# Patient Record
Sex: Female | Born: 1940 | Race: White | Hispanic: No | Marital: Married | Smoking: Former smoker
Health system: Southern US, Community
[De-identification: ages and names within clinical notes are randomized; demographics above are authoritative.]

## PROBLEM LIST (undated history)

## (undated) DIAGNOSIS — M353 Polymyalgia rheumatica: Secondary | ICD-10-CM

## (undated) DIAGNOSIS — D759 Disease of blood and blood-forming organs, unspecified: Secondary | ICD-10-CM

## (undated) DIAGNOSIS — R55 Syncope and collapse: Secondary | ICD-10-CM

## (undated) DIAGNOSIS — R5383 Other fatigue: Secondary | ICD-10-CM

## (undated) DIAGNOSIS — Z9289 Personal history of other medical treatment: Secondary | ICD-10-CM

## (undated) DIAGNOSIS — C801 Malignant (primary) neoplasm, unspecified: Secondary | ICD-10-CM

## (undated) DIAGNOSIS — K589 Irritable bowel syndrome without diarrhea: Secondary | ICD-10-CM

## (undated) DIAGNOSIS — I10 Essential (primary) hypertension: Secondary | ICD-10-CM

## (undated) DIAGNOSIS — J45909 Unspecified asthma, uncomplicated: Secondary | ICD-10-CM

## (undated) DIAGNOSIS — M109 Gout, unspecified: Secondary | ICD-10-CM

## (undated) DIAGNOSIS — F329 Major depressive disorder, single episode, unspecified: Secondary | ICD-10-CM

## (undated) DIAGNOSIS — N289 Disorder of kidney and ureter, unspecified: Secondary | ICD-10-CM

## (undated) DIAGNOSIS — M810 Age-related osteoporosis without current pathological fracture: Secondary | ICD-10-CM

## (undated) DIAGNOSIS — I2510 Atherosclerotic heart disease of native coronary artery without angina pectoris: Secondary | ICD-10-CM

## (undated) DIAGNOSIS — Z8719 Personal history of other diseases of the digestive system: Secondary | ICD-10-CM

## (undated) DIAGNOSIS — M1990 Unspecified osteoarthritis, unspecified site: Secondary | ICD-10-CM

## (undated) DIAGNOSIS — M48061 Spinal stenosis, lumbar region without neurogenic claudication: Secondary | ICD-10-CM

## (undated) DIAGNOSIS — F419 Anxiety disorder, unspecified: Secondary | ICD-10-CM

## (undated) DIAGNOSIS — J449 Chronic obstructive pulmonary disease, unspecified: Secondary | ICD-10-CM

## (undated) DIAGNOSIS — F32A Depression, unspecified: Secondary | ICD-10-CM

## (undated) DIAGNOSIS — D72810 Lymphocytopenia: Secondary | ICD-10-CM

## (undated) DIAGNOSIS — N183 Chronic kidney disease, stage 3 unspecified: Secondary | ICD-10-CM

## (undated) DIAGNOSIS — E7800 Pure hypercholesterolemia, unspecified: Secondary | ICD-10-CM

## (undated) DIAGNOSIS — K5792 Diverticulitis of intestine, part unspecified, without perforation or abscess without bleeding: Secondary | ICD-10-CM

## (undated) DIAGNOSIS — D509 Iron deficiency anemia, unspecified: Secondary | ICD-10-CM

## (undated) DIAGNOSIS — D72819 Decreased white blood cell count, unspecified: Secondary | ICD-10-CM

## (undated) DIAGNOSIS — N390 Urinary tract infection, site not specified: Secondary | ICD-10-CM

## (undated) DIAGNOSIS — K219 Gastro-esophageal reflux disease without esophagitis: Secondary | ICD-10-CM

## (undated) HISTORY — DX: Depression, unspecified: F32.A

## (undated) HISTORY — DX: Unspecified osteoarthritis, unspecified site: M19.90

## (undated) HISTORY — DX: Age-related osteoporosis without current pathological fracture: M81.0

## (undated) HISTORY — DX: Irritable bowel syndrome, unspecified: K58.9

## (undated) HISTORY — DX: Decreased white blood cell count, unspecified: D72.819

## (undated) HISTORY — DX: Diverticulitis of intestine, part unspecified, without perforation or abscess without bleeding: K57.92

## (undated) HISTORY — PX: TUBAL LIGATION: SHX77

## (undated) HISTORY — PX: HERNIA REPAIR: SHX51

## (undated) HISTORY — DX: Anxiety disorder, unspecified: F41.9

## (undated) HISTORY — PX: OTHER SURGICAL HISTORY: SHX169

## (undated) HISTORY — PX: NISSEN FUNDOPLICATION: SHX2091

## (undated) HISTORY — DX: Gout, unspecified: M10.9

## (undated) HISTORY — DX: Polymyalgia rheumatica: M35.3

## (undated) HISTORY — DX: Chronic kidney disease, stage 3 unspecified: N18.30

## (undated) HISTORY — DX: Other disorders of bilirubin metabolism: E80.6

## (undated) HISTORY — PX: COLONOSCOPY, ESOPHAGOGASTRODUODENOSCOPY (EGD) AND ESOPHAGEAL DILATION: SHX5781

## (undated) HISTORY — DX: Chronic kidney disease, stage 3 (moderate): N18.3

## (undated) HISTORY — DX: Major depressive disorder, single episode, unspecified: F32.9

## (undated) HISTORY — PX: EYE SURGERY: SHX253

## (undated) HISTORY — DX: Hypercalcemia: E83.52

## (undated) HISTORY — DX: Lymphocytopenia: D72.810

## (undated) HISTORY — PX: VAGINAL HYSTERECTOMY: SUR661

## (undated) HISTORY — DX: Chronic obstructive pulmonary disease, unspecified: J44.9

## (undated) HISTORY — DX: Other fatigue: R53.83

## (undated) HISTORY — DX: Unspecified asthma, uncomplicated: J45.909

## (undated) HISTORY — DX: Spinal stenosis, lumbar region without neurogenic claudication: M48.061

## (undated) HISTORY — DX: Essential (primary) hypertension: I10

---

## 1978-02-18 HISTORY — PX: APPENDECTOMY: SHX54

## 1993-02-18 HISTORY — PX: BREAST EXCISIONAL BIOPSY: SUR124

## 1998-06-01 ENCOUNTER — Other Ambulatory Visit: Admission: RE | Admit: 1998-06-01 | Discharge: 1998-06-01 | Payer: Self-pay | Admitting: *Deleted

## 1999-06-29 ENCOUNTER — Encounter: Payer: Self-pay | Admitting: Family Medicine

## 1999-06-29 ENCOUNTER — Encounter: Admission: RE | Admit: 1999-06-29 | Discharge: 1999-06-29 | Payer: Self-pay | Admitting: *Deleted

## 1999-08-14 ENCOUNTER — Encounter: Admission: RE | Admit: 1999-08-14 | Discharge: 1999-08-14 | Payer: Self-pay | Admitting: Family Medicine

## 1999-08-14 ENCOUNTER — Encounter: Payer: Self-pay | Admitting: Family Medicine

## 2000-08-29 ENCOUNTER — Encounter: Payer: Self-pay | Admitting: Family Medicine

## 2000-08-29 ENCOUNTER — Encounter: Admission: RE | Admit: 2000-08-29 | Discharge: 2000-08-29 | Payer: Self-pay | Admitting: Family Medicine

## 2000-09-09 ENCOUNTER — Ambulatory Visit (HOSPITAL_COMMUNITY): Admission: RE | Admit: 2000-09-09 | Discharge: 2000-09-09 | Payer: Self-pay | Admitting: *Deleted

## 2001-09-09 ENCOUNTER — Encounter: Admission: RE | Admit: 2001-09-09 | Discharge: 2001-09-09 | Payer: Self-pay | Admitting: Family Medicine

## 2001-09-09 ENCOUNTER — Encounter: Payer: Self-pay | Admitting: Family Medicine

## 2002-06-14 ENCOUNTER — Encounter: Payer: Self-pay | Admitting: Family Medicine

## 2002-06-14 ENCOUNTER — Encounter: Admission: RE | Admit: 2002-06-14 | Discharge: 2002-06-14 | Payer: Self-pay | Admitting: Family Medicine

## 2002-07-15 ENCOUNTER — Encounter: Payer: Self-pay | Admitting: Family Medicine

## 2002-07-15 ENCOUNTER — Encounter: Admission: RE | Admit: 2002-07-15 | Discharge: 2002-07-15 | Payer: Self-pay | Admitting: Family Medicine

## 2002-08-26 ENCOUNTER — Encounter: Admission: RE | Admit: 2002-08-26 | Discharge: 2002-08-26 | Payer: Self-pay | Admitting: Family Medicine

## 2002-08-26 ENCOUNTER — Encounter: Payer: Self-pay | Admitting: Family Medicine

## 2002-10-14 ENCOUNTER — Encounter: Admission: RE | Admit: 2002-10-14 | Discharge: 2002-10-14 | Payer: Self-pay | Admitting: Family Medicine

## 2002-10-14 ENCOUNTER — Encounter: Payer: Self-pay | Admitting: Family Medicine

## 2002-10-27 ENCOUNTER — Encounter: Admission: RE | Admit: 2002-10-27 | Discharge: 2002-12-15 | Payer: Self-pay | Admitting: Orthopedic Surgery

## 2003-07-01 ENCOUNTER — Encounter: Admission: RE | Admit: 2003-07-01 | Discharge: 2003-07-01 | Payer: Self-pay | Admitting: Family Medicine

## 2003-12-19 ENCOUNTER — Encounter: Admission: RE | Admit: 2003-12-19 | Discharge: 2003-12-19 | Payer: Self-pay | Admitting: Family Medicine

## 2005-02-04 ENCOUNTER — Encounter: Admission: RE | Admit: 2005-02-04 | Discharge: 2005-02-04 | Payer: Self-pay | Admitting: Family Medicine

## 2006-06-04 ENCOUNTER — Encounter: Admission: RE | Admit: 2006-06-04 | Discharge: 2006-06-04 | Payer: Self-pay | Admitting: Family Medicine

## 2006-06-18 ENCOUNTER — Ambulatory Visit (HOSPITAL_COMMUNITY): Admission: RE | Admit: 2006-06-18 | Discharge: 2006-06-18 | Payer: Self-pay | Admitting: *Deleted

## 2007-04-08 ENCOUNTER — Encounter: Admission: RE | Admit: 2007-04-08 | Discharge: 2007-04-08 | Payer: Self-pay | Admitting: Rheumatology

## 2007-07-07 ENCOUNTER — Encounter: Admission: RE | Admit: 2007-07-07 | Discharge: 2007-07-07 | Payer: Self-pay | Admitting: Family Medicine

## 2007-11-12 ENCOUNTER — Encounter: Admission: RE | Admit: 2007-11-12 | Discharge: 2007-11-12 | Payer: Self-pay | Admitting: Family Medicine

## 2008-04-04 ENCOUNTER — Encounter: Admission: RE | Admit: 2008-04-04 | Discharge: 2008-04-04 | Payer: Self-pay | Admitting: Family Medicine

## 2008-04-04 ENCOUNTER — Inpatient Hospital Stay (HOSPITAL_COMMUNITY): Admission: EM | Admit: 2008-04-04 | Discharge: 2008-04-08 | Payer: Self-pay | Admitting: Emergency Medicine

## 2008-07-28 ENCOUNTER — Encounter: Admission: RE | Admit: 2008-07-28 | Discharge: 2008-07-28 | Payer: Self-pay | Admitting: Family Medicine

## 2009-08-01 ENCOUNTER — Encounter: Admission: RE | Admit: 2009-08-01 | Discharge: 2009-08-01 | Payer: Self-pay | Admitting: Family Medicine

## 2009-11-28 ENCOUNTER — Encounter: Admission: RE | Admit: 2009-11-28 | Discharge: 2009-11-28 | Payer: Self-pay | Admitting: Family Medicine

## 2010-03-11 ENCOUNTER — Encounter: Payer: Self-pay | Admitting: Family Medicine

## 2010-06-05 LAB — COMPREHENSIVE METABOLIC PANEL
AST: 23 U/L (ref 0–37)
Albumin: 3.4 g/dL — ABNORMAL LOW (ref 3.5–5.2)
BUN: 24 mg/dL — ABNORMAL HIGH (ref 6–23)
Calcium: 8.9 mg/dL (ref 8.4–10.5)
Creatinine, Ser: 1.07 mg/dL (ref 0.4–1.2)
GFR calc Af Amer: >60 mL/min (ref >60)
GFR calc non Af Amer: 51 mL/min — ABNORMAL LOW (ref >60)
Total Bilirubin: 0.6 mg/dL (ref 0.3–1.2)

## 2010-06-05 LAB — DIFFERENTIAL
Basophils Absolute: 0 10*3/uL (ref 0.0–0.1)
Basophils Relative: 0 % (ref 0–1)
Lymphocytes Relative: 13 % (ref 12–46)
Neutro Abs: 7.6 10*3/uL (ref 1.7–7.7)
Neutrophils Relative %: 82 % — ABNORMAL HIGH (ref 43–77)

## 2010-06-05 LAB — BASIC METABOLIC PANEL
BUN: 25 mg/dL — ABNORMAL HIGH (ref 6–23)
BUN: 22 mg/dL (ref 6–23)
CO2: 25 mEq/L (ref 19–32)
Calcium: 9.6 mg/dL (ref 8.4–10.5)
Chloride: 100 mEq/L (ref 96–112)
Creatinine, Ser: 0.97 mg/dL (ref 0.4–1.2)
Creatinine, Ser: 0.94 mg/dL (ref 0.4–1.2)
GFR calc non Af Amer: 57 mL/min — ABNORMAL LOW (ref >60)
Glucose, Bld: 114 mg/dL — ABNORMAL HIGH (ref 70–99)
Sodium: 136 mEq/L (ref 135–145)

## 2010-06-05 LAB — CBC
HCT: 31.6 % — ABNORMAL LOW (ref 36.0–46.0)
Hemoglobin: 12.9 g/dL (ref 12.0–15.0)
MCHC: 35 g/dL (ref 30.0–36.0)
MCHC: 35.4 g/dL (ref 30.0–36.0)
MCV: 96.9 fL (ref 78.0–100.0)
Platelets: 265 10*3/uL (ref 150–400)
Platelets: 219 10*3/uL (ref 150–400)
RDW: 13.6 % (ref 11.5–15.5)

## 2010-06-05 LAB — POCT CARDIAC MARKERS
CKMB, poc: <1 ng/mL — ABNORMAL LOW (ref 1.0–8.0)
Myoglobin, poc: 87.8 ng/mL (ref 12–200)
Troponin i, poc: <0.05 ng/mL (ref 0.00–0.09)

## 2010-06-05 LAB — APTT: aPTT: 31 seconds (ref 24–37)

## 2010-06-05 LAB — LIPID PANEL
LDL Cholesterol: 123 mg/dL — ABNORMAL HIGH (ref 0–99)
Triglycerides: 133 mg/dL (ref <150)
VLDL: 27 mg/dL (ref 0–40)

## 2010-06-05 LAB — HEMOGLOBIN A1C: Mean Plasma Glucose: 105 mg/dL

## 2010-06-05 LAB — BRAIN NATRIURETIC PEPTIDE: Pro B Natriuretic peptide (BNP): <30 pg/mL (ref 0.0–100.0)

## 2010-06-05 LAB — PROTIME-INR
INR: 1.1 (ref 0.00–1.49)
Prothrombin Time: 14.2 seconds (ref 11.6–15.2)

## 2010-06-13 ENCOUNTER — Other Ambulatory Visit: Payer: Self-pay | Admitting: Family Medicine

## 2010-06-13 DIAGNOSIS — M79604 Pain in right leg: Secondary | ICD-10-CM

## 2010-06-20 ENCOUNTER — Ambulatory Visit
Admission: RE | Admit: 2010-06-20 | Discharge: 2010-06-20 | Disposition: A | Discharge status: Home / Self Care | Payer: Medicare Other | Source: Ambulatory Visit | Attending: Family Medicine | Admitting: Family Medicine

## 2010-06-20 DIAGNOSIS — M79604 Pain in right leg: Secondary | ICD-10-CM

## 2010-06-29 ENCOUNTER — Other Ambulatory Visit: Payer: Self-pay | Admitting: Orthopedic Surgery

## 2010-06-29 DIAGNOSIS — M48061 Spinal stenosis, lumbar region without neurogenic claudication: Secondary | ICD-10-CM

## 2010-07-03 NOTE — Discharge Summary (Signed)
NAMEAMAL, RENBARGER             ACCOUNT NO.:  [id number]   MEDICAL RECORD NO.:  [id number]          PATIENT TYPE:  INP   LOCATION:  6524                         FACILITY:  [facility]   PHYSICIAN:  Hollice Espy, M.D.DATE OF BIRTH:  1940-07-25   DATE OF ADMISSION:  04/04/2008  DATE OF DISCHARGE:  04/08/2008                               DISCHARGE SUMMARY   PCP:  Dr. Aundra Dubin. Revankar, MD   DISCHARGE DIAGNOSES:  1. Chronic obstructive pulmonary disease exacerbation.  2. Hypoxia, now resolved.  3. Hypertension.  4. Depression.   DISCHARGE MEDICATIONS:  New medicines for this patient:  1. Prednisone 50 mg p.o. xday 1, 40 mg p.o. xday 2, and decrease by 10      mg until finished after five days.  2. Avelox 400 mg x5 days.   The patient will also continue her previous medications, and these are  as follows:  1. Advair 1 puff b.i.d.  2. Aspirin 81 mg p.o. daily.  3. Citalopram 20 p.o. daily.  4. Lisinopril/hydrochlorothiazide 10/12.5 daily.  5. Nabumetone 150 two times a day.  6. Prilosec 20 mg daily.   DISCHARGE DIET:  Low sodium.   DISPOSITION:  Improved.   ACTIVITIES:  Slow to increase.  The patient will be going home with home  health, PT/OT, and RN.   HOSPITAL COURSE:  The patient is a 70 year old white female with past  medical history of COPD and hypertension who presented with worsening  shortness of breath and cough.  Chest x-ray showed no evidence of any  infiltrate, but the patient was markedly hypoxic.  She was started on IV  antibiotics, nebulizer treatments, oxygen, and steroids.  Over the next  several days, she had dramatic improvement, and her steroids was able to  be changed over to p.o.  Her antibiotics were able to be changed over to  p.o., and by April 08, 2008, she was noted to be sating 95% on room  air.  She was still feeling very weak, and the plan will be to set up  with home health, PT/OT as well as an Char[employer] fundraiser for safety evaluation.   In regards  to her hypertension, these medications were continued and she  remained stable.  She will follow up with her PCP, Dr. Tomie China in the  next week.      Hollice Espy, [name]  Electronically Signed     SKK/MEDQ  D:  04/08/2008  T:  04/09/2008  Job:  [id number]   cc:   Aundra Dubin. Revankar, [name]

## 2010-07-03 NOTE — H&P (Signed)
Nicole Meyer, Nicole Meyer             ACCOUNT NO.:  [id number]   MEDICAL RECORD NO.:  [id number]          PATIENT TYPE:  INP   LOCATION:  1843                         FACILITY:  [facility]   PHYSICIAN:  Michiel Cowboy, MDDATE OF BIRTH:  1940/08/23   DATE OF ADMISSION:  04/04/2008  DATE OF DISCHARGE:                              HISTORY & PHYSICAL   PRIMARY CARE PHYSICIAN:  Bryan Lemma. Ehinger, [name]   CHIEF COMPLAINT:  Shortness of  breath and wheezing.  The patient is a  70 year old female with known history of COPD and hypertension who was  at her baseline up until about a week ago when she started developing  progressive wheezing and coughing, dry cough, no fevers, no chills, but  she was getting more and more significantly short of breath.  She went  to see Dr. Manus Gunning at least twice.  He started her on prednisone taper  and wrote for some antibiotics which she never filled, but she continues  to do poorly which brought her in to the emergency department where she  received nebulizer treatment with some improvement but not significant  enough to make her feel much better.  At this point [facility] hospitalists  was called for admission.   PAST MEDICAL HISTORY:  1. Significant for COPD.  2. Hypertension.  3. Mild depression.   SOCIAL HISTORY:  The patient used to smoke but quit about five years  ago.  Does not drink alcohol, does not abuse drugs.  Lives at home with  her husband who currently stopped smoking himself.   FAMILY HISTORY:  Significant for coronary artery disease and the recent  death of her father at age of 25 from MI.   ALLERGIES:  PENICILLIN.   MEDICATIONS:  1. Advair HFA 115/21 b.i.d.  2. Aspirin 81 mg daily.  3. Citalopram 20 mg daily.  4. Lisinopril/hydrochlorothiazide 10/12.5 mg p.o. daily.  5. Nabumetone 150 mg b.i.d.  6. Prilosec 20 daily.  7. The patient was recently started on prednisone taper 2 mg daily.   PHYSICAL EXAMINATION:  VITAL SIGNS:   Temperature 97, blood pressure  198/104, now down to 143/73, heart rate 84, respirations 20, saturating  96% on 2 L.  GENERAL:  The patient appears to be in no acute distress currently,  breathing and speaking in full sentences.  HEENT:  Head nontraumatic.  Slightly dry mucous membranes.  LUNGS:  Significant for expiratory coarse wheezes.  No crackles noted.  HEART:  Somewhat rapid currently.  The patient just finished a nebulizer  treatment.  No murmurs could be appreciated.  ABDOMEN:  Obese but nontender, nondistended.  EXTREMITIES:  Lower extremities without clubbing, cyanosis, or edema.  NEUROLOGIC:  Neurologically intact.   LABORATORY DATA:  White blood cell count 9.3, hemoglobin 12.9.  Sodium  136, potassium 4.5, creatinine 0.97.  Cardiac markers negative per I-  Stat.   Chest x-ray showed hiatal hernia but no cardiac pulmonary disease.  EKG  showed normal sinus rhythm with slightly flat T-waves in V2, V4, and V5.   ASSESSMENT/PLAN:  This is a 70 year old female with history of COPD,  apparently COPD  exacerbation.  Will continue with Solu-Medrol until  wheezing improves.  Put her on Avelox during duration of her symptoms.  Will make sure she is on Xopenex since Albuterol makes her tachycardic,  Xopenex p.r.n., and Atrovent.  Continue Advair at home dose.  1. History of hypertension.  The patient appears to be slightly on the      dry side.  Hold her hydrochlorothiazide.  Will continue lisinopril.      Give hydralazine p.r.n. severe blood pressure.  2. Depression.  Continue citalopram.  3. Shortness of breath.  Likely chronic obstructive pulmonary disease      related but to be complete we will obtain D-dimer.  4. Prophylaxis.  Protonix plus Lovenox.      Michiel Cowboy, MD  Electronically Signed     AVD/MEDQ  D:  04/04/2008  T:  04/04/2008  Job:  [id number]   cc:   Bryan Lemma. Manus Gunning, [name]

## 2010-07-04 ENCOUNTER — Ambulatory Visit
Admission: RE | Admit: 2010-07-04 | Discharge: 2010-07-04 | Disposition: A | Discharge status: Home / Self Care | Payer: Medicare Other | Source: Ambulatory Visit | Attending: Orthopedic Surgery | Admitting: Orthopedic Surgery

## 2010-07-04 DIAGNOSIS — M48061 Spinal stenosis, lumbar region without neurogenic claudication: Secondary | ICD-10-CM

## 2010-07-06 NOTE — Cardiovascular Report (Signed)
[place]  Patient:    Nicole Meyer, Nicole Meyer                      MRN: [id number] Proc. Date: 09/09/00 Attending:  Myriam Jacobson A. Fraser Din, [name] CC:         Dyanne Carrel, [name], [facility]   Cardiac Catheterization   REFERRING PHYSICIAN:  Dyanne Carrel, [name]  INDICATIONS FOR PROCEDURE:  Chest pain with an abnormal Cardiolite.  DESCRIPTION OF PROCEDURE:  After obtaining written informed consent, the patient was brought to the cardiac catheterization lab in the postabsorptive state.  Preoperative sedation was achieved using Valium p.o.[name]  The right groin was prepped and draped in a usual sterile fashion.  Local anesthesia was achieved using 1% Xylocaine.  A 6 French hemostasis sheath was placed into the right femoral artery using a modified Seldinger technique.  Selective coronary angiography was performed using a JL4, JR4 Judkins catheter.  Nonionic contrast was used.  All catheter exchange were made over a guide wire. following the procedure there was no identifiable coronary artery disease. The patient was transferred to the holding area.  Hemostasis was achieved using digital pressure.  FINDINGS:  Aortic pressure is 148/80, LV pressure is 147/19.  There was no gradient noted on pullback.  Single plane ventriculogram revealed normal wall motion with an ejection fraction of 60%.  There was no mitral regurgitation noted.  CORONARY ANGIOGRAPHY:  There was no significant calcification.  The left main coronary artery bifurcation into the left anterior descending and circumflex vessel.  There was no significant disease in the left main coronary artery.  Left anterior descending:  The left anterior descending gave rise to a moderate diagonal #1 and ended as an apical recurrent branch.  There was no significant disease in the left anterior descending or its branches.  Circumflex vessel:  The circumflex vessel gave rise to a small  OM-1, large bifurcating OM-2 and a OM-3.  There was no significant disease in the circumflex or its branches.  Right coronary artery:  The right coronary artery was a large dominant artery that gave rise to two small RV marginals, PA and PL branch.  IMPRESSION:  Normal coronary angiography.  Normal single plane ventriculogram. False-positive stress test.  Other etiologies will be considered for her chest pain. DD:  09/09/00 TD:  09/09/00 Job: [id number]

## 2010-07-25 ENCOUNTER — Other Ambulatory Visit: Payer: Self-pay | Admitting: Family Medicine

## 2010-07-25 DIAGNOSIS — Z1231 Encounter for screening mammogram for malignant neoplasm of breast: Secondary | ICD-10-CM

## 2010-08-08 ENCOUNTER — Ambulatory Visit
Admission: RE | Admit: 2010-08-08 | Discharge: 2010-08-08 | Disposition: A | Discharge status: Home / Self Care | Payer: Medicare Other | Source: Ambulatory Visit | Attending: Family Medicine | Admitting: Family Medicine

## 2010-08-08 DIAGNOSIS — Z1231 Encounter for screening mammogram for malignant neoplasm of breast: Secondary | ICD-10-CM

## 2011-07-16 ENCOUNTER — Other Ambulatory Visit: Payer: Self-pay | Admitting: Family Medicine

## 2011-07-16 DIAGNOSIS — Z1231 Encounter for screening mammogram for malignant neoplasm of breast: Secondary | ICD-10-CM

## 2011-08-09 ENCOUNTER — Ambulatory Visit
Admission: RE | Admit: 2011-08-09 | Discharge: 2011-08-09 | Disposition: A | Discharge status: Home / Self Care | Payer: Medicare Other | Source: Ambulatory Visit | Attending: Family Medicine | Admitting: Family Medicine

## 2011-08-09 DIAGNOSIS — Z1231 Encounter for screening mammogram for malignant neoplasm of breast: Secondary | ICD-10-CM

## 2011-08-12 ENCOUNTER — Telehealth: Payer: Self-pay | Admitting: Oncology

## 2011-08-12 NOTE — Telephone Encounter (Signed)
called pts home scheduled appt for 07/08. faxed over aq letter to Dr. Manus Gunning with appt d/t

## 2011-08-13 ENCOUNTER — Telehealth: Payer: Self-pay | Admitting: Oncology

## 2011-08-13 NOTE — Telephone Encounter (Signed)
Referred by Dr. Blair Heys Dx- Anemia/Leukocytosis

## 2011-08-23 ENCOUNTER — Encounter: Payer: Self-pay | Admitting: Oncology

## 2011-08-23 DIAGNOSIS — D72810 Lymphocytopenia: Secondary | ICD-10-CM

## 2011-08-23 DIAGNOSIS — J45909 Unspecified asthma, uncomplicated: Secondary | ICD-10-CM | POA: Insufficient documentation

## 2011-08-23 DIAGNOSIS — R5382 Chronic fatigue, unspecified: Secondary | ICD-10-CM | POA: Insufficient documentation

## 2011-08-23 DIAGNOSIS — M1990 Unspecified osteoarthritis, unspecified site: Secondary | ICD-10-CM | POA: Insufficient documentation

## 2011-08-23 DIAGNOSIS — D589 Hereditary hemolytic anemia, unspecified: Secondary | ICD-10-CM | POA: Insufficient documentation

## 2011-08-23 DIAGNOSIS — K589 Irritable bowel syndrome without diarrhea: Secondary | ICD-10-CM | POA: Insufficient documentation

## 2011-08-23 DIAGNOSIS — D72819 Decreased white blood cell count, unspecified: Secondary | ICD-10-CM | POA: Insufficient documentation

## 2011-08-23 DIAGNOSIS — F419 Anxiety disorder, unspecified: Secondary | ICD-10-CM | POA: Insufficient documentation

## 2011-08-23 DIAGNOSIS — I10 Essential (primary) hypertension: Secondary | ICD-10-CM | POA: Insufficient documentation

## 2011-08-23 HISTORY — DX: Lymphocytopenia: D72.810

## 2011-08-26 ENCOUNTER — Other Ambulatory Visit (HOSPITAL_BASED_OUTPATIENT_CLINIC_OR_DEPARTMENT_OTHER): Payer: Medicare Other | Admitting: Lab

## 2011-08-26 ENCOUNTER — Ambulatory Visit: Payer: Medicare Other

## 2011-08-26 ENCOUNTER — Ambulatory Visit (HOSPITAL_BASED_OUTPATIENT_CLINIC_OR_DEPARTMENT_OTHER): Payer: Medicare Other | Admitting: Oncology

## 2011-08-26 ENCOUNTER — Encounter: Payer: Self-pay | Admitting: Oncology

## 2011-08-26 VITALS — BP 155/66 | HR 79 | Temp 97.8°F | Ht 63.0 in | Wt 197.6 lb

## 2011-08-26 DIAGNOSIS — R5383 Other fatigue: Secondary | ICD-10-CM

## 2011-08-26 DIAGNOSIS — I10 Essential (primary) hypertension: Secondary | ICD-10-CM

## 2011-08-26 DIAGNOSIS — D72810 Lymphocytopenia: Secondary | ICD-10-CM

## 2011-08-26 DIAGNOSIS — F419 Anxiety disorder, unspecified: Secondary | ICD-10-CM

## 2011-08-26 DIAGNOSIS — J45909 Unspecified asthma, uncomplicated: Secondary | ICD-10-CM

## 2011-08-26 DIAGNOSIS — D649 Anemia, unspecified: Secondary | ICD-10-CM

## 2011-08-26 DIAGNOSIS — D72819 Decreased white blood cell count, unspecified: Secondary | ICD-10-CM

## 2011-08-26 DIAGNOSIS — M1990 Unspecified osteoarthritis, unspecified site: Secondary | ICD-10-CM

## 2011-08-26 DIAGNOSIS — K589 Irritable bowel syndrome without diarrhea: Secondary | ICD-10-CM

## 2011-08-26 LAB — CBC & DIFF AND RETIC
BASO%: 0.5 % (ref 0.0–2.0)
HCT: 27.1 % — ABNORMAL LOW (ref 34.8–46.6)
LYMPH%: 17.8 % (ref 14.0–49.7)
MCH: 33.1 pg (ref 25.1–34.0)
MCHC: 32.8 g/dL (ref 31.5–36.0)
MCV: 100.7 fL (ref 79.5–101.0)
MONO%: 7.7 % (ref 0.0–14.0)
NEUT%: 68.2 % (ref 38.4–76.8)
Platelets: 209 10*3/uL (ref 145–400)
RBC: 2.69 10*6/uL — ABNORMAL LOW (ref 3.70–5.45)
Retic %: 6.98 % — ABNORMAL HIGH (ref 0.70–2.10)

## 2011-08-26 LAB — MORPHOLOGY: PLT EST: ADEQUATE

## 2011-08-26 NOTE — Patient Instructions (Signed)
1.  Issues:  Anemia and leukopenia (low red blood cell and white blood cell). 2.  Potential causes:  Iron deficiency, Vit B12 deficiency, hemolysis (breakage of red blood cells).   Cannot rule out an early bone marrow process. 3.  Work up:  Pending iron, Vit B12 levels and others.  If blood tests do not give an obvious explanation for your anemia, we may need to perform bone marrow biopsy.

## 2011-08-26 NOTE — Progress Notes (Signed)
[facility]  [phone] [phone]     INITIAL HEMATOLOGY CONSULTATION    Referral MD:  Dr. Blair Heys, [name]   Reason for Referral:  Normocytic anemia.     HPI:  [name] is a 89 year woman with history of HTN, IBS, COPD from past smoking.  She has been noted to have worsening anemia over the past 3 years.  Her oldest CBC available on EPIC dated back to 04/04/2008 which showed Hgb of 12.9.  On 07/15/2011, her Hgb ws 11.4.  On 08/08/2011, her Hgb was 8.8.  Her last colonoscopy with [facility] was within 3 years ago and was grossly negative per report. I am awaiting the formal report. She also had recent stool Guaiac cards that were negative.  Given the persistent normocytic anemia, she was kindly referred to the cancer Center for evaluation.  Nicole Meyer presented to clinic today with her husband.  She reports mild fatigue that is been going on for many years which has not exacerbated lately. She also has dyspnea exertion source of breath, COPD. This symptoms improve with inhalers or NEB treatment at home.  She denied all visible source of bleeding. She has chronic venous stasis ulcer in her bilateral ankles.  She has chronic bone pain in the low back which has been ongoing for many years which has been attributed to osteoarthritis her rheumatologist.  Patient denies fever, anorexia, weight loss, headache, visual changes, confusion, drenching night sweats, palpable lymph node swelling, mucositis, odynophagia, dysphagia, nausea vomiting, jaundice, chest pain, palpitation, productive cough, gum bleeding, epistaxis, hematemesis, hemoptysis, abdominal pain, abdominal swelling, early satiety, melena, hematochezia, hematuria, skin rash, spontaneous bleeding, joint swelling, joint pain, heat or cold intolerance, bowel bladder incontinence, back pain, focal motor weakness, paresthesia, depression, suicidal or homocidal ideation, feeling  hopelessness.  Past Medical History  Diagnosis Date  . Anemia   . Leukocytopenia   . HTN (hypertension)   . Asthma   . IBS (irritable bowel syndrome)   . Arthritis   . Anxiety   . Depression   . Chronic kidney disease (CKD), stage III (moderate)   . Hypercalcemia   . Fatigue   . Lymphopenia 08/23/2011  . COPD (chronic obstructive pulmonary disease)   :    Past Surgical History  Procedure Date  . Abdominal hysterectomy   :   CURRENT MEDS: Current Outpatient Prescriptions  Medication Sig Dispense Refill  . albuterol (PROVENTIL) (2.5 MG/3ML) 0.083% nebulizer solution Take 2.5 mg by nebulization every 6 (six) hours as needed.      [name] aspirin 81 MG tablet Take 81 mg by mouth daily.      . budesonide (PULMICORT) 0.5 MG/2ML nebulizer solution Take 0.5 mg by nebulization 2 (two) times daily.      . Calcium Carb-Cholecalciferol (CALCIUM + D3) 600-200 MG-UNIT TABS Take 1 tablet by mouth daily.      . citalopram (CELEXA) 20 MG tablet Take 20 mg by mouth daily.      . diclofenac (VOLTAREN) 75 MG EC tablet Take 75 mg by mouth 2 (two) times daily.      [name] lisinopril-hydrochlorothiazide (PRINZIDE,ZESTORETIC) 20-12.5 MG per tablet Take 1 tablet by mouth daily.      . Multiple Vitamin (MULTIVITAMIN) tablet Take 1 tablet by mouth daily.      [name] omeprazole (PRILOSEC) 20 MG capsule Take 20 mg by mouth daily.          Allergies  Allergen Reactions  .  Brovana (Arformoterol) Other (See Comments)  . Penicillins Hives  . Paxil (Paroxetine Hcl) Rash  :  Family History  Problem Relation Age of Onset  . Diabetes Father   . Heart disease Father   . Cancer Maternal Aunt     Breast cancer  :  History   Social History  . Marital Status: Married    Spouse Name: N/A    Number of Children: 2  . Years of Education: N/A   Occupational History  .      retired Armed forces training and education officer   Social History Main Topics  . Smoking status: Former Smoker -- 1.0 packs/day for 40 years    Quit date:  02/19/2000  . Smokeless tobacco: Not on file  . Alcohol Use: No  . Drug Use: No  . Sexually Active:    Other Topics Concern  . Not on file   Social History Narrative  . No narrative on file  :  REVIEW OF SYSTEM:  The rest of the 14-point review of sytem was negative.   Exam: ECOG 1-2  General:  well-nourished in no acute distress.  Eyes:  no scleral icterus.  ENT:  There were no oropharyngeal lesions.  Neck was without thyromegaly.  Lymphatics:  Negative cervical, supraclavicular or axillary adenopathy.  Respiratory: lungs were clear bilaterally without wheezing or crackles.  Cardiovascular:  Regular rate and rhythm, S1/S2, without murmur, rub or gallop.  There was no pedal edema.  GI:  abdomen was soft, flat, nontender, nondistended, without organomegaly. She deferred rectal exam due to recent stool cards with her PCP which were all negative. Muscoloskeletal:  no spinal tenderness of palpation of vertebral spine.  Skin exam was without echymosis, petichae.  Neuro exam was nonfocal.  Patient was able to get on and off exam table without assistance.  Gait was normal.  Patient was alerted and oriented.  Attention was good.   Language was appropriate.  Mood was normal without depression.  Speech was not pressured.  Thought content was not tangential.    LABS:  Lab Results  Component Value Date   WBC 3.7* 08/26/2011   HGB 8.9* 08/26/2011   HCT 27.1* 08/26/2011   PLT 209 08/26/2011   GLUCOSE 151* 04/07/2008   CHOL  Value: 188        ATP III CLASSIFICATION:  <200     mg/dL   Desirable  161-096  mg/dL   Borderline High  >=045    mg/dL   High        05/27/8117   TRIG 133 04/05/2008   HDL 38* 04/05/2008   LDLCALC  Value: 123        Total Cholesterol/HDL:CHD Risk Coronary Heart Disease Risk Table                     Men   Women  1/2 Average Risk   3.4   3.3  Average Risk       5.0   4.4  2 X Average Risk   9.6   7.1  3 X Average Risk  23.4   11.0        Use the calculated Patient Ratio above and the CHD  Risk Table to determine the patient's CHD Risk.        ATP III CLASSIFICATION (LDL):  <100     mg/dL   Optimal  147-829  mg/dL   Near or Above  Optimal  130-159  mg/dL   Borderline  161-096  mg/dL   High  >045     mg/dL   Very High* 05/27/8117   ALT 23 04/05/2008   AST 23 04/05/2008   NA 134* 04/07/2008   K 4.5 04/07/2008   CL 100 04/07/2008   CREATININE 0.94 04/07/2008   BUN 22 04/07/2008   CO2 25 04/07/2008   INR 1.1 04/05/2008   HGBA1C  Value: 5.3 (NOTE)   The ADA recommends the following therapeutic goal for glycemic   control related to Hgb A1C measurement:   Goal of Therapy:   < 7.0% Hgb A1C   Reference: American Diabetes Association: Clinical Practice   Recommendations 2008, Diabetes Care,  2008, 31:(Suppl 1). 04/05/2008    Blood smear review:   I personally reviewed the patient's peripheral blood smear today.  There was anisocytosis.  There was no peripheral blast.  There was no schistocytosis, spherocytosis, target cell, rouleaux formation, tear drop cell.  There was no nucleated RBC but slight increase in hyperchromasia.  There was no giant platelets or platelet clumps.      ASSESSMENT AND PLAN:   1.  COPD:  From past smoking, none now.  Her COPD is well controlled on inhalers per PCP.   2.  Depression/axiety:  Well controlled on Celexa per PCP.   3.  Hypertension:  Slightly elevated BP today; but it was the first time she presented at the [facility].  She is on Lisinopril/HCTZ per PCP.   4.  Leukopenia/lymphopenia:  Very slight without neutropenia and recurrent infection.  I sent for HIV today.  Other work up as with anemia.  Please see next item.  5.  Normocytic anemia with reportedly negative GI work up. - Differential:  Vit B12 in combination with iron deficiency.  Even though her retic was slightly elevated, she did not have jaundice.  I sent for LDH, hemolysis work up.  Most likely she does not have pure red cell aplasia with appropriately elevated  reticulocytosis.  I cannot rule out myeloma and primary bone marrow process such as MDS.  - Work up:  I am ruling out reversible causes of anemia with iron, VitB12, hemolysis work up, myeloma.  If these are all negative, I strongly recommended bone marrow biopsy given leukopenia and anemia to rule out MDS.   Nicole Meyer and her husband expressed informed understanding and wished to proceed as recommended.     Thank you for this referral.   The length of time of the face-to-face encounter was 45 minutes. More than 50% of time was spent counseling and coordination of care.

## 2011-08-28 LAB — BASIC METABOLIC PANEL
CO2: 24 mEq/L (ref 19–32)
Calcium: 9 mg/dL (ref 8.4–10.5)
Creatinine, Ser: 0.98 mg/dL (ref 0.50–1.10)
Sodium: 137 mEq/L (ref 135–145)

## 2011-08-28 LAB — PROTEIN ELECTROPHORESIS, SERUM
Albumin ELP: 54.4 % — ABNORMAL LOW (ref 55.8–66.1)
Alpha-1-Globulin: 4.8 % (ref 2.9–4.9)
Beta 2: 5.2 % (ref 3.2–6.5)
Total Protein, Serum Electrophoresis: 6.8 g/dL (ref 6.0–8.3)

## 2011-08-28 LAB — KAPPA/LAMBDA LIGHT CHAINS
Kappa free light chain: 3.23 mg/dL — ABNORMAL HIGH (ref 0.33–1.94)
Kappa:Lambda Ratio: 1.08 (ref 0.26–1.65)
Lambda Free Lght Chn: 2.98 mg/dL — ABNORMAL HIGH (ref 0.57–2.63)

## 2011-08-28 LAB — IRON AND TIBC: Iron: 100 ug/dL (ref 42–145)

## 2011-08-28 LAB — LACTATE DEHYDROGENASE: LDH: 180 U/L (ref 94–250)

## 2011-08-28 LAB — FERRITIN: Ferritin: 84 ng/mL (ref 10–291)

## 2011-08-29 ENCOUNTER — Telehealth: Payer: Self-pay | Admitting: Oncology

## 2011-08-29 ENCOUNTER — Telehealth: Payer: Self-pay | Admitting: *Deleted

## 2011-08-29 ENCOUNTER — Other Ambulatory Visit: Payer: Self-pay | Admitting: Oncology

## 2011-08-29 ENCOUNTER — Encounter: Payer: Self-pay | Admitting: Oncology

## 2011-08-29 DIAGNOSIS — D72819 Decreased white blood cell count, unspecified: Secondary | ICD-10-CM

## 2011-08-29 DIAGNOSIS — D649 Anemia, unspecified: Secondary | ICD-10-CM

## 2011-08-29 NOTE — Telephone Encounter (Signed)
Pt called states returning call about scheduling a bone marrow biopsy.  Instructed pt to call Radiology dept tomorrow regarding setting that appt was ordered to be done in IR and call Dr. Lodema Pilot nurse back if any problems. She verbalized understanding.

## 2011-08-29 NOTE — Telephone Encounter (Signed)
I called and discussed with patient result of her anemia work up.  So far, none of the blood test showed an obvious explanation for her anemia and mild leukopenia.  I strongly recommended diagnostic bone marrow biopsy to rule out MDS and other bone marrow failure-related pathology.  Nicole Meyer expressed informed understanding and preferred to have this done by IR.  I made the referral to IR today for within 2 weeks.

## 2011-08-29 NOTE — Progress Notes (Signed)
Patient and husband came by today to pick up EPP Application patient will either mail back in or bring back by.

## 2011-08-30 ENCOUNTER — Other Ambulatory Visit: Payer: Self-pay | Admitting: Radiology

## 2011-08-30 ENCOUNTER — Telehealth: Payer: Self-pay | Admitting: Oncology

## 2011-08-30 NOTE — Telephone Encounter (Signed)
S/w tina and she stated that the pt is aware of the bone marrow appt

## 2011-09-02 ENCOUNTER — Encounter (HOSPITAL_COMMUNITY): Payer: Self-pay | Admitting: Pharmacy Technician

## 2011-09-03 ENCOUNTER — Encounter (HOSPITAL_COMMUNITY): Payer: Self-pay

## 2011-09-03 ENCOUNTER — Ambulatory Visit (HOSPITAL_COMMUNITY)
Admission: RE | Admit: 2011-09-03 | Discharge: 2011-09-03 | Disposition: A | Discharge status: Home / Self Care | Payer: Medicare Other | Source: Ambulatory Visit | Attending: Oncology | Admitting: Oncology

## 2011-09-03 DIAGNOSIS — D72819 Decreased white blood cell count, unspecified: Secondary | ICD-10-CM | POA: Insufficient documentation

## 2011-09-03 DIAGNOSIS — D649 Anemia, unspecified: Secondary | ICD-10-CM | POA: Insufficient documentation

## 2011-09-03 LAB — APTT: aPTT: 41 seconds — ABNORMAL HIGH (ref 24–37)

## 2011-09-03 LAB — CBC
MCHC: 34 g/dL (ref 30.0–36.0)
Platelets: 249 10*3/uL (ref 150–400)
RDW: 14.2 % (ref 11.5–15.5)
WBC: 4.2 10*3/uL (ref 4.0–10.5)

## 2011-09-03 LAB — PROTIME-INR: INR: 1.01 (ref 0.00–1.49)

## 2011-09-03 MED ORDER — FENTANYL CITRATE 0.05 MG/ML IJ SOLN
INTRAMUSCULAR | Status: AC
  Filled 2011-09-03: qty 6

## 2011-09-03 MED ORDER — HYDROCODONE-ACETAMINOPHEN 5-325 MG PO TABS
ORAL_TABLET | ORAL | Status: AC
  Administered 2011-09-03: 12:00:00
  Filled 2011-09-03: qty 1

## 2011-09-03 MED ORDER — MIDAZOLAM HCL 5 MG/5ML IJ SOLN
INTRAMUSCULAR | Status: AC | PRN
  Administered 2011-09-03: 2 mg via INTRAVENOUS

## 2011-09-03 MED ORDER — FENTANYL CITRATE 0.05 MG/ML IJ SOLN
INTRAMUSCULAR | Status: AC | PRN
  Administered 2011-09-03 (×2): 50 ug via INTRAVENOUS

## 2011-09-03 MED ORDER — HYDROCODONE-ACETAMINOPHEN 5-325 MG PO TABS
1.0000 | ORAL_TABLET | ORAL | Status: DC | PRN
  Filled 2011-09-03: qty 2

## 2011-09-03 MED ORDER — MIDAZOLAM HCL 2 MG/2ML IJ SOLN
INTRAMUSCULAR | Status: AC
  Filled 2011-09-03: qty 2

## 2011-09-03 MED ORDER — MIDAZOLAM HCL 2 MG/2ML IJ SOLN
INTRAMUSCULAR | Status: AC
  Filled 2011-09-03: qty 4

## 2011-09-03 MED ORDER — SODIUM CHLORIDE 0.9 % IV SOLN: Freq: Once | INTRAVENOUS | Status: DC

## 2011-09-03 NOTE — H&P (Signed)
Nicole Meyer is an 71 y.o. female.   Chief Complaint: worsening anemia x 2- 3 yrs Scheduled now for Bone Marrow Biopsy HPI: Leukocytopenia; HTN; COPD  Past Medical History  Diagnosis Date  . Anemia   . Leukocytopenia   . HTN (hypertension)   . Asthma   . IBS (irritable bowel syndrome)   . Arthritis   . Anxiety   . Depression   . Chronic kidney disease (CKD), stage III (moderate)   . Hypercalcemia   . Fatigue   . Lymphopenia 08/23/2011  . COPD (chronic obstructive pulmonary disease)     Past Surgical History  Procedure Date  . Abdominal hysterectomy     Family History  Problem Relation Age of Onset  . Diabetes Father   . Heart disease Father   . Cancer Maternal Aunt     Breast cancer   Social History:  reports that she quit smoking about 11 years ago. She does not have any smokeless tobacco history on file. She reports that she does not drink alcohol or use illicit drugs.  Allergies:  Allergies  Allergen Reactions  . Brovana (Arformoterol) Other (See Comments)  . Penicillins Hives  . Paxil (Paroxetine Hcl) Rash     (Not in a hospital admission)  Results for orders placed during the hospital encounter of 09/03/11 (from the past 48 hour(s))  APTT     Status: Abnormal   Collection Time   09/03/11  7:29 AM      Component Value Range Comment   aPTT 41 (*) 24 - 37 seconds   CBC     Status: Abnormal   Collection Time   09/03/11  7:29 AM      Component Value Range Comment   WBC 4.2  4.0 - 10.5 K/uL    RBC 2.91 (*) 3.87 - 5.11 MIL/uL    Hemoglobin 10.0 (*) 12.0 - 15.0 g/dL    HCT 13.0 (*) 86.5 - 46.0 %    MCV 101.0 (*) 78.0 - 100.0 fL    MCH 34.4 (*) 26.0 - 34.0 pg    MCHC 34.0  30.0 - 36.0 g/dL    RDW 78.4  69.6 - 29.5 %    Platelets 249  150 - 400 K/uL   PROTIME-INR     Status: Normal   Collection Time   09/03/11  7:29 AM      Component Value Range Comment   Prothrombin Time 13.5  11.6 - 15.2 seconds    INR 1.01  0.00 - 1.49    No results  found.  Review of Systems  Constitutional: Negative for fever.  Respiratory: Negative for shortness of breath.   Gastrointestinal: Negative for nausea and vomiting.  Neurological: Negative for headaches.  Endo/Heme/Allergies: Bruises/bleeds easily.    Blood pressure 150/72, pulse 76, temperature 97.8 F (36.6 C), temperature source Oral, resp. rate 19, height 5' 2.5" (1.588 m), weight 197 lb (89.359 kg), SpO2 97.00%. Physical Exam  Constitutional: She is oriented to person, place, and time. She appears well-developed and well-nourished.  Cardiovascular: Normal rate, regular rhythm and normal heart sounds.   No murmur heard. Respiratory: Effort normal and breath sounds normal. She has no wheezes.  GI: Soft. Bowel sounds are normal. There is no tenderness.  Musculoskeletal: Normal range of motion.  Neurological: She is alert and oriented to person, place, and time.  Skin: Skin is warm and dry.  Psychiatric: She has a normal mood and affect. Her behavior is normal. Judgment and thought content  normal.     Assessment/Plan Worsening anemia x 2-3 yrs Scheduled now for BM Bx Pt and family aware of procedure benefits and risks and agreeable to proceed. Consent signed.  Jru Pense A 09/03/2011, 8:37 AM

## 2011-09-03 NOTE — Progress Notes (Signed)
Charted pac access on this pt in error  Have erased it to the best of my ability  Pt did not have pac

## 2011-09-03 NOTE — Procedures (Signed)
Interventional Radiology Procedure Note  Procedure: CT guided bone marrow biopsy, right iliac Complications: No immediate Recommendations: Supine bed rest x 2 hrs, then progress to full ambulation.  Keep bandage on 48 hrs.  Signed,  Sterling Big, MD Vascular & Interventional Radiologist [facility] Radiology

## 2011-09-04 ENCOUNTER — Encounter: Payer: Self-pay | Admitting: Oncology

## 2011-09-04 NOTE — Progress Notes (Signed)
Received copy of Bone Marrow report; forwarded to Dr. Ha. 

## 2011-09-04 NOTE — Progress Notes (Signed)
Patient denied EPP financial assistance patient income for 2  [phone]

## 2011-09-23 ENCOUNTER — Telehealth: Payer: Self-pay | Admitting: *Deleted

## 2011-09-23 NOTE — Telephone Encounter (Signed)
Pt left VM states calling for her test results.  Says she was instructed to call by Dr. Gaylyn Rong if she hasn't heard anything by now.

## 2011-09-26 ENCOUNTER — Telehealth: Payer: Self-pay | Admitting: *Deleted

## 2011-09-26 NOTE — Telephone Encounter (Signed)
Pt left VM requesting Results of tests.

## 2011-11-05 ENCOUNTER — Other Ambulatory Visit: Payer: Medicare Other | Admitting: Lab

## 2011-11-07 ENCOUNTER — Other Ambulatory Visit (HOSPITAL_BASED_OUTPATIENT_CLINIC_OR_DEPARTMENT_OTHER): Payer: Medicare Other | Admitting: Lab

## 2011-11-07 ENCOUNTER — Telehealth: Payer: Self-pay | Admitting: Oncology

## 2011-11-07 ENCOUNTER — Ambulatory Visit (HOSPITAL_BASED_OUTPATIENT_CLINIC_OR_DEPARTMENT_OTHER): Payer: Medicare Other | Admitting: Oncology

## 2011-11-07 VITALS — BP 147/79 | HR 87 | Temp 97.1°F | Resp 20 | Ht 62.5 in | Wt 195.3 lb

## 2011-11-07 DIAGNOSIS — D649 Anemia, unspecified: Secondary | ICD-10-CM

## 2011-11-07 DIAGNOSIS — D472 Monoclonal gammopathy: Secondary | ICD-10-CM

## 2011-11-07 DIAGNOSIS — D72819 Decreased white blood cell count, unspecified: Secondary | ICD-10-CM

## 2011-11-07 DIAGNOSIS — J449 Chronic obstructive pulmonary disease, unspecified: Secondary | ICD-10-CM

## 2011-11-07 DIAGNOSIS — F419 Anxiety disorder, unspecified: Secondary | ICD-10-CM

## 2011-11-07 DIAGNOSIS — D72810 Lymphocytopenia: Secondary | ICD-10-CM

## 2011-11-07 DIAGNOSIS — R5383 Other fatigue: Secondary | ICD-10-CM

## 2011-11-07 DIAGNOSIS — I10 Essential (primary) hypertension: Secondary | ICD-10-CM

## 2011-11-07 DIAGNOSIS — J45909 Unspecified asthma, uncomplicated: Secondary | ICD-10-CM

## 2011-11-07 DIAGNOSIS — M1990 Unspecified osteoarthritis, unspecified site: Secondary | ICD-10-CM

## 2011-11-07 DIAGNOSIS — K589 Irritable bowel syndrome without diarrhea: Secondary | ICD-10-CM

## 2011-11-07 LAB — CBC WITH DIFFERENTIAL/PLATELET
BASO%: 0.6 % (ref 0.0–2.0)
EOS%: 4.5 % (ref 0.0–7.0)
LYMPH%: 17 % (ref 14.0–49.7)
MCH: 36.2 pg — ABNORMAL HIGH (ref 25.1–34.0)
MCHC: 35.3 g/dL (ref 31.5–36.0)
MONO#: 0.4 10*3/uL (ref 0.1–0.9)
Platelets: 217 10*3/uL (ref 145–400)
RBC: 3 10*6/uL — ABNORMAL LOW (ref 3.70–5.45)
WBC: 4.6 10*3/uL (ref 3.9–10.3)
lymph#: 0.8 10*3/uL — ABNORMAL LOW (ref 0.9–3.3)

## 2011-11-07 LAB — CHCC SMEAR

## 2011-11-07 NOTE — Telephone Encounter (Signed)
gv pt appt schedule for November 2013 and January - March - May 2014.

## 2011-11-07 NOTE — Progress Notes (Signed)
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    CURRENT THERAPY:  Oral iron.   INTERVAL HISTORY: Nicole Meyer 71 y.o. female returns for regular follow up with her husband.  She still reports fatigue. This is been going for many years.  It has not been getting worse lately. 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  She denies anorexia, weight loss, visible source of bleeding. She is been taking oral iron without much problem such as dyspepsia, abdominal pain, constipation, nausea, vomiting.   Patient denies fever, headache, visual changes, confusion, drenching night sweats, palpable lymph node swelling, mucositis, odynophagia, dysphagia, nausea vomiting, jaundice, chest pain, palpitation, shortness of breath, dyspnea on exertion, productive cough, gum bleeding, epistaxis, hematemesis, hemoptysis, abdominal pain, abdominal swelling, early satiety, melena, hematochezia, hematuria, skin rash, spontaneous bleeding, joint swelling, joint pain, heat or cold intolerance, bowel bladder incontinence, back pain, focal motor weakness, paresthesia, depression, suicidal or homicidal ideation, feeling hopelessness.   Past Medical History  Diagnosis Date  . Anemia   . Leukocytopenia   . HTN (hypertension)   . Asthma   . IBS (irritable bowel syndrome)   . Arthritis   . Anxiety   . Depression   . Chronic kidney disease (CKD), stage III (moderate)   . Hypercalcemia   . Fatigue   . Lymphopenia 08/23/2011  . COPD (chronic obstructive pulmonary disease)     Past Surgical History  Procedure Date  . Abdominal hysterectomy     Current Outpatient Prescriptions  Medication Sig Dispense Refill  . albuterol (PROVENTIL) (2.5 MG/3ML) 0.083% nebulizer solution Take 2.5 mg  by nebulization every 6 (six) hours as needed. Wheezing and shortness of breath      . aspirin 81 MG tablet Take 81 mg by mouth daily.      . Aspirin-Acetaminophen-Caffeine (EXCEDRIN PO) Take 2 capsules by mouth daily as needed.      . Calcium Carb-Cholecalciferol (CALCIUM + D3) 600-200 MG-UNIT TABS Take 1 tablet by mouth daily.      . citalopram (CELEXA) 20 MG tablet Take 20 mg by mouth daily.      [name] lisinopril-hydrochlorothiazide (PRINZIDE,ZESTORETIC) 20-12.5 MG per tablet Take 1 tablet by mouth daily with breakfast.       . Multiple Vitamin (MULTIVITAMIN) tablet Take 1 tablet by mouth daily.      [name] omeprazole (PRILOSEC) 20 MG capsule Take 20 mg by mouth daily.        ALLERGIES:  is allergic to brovana; penicillins; and paxil.  REVIEW OF SYSTEMS:  The rest of the 14-point review of system was negative.   Filed Vitals:   11/07/11 1019  BP: 147/79  Pulse: 87  Temp: 97.1 F (36.2 C)  Resp: 20   Wt Readings from Last 3 Encounters:  11/07/11 195 lb 4.8 oz (88.587 kg)  09/03/11 197 lb (89.359 kg)  08/26/11 197 lb 9.6 oz (89.631 kg)   ECOG Performance status: 1  PHYSICAL EXAMINATION:   General:  Obese woman, in no acute distress.  Eyes:  no scleral icterus.  ENT:  There were no oropharyngeal lesions.  Neck was without thyromegaly.  Lymphatics:  Negative cervical, supraclavicular or axillary adenopathy.  Respiratory: lungs were clear bilaterally without wheezing or crackles.  Cardiovascular:  Regular rate and rhythm, S1/S2, without murmur,  rub or gallop.  There was no pedal edema.  GI:  abdomen was soft, flat, nontender, nondistended, without organomegaly.  Muscoloskeletal:  no spinal tenderness of palpation of vertebral spine.  Skin exam was without echymosis, petichae.  Neuro exam was nonfocal.  Patient was able to get on and off exam table without assistance.  Gait was normal.  Patient was alerted and oriented.  Attention was good.   Language was appropriate.  Mood was normal without  depression.  Speech was not pressured.  Thought content was not tangential.      LABORATORY/RADIOLOGY DATA:  Lab Results  Component Value Date   WBC 4.6 11/07/2011   HGB 10.9* 11/07/2011   HCT 30.8* 11/07/2011   PLT 217 11/07/2011   GLUCOSE 99 08/26/2011   CHOL  Value: 188        ATP III CLASSIFICATION:  <200     mg/dL   Desirable  161-096  mg/dL   Borderline High  >=045    mg/dL   High        05/27/8117   TRIG 133 04/05/2008   HDL 38* 04/05/2008   LDLCALC  Value: 123        Total Cholesterol/HDL:CHD Risk Coronary Heart Disease Risk Table                     Men   Women  1/2 Average Risk   3.4   3.3  Average Risk       5.0   4.4  2 X Average Risk   9.6   7.1  3 X Average Risk  23.4   11.0        Use the calculated Patient Ratio above and the CHD Risk Table to determine the patient's CHD Risk.        ATP III CLASSIFICATION (LDL):  <100     mg/dL   Optimal  147-829  mg/dL   Near or Above                    Optimal  130-159  mg/dL   Borderline  562-130  mg/dL   High  >865     mg/dL   Very High* 7/84/6962   ALKPHOS 79 04/05/2008   ALT 23 04/05/2008   AST 23 04/05/2008   NA 137 08/26/2011   K 4.4 08/26/2011   CL 107 08/26/2011   CREATININE 0.98 08/26/2011   BUN 16 08/26/2011   CO2 24 08/26/2011   INR 1.01 09/03/2011   HGBA1C  Value: 5.3 (NOTE)   The ADA recommends the following therapeutic goal for glycemic   control related to Hgb A1C measurement:   Goal of Therapy:   < 7.0% Hgb A1C   Reference: American Diabetes Association: Clinical Practice   Recommendations 2008, Diabetes Care,  2008, 31:(Suppl 1). 04/05/2008    ASSESSMENT AND PLAN:    1. COPD: From past smoking, none now. Her COPD is well controlled on inhalers per PCP.  2. Depression/axiety: Well controlled on Celexa per PCP.  3. Hypertension:  She is on Lisinopril/HCTZ per PCP.  4. Leukopenia/lymphopenia: Very slight without neutropenia and recurrent infection. HIV testing was negative in 08/2011.  Her WBC has improved today.  5.   Anemia:  Unclear  reason.  Extensive work up was negative including bone marrow biopsy.  Hgb today has improved to 10.9 without intervention.  Diagnosis of exclusion is anemia of dyserythropoiesis ("weakened bone marrow from aging process" as opposed to cancer).  Even though her ferritin was not low and there was no sign of iron deficiency anemia on bone marrow biopsy, her Hgb improved on oral iron. I advised her to continue take oral iron as tolerated this time. - Recommendation:  Observation.  Follow up with blood test at the [facility] every 2 months.  We may consider transfusion when Hgb <7.  We may consider Aranesp injection when patient requires frequent blood transfusion.   Return visit in about 8 months.     The length of time of the face-to-face encounter was 15 minutes. More than 50% of time was spent counseling and coordination of care.

## 2011-11-07 NOTE — Patient Instructions (Addendum)
1.  Anemia:  Unclear reason.  Extensive work up was negative including bone marrow biopsy.  Hgb today has improved to 10.9 without intervention.  Diagnosis of exclusion is anemia of dyserythropoiesis ("weakened bone marrow from aging process" as opposed to cancer) 2.  Recommendation:  Observation.  Follow up with blood test at the [facility] every 2 months.  We may consider transfusion when Hgb <7.  We may consider Aranesp injection when patient requires frequent blood transfusion.   Return visit in about 8 months.

## 2011-12-24 ENCOUNTER — Encounter (HOSPITAL_COMMUNITY): Payer: Self-pay | Admitting: *Deleted

## 2011-12-24 ENCOUNTER — Emergency Department (HOSPITAL_COMMUNITY): Payer: Medicare Other

## 2011-12-24 ENCOUNTER — Inpatient Hospital Stay (HOSPITAL_COMMUNITY)
Admission: EM | Admit: 2011-12-24 | Discharge: 2011-12-27 | DRG: 391 | Disposition: A | Discharge status: Home / Self Care | Payer: Medicare Other | Attending: Internal Medicine | Admitting: Internal Medicine

## 2011-12-24 DIAGNOSIS — R5383 Other fatigue: Secondary | ICD-10-CM

## 2011-12-24 DIAGNOSIS — R079 Chest pain, unspecified: Secondary | ICD-10-CM | POA: Diagnosis present

## 2011-12-24 DIAGNOSIS — R1013 Epigastric pain: Secondary | ICD-10-CM

## 2011-12-24 DIAGNOSIS — I10 Essential (primary) hypertension: Secondary | ICD-10-CM | POA: Diagnosis present

## 2011-12-24 DIAGNOSIS — J449 Chronic obstructive pulmonary disease, unspecified: Secondary | ICD-10-CM

## 2011-12-24 DIAGNOSIS — M1990 Unspecified osteoarthritis, unspecified site: Secondary | ICD-10-CM

## 2011-12-24 DIAGNOSIS — K589 Irritable bowel syndrome without diarrhea: Secondary | ICD-10-CM | POA: Diagnosis present

## 2011-12-24 DIAGNOSIS — J45909 Unspecified asthma, uncomplicated: Secondary | ICD-10-CM

## 2011-12-24 DIAGNOSIS — J4489 Other specified chronic obstructive pulmonary disease: Secondary | ICD-10-CM | POA: Diagnosis present

## 2011-12-24 DIAGNOSIS — K449 Diaphragmatic hernia without obstruction or gangrene: Secondary | ICD-10-CM

## 2011-12-24 DIAGNOSIS — D72819 Decreased white blood cell count, unspecified: Secondary | ICD-10-CM

## 2011-12-24 DIAGNOSIS — F419 Anxiety disorder, unspecified: Secondary | ICD-10-CM

## 2011-12-24 DIAGNOSIS — I129 Hypertensive chronic kidney disease with stage 1 through stage 4 chronic kidney disease, or unspecified chronic kidney disease: Secondary | ICD-10-CM | POA: Diagnosis present

## 2011-12-24 DIAGNOSIS — D649 Anemia, unspecified: Secondary | ICD-10-CM

## 2011-12-24 DIAGNOSIS — N390 Urinary tract infection, site not specified: Secondary | ICD-10-CM | POA: Diagnosis present

## 2011-12-24 DIAGNOSIS — Z79899 Other long term (current) drug therapy: Secondary | ICD-10-CM

## 2011-12-24 DIAGNOSIS — R55 Syncope and collapse: Secondary | ICD-10-CM

## 2011-12-24 DIAGNOSIS — Z7982 Long term (current) use of aspirin: Secondary | ICD-10-CM

## 2011-12-24 DIAGNOSIS — K562 Volvulus: Secondary | ICD-10-CM | POA: Diagnosis present

## 2011-12-24 DIAGNOSIS — R109 Unspecified abdominal pain: Secondary | ICD-10-CM

## 2011-12-24 DIAGNOSIS — D589 Hereditary hemolytic anemia, unspecified: Secondary | ICD-10-CM | POA: Diagnosis present

## 2011-12-24 DIAGNOSIS — A498 Other bacterial infections of unspecified site: Secondary | ICD-10-CM | POA: Diagnosis present

## 2011-12-24 DIAGNOSIS — D72810 Lymphocytopenia: Secondary | ICD-10-CM

## 2011-12-24 DIAGNOSIS — N183 Chronic kidney disease, stage 3 unspecified: Secondary | ICD-10-CM | POA: Diagnosis present

## 2011-12-24 DIAGNOSIS — K440 Diaphragmatic hernia with obstruction, without gangrene: Secondary | ICD-10-CM | POA: Diagnosis present

## 2011-12-24 DIAGNOSIS — Z87891 Personal history of nicotine dependence: Secondary | ICD-10-CM

## 2011-12-24 HISTORY — DX: Urinary tract infection, site not specified: N39.0

## 2011-12-24 HISTORY — DX: Disorder of kidney and ureter, unspecified: N28.9

## 2011-12-24 HISTORY — DX: Syncope and collapse: R55

## 2011-12-24 HISTORY — DX: Iron deficiency anemia, unspecified: D50.9

## 2011-12-24 HISTORY — DX: Gastro-esophageal reflux disease without esophagitis: K21.9

## 2011-12-24 HISTORY — DX: Pure hypercholesterolemia, unspecified: E78.00

## 2011-12-24 HISTORY — DX: Personal history of other diseases of the digestive system: Z87.19

## 2011-12-24 LAB — URINALYSIS, ROUTINE W REFLEX MICROSCOPIC
Bilirubin Urine: NEGATIVE
Glucose, UA: NEGATIVE mg/dL
Hgb urine dipstick: NEGATIVE
Ketones, ur: NEGATIVE mg/dL
Nitrite: POSITIVE — AB
Protein, ur: NEGATIVE mg/dL
Specific Gravity, Urine: 1.018 (ref 1.005–1.030)
Urobilinogen, UA: 1 mg/dL (ref 0.0–1.0)
pH: 5 (ref 5.0–8.0)

## 2011-12-24 LAB — CBC WITH DIFFERENTIAL/PLATELET
Basophils Absolute: 0 K/uL (ref 0.0–0.1)
Basophils Relative: 0 % (ref 0–1)
Eosinophils Absolute: 0.1 K/uL (ref 0.0–0.7)
Eosinophils Relative: 2 % (ref 0–5)
HCT: 29 % — ABNORMAL LOW (ref 36.0–46.0)
Hemoglobin: 9.8 g/dL — ABNORMAL LOW (ref 12.0–15.0)
Lymphocytes Relative: 9 % — ABNORMAL LOW (ref 12–46)
Lymphs Abs: 0.6 K/uL — ABNORMAL LOW (ref 0.7–4.0)
MCH: 33.7 pg (ref 26.0–34.0)
MCHC: 33.8 g/dL (ref 30.0–36.0)
MCV: 99.7 fL (ref 78.0–100.0)
Monocytes Absolute: 0.4 K/uL (ref 0.1–1.0)
Monocytes Relative: 6 % (ref 3–12)
Neutro Abs: 5.5 K/uL (ref 1.7–7.7)
Neutrophils Relative %: 83 % — ABNORMAL HIGH (ref 43–77)
Platelets: 223 K/uL (ref 150–400)
RBC: 2.91 MIL/uL — ABNORMAL LOW (ref 3.87–5.11)
RDW: 13 % (ref 11.5–15.5)
WBC: 6.6 K/uL (ref 4.0–10.5)

## 2011-12-24 LAB — URINE MICROSCOPIC-ADD ON

## 2011-12-24 LAB — LACTIC ACID, PLASMA: Lactic Acid, Venous: 0.8 mmol/L (ref 0.5–2.2)

## 2011-12-24 LAB — POCT I-STAT TROPONIN I: Troponin i, poc: 0.01 ng/mL (ref 0.00–0.08)

## 2011-12-24 LAB — BASIC METABOLIC PANEL
Chloride: 104 mEq/L (ref 96–112)
GFR calc Af Amer: 56 mL/min — ABNORMAL LOW (ref >90)
GFR calc non Af Amer: 48 mL/min — ABNORMAL LOW (ref >90)
Potassium: 4.5 mEq/L (ref 3.5–5.1)
Sodium: 136 mEq/L (ref 135–145)

## 2011-12-24 LAB — HEPATIC FUNCTION PANEL
Bilirubin, Direct: 0.2 mg/dL (ref 0.0–0.3)
Indirect Bilirubin: 0.9 mg/dL (ref 0.3–0.9)
Total Protein: 7.6 g/dL (ref 6.0–8.3)

## 2011-12-24 LAB — LIPASE, BLOOD: Lipase: 64 U/L — ABNORMAL HIGH (ref 11–59)

## 2011-12-24 MED ORDER — LISINOPRIL-HYDROCHLOROTHIAZIDE 20-12.5 MG PO TABS: 1.0000 | ORAL_TABLET | Freq: Every day | ORAL | Status: DC

## 2011-12-24 MED ORDER — PANTOPRAZOLE SODIUM 40 MG IV SOLR: 40.0000 mg | INTRAVENOUS | Status: DC

## 2011-12-24 MED ORDER — ACETAMINOPHEN 325 MG PO TABS: 650.0000 mg | ORAL_TABLET | Freq: Four times a day (QID) | ORAL | Status: DC | PRN

## 2011-12-24 MED ORDER — IOHEXOL 300 MG/ML  SOLN
80.0000 mL | Freq: Once | INTRAMUSCULAR | Status: AC | PRN
  Administered 2011-12-24: 80 mL via INTRAVENOUS

## 2011-12-24 MED ORDER — ONDANSETRON HCL 4 MG/2ML IJ SOLN
4.0000 mg | Freq: Four times a day (QID) | INTRAMUSCULAR | Status: DC | PRN
  Administered 2011-12-26: 4 mg via INTRAVENOUS
  Filled 2011-12-24: qty 2

## 2011-12-24 MED ORDER — MORPHINE SULFATE 2 MG/ML IJ SOLN: 1.0000 mg | INTRAMUSCULAR | Status: DC | PRN

## 2011-12-24 MED ORDER — ACETAMINOPHEN 650 MG RE SUPP: 650.0000 mg | Freq: Four times a day (QID) | RECTAL | Status: DC | PRN

## 2011-12-24 MED ORDER — SENNOSIDES-DOCUSATE SODIUM 8.6-50 MG PO TABS
1.0000 | ORAL_TABLET | Freq: Every evening | ORAL | Status: DC | PRN
  Filled 2011-12-24: qty 1

## 2011-12-24 MED ORDER — HYDROCODONE-ACETAMINOPHEN 5-325 MG PO TABS: 1.0000 | ORAL_TABLET | ORAL | Status: DC | PRN

## 2011-12-24 MED ORDER — SODIUM CHLORIDE 0.9 % IV BOLUS (SEPSIS)
500.0000 mL | Freq: Once | INTRAVENOUS | Status: AC
  Administered 2011-12-24: 500 mL via INTRAVENOUS

## 2011-12-24 MED ORDER — SODIUM CHLORIDE 0.9 % IV SOLN
INTRAVENOUS | Status: DC
  Administered 2011-12-25 – 2011-12-27 (×2): via INTRAVENOUS

## 2011-12-24 MED ORDER — ONDANSETRON HCL 4 MG PO TABS
4.0000 mg | ORAL_TABLET | Freq: Four times a day (QID) | ORAL | Status: DC | PRN
  Filled 2011-12-24: qty 0.5

## 2011-12-24 MED ORDER — ENOXAPARIN SODIUM 40 MG/0.4ML ~~LOC~~ SOLN
40.0000 mg | SUBCUTANEOUS | Status: DC
  Administered 2011-12-25: 40 mg via SUBCUTANEOUS
  Filled 2011-12-24: qty 0.4

## 2011-12-24 MED ORDER — ALUM & MAG HYDROXIDE-SIMETH 200-200-20 MG/5ML PO SUSP: 30.0000 mL | Freq: Four times a day (QID) | ORAL | Status: DC | PRN

## 2011-12-24 MED ORDER — IOHEXOL 300 MG/ML  SOLN: 20.0000 mL | INTRAMUSCULAR | Status: AC

## 2011-12-24 MED ORDER — SODIUM CHLORIDE 0.9 % IJ SOLN
3.0000 mL | Freq: Two times a day (BID) | INTRAMUSCULAR | Status: DC
  Administered 2011-12-25 (×2): 3 mL via INTRAVENOUS

## 2011-12-24 NOTE — ED Notes (Signed)
Pt reports she was in car when she suddenly had severe mid-epigastric pain along with nausea then passed out for a few mins, husband was in car with pt. Pt denies abd pain now. Pt's husband reports pt never became sweaty or lightheaded.

## 2011-12-24 NOTE — ED Notes (Signed)
Patient denies pain and is resting comfortably.  

## 2011-12-24 NOTE — H&P (Signed)
PCP:   Thora Lance, MD   Chief Complaint:  Loss of consciousness   HPI: This is a 71 year old female who was a passenger in the car when she developed an epigastric pain. The pain was sudden onset, ranked 9/10 and sharp. She loss consciousness for approximate to 3-4 minutes. When she awoke, she had soreness in her stomach and some nausea. Her husband took her to the closest urgent care clinic, she was sent to the ER. The patient denies any chest pain, diaphoresis,  she was not post ictal, she no palpitations, no headache, no localized weakness, no seizure activity, no slurred speech. The patient does have a history of irritable syndrome and has chronic  intermittent abdominal discomfort. This however was worse than usual. Her last EGD was greater than 5 years ago. She does have heartburn for which she takes Prilosec, she takes a baby aspirin daily. She doesn't eat spicy foods or take any other NSAIDs. The patient has a recent diagnosis of anemia that is being worked up as an outpatient. She had a bone marrow biopsy but no recent colonoscopy or EGD.in the ER the patient was guaiac negative. History provided by the patient and her husband is at the bedside   Review of Systems:  The patient denies anorexia, fever, weight loss,, vision loss, decreased hearing, hoarseness, chest pain, dyspnea on exertion, peripheral edema, balance deficits, hemoptysis, melena, hematochezia, severe indigestion/heartburn, hematuria, incontinence, genital sores, muscle weakness, suspicious skin lesions, transient blindness, difficulty walking, depression, unusual weight change, abnormal bleeding, enlarged lymph nodes, angioedema, and breast masses.  Past Medical History: Past Medical History  Diagnosis Date  . Anemia   . Leukocytopenia   . HTN (hypertension)   . Asthma   . IBS (irritable bowel syndrome)   . Arthritis   . Anxiety   . Depression   . Chronic kidney disease (CKD), stage III (moderate)   .  Hypercalcemia   . Fatigue   . Lymphopenia 08/23/2011  . COPD (chronic obstructive pulmonary disease)   . Renal insufficiency    Past Surgical History  Procedure Date  . Abdominal hysterectomy     Medications: Prior to Admission medications   Medication Sig Start Date End Date Taking? Authorizing Provider  albuterol (PROVENTIL) (2.5 MG/3ML) 0.083% nebulizer solution Take 2.5 mg by nebulization every 6 (six) hours as needed. Wheezing and shortness of breath   Yes Historical Provider, MD  aspirin 81 MG tablet Take 81 mg by mouth daily.   Yes Historical Provider, MD  Calcium Carb-Cholecalciferol (CALCIUM + D3) 600-200 MG-UNIT TABS Take 1 tablet by mouth daily.   Yes Historical Provider, MD  lisinopril-hydrochlorothiazide (PRINZIDE,ZESTORETIC) 20-12.5 MG per tablet Take 1 tablet by mouth daily with breakfast.    Yes Historical Provider, MD  Multiple Vitamin (MULTIVITAMIN) tablet Take 1 tablet by mouth daily.   Yes Historical Provider, MD  omeprazole (PRILOSEC) 20 MG capsule Take 20 mg by mouth daily.   Yes Historical Provider, MD    Allergies:   Allergies  Allergen Reactions  . Brovana (Arformoterol) Other (See Comments)    "makes me nervous"  . Penicillins Hives  . Paxil (Paroxetine Hcl) Rash    Social History:  reports that she quit smoking about 11 years ago. She does not have any smokeless tobacco history on file. She reports that she does not drink alcohol or use illicit drugs.  Family History: Family History  Problem Relation Age of Onset  . Diabetes Father   . Heart disease Father   .  Cancer Maternal Aunt     Breast cancer    Physical Exam: Filed Vitals:   12/24/11 2015 12/24/11 2045 12/24/11 2115 12/24/11 2145  BP: 123/63 115/80 127/52 106/61  Pulse: 70 75 66 76  Temp:      TempSrc:      Resp: 21 22 19 19   SpO2: 98% 99% 100% 99%    General:  Alert and oriented times three, well developed and nourished, no acute distress Eyes: PERRLA, pink conjunctiva, no  scleral icterus ENT: Moist oral mucosa, neck supple, no thyromegaly Lungs: clear to ascultation, no wheeze, no crackles, no use of accessory muscles Cardiovascular: regular rate and rhythm, no regurgitation, no gallops, no murmurs. No carotid bruits, no JVD Abdomen: soft, positive BS,  mild nonspecific tenderness to palpation epigastric in location , non-distended, no organomegaly, not an acute abdomen GU: not examined Neuro: CN II - XII grossly intact, sensation intact Musculoskeletal: strength 5/5 all extremities, no clubbing, cyanosis or edema Skin: no rash, no subcutaneous crepitation, no decubitus Psych: appropriate patient   Labs on Admission:   Nicole Meyer 12/24/11 1548  NA 136  K 4.5  CL 104  CO2 21  GLUCOSE 99  BUN 26*  CREATININE 1.11*  CALCIUM 10.1  MG --  PHOS --    Basename 12/24/11 1743  AST 23  ALT 24  ALKPHOS 64  BILITOT 1.1  PROT 7.6  ALBUMIN 4.0    Basename 12/24/11 1743  LIPASE 64*  AMYLASE --    Basename 12/24/11 1548  WBC 6.6  NEUTROABS 5.5  HGB 9.8*  HCT 29.0*  MCV 99.7  PLT 223   No results found for this basename: CKTOTAL:3,CKMB:3,CKMBINDEX:3,TROPONINI:3 in the last 72 hours No components found with this basename: POCBNP:3 No results found for this basename: DDIMER:2 in the last 72 hours No results found for this basename: HGBA1C:2 in the last 72 hours No results found for this basename: CHOL:2,HDL:2,LDLCALC:2,TRIG:2,CHOLHDL:2,LDLDIRECT:2 in the last 72 hours No results found for this basename: TSH,T4TOTAL,FREET3,T3FREE,THYROIDAB in the last 72 hours No results found for this basename: VITAMINB12:2,FOLATE:2,FERRITIN:2,TIBC:2,IRON:2,RETICCTPCT:2 in the last 72 hours  Micro Results: Results for Nicole Meyer, Nicole Meyer (MRN [id number]) as of 12/24/2011 22:00  Ref. Range 12/24/2011 17:38  Color, Urine Latest Range: YELLOW  YELLOW  APPearance Latest Range: CLEAR  CLOUDY (A)  Specific Gravity, Urine Latest Range: 1.005-1.030  1.018  pH Latest  Range: 5.0-8.0  5.0  Glucose Latest Range: NEGATIVE mg/dL NEGATIVE  Bilirubin Urine Latest Range: NEGATIVE  NEGATIVE  Ketones, ur Latest Range: NEGATIVE mg/dL NEGATIVE  Protein Latest Range: NEGATIVE mg/dL NEGATIVE  Urobilinogen, UA Latest Range: 0.0-1.0 mg/dL 1.0  Nitrite Latest Range: NEGATIVE  POSITIVE (A)  Leukocytes, UA Latest Range: NEGATIVE  MODERATE (A)  Hgb urine dipstick Latest Range: NEGATIVE  NEGATIVE  Urine-Other No range found FEW YEAST  WBC, UA Latest Range: <3 WBC/hpf 11-20  RBC / HPF Latest Range: <3 RBC/hpf 0-2  Squamous Epithelial / LPF Latest Range: RARE  MANY (A)  Bacteria, UA Latest Range: RARE  MANY (A)     Radiological Exams on Admission: Dg Chest 2 View  12/24/2011  *RADIOLOGY REPORT*  Clinical Data: Loss of consciousness  CHEST - 2 VIEW  Comparison: 04/04/2008  Findings:  Unchanged cardiac silhouette and mediastinal contours.  Unchanged retrocardiac air and fluid-filled opacity compatible with a hiatal hernia.  There is mild diffuse thickening of the pulmonary interstitium.  No focal airspace opacities.  No definite pleural effusion or pneumothorax.  Unchanged bones.  IMPRESSION:  1.  No acute cardiopulmonary disease.  2.  Hiatal hernia.   Original Report Authenticated By: Tacey Ruiz, MD    Ct Abdomen Pelvis W Contrast  12/24/2011  *RADIOLOGY REPORT*  Clinical Data: Severe mid epigastric abdominal pain and nausea, after which the patient became syncopal.  CT ABDOMEN AND PELVIS WITH CONTRAST  Technique:  Multidetector CT imaging of the abdomen and pelvis was performed following the standard protocol during bolus administration of intravenous contrast.  Contrast: 80mL OMNIPAQUE IOHEXOL 300 MG/ML. Oral contrast was also administered.  Comparison: None.  Findings: Moderate sized hiatal hernia, with approximately one third of the stomach present within the chest.  Marked thickening of the wall of the visualized distal esophagus, though the wall thickening is asymmetric  and increased along the right lateral wall.  Stomach otherwise normal in appearance.  Mild wall thickening involving the terminal ileum, with fat in the wall. Remaining small bowel normal in appearance.  Scattered diverticula involving the sigmoid colon without evidence of acute diverticulitis; colon otherwise normal in appearance.  Lipoma involving the ileocecal valve.  Appendix not identified, but no pericecal inflammation. No ascites.  Normal appearing liver, spleen, pancreas, adrenal glands, and kidneys.  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  No significant lymphadenopathy.  Gallbladder unremarkable by CT.  No biliary ductal dilation.  Uterus surgically absent.  No adnexal masses or free pelvic fluid. Very small diverticulum arising from the left lateral wall of the bladder at its base.  Bone window images demonstrate degenerative changes involving the lower lumbar spine.  Visualized lung bases clear.  IMPRESSION:  1.  Moderate sized hiatal hernia.  Asymmetric wall thickening involving the visualized distal esophagus; while this may be due to esophagitis and chronic GE reflux disease, the asymmetry places neoplasm in the differential diagnosis.  Upper endoscopy is suggested in further evaluation. 2.  No acute abnormalities involving the abdomen or pelvis. 3.  Wall thickening involving the terminal ileum, with fat in the wall suggesting chronic inflammation.  No evidence of acute inflammatory changes. 4.  Very small diverticulum arising from the left lateral wall of the urinary bladder at its base. 5.  Scattered sigmoid colon diverticula without evidence of acute diverticulitis.   Original Report Authenticated By: Hulan Saas, [name]     Assessment/Plan Present on Admission:  . Syncope and collapse Abdominal pain  . IBS (irritable bowel syndrome)  patient will be brought in for 23 hour observation on telemetry Patient may benefit from an EGD, given her current symptoms along with  anemia and the findings on the CT scan. Defer to a.m. team for her GI consult. PPIs and pain meds as ordered as needed  . Anemia . HTN (hypertension) . Chronic kidney disease (CKD), stage III (moderate)  stable resume home medications  Full code DVT prophylaxis   Ramelo Oetken 12/24/2011, 10:00 PM

## 2011-12-24 NOTE — ED Provider Notes (Signed)
Patient suffered syncopal event this afternoon. Complaint of epigastric pain immediately prior syncopal event. Presently asymptomatic on exam no distress alert nontoxic abdomen soft mild tenderness at epigastrium  Doug Sou, MD 12/24/11 Rickey Primus

## 2011-12-24 NOTE — ED Notes (Signed)
Pt finished drinking contrast, CT notified 

## 2011-12-24 NOTE — ED Notes (Signed)
Pt was sitting in car, got mid abdominal pain with nausea and then passed out.  Pt was out for a few minutes.  No diabetes.  No chest pain.  Pt has sob r/t copd.  No pain now

## 2011-12-24 NOTE — ED Provider Notes (Signed)
History     CSN: [id number]  Arrival date & time 12/24/11  1529   First MD Initiated Contact with Patient 12/24/11 1652      Chief Complaint  Patient presents with  . Loss of Consciousness    (Consider location/radiation/quality/duration/timing/severity/associated sxs/prior treatment) HPI Syncopal event while riding in car with husband. He reports she was out for about 4 minutes. Prior to episode pt had severe abdominal pain. Quick return to baseline after event. Now with no abdominal pain.  Pain described as severe in intensity. The location of the patient's problem is periumbilical.  Onset was today with resolved course since that time.   Modifying factors:  none.  Associated symptoms: nausea but no vomiting.   Past Medical History  Diagnosis Date  . Anemia   . Leukocytopenia   . HTN (hypertension)   . Asthma   . IBS (irritable bowel syndrome)   . Arthritis   . Anxiety   . Depression   . Chronic kidney disease (CKD), stage III (moderate)   . Hypercalcemia   . Fatigue   . Lymphopenia 08/23/2011  . COPD (chronic obstructive pulmonary disease)     Past Surgical History  Procedure Date  . Abdominal hysterectomy     Family History  Problem Relation Age of Onset  . Diabetes Father   . Heart disease Father   . Cancer Maternal Aunt     Breast cancer    History  Substance Use Topics  . Smoking status: Former Smoker -- 1.0 packs/day for 40 years    Quit date: 02/19/2000  . Smokeless tobacco: Not on file  . Alcohol Use: No    OB History    Grav Para Term Preterm Abortions TAB SAB Ect Mult Living                  Review of Systems Negative for respiratory distress, cough. Negative for vomiting, diarrhea.  All other systems reviewed and negative unless noted in HPI.    Allergies  Brovana; Penicillins; and Paxil  Home Medications   Current Outpatient Rx  Name  Route  Sig  Dispense  Refill  . ALBUTEROL SULFATE (2.5 MG/3ML) 0.083% IN NEBU   Nebulization  Take 2.5 mg by nebulization every 6 (six) hours as needed. Wheezing and shortness of breath         . ASPIRIN 81 MG PO TABS   Oral   Take 81 mg by mouth daily.         [name] EXCEDRIN PO   Oral   Take 2 capsules by mouth daily as needed.         [name] CALCIUM + D3 600-200 MG-UNIT PO TABS   Oral   Take 1 tablet by mouth daily.         [name] CITALOPRAM HYDROBROMIDE 20 MG PO TABS   Oral   Take 20 mg by mouth daily.         [name] LISINOPRIL-HYDROCHLOROTHIAZIDE 20-12.5 MG PO TABS   Oral   Take 1 tablet by mouth daily with breakfast.          . ONE-DAILY MULTI VITAMINS PO TABS   Oral   Take 1 tablet by mouth daily.         [name] OMEPRAZOLE 20 MG PO CPDR   Oral   Take 20 mg by mouth daily.           BP 139/65  Pulse 94  Temp 98.1 F (36.7 C)  Resp 20  SpO2 98%  Physical Exam Nursing note and vitals reviewed.  Constitutional: Pt is alert and appears stated age. Oropharynx: Airway open without erythema or exudate. Respiratory: No respiratory distress. Equal breathing bilaterally. CV: Extremities warm and well perfused. Neuro: No motor nor sensory deficit. Head: Normocephalic and atraumatic. Eyes: No conjunctivitis, no scleral icterus. Neck: Supple, no mass. Chest: Non-tender. Abdomen: Soft, mild periumbilical tenderness.  MSK: Extremities are atraumatic without deformity. Skin: No rash, no wounds. GU exam: Stool heme neg.  ED Course  Procedures (including critical care time)  Labs Reviewed  CBC WITH DIFFERENTIAL - Abnormal; Notable for the following:    RBC 2.91 (*)     Hemoglobin 9.8 (*)     HCT 29.0 (*)     Neutrophils Relative 83 (*)     Lymphocytes Relative 9 (*)     Lymphs Abs 0.6 (*)     All other components within normal limits  BASIC METABOLIC PANEL - Abnormal; Notable for the following:    BUN 26 (*)     Creatinine, Ser 1.11 (*)     GFR calc non Af Amer 48 (*)     GFR calc Af Amer 56 (*)     All other components within normal limits  URINALYSIS,  ROUTINE W REFLEX MICROSCOPIC - Abnormal; Notable for the following:    APPearance CLOUDY (*)     Nitrite POSITIVE (*)     Leukocytes, UA MODERATE (*)     All other components within normal limits  LIPASE, BLOOD - Abnormal; Notable for the following:    Lipase 64 (*)     All other components within normal limits  URINE MICROSCOPIC-ADD ON - Abnormal; Notable for the following:    Squamous Epithelial / LPF MANY (*)     Bacteria, UA MANY (*)     All other components within normal limits  HEPATIC FUNCTION PANEL  LACTIC ACID, PLASMA  POCT I-STAT TROPONIN I  URINE CULTURE   Dg Chest 2 View  12/24/2011  *RADIOLOGY REPORT*  Clinical Data: Loss of consciousness  CHEST - 2 VIEW  Comparison: 04/04/2008  Findings:  Unchanged cardiac silhouette and mediastinal contours.  Unchanged retrocardiac air and fluid-filled opacity compatible with a hiatal hernia.  There is mild diffuse thickening of the pulmonary interstitium.  No focal airspace opacities.  No definite pleural effusion or pneumothorax.  Unchanged bones.  IMPRESSION:  1.  No acute cardiopulmonary disease.  2.  Hiatal hernia.   Original Report Authenticated By: Tacey Ruiz, MD      1. Syncope   2. Abdominal pain       MDM  71 y.o. female here after syncopal episode. Pt had abdominal pain just previous to event.  Pertinent past problems include anemia, COPD. Currently asymptomatic. Bedside US with no evidence for AAA. Plan for diagnostics including CT abd/pelvis. If unremarkable will recommend admit for syncope.   Data reviewed: EKG ordered and interpreted by me: NSR, no axis deviation, no QRS widening, nonspecific ST-T wave changes and no prior EKG available for comparison.  Lab tests ordered and reviewed by me: CBC unremarkable. BMP with slight increase in BUN/Cr. Troponin low. Lactic acid low. Lipase elevated. UA pending.  I independently viewed the following imaging studies and reviewed radiology's interpretation as summarized: CXR with  no acute disease. Known hiatal hernia present.  Course of care: Pt moved to CDU after report given. Pt and husband updated.   Medical Decision Making discussed with ED attending Doug Sou, MD  Charm Barges, MD 12/24/11 1907

## 2011-12-25 DIAGNOSIS — J449 Chronic obstructive pulmonary disease, unspecified: Secondary | ICD-10-CM

## 2011-12-25 DIAGNOSIS — R079 Chest pain, unspecified: Secondary | ICD-10-CM | POA: Diagnosis present

## 2011-12-25 DIAGNOSIS — R55 Syncope and collapse: Secondary | ICD-10-CM

## 2011-12-25 DIAGNOSIS — F411 Generalized anxiety disorder: Secondary | ICD-10-CM

## 2011-12-25 DIAGNOSIS — R52 Pain, unspecified: Secondary | ICD-10-CM

## 2011-12-25 DIAGNOSIS — K589 Irritable bowel syndrome without diarrhea: Secondary | ICD-10-CM

## 2011-12-25 DIAGNOSIS — R1013 Epigastric pain: Secondary | ICD-10-CM

## 2011-12-25 DIAGNOSIS — D649 Anemia, unspecified: Secondary | ICD-10-CM

## 2011-12-25 LAB — URINALYSIS, ROUTINE W REFLEX MICROSCOPIC
Bilirubin Urine: NEGATIVE
Hgb urine dipstick: NEGATIVE
Nitrite: POSITIVE — AB
Specific Gravity, Urine: 1.014 (ref 1.005–1.030)
Urobilinogen, UA: 0.2 mg/dL (ref 0.0–1.0)
pH: 5.5 (ref 5.0–8.0)

## 2011-12-25 LAB — IRON AND TIBC
Iron: 121 ug/dL (ref 42–135)
Saturation Ratios: 47 % (ref 20–55)
UIBC: 139 ug/dL (ref 125–400)

## 2011-12-25 LAB — FERRITIN: Ferritin: 461 ng/mL — ABNORMAL HIGH (ref 10–291)

## 2011-12-25 LAB — CBC
HCT: 24.3 % — ABNORMAL LOW (ref 36.0–46.0)
MCV: 100 fL (ref 78.0–100.0)
RBC: 2.43 MIL/uL — ABNORMAL LOW (ref 3.87–5.11)
WBC: 4.2 10*3/uL (ref 4.0–10.5)

## 2011-12-25 LAB — RETICULOCYTES: Retic Count, Absolute: 98.4 10*3/uL (ref 19.0–186.0)

## 2011-12-25 LAB — URINE MICROSCOPIC-ADD ON

## 2011-12-25 LAB — BASIC METABOLIC PANEL
CO2: 23 mEq/L (ref 19–32)
Chloride: 105 mEq/L (ref 96–112)
GFR calc Af Amer: 59 mL/min — ABNORMAL LOW (ref >90)
Potassium: 4.8 mEq/L (ref 3.5–5.1)
Sodium: 135 mEq/L (ref 135–145)

## 2011-12-25 MED ORDER — LISINOPRIL 20 MG PO TABS
20.0000 mg | ORAL_TABLET | Freq: Every day | ORAL | Status: DC
  Administered 2011-12-25 – 2011-12-26 (×2): 20 mg via ORAL
  Filled 2011-12-25 (×5): qty 1

## 2011-12-25 MED ORDER — HYDROCHLOROTHIAZIDE 12.5 MG PO CAPS
12.5000 mg | ORAL_CAPSULE | Freq: Every day | ORAL | Status: DC
  Administered 2011-12-25 – 2011-12-26 (×2): 12.5 mg via ORAL
  Filled 2011-12-25 (×5): qty 1

## 2011-12-25 MED ORDER — CIPROFLOXACIN IN D5W 400 MG/200ML IV SOLN
400.0000 mg | Freq: Two times a day (BID) | INTRAVENOUS | Status: DC
  Administered 2011-12-25 – 2011-12-26 (×3): 400 mg via INTRAVENOUS
  Filled 2011-12-25 (×7): qty 200

## 2011-12-25 MED ORDER — PANTOPRAZOLE SODIUM 40 MG IV SOLR
40.0000 mg | INTRAVENOUS | Status: DC
  Administered 2011-12-25 – 2011-12-26 (×2): 40 mg via INTRAVENOUS
  Filled 2011-12-25 (×3): qty 40

## 2011-12-25 NOTE — Consult Note (Signed)
Referring Provider: No ref. provider found Primary Care Physician:  Thora Lance, MD Primary Gastroenterologist:  Corinda Gubler  Reason for Consultation:  Epigastric pain and CT scan abnormalities  HPI: Nicole Meyer is a 71 y.o. female who was a passenger in the car when she developed severe epigastric pain. The pain was sudden onset, ranked 9/10 and sharp. She loss consciousness for approximate to 3-4 minutes. When she awoke, she had soreness in her stomach and some nausea. Her husband took her to the closest urgent care clinic, she was sent to the ER. The patient denies any chest pain, diaphoresis, she was not post ictal, she had no palpitations, no headache, no localized weakness, no seizure activity, no slurred speech. The patient does admit to intermittent epigastric pain and nausea for the past 3 months or so, but his is the worse that it has been.  Her last EGD was probably close to ten years ago she says; reports that it was performed by [facility].  Also says that she had a colonoscopy by our practice about 4 years ago, but I would need to look into older systems for those results.   She does have heartburn for which she takes Prilosec, she takes a baby aspirin daily. She doesn't eat spicy foods or take any other NSAIDs. The patient has a recent diagnosis of anemia that is being worked up as an outpatient. She had a bone marrow biopsy but once again no recent GI evaluation.  In the ER the patient was guaiac negative.  Hgb was 9.8 grams, now down to 8.3 grams today.  Otherwise no lab abnormalities.  CT scan showed irregular thickening of the distal esophagus with differential of esophagitis vs neoplasm, etc.  Today she reports that her abdomen is just "sore" all over the upper abdomen.  Denies bowel issues including dark or bloody stools at home.  Says that her appetite is ok but cannot eat as much as she used to.  No significant weight loss.  Past Medical History  Diagnosis Date  . Anemia     . Leukocytopenia   . HTN (hypertension)   . Asthma   . IBS (irritable bowel syndrome)   . Arthritis   . Anxiety   . Depression   . Chronic kidney disease (CKD), stage III (moderate)   . Hypercalcemia   . Lymphopenia 08/23/2011  . COPD (chronic obstructive pulmonary disease)   . Renal insufficiency     Past Surgical History  Procedure Date  . Abdominal hysterectomy     Prior to Admission medications   Medication Sig Start Date End Date Taking? Authorizing Provider  albuterol (PROVENTIL) (2.5 MG/3ML) 0.083% nebulizer solution Take 2.5 mg by nebulization every 6 (six) hours as needed. Wheezing and shortness of breath   Yes Historical Provider, MD  aspirin 81 MG tablet Take 81 mg by mouth daily.   Yes Historical Provider, MD  Calcium Carb-Cholecalciferol (CALCIUM + D3) 600-200 MG-UNIT TABS Take 1 tablet by mouth daily.   Yes Historical Provider, MD  lisinopril-hydrochlorothiazide (PRINZIDE,ZESTORETIC) 20-12.5 MG per tablet Take 1 tablet by mouth daily with breakfast.    Yes Historical Provider, MD  Multiple Vitamin (MULTIVITAMIN) tablet Take 1 tablet by mouth daily.   Yes Historical Provider, MD  omeprazole (PRILOSEC) 20 MG capsule Take 20 mg by mouth daily.   Yes Historical Provider, MD    Current Facility-Administered Medications  Medication Dose Route Frequency Provider Last Rate Last Dose  . 0.9 %  sodium chloride infusion  Intravenous Continuous Debby Crosley, MD 75 mL/hr at 12/25/11 0000    . acetaminophen (TYLENOL) tablet 650 mg  650 mg Oral Q6H PRN Debby Crosley, MD       Or  . acetaminophen (TYLENOL) suppository 650 mg  650 mg Rectal Q6H PRN Debby Crosley, MD      . alum & mag hydroxide-simeth (MAALOX/MYLANTA) 200-200-20 MG/5ML suspension 30 mL  30 mL Oral Q6H PRN Debby Crosley, MD      . ciprofloxacin (CIPRO) IVPB 400 mg  400 mg Intravenous Q12H Ripudeep K Rai, MD      . enoxaparin (LOVENOX) injection 40 mg  40 mg Subcutaneous Q24H Debby Crosley, MD      . lisinopril  (PRINIVIL,ZESTRIL) tablet 20 mg  20 mg Oral QAC breakfast Debby Crosley, MD   20 mg at 12/25/11 0738   And  . hydrochlorothiazide (MICROZIDE) capsule 12.5 mg  12.5 mg Oral QAC breakfast Debby Crosley, MD   12.5 mg at 12/25/11 0738  . HYDROcodone-acetaminophen (NORCO/VICODIN) 5-325 MG per tablet 1-2 tablet  1-2 tablet Oral Q4H PRN Debby Crosley, MD      . [EXPIRED] iohexol (OMNIPAQUE) 300 MG/ML solution 20 mL  20 mL Oral Q1 Hr x 2 Sam Jacubowitz, MD      . [COMPLETED] iohexol (OMNIPAQUE) 300 MG/ML solution 80 mL  80 mL Intravenous Once PRN Doug Sou, MD   80 mL at 12/24/11 1939  . morphine 2 MG/ML injection 1 mg  1 mg Intravenous Q4H PRN Debby Crosley, MD      . ondansetron (ZOFRAN) tablet 4 mg  4 mg Oral Q6H PRN Debby Crosley, MD       Or  . ondansetron (ZOFRAN) injection 4 mg  4 mg Intravenous Q6H PRN Debby Crosley, MD      . pantoprazole (PROTONIX) injection 40 mg  40 mg Intravenous Q24H Debby Crosley, MD      . senna-docusate (Senokot-S) tablet 1 tablet  1 tablet Oral QHS PRN Debby Crosley, MD      . [COMPLETED] sodium chloride 0.9 % bolus 500 mL  500 mL Intravenous Once Charm Barges, MD   500 mL at 12/24/11 1753  . sodium chloride 0.9 % injection 3 mL  3 mL Intravenous Q12H Debby Crosley, MD   3 mL at 12/25/11 0000  . [DISCONTINUED] lisinopril-hydrochlorothiazide (PRINZIDE,ZESTORETIC) 20-12.5 MG per tablet 1 tablet  1 tablet Oral Q breakfast Debby Crosley, MD      . [DISCONTINUED] pantoprazole (PROTONIX) injection 40 mg  40 mg Intravenous Q24H Debby Crosley, MD        Allergies as of 12/24/2011 - Review Complete 12/24/2011  Allergen Reaction Noted  . Brovana (arformoterol) Other (See Comments) 08/26/2011  . Penicillins Hives 08/26/2011  . Paxil (paroxetine hcl) Rash 08/26/2011    Family History  Problem Relation Age of Onset  . Diabetes Father   . Heart disease Father   . Cancer Maternal Aunt     Breast cancer    History   Social History  . Marital Status: Married     Spouse Name: N/A    Number of Children: 2  . Years of Education: N/A   Occupational History  .      retired Armed forces training and education officer   Social History Main Topics  . Smoking status: Former Smoker -- 1.0 packs/day for 40 years    Quit date: 02/19/2000  . Smokeless tobacco: Not on file  . Alcohol Use: No  . Drug Use: No  . Sexually Active:  Not Currently    Birth Control/ Protection: Post-menopausal   Other Topics Concern  . Not on file   Social History Narrative  . No narrative on file    Review of Systems: Ten point ROS is O/W negative except as mentioned in HPI.  Physical Exam: Vital signs in last 24 hours: Temp:  [97.8 F (36.6 C)-98.4 F (36.9 C)] 98 F (36.7 C) (11/06 0500) Pulse Rate:  [66-94] 74  (11/06 0500) Resp:  [14-22] 18  (11/06 0500) BP: (102-139)/(52-84) 102/64 mmHg (11/06 0738) SpO2:  [96 %-100 %] 98 % (11/06 0500) Weight:  [192 lb (87.091 kg)] 192 lb (87.091 kg) (11/05 2358) Last BM Date: 12/24/11 General:   Alert, Well-developed, well-nourished, pleasant and cooperative in NAD Head:  Normocephalic and atraumatic. Eyes:  Sclera clear, no icterus.  Conjunctiva pink. Ears:  Normal auditory acuity. Mouth:  No deformity or lesions.   Lungs:  Clear throughout to auscultation.  No wheezes, crackles, or rhonchi.  Heart:  Regular rate and rhythm; no murmurs, clicks, rubs,  or gallops. Abdomen:  Soft, non-distendecd, BS active, epigastric and LUQ TTP without R/R/G. Rectal:  Deferred.  Msk:  Symmetrical without gross deformities. Pulses:  Normal pulses noted. Extremities:  Without clubbing or edema. Neurologic:  Alert and  oriented x4;  grossly normal neurologically. Skin:  Intact without significant lesions or rashes. Psych:  Alert and cooperative. Normal mood and affect.  Lab Results:  [facility] 12/25/11 0605 12/24/11 1548  WBC 4.2 6.6  HGB 8.3* 9.8*  HCT 24.3* 29.0*  PLT 175 223   BMET  Basename 12/25/11 0605 12/24/11 1548  NA 135 136  K 4.8 4.5   CL 105 104  CO2 23 21  GLUCOSE 107* 99  BUN 21 26*  CREATININE 1.07 1.11*  CALCIUM 9.3 10.1   LFT  Basename 12/24/11 1743  PROT 7.6  ALBUMIN 4.0  AST 23  ALT 24  ALKPHOS 64  BILITOT 1.1  BILIDIR 0.2  IBILI 0.9   Studies/Results: Dg Chest 2 View  12/24/2011  *RADIOLOGY REPORT*  Clinical Data: Loss of consciousness  CHEST - 2 VIEW  Comparison: 04/04/2008  Findings:  Unchanged cardiac silhouette and mediastinal contours.  Unchanged retrocardiac air and fluid-filled opacity compatible with a hiatal hernia.  There is mild diffuse thickening of the pulmonary interstitium.  No focal airspace opacities.  No definite pleural effusion or pneumothorax.  Unchanged bones.  IMPRESSION:  1.  No acute cardiopulmonary disease.  2.  Hiatal hernia.   Original Report Authenticated By: Tacey Ruiz, MD    Ct Abdomen Pelvis W Contrast  12/24/2011  *RADIOLOGY REPORT*  Clinical Data: Severe mid epigastric abdominal pain and nausea, after which the patient became syncopal.  CT ABDOMEN AND PELVIS WITH CONTRAST  Technique:  Multidetector CT imaging of the abdomen and pelvis was performed following the standard protocol during bolus administration of intravenous contrast.  Contrast: 80mL OMNIPAQUE IOHEXOL 300 MG/ML. Oral contrast was also administered.  Comparison: None.  Findings: Moderate sized hiatal hernia, with approximately one third of the stomach present within the chest.  Marked thickening of the wall of the visualized distal esophagus, though the wall thickening is asymmetric and increased along the right lateral wall.  Stomach otherwise normal in appearance.  Mild wall thickening involving the terminal ileum, with fat in the wall. Remaining small bowel normal in appearance.  Scattered diverticula involving the sigmoid colon without evidence of acute diverticulitis; colon otherwise normal in appearance.  Lipoma involving the ileocecal valve.  Appendix not identified, but no pericecal inflammation. No  ascites.  Normal appearing liver, spleen, pancreas, adrenal glands, and kidneys.  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  No significant lymphadenopathy.  Gallbladder unremarkable by CT.  No biliary ductal dilation.  Uterus surgically absent.  No adnexal masses or free pelvic fluid. Very small diverticulum arising from the left lateral wall of the bladder at its base.  Bone window images demonstrate degenerative changes involving the lower lumbar spine.  Visualized lung bases clear.  IMPRESSION:  1.  Moderate sized hiatal hernia.  Asymmetric wall thickening involving the visualized distal esophagus; while this may be due to esophagitis and chronic GE reflux disease, the asymmetry places neoplasm in the differential diagnosis.  Upper endoscopy is suggested in further evaluation. 2.  No acute abnormalities involving the abdomen or pelvis. 3.  Wall thickening involving the terminal ileum, with fat in the wall suggesting chronic inflammation.  No evidence of acute inflammatory changes. 4.  Very small diverticulum arising from the left lateral wall of the urinary bladder at its base. 5.  Scattered sigmoid colon diverticula without evidence of acute diverticulitis.   Original Report Authenticated By: Hulan Saas, [name]     IMPRESSION:  -Epigastric pain and nausea x 3 months, but worsened acutely  -CT scan abnormality with thickened distal esophagus. -Moderate size hiatal hernia. -Syncope:  Related to pain.  PLAN: -Will plan for EGD 11/7. -Continue daily IV pantoprazole for now. -Can have diet as tolerated for now. -Anti-emetics and pain control.  Edwardine Deschepper D.  12/25/2011, 11:46 AM  Pager number [phone]

## 2011-12-25 NOTE — Consult Note (Signed)
Chart was reviewed and patient was examined. X-rays were reviewed.    Symptoms and xray findings of a large hiatal hernia raises the question of temporary incarceration of the stomach, as can be seen with a gastric volvulus or paraesophageal hiatal hernia.  Neoplasm is less likely in the absence of chronic dysphagia with a thickened distal esophagus.  Agree with EGD  Barbette Hair. Arlyce Dice, [name], [facility] [phone]

## 2011-12-25 NOTE — Progress Notes (Signed)
Patient ID: Nicole Meyer  female  MRN:[id number]    DOB: 07/04/1940    DOA: 12/24/2011  PCP: EHINGER,ROBERT R, MD  Assessment/Plan:  Syncope with epigastric pain - 2-D echo ordered today - Given CT scan findings with a hiatal hernia and possibly esophagitis, GI consult obtained, plan for EGD 11/7 - Continue PPI, n.p.o. After MN  Active Problems:  Anemia: Continue PPI, EGD tomorrow   HTN (hypertension): Stable, continue antihypertensives    IBS (irritable bowel syndrome)   Chronic kidney disease (CKD), stage III (moderate): Monitor creatinine function   COPD (chronic obstructive pulmonary disease): stable  DVT Prophylaxis: SCD's  Code Status:  Disposition: pending EGD    Subjective: Patient seen earlier this AM, no complaints     Objective: Weight change:  No intake or output data in the 24 hours ending 12/25/11 1631 Blood pressure 125/69, pulse 70, temperature 98.5 F (36.9 C), temperature source Oral, resp. rate 17, height 5' 3" (1.6 m), weight 87.091 kg (192 lb), SpO2 96.00%.  Physical Exam: General: Alert and awake, oriented x3, not in any acute distress. HEENT: anicteric sclera, pupils reactive to light and accommodation, EOMI CVS: S1-S2 clear, no murmur rubs or gallops Chest: clear to auscultation bilaterally, no wheezing, rales or rhonchi Abdomen: soft nontender, nondistended, normal bowel sounds, no organomegaly Extremities: no cyanosis, clubbing or edema noted bilaterally Neuro: Cranial nerves II-XII intact, no focal neurological deficits  Lab Results: Basic Metabolic Panel:  Lab 12/25/11 0605 12/24/11 1548  NA 135 136  K 4.8 4.5  CL 105 104  CO2 23 21  GLUCOSE 107* 99  BUN 21 26*  CREATININE 1.07 1.11*  CALCIUM 9.3 10.1  MG -- --  PHOS -- --   Liver Function Tests:  Lab 12/24/11 1743  AST 23  ALT 24  ALKPHOS 64  BILITOT 1.1  PROT 7.6  ALBUMIN 4.0    Lab 12/24/11 1743  LIPASE 64*  AMYLASE --   No results found for this basename:  AMMONIA:2 in the last 168 hours CBC:  Lab 12/25/11 0605 12/24/11 1548  WBC 4.2 6.6  NEUTROABS -- 5.5  HGB 8.3* 9.8*  HCT 24.3* 29.0*  MCV 100.0 99.7  PLT 175 223   Cardiac Enzymes: No results found for this basename: CKTOTAL:3,CKMB:3,CKMBINDEX:3,TROPONINI:3 in the last 168 hours BNP: No components found with this basename: POCBNP:2 CBG: No results found for this basename: GLUCAP:5 in the last 168 hours   Micro Results: No results found for this or any previous visit (from the past 240 hour(s)).  Studies/Results: Dg Chest 2 View  12/24/2011  *RADIOLOGY REPORT*  Clinical Data: Loss of consciousness  CHEST - 2 VIEW  Comparison: 04/04/2008  Findings:  Unchanged cardiac silhouette and mediastinal contours.  Unchanged retrocardiac air and fluid-filled opacity compatible with a hiatal hernia.  There is mild diffuse thickening of the pulmonary interstitium.  No focal airspace opacities.  No definite pleural effusion or pneumothorax.  Unchanged bones.  IMPRESSION:  1.  No acute cardiopulmonary disease.  2.  Hiatal hernia.   Original Report Authenticated By: John Watts V, MD    Ct Abdomen Pelvis W Contrast  12/24/2011  *RADIOLOGY REPORT*  Clinical Data: Severe mid epigastric abdominal pain and nausea, after which the patient became syncopal.  CT ABDOMEN AND PELVIS WITH CONTRAST  Technique:  Multidetector CT imaging of the abdomen and pelvis was performed following the standard protocol during bolus administration of intravenous contrast.  Contrast: 80mL OMNIPAQUE IOHEXOL 300 MG/ML. Oral contrast was also administered.    Comparison: None.  Findings: Moderate sized hiatal hernia, with approximately one third of the stomach present within the chest.  Marked thickening of the wall of the visualized distal esophagus, though the wall thickening is asymmetric and increased along the right lateral wall.  Stomach otherwise normal in appearance.  Mild wall thickening involving the terminal ileum, with fat in  the wall. Remaining small bowel normal in appearance.  Scattered diverticula involving the sigmoid colon without evidence of acute diverticulitis; colon otherwise normal in appearance.  Lipoma involving the ileocecal valve.  Appendix not identified, but no pericecal inflammation. No ascites.  Normal appearing liver, spleen, pancreas, adrenal glands, and kidneys.  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  No significant lymphadenopathy.  Gallbladder unremarkable by CT.  No biliary ductal dilation.  Uterus surgically absent.  No adnexal masses or free pelvic fluid. Very small diverticulum arising from the left lateral wall of the bladder at its base.  Bone window images demonstrate degenerative changes involving the lower lumbar spine.  Visualized lung bases clear.  IMPRESSION:  1.  Moderate sized hiatal hernia.  Asymmetric wall thickening involving the visualized distal esophagus; while this may be due to esophagitis and chronic GE reflux disease, the asymmetry places neoplasm in the differential diagnosis.  Upper endoscopy is suggested in further evaluation. 2.  No acute abnormalities involving the abdomen or pelvis. 3.  Wall thickening involving the terminal ileum, with fat in the wall suggesting chronic inflammation.  No evidence of acute inflammatory changes. 4.  Very small diverticulum arising from the left lateral wall of the urinary bladder at its base. 5.  Scattered sigmoid colon diverticula without evidence of acute diverticulitis.   Original Report Authenticated By: Thomas Lawrence, [name]     Medications: Scheduled Meds:   . ciprofloxacin  400 mg Intravenous Q12H  . lisinopril  20 mg Oral QAC breakfast   And  . hydrochlorothiazide  12.5 mg Oral QAC breakfast  . [EXPIRED] iohexol  20 mL Oral Q1 Hr x 2  . pantoprazole (PROTONIX) IV  40 mg Intravenous Q24H  . [COMPLETED] sodium chloride  500 mL Intravenous Once  . sodium chloride  3 mL Intravenous Q12H  . [DISCONTINUED]  enoxaparin (LOVENOX) injection  40 mg Subcutaneous Q24H  . [DISCONTINUED] lisinopril-hydrochlorothiazide  1 tablet Oral Q breakfast  . [DISCONTINUED] pantoprazole (PROTONIX) IV  40 mg Intravenous Q24H      LOS: 1 day   Elmira Olkowski [name] [facility] 12/25/2011, 4:31 PM Pager: [phone]  If 7PM-7AM, please contact night-coverage [email or web address] Password [email or web address]  

## 2011-12-25 NOTE — ED Provider Notes (Signed)
I have personally seen and examined the patient.  I have discussed the plan of care with the resident.  I have reviewed the documentation on PMH/FH/Soc. History.  I have reviewed the documentation of the resident and agree.  Doug Sou, MD 12/25/11 0040

## 2011-12-26 ENCOUNTER — Encounter (HOSPITAL_COMMUNITY): Payer: Self-pay

## 2011-12-26 ENCOUNTER — Encounter (HOSPITAL_COMMUNITY)
Admission: EM | Disposition: A | Discharge status: Home / Self Care | Payer: Self-pay | Source: Home / Self Care | Attending: Internal Medicine

## 2011-12-26 DIAGNOSIS — K440 Diaphragmatic hernia with obstruction, without gangrene: Secondary | ICD-10-CM | POA: Diagnosis present

## 2011-12-26 DIAGNOSIS — N390 Urinary tract infection, site not specified: Secondary | ICD-10-CM

## 2011-12-26 HISTORY — PX: ESOPHAGOGASTRODUODENOSCOPY: SHX5428

## 2011-12-26 HISTORY — DX: Urinary tract infection, site not specified: N39.0

## 2011-12-26 LAB — URINE CULTURE: Colony Count: >=100000

## 2011-12-26 SURGERY — EGD (ESOPHAGOGASTRODUODENOSCOPY)
Anesthesia: Moderate Sedation

## 2011-12-26 MED ORDER — FENTANYL CITRATE 0.05 MG/ML IJ SOLN
INTRAMUSCULAR | Status: DC | PRN
  Administered 2011-12-26 (×2): 25 ug via INTRAVENOUS

## 2011-12-26 MED ORDER — GLYCOPYRROLATE 0.2 MG/ML IJ SOLN
INTRAMUSCULAR | Status: AC
  Filled 2011-12-26: qty 1

## 2011-12-26 MED ORDER — SODIUM CHLORIDE 0.9 % IV SOLN
INTRAVENOUS | Status: DC
  Administered 2011-12-26: 04:00:00 via INTRAVENOUS

## 2011-12-26 MED ORDER — MIDAZOLAM HCL 5 MG/5ML IJ SOLN
INTRAMUSCULAR | Status: DC | PRN
  Administered 2011-12-26 (×2): 2 mg via INTRAVENOUS

## 2011-12-26 MED ORDER — FENTANYL CITRATE 0.05 MG/ML IJ SOLN
INTRAMUSCULAR | Status: AC
  Filled 2011-12-26: qty 2

## 2011-12-26 MED ORDER — MIDAZOLAM HCL 5 MG/ML IJ SOLN
INTRAMUSCULAR | Status: AC
  Filled 2011-12-26: qty 2

## 2011-12-26 MED ORDER — GLYCOPYRROLATE 0.2 MG/ML IJ SOLN
INTRAMUSCULAR | Status: DC | PRN
  Administered 2011-12-26: 0.2 mg via INTRAVENOUS

## 2011-12-26 MED ORDER — BUTAMBEN-TETRACAINE-BENZOCAINE 2-2-14 % EX AERO
INHALATION_SPRAY | CUTANEOUS | Status: DC | PRN
  Administered 2011-12-26: 2 via TOPICAL

## 2011-12-26 NOTE — Progress Notes (Signed)
Patient ID: Nicole Meyer  female  ZOX:[id number]    DOB: 1940/03/16    DOA: 12/24/2011  PCP: Thora Lance, MD  Assessment/Plan:  Syncope with epigastric pain - 2-D echo ordered, still pending  - Given CT scan findings with a hiatal hernia and possibly esophagitis, EGD 11/7  Hiatal hernia:  Possible volvulus -EGD done acutely angled distal esophagus and a large (7 cm) highly hernia - UGI series oredred   Anemia: Continue PPI   HTN (hypertension): Stable, continue antihypertensives    IBS (irritable bowel syndrome)   Chronic kidney disease (CKD), stage III (moderate): Monitor creatinine function   COPD (chronic obstructive pulmonary disease): stable  UTI: Urine culture positive for Ecoli - Cont cipro, day 2, follow sensitivities  DVT Prophylaxis: SCD's  Code Status:  Disposition: pending UGIS and ECHO    Subjective: Patient seen earlier this AM, no complaints, awaiting EGD     Objective: Weight change:   Intake/Output Summary (Last 24 hours) at 12/26/11 1328 Last data filed at 12/25/11 1900  Gross per 24 hour  Intake    900 ml  Output      0 ml  Net    900 ml   Blood pressure 119/57, pulse 72, temperature 97.8 F (36.6 C), temperature source Oral, resp. rate 24, height 5\' 3"  (1.6 m), weight 87.091 kg (192 lb), SpO2 97.00%.  Physical Exam: General: Alert and awake, oriented x3, NAD. HEENT: anicteric sclera, pupils reactive to light and accommodation, EOMI CVS: S1-S2 clear, no murmur rubs or gallops Chest: CTAB, no wheezing, rales or rhonchi Abdomen: soft nontender, nondistended, normal bowel sounds, no organomegaly Extremities: no cyanosis, clubbing or edema noted bilaterally   Lab Results: Basic Metabolic Panel:  Lab 12/25/11 8119 12/24/11 1548  NA 135 136  K 4.8 4.5  CL 105 104  CO2 23 21  GLUCOSE 107* 99  BUN 21 26*  CREATININE 1.07 1.11*  CALCIUM 9.3 10.1  MG -- --  PHOS -- --   Liver Function Tests:  Lab 12/24/11 1743  AST 23  ALT  24  ALKPHOS 64  BILITOT 1.1  PROT 7.6  ALBUMIN 4.0    Lab 12/24/11 1743  LIPASE 64*  AMYLASE --   No results found for this basename: AMMONIA:2 in the last 168 hours CBC:  Lab 12/25/11 0605 12/24/11 1548  WBC 4.2 6.6  NEUTROABS -- 5.5  HGB 8.3* 9.8*  HCT 24.3* 29.0*  MCV 100.0 99.7  PLT 175 223   Cardiac Enzymes: No results found for this basename: CKTOTAL:3,CKMB:3,CKMBINDEX:3,TROPONINI:3 in the last 168 hours BNP: No components found with this basename: POCBNP:2 CBG: No results found for this basename: GLUCAP:5 in the last 168 hours   Micro Results: Recent Results (from the past 240 hour(s))  URINE CULTURE     Status: Normal (Preliminary result)   Collection Time   12/24/11  5:38 PM      Component Value Range Status Comment   Specimen Description URINE, CLEAN CATCH   Final    Special Requests NONE   Final    Culture  Setup Time 12/24/2011 20:21   Final    Colony Count >=100,000 COLONIES/ML   Final    Culture ESCHERICHIA COLI   Final    Report Status PENDING   Incomplete   URINE CULTURE     Status: Normal (Preliminary result)   Collection Time   12/25/11 11:51 AM      Component Value Range Status Comment   Specimen Description URINE, RANDOM  Final    Special Requests NONE   Final    Culture  Setup Time 12/25/2011 12:42   Final    Colony Count >=100,000 COLONIES/ML   Final    Culture ESCHERICHIA COLI   Final    Report Status PENDING   Incomplete     Studies/Results: Dg Chest 2 View  12/24/2011  *RADIOLOGY REPORT*  Clinical Data: Loss of consciousness  CHEST - 2 VIEW  Comparison: 04/04/2008  Findings:  Unchanged cardiac silhouette and mediastinal contours.  Unchanged retrocardiac air and fluid-filled opacity compatible with a hiatal hernia.  There is mild diffuse thickening of the pulmonary interstitium.  No focal airspace opacities.  No definite pleural effusion or pneumothorax.  Unchanged bones.  IMPRESSION:  1.  No acute cardiopulmonary disease.  2.  Hiatal  hernia.   Original Report Authenticated By: Tacey Ruiz, MD    Ct Abdomen Pelvis W Contrast  12/24/2011  *RADIOLOGY REPORT*  Clinical Data: Severe mid epigastric abdominal pain and nausea, after which the patient became syncopal.  CT ABDOMEN AND PELVIS WITH CONTRAST  Technique:  Multidetector CT imaging of the abdomen and pelvis was performed following the standard protocol during bolus administration of intravenous contrast.  Contrast: 80mL OMNIPAQUE IOHEXOL 300 MG/ML. Oral contrast was also administered.  Comparison: None.  Findings: Moderate sized hiatal hernia, with approximately one third of the stomach present within the chest.  Marked thickening of the wall of the visualized distal esophagus, though the wall thickening is asymmetric and increased along the right lateral wall.  Stomach otherwise normal in appearance.  Mild wall thickening involving the terminal ileum, with fat in the wall. Remaining small bowel normal in appearance.  Scattered diverticula involving the sigmoid colon without evidence of acute diverticulitis; colon otherwise normal in appearance.  Lipoma involving the ileocecal valve.  Appendix not identified, but no pericecal inflammation. No ascites.  Normal appearing liver, spleen, pancreas, adrenal glands, and kidneys.  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  No significant lymphadenopathy.  Gallbladder unremarkable by CT.  No biliary ductal dilation.  Uterus surgically absent.  No adnexal masses or free pelvic fluid. Very small diverticulum arising from the left lateral wall of the bladder at its base.  Bone window images demonstrate degenerative changes involving the lower lumbar spine.  Visualized lung bases clear.  IMPRESSION:  1.  Moderate sized hiatal hernia.  Asymmetric wall thickening involving the visualized distal esophagus; while this may be due to esophagitis and chronic GE reflux disease, the asymmetry places neoplasm in the differential diagnosis.  Upper  endoscopy is suggested in further evaluation. 2.  No acute abnormalities involving the abdomen or pelvis. 3.  Wall thickening involving the terminal ileum, with fat in the wall suggesting chronic inflammation.  No evidence of acute inflammatory changes. 4.  Very small diverticulum arising from the left lateral wall of the urinary bladder at its base. 5.  Scattered sigmoid colon diverticula without evidence of acute diverticulitis.   Original Report Authenticated By: Hulan Saas, [name]     Medications: Scheduled Meds:    . ciprofloxacin  400 mg Intravenous Q12H  . lisinopril  20 mg Oral QAC breakfast   And  . hydrochlorothiazide  12.5 mg Oral QAC breakfast  . pantoprazole (PROTONIX) IV  40 mg Intravenous Q24H  . sodium chloride  3 mL Intravenous Q12H  . [DISCONTINUED] enoxaparin (LOVENOX) injection  40 mg Subcutaneous Q24H      LOS: 2 days   RAI,RIPUDEEP [name] [facility] 12/26/2011, 1:28 PM  Pager: [phone]  If 7PM-7AM, please contact night-coverage [email or web address] Password [email or web address]

## 2011-12-26 NOTE — Op Note (Signed)
[name] [address] [place], [address]   ENDOSCOPY PROCEDURE REPORT  PATIENT: Nicole Meyer, Nicole Meyer  MR#: [id number] BIRTHDATE: December 10, 1940 , 71  yrs. old GENDER: Female ENDOSCOPIST: Louis Meckel, MD REFERRED BY:  Blair Heys, [name] PROCEDURE DATE:  12/26/2011 PROCEDURE:  EGD, diagnostic ASA CLASS:     Class II INDICATIONS:  chest pain. MEDICATIONS: These medications were titrated to patient response per physician's verbal order, Versed 4 mg IV, Fentanyl 50 mcg IV, and Robinul 0.2 mg IV TOPICAL ANESTHETIC: Cetacaine Spray  DESCRIPTION OF PROCEDURE: After the risks benefits and alternatives of the procedure were thoroughly explained, informed consent was obtained.  The Pentax Gastroscope [id number] endoscope was introduced through the mouth and advanced to the second portion of the duodenum. Without limitations.  The instrument was slowly withdrawn as the mucosa was fully examined.      There was a large hiatal hernia measuring approximately 7 cm. The distal esophagus was acutely angulated where it entered the stomach.   There was a large hiatal hernia measuring approximately 7 cm. The distal esophagus was acutely angulated where it entered the stomach.   The remainder of the upper endoscopy exam was otherwise normal.  Retroflexed views revealed no abnormalities.     The scope was then withdrawn from the patient and the procedure completed.  COMPLICATIONS: There were no complications. ENDOSCOPIC IMPRESSION: Large sliding hiatal hernia  RECOMMENDATIONS: #1 upper GI series REPEAT EXAM:  eSigned:  Louis Meckel, MD 12/26/2011 11:16 AM   CC:

## 2011-12-26 NOTE — H&P (View-Only) (Signed)
Patient ID: Nicole Meyer  female  ZOX:[id number]    DOB: 03/06/1940    DOA: 12/24/2011  PCP: Thora Lance, MD  Assessment/Plan:  Syncope with epigastric pain - 2-D echo ordered today - Given CT scan findings with a hiatal hernia and possibly esophagitis, GI consult obtained, plan for EGD 11/7 - Continue PPI, n.p.o. After MN  Active Problems:  Anemia: Continue PPI, EGD tomorrow   HTN (hypertension): Stable, continue antihypertensives    IBS (irritable bowel syndrome)   Chronic kidney disease (CKD), stage III (moderate): Monitor creatinine function   COPD (chronic obstructive pulmonary disease): stable  DVT Prophylaxis: SCD's  Code Status:  Disposition: pending EGD    Subjective: Patient seen earlier this AM, no complaints     Objective: Weight change:  No intake or output data in the 24 hours ending 12/25/11 1631 Blood pressure 125/69, pulse 70, temperature 98.5 F (36.9 C), temperature source Oral, resp. rate 17, height 5\' 3"  (1.6 m), weight 87.091 kg (192 lb), SpO2 96.00%.  Physical Exam: General: Alert and awake, oriented x3, not in any acute distress. HEENT: anicteric sclera, pupils reactive to light and accommodation, EOMI CVS: S1-S2 clear, no murmur rubs or gallops Chest: clear to auscultation bilaterally, no wheezing, rales or rhonchi Abdomen: soft nontender, nondistended, normal bowel sounds, no organomegaly Extremities: no cyanosis, clubbing or edema noted bilaterally Neuro: Cranial nerves II-XII intact, no focal neurological deficits  Lab Results: Basic Metabolic Panel:  Lab 12/25/11 8119 12/24/11 1548  NA 135 136  K 4.8 4.5  CL 105 104  CO2 23 21  GLUCOSE 107* 99  BUN 21 26*  CREATININE 1.07 1.11*  CALCIUM 9.3 10.1  MG -- --  PHOS -- --   Liver Function Tests:  Lab 12/24/11 1743  AST 23  ALT 24  ALKPHOS 64  BILITOT 1.1  PROT 7.6  ALBUMIN 4.0    Lab 12/24/11 1743  LIPASE 64*  AMYLASE --   No results found for this basename:  AMMONIA:2 in the last 168 hours CBC:  Lab 12/25/11 0605 12/24/11 1548  WBC 4.2 6.6  NEUTROABS -- 5.5  HGB 8.3* 9.8*  HCT 24.3* 29.0*  MCV 100.0 99.7  PLT 175 223   Cardiac Enzymes: No results found for this basename: CKTOTAL:3,CKMB:3,CKMBINDEX:3,TROPONINI:3 in the last 168 hours BNP: No components found with this basename: POCBNP:2 CBG: No results found for this basename: GLUCAP:5 in the last 168 hours   Micro Results: No results found for this or any previous visit (from the past 240 hour(s)).  Studies/Results: Dg Chest 2 View  12/24/2011  *RADIOLOGY REPORT*  Clinical Data: Loss of consciousness  CHEST - 2 VIEW  Comparison: 04/04/2008  Findings:  Unchanged cardiac silhouette and mediastinal contours.  Unchanged retrocardiac air and fluid-filled opacity compatible with a hiatal hernia.  There is mild diffuse thickening of the pulmonary interstitium.  No focal airspace opacities.  No definite pleural effusion or pneumothorax.  Unchanged bones.  IMPRESSION:  1.  No acute cardiopulmonary disease.  2.  Hiatal hernia.   Original Report Authenticated By: Tacey Ruiz, MD    Ct Abdomen Pelvis W Contrast  12/24/2011  *RADIOLOGY REPORT*  Clinical Data: Severe mid epigastric abdominal pain and nausea, after which the patient became syncopal.  CT ABDOMEN AND PELVIS WITH CONTRAST  Technique:  Multidetector CT imaging of the abdomen and pelvis was performed following the standard protocol during bolus administration of intravenous contrast.  Contrast: 80mL OMNIPAQUE IOHEXOL 300 MG/ML. Oral contrast was also administered.  Comparison: None.  Findings: Moderate sized hiatal hernia, with approximately one third of the stomach present within the chest.  Marked thickening of the wall of the visualized distal esophagus, though the wall thickening is asymmetric and increased along the right lateral wall.  Stomach otherwise normal in appearance.  Mild wall thickening involving the terminal ileum, with fat in  the wall. Remaining small bowel normal in appearance.  Scattered diverticula involving the sigmoid colon without evidence of acute diverticulitis; colon otherwise normal in appearance.  Lipoma involving the ileocecal valve.  Appendix not identified, but no pericecal inflammation. No ascites.  Normal appearing liver, spleen, pancreas, adrenal glands, and kidneys.  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  No significant lymphadenopathy.  Gallbladder unremarkable by CT.  No biliary ductal dilation.  Uterus surgically absent.  No adnexal masses or free pelvic fluid. Very small diverticulum arising from the left lateral wall of the bladder at its base.  Bone window images demonstrate degenerative changes involving the lower lumbar spine.  Visualized lung bases clear.  IMPRESSION:  1.  Moderate sized hiatal hernia.  Asymmetric wall thickening involving the visualized distal esophagus; while this may be due to esophagitis and chronic GE reflux disease, the asymmetry places neoplasm in the differential diagnosis.  Upper endoscopy is suggested in further evaluation. 2.  No acute abnormalities involving the abdomen or pelvis. 3.  Wall thickening involving the terminal ileum, with fat in the wall suggesting chronic inflammation.  No evidence of acute inflammatory changes. 4.  Very small diverticulum arising from the left lateral wall of the urinary bladder at its base. 5.  Scattered sigmoid colon diverticula without evidence of acute diverticulitis.   Original Report Authenticated By: Hulan Saas, [name]     Medications: Scheduled Meds:   . ciprofloxacin  400 mg Intravenous Q12H  . lisinopril  20 mg Oral QAC breakfast   And  . hydrochlorothiazide  12.5 mg Oral QAC breakfast  . [EXPIRED] iohexol  20 mL Oral Q1 Hr x 2  . pantoprazole (PROTONIX) IV  40 mg Intravenous Q24H  . [COMPLETED] sodium chloride  500 mL Intravenous Once  . sodium chloride  3 mL Intravenous Q12H  . [DISCONTINUED]  enoxaparin (LOVENOX) injection  40 mg Subcutaneous Q24H  . [DISCONTINUED] lisinopril-hydrochlorothiazide  1 tablet Oral Q breakfast  . [DISCONTINUED] pantoprazole (PROTONIX) IV  40 mg Intravenous Q24H      LOS: 1 day   RAI,RIPUDEEP [name] [facility] 12/25/2011, 4:31 PM Pager: [phone]  If 7PM-7AM, please contact night-coverage [email or web address] Password [email or web address]

## 2011-12-26 NOTE — Progress Notes (Signed)
  Echocardiogram 2D Echocardiogram has been performed.  Nicole Meyer 12/26/2011, 3:19 PM

## 2011-12-26 NOTE — Interval H&P Note (Signed)
History and Physical Interval Note:  12/26/2011 10:58 AM  Nicole Meyer  has presented today for surgery, with the diagnosis of Epigastric pain and abnormal CT scan  The various methods of treatment have been discussed with the patient and family. After consideration of risks, benefits and other options for treatment, the patient has consented to  Procedure(s) (LRB) with comments: ESOPHAGOGASTRODUODENOSCOPY (EGD) (N/A) as a surgical intervention .  The patient's history has been reviewed, patient examined, no change in status, stable for surgery.  I have reviewed the patient's chart and labs.  Questions were answered to the patient's satisfaction.     The recent H&P (dated *12/26/11**) was reviewed, the patient was examined and there is no change in the patients condition since that H&P was completed.   Melvia Heaps  12/26/2011, 10:59 AM   Melvia Heaps

## 2011-12-26 NOTE — Progress Notes (Signed)
Upper endoscopy demonstrates an acutely angled distal esophagus and a large (7 cm) highly hernia. I remain suspicious for a gastric volvulus.  Recommendations #1 upper GI series

## 2011-12-27 ENCOUNTER — Encounter (HOSPITAL_COMMUNITY): Payer: Self-pay | Admitting: Gastroenterology

## 2011-12-27 ENCOUNTER — Encounter (HOSPITAL_COMMUNITY): Payer: Self-pay

## 2011-12-27 ENCOUNTER — Inpatient Hospital Stay (HOSPITAL_COMMUNITY): Payer: Medicare Other

## 2011-12-27 ENCOUNTER — Telehealth (INDEPENDENT_AMBULATORY_CARE_PROVIDER_SITE_OTHER): Payer: Self-pay | Admitting: General Surgery

## 2011-12-27 DIAGNOSIS — K449 Diaphragmatic hernia without obstruction or gangrene: Secondary | ICD-10-CM

## 2011-12-27 DIAGNOSIS — R109 Unspecified abdominal pain: Secondary | ICD-10-CM

## 2011-12-27 DIAGNOSIS — I10 Essential (primary) hypertension: Secondary | ICD-10-CM

## 2011-12-27 LAB — FOLATE: Folate: >24 ng/mL (ref >5.4)

## 2011-12-27 LAB — URINE CULTURE

## 2011-12-27 MED ORDER — PANTOPRAZOLE SODIUM 40 MG PO TBEC
40.0000 mg | DELAYED_RELEASE_TABLET | Freq: Every day | ORAL | Status: DC
  Administered 2011-12-27: 40 mg via ORAL
  Filled 2011-12-27: qty 1

## 2011-12-27 MED ORDER — CIPROFLOXACIN HCL 500 MG PO TABS: 500.0000 mg | ORAL_TABLET | Freq: Two times a day (BID) | ORAL | Status: DC

## 2011-12-27 MED ORDER — PANTOPRAZOLE SODIUM 40 MG PO TBEC: 40.0000 mg | DELAYED_RELEASE_TABLET | Freq: Every day | ORAL | Status: DC

## 2011-12-27 MED ORDER — CIPROFLOXACIN HCL 500 MG PO TABS
500.0000 mg | ORAL_TABLET | Freq: Two times a day (BID) | ORAL | Status: DC
  Administered 2011-12-27: 500 mg via ORAL
  Filled 2011-12-27 (×3): qty 1

## 2011-12-27 NOTE — Consult Note (Signed)
Patient will require manometry.  The patient will require medical clearance.  We will schedule to see her in the office later this month.

## 2011-12-27 NOTE — Telephone Encounter (Signed)
Message copied by Liliana Cline on Fri Dec 27, 2011  2:52 PM ------      Message from: Senaida Ores      Created: Fri Dec 27, 2011  2:43 PM      Regarding: New Patient       Nicole Meyer, please schedule [name] for pre-operative esophageal manometry and a consult appt with Dr. Michaell Cowing per his request.  She has a large paraesophageal hernia.  Please schedule in the next few weeks.  Call to arrange manometry and consult appt (OP).            Please let me and Dr. Michaell Cowing know when appts are made.            Thanks!      Aundra Millet

## 2011-12-27 NOTE — Consult Note (Signed)
  We were asked to see the patient for a large hiatal hernia.  I have spoken with patient about this.  She will need to come back and see Dr. Michaell Cowing in the office for an elective repair.  She will need manometry prior to surgery.  Our office will call her with all of this information and appointment times.  Charl Wellen E 12/27/2011

## 2011-12-27 NOTE — Progress Notes (Signed)
[facility] Progress Note  Subjective:  Feels better.  Tolerated regular diet last night.  Objective:  Vital signs in last 24 hours: Temp:  [97.8 F (36.6 C)-98.8 F (37.1 C)] 98.4 F (36.9 C) (11/08 0500) Pulse Rate:  [74-88] 75  (11/08 1052) Resp:  [18-24] 18  (11/08 0500) BP: (70-145)/(38-80) 112/72 mmHg (11/08 1052) SpO2:  [92 %-99 %] 97 % (11/08 0500) Weight:  [192 lb 14.4 oz (87.499 kg)] 192 lb 14.4 oz (87.499 kg) (11/08 0001) Last BM Date: 12/24/11 General:   Alert, Well-developed, in NAD Heart:  Regular rate and rhythm; no murmurs Pulm:  CTAB.  No W/R/R. Abdomen:  Soft, nontender and nondistended. Normal bowel sounds, without guarding, and without rebound.   Extremities:  Without edema. Neurologic:  Alert and  oriented x4;  grossly normal neurologically. Psych:  Alert and cooperative. Normal mood and affect.  Intake/Output from previous day: 11/07 0701 - 11/08 0700 In: 120 [P.O.:120] Out: -   Lab Results:  [facility] 12/25/11 0605 12/24/11 1548  WBC 4.2 6.6  HGB 8.3* 9.8*  HCT 24.3* 29.0*  PLT 175 223   BMET  Basename 12/25/11 0605 12/24/11 1548  NA 135 136  K 4.8 4.5  CL 105 104  CO2 23 21  GLUCOSE 107* 99  BUN 21 26*  CREATININE 1.07 1.11*  CALCIUM 9.3 10.1   LFT  Basename 12/24/11 1743  PROT 7.6  ALBUMIN 4.0  AST 23  ALT 24  ALKPHOS 64  BILITOT 1.1  BILIDIR 0.2  IBILI 0.9   Assessment / Plan: -Large, 7 cm, hiatal hernia. -Epigastric pain.  Now resolved, but ? Secondary to hernia and possible volvulus.  *Follow-up UGI results.  May need surgical consult.   LOS: 3 days   ZEHR, JESSICA D.  12/27/2011, 11:04 AM  Pager number [phone]    UGI demonstrates a large sliding hiatal hernia.  Although there is no obvious volvulus, acute angulation seen at EGD makes me suspicious that she had temporary inceration of her stomach causing her pain.  Would have surgery evaluate for consideration of hiatal hernia repair.

## 2011-12-27 NOTE — Progress Notes (Signed)
DC instructions given to pt at this time RE:  F/u appts, restrictions, diet, home meds, new meds, hiatal hernia, s/s of problems to report to physician, and community resources.  Pt verbalized understanding of all instructions.  No s/s of any acute distress at this time.

## 2011-12-27 NOTE — Discharge Summary (Signed)
Physician Discharge Summary  Patient ID: Nicole Meyer MRN: [id number] DOB/AGE: [birth year] 71 y.o.  Admit date: 12/24/2011 Discharge date: 12/27/2011  Primary Care Physician:  Thora Lance, MD  Discharge Diagnoses:   . Syncope and collapse likely sec to acute pain  . Anemia . HTN (hypertension) . Chronic kidney disease (CKD), stage III (moderate) . IBS (irritable bowel syndrome) . Chest pain, unspecified . large Hiatal hernia with possible intermittent gastric volvulus . Ecoli UTI (lower urinary tract infection)  Consults:  Gastroenterology: Dr Arlyce Dice                    General surgery  Discharge Medications:   Medication List     As of 12/27/2011  5:03 PM    STOP taking these medications         lisinopril-hydrochlorothiazide 20-12.5 MG per tablet   Commonly known as: PRINZIDE,ZESTORETIC      TAKE these medications         albuterol (2.5 MG/3ML) 0.083% nebulizer solution   Commonly known as: PROVENTIL   Take 2.5 mg by nebulization every 6 (six) hours as needed. Wheezing and shortness of breath      aspirin 81 MG tablet   Take 81 mg by mouth daily.      Calcium + D3 600-200 MG-UNIT Tabs   Take 1 tablet by mouth daily.      ciprofloxacin 500 MG tablet   Commonly known as: CIPRO   Take 1 tablet (500 mg total) by mouth 2 (two) times daily. X 3 days      multivitamin tablet   Take 1 tablet by mouth daily.      omeprazole 20 MG capsule   Commonly known as: PRILOSEC   Take 20 mg by mouth daily.          Brief H and P: For complete details please refer to admission H and P, but in brief This is a 71 year old female who was a passenger in the car when she developed an epigastric pain. The pain was sudden onset, ranked 9/10 and sharp. She loss consciousness for approximate to 3-4 minutes. When she awoke, she had soreness in her stomach and some nausea. Her husband took her to the closest urgent care clinic, she was sent to the ER. The patient denied any chest  pain, diaphoresis, she was not post ictal,  no palpitations, no headache, no localized weakness, no seizure activity, no slurred speech. The patient does have a history of irritable syndrome and has chronic intermittent abdominal discomfort. This however was worse than usual. Her last EGD was greater than 5 years ago. She does have heartburn for which she takes Prilosec, she takes a baby aspirin daily. She doesn't eat spicy foods or take any other NSAIDs. The patient has a recent diagnosis of anemia that is being worked up as an outpatient. She had a bone marrow biopsy but no recent colonoscopy or EGD.  In the ER the patient was guaiac negative. Patient was admitted for further workup.    Hospital Course:   Syncope with epigastric pain: Possibly a vasovagal effect with acute abdominal pain. Patient had no syncopal episodes in the hospitalization. Patient was ruled out for ACS with negative troponins. 2-D echo was done which showed EF of 55-60%, normal wall motion, no regional wall motion abnormalities. Moderate atherosclerosis of the aortic root but no aortic dissection seen on the echocardiogram. CT of the abdomen and pelvis was done due to complaint of acute abdominal  pain and showed large hiatal hernia with possible esophagitis.  Acute abdominal pain with Large Hiatal hernia: GI was consulted and patient underwent EGD which showed acutely angled distal esophagus and a large (7 cm) highly hernia. There was no stricture or any malignancy. Upper GI series was done which showed moderate to large sliding-type hiatal hernia but no evidence of gastric volvulus. Per GI, acute angulation seen and EGD shows possibly she had temporary inceration of her stomach causing the pain. General surgery was consulted and recommended outpatient followup with Dr. gross for an elective repair. Patient was set up for surgery followup with Dr Michaell Cowing on 01/08/12 and manometry to the surgery. Patient is currently tolerating regular  diet without any difficulty. She was given instruction to sit upright during eating and continue PPI.  Anemia: Continue PPI   HTN (hypertension): BP remains stable however for somewhat low hence lisinopril and hydrochlorothiazide were temporally placed on hold until she follows up with her PCP.  Chronic kidney disease (CKD), stage III (moderate): Creatinine function remained stable    COPD (chronic obstructive pulmonary disease): Remained stable stable   UTI: Urine culture was positive for Ecoli, patient was DC'd on ciprofloxacin per sensitivities.  Day of Discharge BP 112/72  Pulse 75  Temp 98.4 F (36.9 C) (Oral)  Resp 18  Ht 5\' 3"  (1.6 m)  Wt 87.499 kg (192 lb 14.4 oz)  BMI 34.17 kg/m2  SpO2 97%  Physical Exam: General: Alert and awake oriented x3 not in any acute distress. HEENT: anicteric sclera, pupils reactive to light and accommodation CVS: S1-S2 clear no murmur rubs or gallops Chest: clear to auscultation bilaterally, no wheezing rales or rhonchi Abdomen: soft nontender, nondistended, normal bowel sounds, no organomegaly Extremities: no cyanosis, clubbing or edema noted bilaterally Neuro: Cranial nerves II-XII intact, no focal neurological deficits   The results of significant diagnostics from this hospitalization (including imaging, microbiology, ancillary and laboratory) are listed below for reference.    LAB RESULTS: Basic Metabolic Panel:  Lab 12/25/11 1610 12/24/11 1548  NA 135 136  K 4.8 4.5  CL 105 104  CO2 23 21  GLUCOSE 107* 99  BUN 21 26*  CREATININE 1.07 1.11*  CALCIUM 9.3 10.1  MG -- --  PHOS -- --   Liver Function Tests:  Lab 12/24/11 1743  AST 23  ALT 24  ALKPHOS 64  BILITOT 1.1  PROT 7.6  ALBUMIN 4.0    Lab 12/24/11 1743  LIPASE 64*  AMYLASE --  CBC:  Lab 12/25/11 0605 12/24/11 1548  WBC 4.2 6.6  NEUTROABS -- 5.5  HGB 8.3* 9.8*  HCT 24.3* 29.0*  MCV 100.0 --  PLT 175 223   Cardiac Enzymes: No results found for this  basename: CKTOTAL:2,CKMB:2,CKMBINDEX:2,TROPONINI:2 in the last 168 hours BNP: No components found with this basename: POCBNP:2 CBG: No results found for this basename: GLUCAP:2 in the last 168 hours  Significant Diagnostic Studies:  Ct Abdomen Pelvis W Contrast  12/24/2011  *RADIOLOGY REPORT*  Clinical Data: Severe mid epigastric abdominal pain and nausea, after which the patient became syncopal.  CT ABDOMEN AND PELVIS WITH CONTRAST  Technique:  Multidetector CT imaging of the abdomen and pelvis was performed following the standard protocol during bolus administration of intravenous contrast.  Contrast: 80mL OMNIPAQUE IOHEXOL 300 MG/ML. Oral contrast was also administered.  Comparison: None.  Findings: Moderate sized hiatal hernia, with approximately one third of the stomach present within the chest.  Marked thickening of the wall of the visualized distal  esophagus, though the wall thickening is asymmetric and increased along the right lateral wall.  Stomach otherwise normal in appearance.  Mild wall thickening involving the terminal ileum, with fat in the wall. Remaining small bowel normal in appearance.  Scattered diverticula involving the sigmoid colon without evidence of acute diverticulitis; colon otherwise normal in appearance.  Lipoma involving the ileocecal valve.  Appendix not identified, but no pericecal inflammation. No ascites.  Normal appearing liver, spleen, pancreas, adrenal glands, and kidneys.  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  No significant lymphadenopathy.  Gallbladder unremarkable by CT.  No biliary ductal dilation.  Uterus surgically absent.  No adnexal masses or free pelvic fluid. Very small diverticulum arising from the left lateral wall of the bladder at its base.  Bone window images demonstrate degenerative changes involving the lower lumbar spine.  Visualized lung bases clear.  IMPRESSION:  1.  Moderate sized hiatal hernia.  Asymmetric wall thickening  involving the visualized distal esophagus; while this may be due to esophagitis and chronic GE reflux disease, the asymmetry places neoplasm in the differential diagnosis.  Upper endoscopy is suggested in further evaluation. 2.  No acute abnormalities involving the abdomen or pelvis. 3.  Wall thickening involving the terminal ileum, with fat in the wall suggesting chronic inflammation.  No evidence of acute inflammatory changes. 4.  Very small diverticulum arising from the left lateral wall of the urinary bladder at its base. 5.  Scattered sigmoid colon diverticula without evidence of acute diverticulitis.   Original Report Authenticated By: Hulan Saas, [name]      Disposition and Follow-up: Discharge Orders    Future Appointments: Provider: Department: Dept Phone: Center:   01/02/2012 1:45 PM Radene Gunning [facility] MEDICAL ONCOLOGY [phone] None   01/08/2012 11:00 AM Ardeth Sportsman, MD [facility], [place] [phone] None   02/27/2012 1:45 PM Radene Gunning [facility] MEDICAL ONCOLOGY [phone] None   04/23/2012 1:45 PM Radene Gunning [facility] MEDICAL ONCOLOGY [phone] None   07/06/2012 10:15 AM Windell Hummingbird [facility] MEDICAL ONCOLOGY [phone] None   07/06/2012 10:45 AM Myrtis Ser, NP [facility] MEDICAL ONCOLOGY [phone] None     Future Orders Please Complete By Expires   Diet - low sodium heart healthy      Increase activity slowly      Discharge instructions      Comments:   1) Please sit upright when eating.  2) Hold your BP meds until you follow-up with your primary doctor       DISPOSITION: Home  DIET: heart healthy  ACTIVITY: as tolerated   DISCHARGE FOLLOW-UP Follow-up Information    Follow up with Ardeth Sportsman., MD. Schedule an appointment as soon as possible for a visit on 01/08/2012. (at 11:00 AM to discuss probable repair of your large hiatal hernia)      Contact information:   [address] [place] [address] [phone]       Follow up with Thora Lance, MD. Schedule an appointment as soon as possible for a visit in 7 days. (for hospital follow-up)    Contact information:   [address] [place] [address] [phone]          Time spent on Discharge: 40 minutes  Signed:   RAI,RIPUDEEP [name] [facility] 12/27/2011, 5:03 PM Pager: [phone]

## 2011-12-27 NOTE — Telephone Encounter (Signed)
LMOM with Dr Marzetta Board nurse, Bonita Quin, to make her aware I need to get patient scheduled for a manometry. Awaiting call back.

## 2011-12-30 ENCOUNTER — Other Ambulatory Visit: Payer: Self-pay | Admitting: Gastroenterology

## 2011-12-30 ENCOUNTER — Telehealth: Payer: Self-pay

## 2011-12-30 ENCOUNTER — Telehealth (INDEPENDENT_AMBULATORY_CARE_PROVIDER_SITE_OTHER): Payer: Self-pay

## 2011-12-30 DIAGNOSIS — K449 Diaphragmatic hernia without obstruction or gangrene: Secondary | ICD-10-CM

## 2011-12-30 NOTE — Telephone Encounter (Signed)
Dr Michaell Cowing- [place].

## 2011-12-30 NOTE — Telephone Encounter (Signed)
Pt is scheduled for manometry on 01/06/12 at 11:30am at [facility].  Linda at Dr. Marzetta Board office will notify the patient.

## 2011-12-30 NOTE — Telephone Encounter (Signed)
Pt scheduled for esophageal manometry at [facility] for 01/06/12 at 11:30am, pt to be there at 10:30am. Pt to be NPO after midnight. Left message for pt to call me back for appt date and time. Called [facility] and left message for [facility] regarding appt date and time.

## 2011-12-31 NOTE — Telephone Encounter (Signed)
Pt aware of appt date and time

## 2012-01-02 ENCOUNTER — Other Ambulatory Visit: Payer: BC Managed Care – PPO | Admitting: Lab

## 2012-01-06 ENCOUNTER — Ambulatory Visit (HOSPITAL_COMMUNITY)
Admission: RE | Admit: 2012-01-06 | Discharge: 2012-01-06 | Disposition: A | Discharge status: Home / Self Care | Payer: Medicare Other | Source: Ambulatory Visit | Attending: Gastroenterology | Admitting: Gastroenterology

## 2012-01-06 ENCOUNTER — Telehealth: Payer: Self-pay

## 2012-01-06 ENCOUNTER — Encounter (HOSPITAL_COMMUNITY): Payer: Self-pay | Admitting: *Deleted

## 2012-01-06 ENCOUNTER — Encounter (HOSPITAL_COMMUNITY)
Admission: RE | Disposition: A | Discharge status: Home / Self Care | Payer: Self-pay | Source: Ambulatory Visit | Attending: Gastroenterology

## 2012-01-06 DIAGNOSIS — K449 Diaphragmatic hernia without obstruction or gangrene: Secondary | ICD-10-CM | POA: Insufficient documentation

## 2012-01-06 DIAGNOSIS — Z538 Procedure and treatment not carried out for other reasons: Secondary | ICD-10-CM | POA: Insufficient documentation

## 2012-01-06 SURGERY — CANCELLED PROCEDURE

## 2012-01-06 MED ORDER — LIDOCAINE VISCOUS 2 % MT SOLN
OROMUCOSAL | Status: AC
  Filled 2012-01-06: qty 15

## 2012-01-06 MED ORDER — SODIUM CHLORIDE 0.9 % IV SOLN: INTRAVENOUS | Status: DC

## 2012-01-06 NOTE — OR Nursing (Signed)
Esophageal Manometry intubation attempted.  Met resistance in esophagus.  Pt c/o pain and unable to tolerate attempts at intubation.  Probe withdrawn and procedure cancelled.  Will notify Dr. Arlyce Dice.  Angelique Blonder, RN

## 2012-01-06 NOTE — Telephone Encounter (Signed)
Pt scheduled for esophageal manometry. [facility] called and states that they were unable to complete the procedure, pt was crying and they met a lot of resistance. State they could not get past the hernia. Dr. Michaell Cowing' office notified with [facility]. Spoke with [facility].

## 2012-01-08 ENCOUNTER — Ambulatory Visit (INDEPENDENT_AMBULATORY_CARE_PROVIDER_SITE_OTHER): Payer: Medicare Other | Admitting: Surgery

## 2012-01-08 ENCOUNTER — Encounter (INDEPENDENT_AMBULATORY_CARE_PROVIDER_SITE_OTHER): Payer: Self-pay

## 2012-01-08 ENCOUNTER — Encounter (INDEPENDENT_AMBULATORY_CARE_PROVIDER_SITE_OTHER): Payer: Self-pay | Admitting: Surgery

## 2012-01-08 VITALS — BP 158/70 | HR 94 | Temp 97.5°F | Ht 63.0 in | Wt 193.8 lb

## 2012-01-08 DIAGNOSIS — E669 Obesity, unspecified: Secondary | ICD-10-CM | POA: Insufficient documentation

## 2012-01-08 DIAGNOSIS — K440 Diaphragmatic hernia with obstruction, without gangrene: Secondary | ICD-10-CM

## 2012-01-08 DIAGNOSIS — R1314 Dysphagia, pharyngoesophageal phase: Secondary | ICD-10-CM

## 2012-01-08 DIAGNOSIS — R079 Chest pain, unspecified: Secondary | ICD-10-CM

## 2012-01-08 DIAGNOSIS — D649 Anemia, unspecified: Secondary | ICD-10-CM

## 2012-01-08 DIAGNOSIS — R1310 Dysphagia, unspecified: Secondary | ICD-10-CM | POA: Insufficient documentation

## 2012-01-08 NOTE — Progress Notes (Addendum)
Subjective:     Patient ID: Nicole Meyer, female   DOB: [birth year], 71 y.o.   MRN: [id number]  HPI  Nicole Meyer  [birth year] [id number]  Patient Care Team: Thora Lance, MD as PCP - General (Family Medicine) Barrie Folk, MD (Gastroenterology) Exie Parody, MD (Hematology and Oncology) Louis Meckel, MD as Consulting Physician (Gastroenterology)  This patient is a 71 y.o.female who presents today for surgical evaluation at the request of Dr. Arlyce Dice.   Reason for evaluation: Large paraesophageal hiatal hernia.  Worsening symptoms.  Consideration of surgical repair.  Pleasant obese female with chronic reflux.  Has been on proton pump inhibitors for several years.  Get significant breakthrough reflux and has to use TUMS "all the time".  Has had worsening dysphagia where food will stick.  Choking episodes.  A few times a week she will spit out.  A couple episodes of sore severe chest pain as well.  She had one severe episode that became so bad she passed out.  She was admitted.  Cardiac etiology ruled out.  Pulmonary as well.  Was found to have a large paraesophageal hiatal hernia.  Endoscopy and upper GI confirmed this.  Stabilized.  Given no other etiology, concern was that the paraesophageal hiatal hernia was source of her symptoms and problems.  Therefore we were consulted.  On the day of consult, she had markedly improved and was going home.  Was set up for outpatient followup.  Manometry was set up, however, patient had discomfort and could not tolerate the procedure well.  She claims it was able to be partially done.  Official report saying it could not be started.  Patient has a history of anemia.  Appears to be iron deficient.  Followed by Dr. Gaylyn Rong with Hematology.  There is a report of a colonoscopy by [facility] gastroenterology, Dr. Dorena Cookey.  Mentioned as negative, but I cannot find the report  Patient can only walk about 10 minutes before she has to stop secondary to shortness of  breath and discomfort.  Normally has a bowel movement every day.  Had a hysterectomy when she was 71 years old.  40-pack-year history of smoking with COPD. - quit 10 years ago.   Not oxygen dependent.  Had EKG and echocardiogram in the hospital did not show any major abnormalities.  Patient walks 5-10 minutes before DOE.  No exertional chest/neck/shoulder/arm pain. No personal nor family history of GI/colon cancer, inflammatory bowel disease, irritable bowel syndrome, allergy such as Celiac Sprue, dietary/dairy problems, colitis, ulcers nor gastritis.  No recent sick contacts/gastroenteritis.  No travel outside the country.  No changes in diet.    Patient Active Problem List  Diagnosis  . Anemia  . Leukocytopenia  . HTN (hypertension)  . Asthma  . IBS (irritable bowel syndrome)  . Arthritis  . Anxiety  . Chronic kidney disease (CKD), stage III (moderate)  . Hypercalcemia  . Fatigue  . Lymphopenia  . Syncope and collapse  . Acute epigastric pain  . COPD (chronic obstructive pulmonary disease)  . Chest pain, unspecified  . Incarcerated paraesophageal hernia  . UTI (lower urinary tract infection)  . Esophageal dysphagia from hiatal hernia with occasional vomiting    Past Medical History  Diagnosis Date  . Leukocytopenia   . HTN (hypertension)   . Asthma   . IBS (irritable bowel syndrome)   . Anxiety   . Depression   . Chronic kidney disease (CKD), stage III (moderate)   .  Hypercalcemia   . Fatigue   . Lymphopenia 08/23/2011  . COPD (chronic obstructive pulmonary disease)   . Renal insufficiency   . UTI (urinary tract infection) 12/26/2011  . Syncope and collapse 12/24/2011    "loss of consciousness for 3-4 min" (12/26/2011)  . Hypercholesteremia     "above borderline; I don't take RX for it" (12/26/2011)  . Iron deficiency anemia   . H/O hiatal hernia   . GERD (gastroesophageal reflux disease)   . Arthritis     "knees and back" (12/26/2011)  . Osteoporosis     Past  Surgical History  Procedure Date  . Hernia repair 1970's    "stomach" (12/26/2011)  . Abdominal hysterectomy ~ 1980  . Esophagogastroduodenoscopy 12/26/2011    Procedure: ESOPHAGOGASTRODUODENOSCOPY (EGD);  Surgeon: Louis Meckel, MD;  Location: [facility] ENDOSCOPY;  Service: Endoscopy;  Laterality: N/A;    History   Social History  . Marital Status: Married    Spouse Name: N/A    Number of Children: 2  . Years of Education: N/A   Occupational History  .      retired Armed forces training and education officer   Social History Main Topics  . Smoking status: Former Smoker -- 1.0 packs/day for 40 years    Types: Cigarettes    Quit date: 02/19/1999  . Smokeless tobacco: Never Used  . Alcohol Use: No  . Drug Use: No  . Sexually Active: No   Other Topics Concern  . Not on file   Social History Narrative  . No narrative on file    Family History  Problem Relation Age of Onset  . Diabetes Father   . Heart disease Father   . Cancer Maternal Aunt     Breast cancer    Current Outpatient Prescriptions  Medication Sig Dispense Refill  . albuterol (PROVENTIL) (2.5 MG/3ML) 0.083% nebulizer solution Take 2.5 mg by nebulization every 6 (six) hours as needed. Wheezing and shortness of breath      . aspirin 81 MG tablet Take 81 mg by mouth daily.      . Calcium Carb-Cholecalciferol (CALCIUM + D3) 600-200 MG-UNIT TABS Take 1 tablet by mouth daily.      [name] lisinopril-hydrochlorothiazide (PRINZIDE,ZESTORETIC) 20-12.5 MG per tablet       . Multiple Vitamin (MULTIVITAMIN) tablet Take 1 tablet by mouth daily.      [name] omeprazole (PRILOSEC) 20 MG capsule Take 20 mg by mouth daily.      [name] oxyCODONE-acetaminophen (PERCOCET/ROXICET) 5-325 MG per tablet        No current facility-administered medications for this visit.   Facility-Administered Medications Ordered in Other Visits  Medication Dose Route Frequency Provider Last Rate Last Dose  . [DISCONTINUED] 0.9 %  sodium chloride infusion   Intravenous Continuous  Louis Meckel, MD         Allergies  Allergen Reactions  . Penicillins Hives  . Brovana (Arformoterol) Other (See Comments)    "makes me nervous"  . Paxil (Paroxetine Hcl) Rash    BP 158/70  Pulse 94  Temp 97.5 F (36.4 C) (Temporal)  Ht 5\' 3"  (1.6 m)  Wt 193 lb 12.8 oz (87.907 kg)  BMI 34.33 kg/m2  SpO2 96%  Dg Chest 2 View  12/24/2011  *RADIOLOGY REPORT*  Clinical Data: Loss of consciousness  CHEST - 2 VIEW  Comparison: 04/04/2008  Findings:  Unchanged cardiac silhouette and mediastinal contours.  Unchanged retrocardiac air and fluid-filled opacity compatible with a hiatal hernia.  There is mild diffuse  thickening of the pulmonary interstitium.  No focal airspace opacities.  No definite pleural effusion or pneumothorax.  Unchanged bones.  IMPRESSION:  1.  No acute cardiopulmonary disease.  2.  Hiatal hernia.   Original Report Authenticated By: Tacey Ruiz, MD    Ct Abdomen Pelvis W Contrast  12/24/2011  *RADIOLOGY REPORT*  Clinical Data: Severe mid epigastric abdominal pain and nausea, after which the patient became syncopal.  CT ABDOMEN AND PELVIS WITH CONTRAST  Technique:  Multidetector CT imaging of the abdomen and pelvis was performed following the standard protocol during bolus administration of intravenous contrast.  Contrast: 80mL OMNIPAQUE IOHEXOL 300 MG/ML. Oral contrast was also administered.  Comparison: None.  Findings: Moderate sized hiatal hernia, with approximately one third of the stomach present within the chest.  Marked thickening of the wall of the visualized distal esophagus, though the wall thickening is asymmetric and increased along the right lateral wall.  Stomach otherwise normal in appearance.  Mild wall thickening involving the terminal ileum, with fat in the wall. Remaining small bowel normal in appearance.  Scattered diverticula involving the sigmoid colon without evidence of acute diverticulitis; colon otherwise normal in appearance.  Lipoma involving the  ileocecal valve.  Appendix not identified, but no pericecal inflammation. No ascites.  Normal appearing liver, spleen, pancreas, adrenal glands, and kidneys.  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  No significant lymphadenopathy.  Gallbladder unremarkable by CT.  No biliary ductal dilation.  Uterus surgically absent.  No adnexal masses or free pelvic fluid. Very small diverticulum arising from the left lateral wall of the bladder at its base.  Bone window images demonstrate degenerative changes involving the lower lumbar spine.  Visualized lung bases clear.  IMPRESSION:  1.  Moderate sized hiatal hernia.  Asymmetric wall thickening involving the visualized distal esophagus; while this may be due to esophagitis and chronic GE reflux disease, the asymmetry places neoplasm in the differential diagnosis.  Upper endoscopy is suggested in further evaluation. 2.  No acute abnormalities involving the abdomen or pelvis. 3.  Wall thickening involving the terminal ileum, with fat in the wall suggesting chronic inflammation.  No evidence of acute inflammatory changes. 4.  Very small diverticulum arising from the left lateral wall of the urinary bladder at its base. 5.  Scattered sigmoid colon diverticula without evidence of acute diverticulitis.   Original Report Authenticated By: Hulan Saas, [name]    Dg Kayleen Memos W/high Density W/kub  12/27/2011  *RADIOLOGY REPORT*  Clinical Data:  Abdominal pain and vomiting.  Evaluate for possible gastric volvulus.  UPPER GI SERIES WITH KUB  Technique:  Routine upper GI series was performed with high density barium  Fluoroscopy Time: 4.12 minutes  Comparison:  CT of the abdomen and pelvis 12/24/2011.  Findings: Initial KUB demonstrated gas, stool and residual oral contrast material scattered throughout the colon.  No pathologic distension of small bowel was noted.  Esophagram demonstrated a nonspecific esophageal motility disorder with strong tertiary contractions.   Moderate - large hiatal hernia (sliding-type).  No evidence of gastric volvulus. No evidence of obstructing esophageal mass, stricture or significant esophageal ring was noted.  Images of the stomach demonstrated no gastric mass or definite ulcer.  The duodenal bulb was normal in appearance.  IMPRESSION: 1.  Moderate - large sliding type hiatal hernia. 2.  No evidence of gastric volvulus. 3.  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   Original Report Authenticated By: Trudie Reed, [name]      Review of Systems  Constitutional: Negative for fever, chills, diaphoresis, appetite change and fatigue.  HENT: Negative for ear pain, sore throat, trouble swallowing, neck pain and ear discharge.   Eyes: Negative for photophobia, discharge and visual disturbance.  Respiratory: Negative for cough, choking, chest tightness and shortness of breath.   Cardiovascular: Negative for chest pain and palpitations.  Gastrointestinal: Negative for nausea, vomiting, abdominal pain, diarrhea, constipation, anal bleeding and rectal pain.  Genitourinary: Negative for dysuria, frequency and difficulty urinating.  Musculoskeletal: Negative for myalgias and gait problem.  Skin: Negative for color change, pallor and rash.  Neurological: Negative for dizziness, speech difficulty, weakness and numbness.  Hematological: Negative for adenopathy.  Psychiatric/Behavioral: Negative for confusion and agitation. The patient is not nervous/anxious.        Objective:   Physical Exam  Constitutional: She is oriented to person, place, and time. She appears well-developed and well-nourished. No distress.  HENT:  Head: Normocephalic.  Mouth/Throat: Oropharynx is clear and moist. No oropharyngeal exudate.  Eyes: Conjunctivae normal and EOM are normal. Pupils are equal, round, and reactive to light. No scleral icterus.  Neck: Normal range of motion. Neck supple. No tracheal deviation present.    Cardiovascular: Normal rate, regular rhythm and intact distal pulses.   Pulmonary/Chest: Effort normal and breath sounds normal. No respiratory distress. She exhibits no tenderness.  Abdominal: Soft. She exhibits no distension and no mass. There is no tenderness. Hernia confirmed negative in the right inguinal area and confirmed negative in the left inguinal area.  Genitourinary: No vaginal discharge found.  Musculoskeletal: Normal range of motion. She exhibits no tenderness.  Lymphadenopathy:    She has no cervical adenopathy.       Right: No inguinal adenopathy present.       Left: No inguinal adenopathy present.  Neurological: She is alert and oriented to person, place, and time. No cranial nerve deficit. She exhibits normal muscle tone. Coordination normal.  Skin: Skin is warm and dry. No rash noted. She is not diaphoretic. No erythema.  Psychiatric: She has a normal mood and affect. Her behavior is normal. Judgment and thought content normal.       Assessment:     Large paraesophageal hiatal hernia.  Recurrent episodes of dysphasia and reflux.  Episodes of severe chest pain as well.  Anemia as well.      Plan:     She has numerous indications for surgical repair.  I suspect her iron deficiency anemia is related to [facility] ulcerations related to her paraesophageal hiatal hernia in the absence of any other source.  Would like to get colonoscopy report from [facility] in order to be thorough.  She is starting on heartburn/reflux control with medicine and having dwindling tolerance.  I think she would benefit from surgery With a hiatal hernia repair.  Probably try and do a short fundoplication as well given the fact that I could not get accurate preoperative manometry.   I discussed this in detail with she and her husband:  The anatomy & physiology of the foregut and anti-reflux mechanism was discussed.  The pathophysiology of hiatal herniation and GERD was discussed.  Natural history risks  without surgery was discussed.   The patient's symptoms are not adequately controlled by medicines and other non-operative treatments.  I feel the risks of no intervention will lead to serious problems that outweigh the operative risks; therefore, I recommended surgery to reduce the hiatal hernia out of the chest and fundoplication to rebuild the anti-reflux valve and control reflux  better.  Need for a thorough workup to rule out the differential diagnosis and plan treatment was explained.  I explained laparoscopic techniques with possible need for an open approach.  Risks such as bleeding, infection, abscess, leak, need for further treatment, heart attack, death, and other risks were discussed.   I noted a good likelihood this will help address the problem.  Goals of post-operative recovery were discussed as well.  Possibility that this will not correct all symptoms was explained.  Post-operative dysphagia, need for short-term liquid & pureed diet, inability to vomit, possibility of reherniation, possible need for medicines to help control symptoms in addition to surgery were discussed.  We will work to minimize complications.   Educational handouts further explaining the pathology, treatment options, and dysphagia diet was given as well.  Questions were answered.  The patient expresses understanding & wishes to proceed with surgery.  Given her obesity, she is at high risk for recurrence.  The trend is to go away from mesh reinforcement, but we will see.  Hopefully we will not need to do a Collis gastroplasty esophageal lengthening procedure.  But we will see...  I am concerned about the health of the patient and the ability to tolerate the operation.  Fair exercise tolerance Therefore, we will request clearance by cardiology to better assess operative risk & see if a reevaluation, further workup, adjustment to medications, etc is needed.[name]  Heart he had a decent cardiac workup as an inpatient with a normal  echo and EKG.  Hopefully will be able to tolerate surgery safely.

## 2012-01-08 NOTE — Patient Instructions (Signed)
See the Handout(s) we gave you.  Consider surgery.  Please call our office at [phone] if you wish to schedule surgery or if you have further questions / concerns.   We will need you to have clearance from cardiology first.  Also would like input from your primary care physician.  Hiatal Hernia A hiatal hernia occurs when a part of the stomach slides above the diaphragm. The diaphragm is the thin muscle separating the belly (abdomen) from the chest. A hiatal hernia can be something you are born with or develop over time. Hiatal hernias may allow stomach acid to flow back into your esophagus, the tube which carries food from your mouth to your stomach. If this acid causes problems it is called GERD (gastro-esophageal reflux disease).  SYMPTOMS  Common symptoms of GERD are heartburn (burning in your chest). This is worse when lying down or bending over. It may also cause belching and indigestion. Some of the things which make GERD worse are:  Increased weight pushes on stomach making acid rise more easily.  Smoking markedly increases acid production.  Alcohol decreases lower esophageal sphincter pressure (valve between stomach and esophagus), allowing acid from stomach into esophagus.  Late evening meals and going to bed with a full stomach increases pressure.  Anything that causes an increase in acid production.  Lower esophageal sphincter incompetence. DIAGNOSIS  Hiatal hernia is often diagnosed with x-rays of your stomach and small bowel. This is called an UGI (upper gastrointestinal x-ray). Sometimes a gastroscopic procedure is done. This is a procedure where your caregiver uses a flexible instrument to look into the stomach and small bowel. HOME CARE INSTRUCTIONS   Try to achieve and maintain an ideal body weight.  Avoid drinking alcoholic beverages.  Stop smoking.  Put the head of your bed on 4 to 6 inch blocks. This will keep your head and esophagus higher than your  stomach. If you cannot use blocks, sleep with several pillows under your head and shoulders.  Over-the-counter medications will decrease acid production. Your caregiver can also prescribe medications for this. Take as directed.  1/2 to 1 teaspoon of an antacid taken every hour while awake, with meals and at bedtime, will neutralize acid.  Do not take aspirin, ibuprofen (Advil or Motrin), or other nonsteroidal anti-inflammatory drugs.  Do not wear tight clothing around your chest or stomach.  Eat smaller meals and eat more frequently. This keeps your stomach from getting too full. Eat slowly.  Do not lie down for 2 or 3 hours after eating. Do not eat or drink anything 1 to 2 hours before going to bed.  Avoid caffeine beverages (colas, coffee, cocoa, tea), fatty foods, citrus fruits and all other foods and drinks that contain acid and that seem to increase the problems.  Avoid bending over, especially after eating. Also avoid straining during bowel movements or when urinating or lifting things. Anything that increases the pressure in your belly increases the amount of acid that may be pushed up into your esophagus. SEEK IMMEDIATE MEDICAL CARE IF:  There is change in location (pain in arms, neck, jaw, teeth or back) of your pain, or the pain is getting worse.  You also experience nausea, vomiting, sweating (diaphoresis), or shortness of breath.  You develop continual vomiting, vomit blood or coffee ground material, have bright red blood in your stools, or have black tarry stools. Some of these symptoms could signal other problems such as heart disease. MAKE SURE YOU:   Understand these  instructions.  Monitor your condition.  Contact your caregiver if you are not doing well or are getting worse. Document Released: 04/27/2003 Document Revised: 04/29/2011 Document Reviewed: 02/04/2005 [facility] Patient Information 2013 [place], [place].

## 2012-01-09 ENCOUNTER — Encounter (INDEPENDENT_AMBULATORY_CARE_PROVIDER_SITE_OTHER): Payer: Self-pay

## 2012-01-09 ENCOUNTER — Ambulatory Visit (INDEPENDENT_AMBULATORY_CARE_PROVIDER_SITE_OTHER): Payer: Medicare Other | Admitting: Cardiovascular Disease

## 2012-01-09 VITALS — BP 166/91 | HR 98 | Ht 63.0 in | Wt 191.0 lb

## 2012-01-09 DIAGNOSIS — I10 Essential (primary) hypertension: Secondary | ICD-10-CM

## 2012-01-09 DIAGNOSIS — R55 Syncope and collapse: Secondary | ICD-10-CM

## 2012-01-09 DIAGNOSIS — R1013 Epigastric pain: Secondary | ICD-10-CM

## 2012-01-09 DIAGNOSIS — R52 Pain, unspecified: Secondary | ICD-10-CM

## 2012-01-09 NOTE — Assessment & Plan Note (Signed)
Well controlled.  Continue current medications and low sodium Dash type diet.    

## 2012-01-09 NOTE — Progress Notes (Signed)
Patient ID: Nicole Meyer, female   DOB: [birth year], 71 y.o.   MRN: [id number] 71 yo with no cardiac history.  Recent admission for abdominal pain.  No cardiac issues Echo reviewed and normal. Long history of epigastric pain, nausea and vohmiting.  Hiatal hernia getting bigger and needs surgery.  Has not had surgery in last decade.  No previous anesthetic problems or bleeding diathesis This last hospitalization had vagal episode with "syncope" during abdominal pain.  No arrhythmia on telemetry.  CRF HTN on Rx.    ROS: Denies fever, malais, weight loss, blurry vision, decreased visual acuity, cough, sputum, SOB, hemoptysis, pleuritic pain, palpitaitons, heartburn, abdominal pain, melena, lower extremity edema, claudication, or rash.  All other systems reviewed and negative   General: Affect appropriate Healthy:  appears stated age HEENT: normal Neck supple with no adenopathy JVP normal no bruits no thyromegaly Lungs clear with no wheezing and good diaphragmatic motion Heart:  S1/S2 no murmur,rub, gallop or click PMI normal Abdomen: benighn, BS positve, no tenderness, no AAA no bruit.  No HSM or HJR Distal pulses intact with no bruits No edema Neuro non-focal Skin warm and dry No muscular weakness  Medications Current Outpatient Prescriptions  Medication Sig Dispense Refill  . albuterol (PROVENTIL) (2.5 MG/3ML) 0.083% nebulizer solution Take 2.5 mg by nebulization every 6 (six) hours as needed. Wheezing and shortness of breath      . aspirin 81 MG tablet Take 81 mg by mouth daily.      . Calcium Carb-Cholecalciferol (CALCIUM + D3) 600-200 MG-UNIT TABS Take 1 tablet by mouth daily.      [name] lisinopril-hydrochlorothiazide (PRINZIDE,ZESTORETIC) 20-12.5 MG per tablet Take 1 tablet by mouth daily.       . Multiple Vitamin (MULTIVITAMIN) tablet Take 1 tablet by mouth daily.      [name] omeprazole (PRILOSEC) 20 MG capsule Take 20 mg by mouth daily.      [name] oxyCODONE-acetaminophen (PERCOCET/ROXICET)  5-325 MG per tablet         Allergies Penicillins; Brovana; and Paxil  Family History: Family History  Problem Relation Age of Onset  . Diabetes Father   . Heart disease Father   . Cancer Maternal Aunt     Breast cancer    Social History: History   Social History  . Marital Status: Married    Spouse Name: N/A    Number of Children: 2  . Years of Education: N/A   Occupational History  .      retired Armed forces training and education officer   Social History Main Topics  . Smoking status: Former Smoker -- 1.0 packs/day for 40 years    Types: Cigarettes    Quit date: 02/19/1999  . Smokeless tobacco: Never Used  . Alcohol Use: No  . Drug Use: No  . Sexually Active: No   Other Topics Concern  . Not on file   Social History Narrative  . No narrative on file    Electrocardiogram:  11/10 NSR rate 99 read as IMI but normal  Assessment and Plan

## 2012-01-09 NOTE — Assessment & Plan Note (Signed)
No cardiac etiology Related to epigastric pain Likely vagal.  Normal telemetry, ECG and echo

## 2012-01-09 NOTE — Assessment & Plan Note (Signed)
Resolved Clear to have hiatal hernia surgery with Dr Michaell Cowing

## 2012-01-14 ENCOUNTER — Other Ambulatory Visit (INDEPENDENT_AMBULATORY_CARE_PROVIDER_SITE_OTHER): Payer: Self-pay | Admitting: Surgery

## 2012-02-27 ENCOUNTER — Other Ambulatory Visit (HOSPITAL_BASED_OUTPATIENT_CLINIC_OR_DEPARTMENT_OTHER): Payer: Medicare PPO | Admitting: Lab

## 2012-02-27 DIAGNOSIS — D649 Anemia, unspecified: Secondary | ICD-10-CM

## 2012-02-27 LAB — CBC WITH DIFFERENTIAL/PLATELET
EOS%: 4.5 % (ref 0.0–7.0)
Eosinophils Absolute: 0.2 10*3/uL (ref 0.0–0.5)
LYMPH%: 19.4 % (ref 14.0–49.7)
MCH: 33.1 pg (ref 25.1–34.0)
MCV: 100 fL (ref 79.5–101.0)
MONO%: 6.2 % (ref 0.0–14.0)
NEUT#: 3.3 10*3/uL (ref 1.5–6.5)
Platelets: 218 10*3/uL (ref 145–400)
RBC: 2.99 10*6/uL — ABNORMAL LOW (ref 3.70–5.45)
RDW: 13.4 % (ref 11.2–14.5)

## 2012-02-29 ENCOUNTER — Other Ambulatory Visit: Payer: Self-pay | Admitting: Oncology

## 2012-03-02 ENCOUNTER — Telehealth: Payer: Self-pay | Admitting: *Deleted

## 2012-03-02 NOTE — Telephone Encounter (Signed)
lmoam to call me. 

## 2012-03-02 NOTE — Telephone Encounter (Signed)
Message copied by Reesa Chew on Mon Mar 02, 2012  4:05 PM ------      Message from: Su Hilt C      Created: Mon Mar 02, 2012  3:13 PM                   ----- Message -----         From: Exie Parody, MD         Sent: 02/29/2012  12:54 PM           To: Marlowe Aschoff, RN            Please call pt.  Her Hgb is stable.  Continue oral iron as tolerated.  Thanks.

## 2012-03-03 ENCOUNTER — Telehealth: Payer: Self-pay | Admitting: *Deleted

## 2012-03-03 NOTE — Telephone Encounter (Signed)
Message copied by Reesa Chew on Tue Mar 03, 2012  8:46 AM ------      Message from: Su Hilt C      Created: Mon Mar 02, 2012  3:13 PM                   ----- Message -----         From: Exie Parody, MD         Sent: 02/29/2012  12:54 PM           To: Marlowe Aschoff, RN            Please call pt.  Her Hgb is stable.  Continue oral iron as tolerated.  Thanks.

## 2012-03-03 NOTE — Telephone Encounter (Signed)
Informed patient that her hgb was stable and to continue po iron as tolerated.

## 2012-03-30 ENCOUNTER — Encounter (INDEPENDENT_AMBULATORY_CARE_PROVIDER_SITE_OTHER): Payer: Self-pay | Admitting: Surgery

## 2012-03-30 ENCOUNTER — Encounter (HOSPITAL_COMMUNITY): Payer: Self-pay | Admitting: Pharmacy Technician

## 2012-03-30 ENCOUNTER — Ambulatory Visit (INDEPENDENT_AMBULATORY_CARE_PROVIDER_SITE_OTHER): Payer: Medicare PPO | Admitting: Surgery

## 2012-03-30 VITALS — BP 162/76 | HR 84 | Temp 97.4°F | Resp 16 | Ht 63.0 in | Wt 185.2 lb

## 2012-03-30 DIAGNOSIS — K429 Umbilical hernia without obstruction or gangrene: Secondary | ICD-10-CM

## 2012-03-30 NOTE — Patient Instructions (Signed)
Managing Pain  Pain after surgery or related to activity is often due to strain/injury to muscle, tendon, nerves and/or incisions.  This pain is usually short-term and will improve in a few months.   Many people find it helpful to do the following things TOGETHER to help speed the process of healing and to get back to regular activity more quickly:  1. Avoid heavy physical activity a.  no lifting greater than 20 pounds b. Do not "push through" the pain.  Listen to your body and avoid positions and maneuvers than reproduce the pain c. Walking is okay as tolerated, but go slowly and stop when getting sore.  d. Remember: If it hurts to do it, then don't do it! 2. Take Anti-inflammatory medication  a. Take with food/snack around the clock for 1-2 weeks i. This helps the muscle and nerve tissues become less irritable and calm down faster b. Choose ONE of the following over-the-counter medications: i. Naproxen 220mg  tabs (ex. Aleve) 1-2 pills twice a day  ii. Ibuprofen 200mg  tabs (ex. Advil, Motrin) 3-4 pills with every meal and just before bedtime iii. Acetaminophen 500mg  tabs (Tylenol) 1-2 pills with every meal and just before bedtime 3. Use a Heating pad or Ice/Cold Pack a. 4-6 times a day b. May use warm bath/hottub  or showers 4. Try Gentle Massage and/or Stretching  a. at the area of pain many times a day b. stop if you feel pain - do not overdo it  Try these steps together to help you body heal faster and avoid making things get worse.  Doing just one of these things may not be enough.    If you are not getting better after two weeks or are noticing you are getting worse, contact our office for further advice; we may need to re-evaluate you & see what other things we can do to help.  EATING AFTER YOUR ESOPHAGEAL SURGERY (Stomach Fundoplication, Hiatal Hernia repair, Achalasia surgery, etc)  After your esophageal surgery, expect some sticking with swallowing over the next 1-2 months.     If food sticks when you eat, it is called "dysphagia".  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  It will gradually ease off over the next few months.  To help you through this temporary phase, we start you out on a pureed (blenderized) diet.  Your first meal in the hospital was thin liquids.  You should have been given a pureed diet by the time you left the hospital.  We ask patients to stay on a pureed diet for the first 2-3 weeks to avoid anything getting "stuck" near your recent surgery.  Don't be alarmed if your ability to swallow doesn't progress according to this plan.  Everyone is different and some diets can advance more or less quickly.     Some BASIC RULES to follow are:  Maintain an upright position whenever eating or drinking.  Take small bites - just a teaspoon size bite at a time.  Eat slowly.  It may also help to eat only one food at a time.  Consider nibbling through smaller, more frequent meals & avoid the urge to eat BIG meals  Do not push through feelings of fullness, nausea, or bloatedness  Do not mix solid foods and liquids in the same mouthful  Try not to "wash foods down" with large gulps of liquids. Avoid carbonated (bubbly/fizzy) drinks.  Understand that it will be hard to burp  and belch at first.  This gradually improves with time.  Expect to be more gassy/flatulent/bloated initially.  Walking will help you work through that.  Maalox/Gas-X can help as well.  Eat in a relaxed atmosphere & minimize distractions.  Avoid talking while eating.  Following each meal, sit in an upright position (90 degree angle) for 30 to 45 minutes.  Going for a short walk can help as well  If food does stick, don't panic.  Try to relax and let the food pass on its own.  Sipping WARM LIQUID such as strong hot black tea can also help slide it down.   Be gradual in changes & use common sense:  -If you easily tolerating a certain  "level" of foods, advance to the next level gradually -If you are having trouble swallowing a particular food, then avoid it.   -If food is sticking when you advance your diet, go back to thinner previous diet (the lower LEVEL) for 1-2 days.  LEVEL 1 = PUREED DIET  Do for the first 2 WEEKS AFTER SURGERY  -Foods in this group are pureed or blenderized to a smooth, mashed potato-like consistency.  -If necessary, the pureed foods can keep their shape with the addition of a thickening agent.   -Meat should be pureed to a smooth, pasty consistency.  Hot broth or gravy may be added to the pureed meat, approximately 1 oz. of liquid per 3 oz. serving of meat. -CAUTION:  If any foods do not puree into a smooth consistency, swallowing will be more difficult.  (For example, nuts or seeds sometimes do not blend well.)  Hot Foods Cold Foods  Pureed scrambled eggs and cheese Pureed cottage cheese  Baby cereals Thickened juices and nectars  Thinned cooked cereals (no lumps) Thickened milk or eggnog  Pureed [place] toast or pancakes Ensure  Mashed potatoes Ice cream  Pureed parsley, au gratin, scalloped potatoes, candied sweet potatoes Fruit or [place] ice, sherbet  Pureed buttered or alfredo noodles Plain yogurt  Pureed vegetables (no corn or peas) Instant breakfast  Pureed soups and creamed soups Smooth pudding, mousse, custard  Pureed scalloped apples Whipped gelatin  Gravies Sugar, syrup, honey, jelly  Sauces, cheese, tomato, barbecue, white, creamed Cream  Any baby food Creamer  Alcohol in moderation (not beer or champagne) Margarine  Coffee or tea Mayonnaise   Ketchup, mustard   Apple sauce   SAMPLE MENU:  PUREED DIET Breakfast Lunch Dinner   Orange juice, 1/2 cup  Cream of wheat, 1/2 cup  Pineapple juice, 1/2 cup  Pureed [place], barley soup, 3/4 cup  Pureed Hawaiian chicken, 3 oz   Scrambled eggs, mashed or blended with cheese, 1/2 cup  Tea or coffee, 1 cup   Whole milk, 1 cup    Non-dairy creamer, 2 Tbsp.  Mashed potatoes, 1/2 cup  Pureed cooled broccoli, 1/2 cup  Apple sauce, 1/2 cup  Coffee or tea  Mashed potatoes, 1/2 cup  Pureed spinach, 1/2 cup  Frozen yogurt, 1/2 cup  Tea or coffee      LEVEL 2 = SOFT DIET  After your first 2 weeks, you can advance to a soft diet.   Keep on this diet until everything goes down easily.  Hot Foods Cold Foods  White fish Cottage cheese  Stuffed fish Junior baby fruit  Baby food meals Semi thickened juices  Minced soft cooked, scrambled, poached eggs nectars  Souffle & omelets Ripe mashed bananas  Cooked cereals Canned fruit, pineapple sauce, milk  potatoes Milkshake  Buttered or Alfredo noodles Custard  Cooked cooled vegetable Puddings, including tapioca  [place] Yogurt  Vegetable soup or alphabet soup Fruit ice, [place] ice  Gravies Whipped gelatin  Sugar, syrup, honey, jelly Junior baby desserts  Sauces:  Cheese, creamed, barbecue, tomato, white Cream  Coffee or tea Margarine   SAMPLE MENU:  LEVEL 2 Breakfast Lunch Dinner   Orange juice, 1/2 cup  Oatmeal, 1/2 cup  Scrambled eggs with cheese, 1/2 cup  Decaffeinated tea, 1 cup  Whole milk, 1 cup  Non-dairy creamer, 2 Tbsp  Pineapple juice, 1/2 cup  Minced beef, 3 oz  Gravy, 2 Tbsp  Mashed potatoes, 1/2 cup  Minced fresh broccoli, 1/2 cup  Applesauce, 1/2 cup  Coffee, 1 cup  [place], barley soup, 3/4 cup  Minced Hawaiian chicken, 3 oz  Mashed potatoes, 1/2 cup  Cooked spinach, 1/2 cup  Frozen yogurt, 1/2 cup  Non-dairy creamer, 2 Tbsp      LEVEL 3 = CHOPPED DIET  -After all the foods in level 2 (soft diet) are passing through well you should advance up to more chopped foods.  -It is still important to cut these foods into small pieces and eat slowly.  Hot Foods Cold Foods  Poultry Cottage cheese  Chopped Swedish meatballs Yogurt  Meat salads (ground or flaked meat) Milk  Flaked fish (tuna) Milkshakes  Poached  or scrambled eggs Soft, cold, dry cereal  Souffles and omelets Fruit juices or nectars  Cooked cereals Chopped canned fruit  Chopped [place] toast or pancakes Canned fruit cocktail  Noodles or pasta (no rice) Pudding, mousse, custard  Cooked vegetables (no frozen peas, corn, or mixed vegetables) Green salad  Canned small sweet peas Ice cream  Creamed soup or vegetable soup Fruit ice, [place] ice  Pureed vegetable soup or alphabet soup Non-dairy creamer  Ground scalloped apples Margarine  Gravies Mayonnaise  Sauces:  Cheese, creamed, barbecue, tomato, white Ketchup  Coffee or tea Mustard   SAMPLE MENU:  LEVEL 3 Breakfast Lunch Dinner   Orange juice, 1/2 cup  Oatmeal, 1/2 cup  Scrambled eggs with cheese, 1/2 cup  Decaffeinated tea, 1 cup  Whole milk, 1 cup  Non-dairy creamer, 2 Tbsp  Ketchup, 1 Tbsp  Margarine, 1 tsp  Salt, 1/4 tsp  Sugar, 2 tsp  Pineapple juice, 1/2 cup  Ground beef, 3 oz  Gravy, 2 Tbsp  Mashed potatoes, 1/2 cup  Cooked spinach, 1/2 cup  Applesauce, 1/2 cup  Decaffeinated coffee  Whole milk  Non-dairy creamer, 2 Tbsp  Margarine, 1 tsp  Salt, 1/4 tsp  Pureed [place], barley soup, 3/4 cup  Barbecue chicken, 3 oz  Mashed potatoes, 1/2 cup  Ground fresh broccoli, 1/2 cup  Frozen yogurt, 1/2 cup  Decaffeinated tea, 1 cup  Non-dairy creamer, 2 Tbsp  Margarine, 1 tsp  Salt, 1/4 tsp  Sugar, 1 tsp    LEVEL 4:  REGULAR FOODS  -Foods in this group are soft, moist, regularly textured foods.   -This level includes meat and breads, which tend to be the hardest things to swallow.   -Eat very slowly, chew well and continue to avoid carbonated drinks. -most people are at this level in 4-6 weeks  Hot Foods Cold Foods  Baked fish or skinned Soft cheeses - cottage cheese  Souffles and omelets Cream cheese  Eggs Yogurt  Stuffed shells Milk  Spaghetti with meat sauce Milkshakes  Cooked cereal Cold dry cereals (no nuts, dried fruit,  coconut)  [place] toast or  pancakes Crackers  Buttered toast Fruit juices or nectars  Noodles or pasta (no rice) Canned fruit  Potatoes (all types) Ripe bananas  Soft, cooked vegetables (no corn, lima, or baked beans) Peeled, ripe, fresh fruit  Creamed soups or vegetable soup Cakes (no nuts, dried fruit, coconut)  Canned chicken noodle soup Plain doughnuts  Gravies Ice cream  Bacon dressing Pudding, mousse, custard  Sauces:  Cheese, creamed, barbecue, tomato, white Fruit ice, [place] ice, sherbet  Decaffeinated tea or coffee Whipped gelatin  Pork chops Regular gelatin   Canned fruited gelatin molds   Sugar, syrup, honey, jam, jelly   Cream   Non-dairy   Margarine   Oil   Mayonnaise   Ketchup   Mustard    If you have any questions please call our office at [facility]: [phone].

## 2012-03-30 NOTE — Progress Notes (Signed)
Subjective:     Patient ID: Nicole Meyer, female   DOB: [birth year], 72 y.o.   MRN: [id number]  HPI   DAIELLE Meyer  02-Dec-1940 [id number]  Patient Care Team: Thora Lance, MD as PCP - General (Family Medicine) Barrie Folk, MD (Gastroenterology) Exie Parody, MD (Hematology and Oncology) Louis Meckel, MD as Consulting Physician (Gastroenterology)  This patient is a 72 y.o.female who presents today for surgical evaluation at the request of Dr. Arlyce Dice.   Reason for evaluation: Large paraesophageal hiatal hernia.  Worsening symptoms.  Consideration of surgical repair.  Pleasant obese female with chronic reflux.  Has been on proton pump inhibitors for several years.  Get significant breakthrough reflux and has to use TUMS "all the time".  Has had worsening dysphagia where food will stick.  Choking episodes.  A few times a week she will spit out.  A couple episodes of sore severe chest pain as well.  She had one severe episode that became so bad she passed out.  She was admitted.  Cardiac etiology ruled out.  Pulmonary as well.  Was found to have a large paraesophageal hiatal hernia.  Endoscopy and upper GI confirmed this.  Stabilized.  Given no other etiology, concern was that the paraesophageal hiatal hernia was source of her symptoms and problems.  Therefore we were consulted.  On the day of consult, she had markedly improved and was going home.  Was set up for outpatient followup.  Manometry was set up, however, patient had discomfort and could not tolerate the procedure well.  She claims it was able to be partially done.  Official report saying it could not be started.  Patient has a history of anemia.  Appears to be iron deficient.  Followed by Dr. Gaylyn Rong with Hematology.  There is a report of a colonoscopy by [facility] gastroenterology, Dr. Dorena Cookey.  Mentioned as negative, but I cannot find the report  Patient can only walk about 10 minutes before she has to stop secondary to shortness of  breath and discomfort.  Normally has a bowel movement every day.  Had a hysterectomy when she was 72 years old.  40-pack-year history of smoking with COPD. - quit 10 years ago.   Not oxygen dependent.  Had EKG and echocardiogram in the hospital did not show any major abnormalities.  Patient walks 5-10 minutes before DOE.  No exertional chest/neck/shoulder/arm pain. No personal nor family history of GI/colon cancer, inflammatory bowel disease, irritable bowel syndrome, allergy such as Celiac Sprue, dietary/dairy problems, colitis, ulcers nor gastritis.  No recent sick contacts/gastroenteritis.  No travel outside the country.  No changes in diet.  Patient notes a paramedian periumbilical pain for the past month.  Worse with coughing and activity.  Tried a Lidoderm pain patch without much help.  Occasionally uses oxycodone.  Seems annoyed/frustrated by it.  Reflux symptoms have not changed much since I saw her in November.  Patient Active Problem List  Diagnosis  . Anemia  . Leukocytopenia  . HTN (hypertension)  . Asthma  . IBS (irritable bowel syndrome)  . Arthritis  . Anxiety  . Chronic kidney disease (CKD), stage III (moderate)  . Hypercalcemia  . Fatigue  . Lymphopenia  . Syncope and collapse  . Acute epigastric pain  . COPD (chronic obstructive pulmonary disease)  . Chest pain, noncardiac - probably due to giant hiatal hernia  . Incarcerated paraesophageal hernia  . UTI (lower urinary tract infection)  . Esophageal dysphagia from  hiatal hernia with occasional vomiting  . Obesity (BMI 30-39.9)  . Umbilical hernia    Past Medical History  Diagnosis Date  . Leukocytopenia   . HTN (hypertension)   . Asthma   . IBS (irritable bowel syndrome)   . Anxiety   . Depression   . Chronic kidney disease (CKD), stage III (moderate)   . Hypercalcemia   . Fatigue   . Lymphopenia 08/23/2011  . COPD (chronic obstructive pulmonary disease)   . Renal insufficiency   . UTI (urinary tract  infection) 12/26/2011  . Syncope and collapse 12/24/2011    "loss of consciousness for 3-4 min" (12/26/2011)  . Hypercholesteremia     "above borderline; I don't take RX for it" (12/26/2011)  . Iron deficiency anemia   . H/O hiatal hernia   . GERD (gastroesophageal reflux disease)   . Arthritis     "knees and back" (12/26/2011)  . Osteoporosis     Past Surgical History  Procedure Laterality Date  . Hernia repair  1970's    "stomach" (12/26/2011)  . Abdominal hysterectomy  ~ 1980  . Esophagogastroduodenoscopy  12/26/2011    Procedure: ESOPHAGOGASTRODUODENOSCOPY (EGD);  Surgeon: Louis Meckel, MD;  Location: [facility] ENDOSCOPY;  Service: Endoscopy;  Laterality: N/A;    History   Social History  . Marital Status: Married    Spouse Name: N/A    Number of Children: 2  . Years of Education: N/A   Occupational History  .      retired Armed forces training and education officer   Social History Main Topics  . Smoking status: Former Smoker -- 1.00 packs/day for 40 years    Types: Cigarettes    Quit date: 02/19/1999  . Smokeless tobacco: Never Used  . Alcohol Use: No  . Drug Use: No  . Sexually Active: No   Other Topics Concern  . Not on file   Social History Narrative  . No narrative on file    Family History  Problem Relation Age of Onset  . Diabetes Father   . Heart disease Father   . Cancer Maternal Aunt     Breast cancer    Current Outpatient Prescriptions  Medication Sig Dispense Refill  . albuterol (PROVENTIL) (2.5 MG/3ML) 0.083% nebulizer solution Take 2.5 mg by nebulization daily. Wheezing and shortness of breath      . aspirin 81 MG tablet Take 81 mg by mouth daily.      . beta carotene w/minerals (OCUVITE) tablet Take 1 tablet by mouth daily.      . Calcium Carb-Cholecalciferol (CALCIUM + D3) 600-200 MG-UNIT TABS Take 1 tablet by mouth daily.      [name] lisinopril-hydrochlorothiazide (PRINZIDE,ZESTORETIC) 20-12.5 MG per tablet Take 1 tablet by mouth every morning.       . Multiple  Vitamin (MULTIVITAMIN) tablet Take 1 tablet by mouth daily.      [name] omeprazole (PRILOSEC) 20 MG capsule Take 20 mg by mouth daily.      [name] oxyCODONE-acetaminophen (PERCOCET/ROXICET) 5-325 MG per tablet Take 1 tablet by mouth every 6 (six) hours as needed for pain.        No current facility-administered medications for this visit.     Allergies  Allergen Reactions  . Penicillins Hives  . Brovana (Arformoterol) Other (See Comments)    "makes me nervous"  . Paxil (Paroxetine Hcl) Rash    BP 162/76  Pulse 84  Temp(Src) 97.4 F (36.3 C) (Temporal)  Resp 16  Ht 5\' 3"  (1.6 m)  Wt  185 lb 3.2 oz (84.006 kg)  BMI 32.81 kg/m2  Dg Chest 2 View  12/24/2011  *RADIOLOGY REPORT*  Clinical Data: Loss of consciousness  CHEST - 2 VIEW  Comparison: 04/04/2008  Findings:  Unchanged cardiac silhouette and mediastinal contours.  Unchanged retrocardiac air and fluid-filled opacity compatible with a hiatal hernia.  There is mild diffuse thickening of the pulmonary interstitium.  No focal airspace opacities.  No definite pleural effusion or pneumothorax.  Unchanged bones.  IMPRESSION:  1.  No acute cardiopulmonary disease.  2.  Hiatal hernia.   Original Report Authenticated By: Tacey Ruiz, MD    Ct Abdomen Pelvis W Contrast  12/24/2011  *RADIOLOGY REPORT*  Clinical Data: Severe mid epigastric abdominal pain and nausea, after which the patient became syncopal.  CT ABDOMEN AND PELVIS WITH CONTRAST  Technique:  Multidetector CT imaging of the abdomen and pelvis was performed following the standard protocol during bolus administration of intravenous contrast.  Contrast: 80mL OMNIPAQUE IOHEXOL 300 MG/ML. Oral contrast was also administered.  Comparison: None.  Findings: Moderate sized hiatal hernia, with approximately one third of the stomach present within the chest.  Marked thickening of the wall of the visualized distal esophagus, though the wall thickening is asymmetric and increased along the right lateral wall.   Stomach otherwise normal in appearance.  Mild wall thickening involving the terminal ileum, with fat in the wall. Remaining small bowel normal in appearance.  Scattered diverticula involving the sigmoid colon without evidence of acute diverticulitis; colon otherwise normal in appearance.  Lipoma involving the ileocecal valve.  Appendix not identified, but no pericecal inflammation. No ascites.  Normal appearing liver, spleen, pancreas, adrenal glands, and kidneys.  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  No significant lymphadenopathy.  Gallbladder unremarkable by CT.  No biliary ductal dilation.  Uterus surgically absent.  No adnexal masses or free pelvic fluid. Very small diverticulum arising from the left lateral wall of the bladder at its base.  Bone window images demonstrate degenerative changes involving the lower lumbar spine.  Visualized lung bases clear.  IMPRESSION:  1.  Moderate sized hiatal hernia.  Asymmetric wall thickening involving the visualized distal esophagus; while this may be due to esophagitis and chronic GE reflux disease, the asymmetry places neoplasm in the differential diagnosis.  Upper endoscopy is suggested in further evaluation. 2.  No acute abnormalities involving the abdomen or pelvis. 3.  Wall thickening involving the terminal ileum, with fat in the wall suggesting chronic inflammation.  No evidence of acute inflammatory changes. 4.  Very small diverticulum arising from the left lateral wall of the urinary bladder at its base. 5.  Scattered sigmoid colon diverticula without evidence of acute diverticulitis.   Original Report Authenticated By: Hulan Saas, [name]    Dg Kayleen Memos W/high Density W/kub  12/27/2011  *RADIOLOGY REPORT*  Clinical Data:  Abdominal pain and vomiting.  Evaluate for possible gastric volvulus.  UPPER GI SERIES WITH KUB  Technique:  Routine upper GI series was performed with high density barium  Fluoroscopy Time: 4.12 minutes  Comparison:  CT  of the abdomen and pelvis 12/24/2011.  Findings: Initial KUB demonstrated gas, stool and residual oral contrast material scattered throughout the colon.  No pathologic distension of small bowel was noted.  Esophagram demonstrated a nonspecific esophageal motility disorder with strong tertiary contractions.  Moderate - large hiatal hernia (sliding-type).  No evidence of gastric volvulus. No evidence of obstructing esophageal mass, stricture or significant esophageal ring was noted.  Images of the stomach demonstrated  no gastric mass or definite ulcer.  The duodenal bulb was normal in appearance.  IMPRESSION: 1.  Moderate - large sliding type hiatal hernia. 2.  No evidence of gastric volvulus. 3.  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   Original Report Authenticated By: Trudie Reed, [name]      Review of Systems  Constitutional: Negative for fever, chills, diaphoresis, appetite change and fatigue.  HENT: Negative for ear pain, sore throat, trouble swallowing, neck pain and ear discharge.   Eyes: Negative for photophobia, discharge and visual disturbance.  Respiratory: Negative for cough, choking, chest tightness and shortness of breath.   Cardiovascular: Negative for chest pain and palpitations.  Gastrointestinal: Negative for nausea, vomiting, abdominal pain, diarrhea, constipation, anal bleeding and rectal pain.  Genitourinary: Negative for dysuria, frequency and difficulty urinating.  Musculoskeletal: Negative for myalgias and gait problem.  Skin: Negative for color change, pallor and rash.  Neurological: Negative for dizziness, speech difficulty, weakness and numbness.  Hematological: Negative for adenopathy.  Psychiatric/Behavioral: Negative for confusion and agitation. The patient is not nervous/anxious.        Objective:   Physical Exam  Constitutional: She is oriented to person, place, and time. She appears well-developed and well-nourished. No  distress.  HENT:  Head: Normocephalic.  Mouth/Throat: Oropharynx is clear and moist. No oropharyngeal exudate.  Eyes: Conjunctivae and EOM are normal. Pupils are equal, round, and reactive to light. No scleral icterus.  Neck: Normal range of motion. Neck supple. No tracheal deviation present.  Cardiovascular: Normal rate, regular rhythm and intact distal pulses.   Pulmonary/Chest: Effort normal and breath sounds normal. No respiratory distress. She exhibits no tenderness.  Abdominal: Soft. She exhibits no distension and no mass. There is tenderness in the periumbilical area. There is no rigidity and no guarding. A hernia is present. Hernia confirmed positive in the ventral area. Hernia confirmed negative in the right inguinal area and confirmed negative in the left inguinal area.    Genitourinary: No vaginal discharge found.  Musculoskeletal: Normal range of motion. She exhibits no tenderness.  Lymphadenopathy:    She has no cervical adenopathy.       Right: No inguinal adenopathy present.       Left: No inguinal adenopathy present.  Neurological: She is alert and oriented to person, place, and time. No cranial nerve deficit. She exhibits normal muscle tone. Coordination normal.  Skin: Skin is warm and dry. No rash noted. She is not diaphoretic. No erythema.  Psychiatric: She has a normal mood and affect. Her behavior is normal. Judgment and thought content normal.       Assessment:     Large paraesophageal hiatal hernia.  Recurrent episodes of dysphasia and reflux.  Episodes of severe chest pain as well.  Anemia as well.  Painful umbilical hernia.  Small reducible.    Plan:     She has numerous indications for surgical repair.  I suspect her iron deficiency anemia is related to [facility] ulcerations related to her paraesophageal hiatal hernia in the absence of any other source.  Would like to get colonoscopy report from [facility] in order to be thorough.  She is starting on  heartburn/reflux control with medicine and having dwindling tolerance.  I think she would benefit from surgery With a hiatal hernia repair.  Probably try and do a short fundoplication as well given the fact that I could not get accurate preoperative manometry.   I discussed this in detail with she and her husband:  The anatomy & physiology of the foregut and anti-reflux mechanism was discussed.  The pathophysiology of hiatal herniation and GERD was discussed.  Natural history risks without surgery was discussed.   The patient's symptoms are not adequately controlled by medicines and other non-operative treatments.  I feel the risks of no intervention will lead to serious problems that outweigh the operative risks; therefore, I recommended surgery to reduce the hiatal hernia out of the chest and fundoplication to rebuild the anti-reflux valve and control reflux better.  Need for a thorough workup to rule out the differential diagnosis and plan treatment was explained.  I explained laparoscopic techniques with possible need for an open approach.  Risks such as bleeding, infection, abscess, leak, need for further treatment, heart attack, death, and other risks were discussed.   I noted a good likelihood this will help address the problem.  Goals of post-operative recovery were discussed as well.  Possibility that this will not correct all symptoms was explained.  Post-operative dysphagia, need for short-term liquid & pureed diet, inability to vomit, possibility of reherniation, possible need for medicines to help control symptoms in addition to surgery were discussed.  We will work to minimize complications.   Educational handouts further explaining the pathology, treatment options, and dysphagia diet was given as well.  Questions were answered.  The patient expresses understanding & wishes to proceed with surgery.  Given her obesity, she is at high risk for recurrence.  The trend is to go away from mesh  reinforcement, but we will see.  Hopefully we will not need to do a Collis gastroplasty esophageal lengthening procedure.  But we will see...  Cardiac clearance done.  I recommend she get the umbilical hernia repaired at the same time.  This allows me to do diagnostic laparoscopy to make sure there are no other surprises for her periumbilical abdominal pain.  She her husband agree.  Hopefully the small left edge of suture repair as needed, but her obesity makes him wonder if she will need a mesh patch repair as well.  We will see.  The anatomy & physiology of the abdominal wall and pelvic floor was discussed.  The pathophysiology of hernias in the inguinal and pelvic region was discussed.  Natural history risks such as progressive enlargement, pain, incarceration & strangulation was discussed.   Contributors to complications such as smoking, obesity, diabetes, prior surgery, etc were discussed.    I feel the risks of no intervention will lead to serious problems that outweigh the operative risks; therefore, I recommended surgery to reduce and repair the hernia.  I explained laparoscopic techniques with possible need for an open approach.  I noted usual use of mesh to patch and/or buttress hernia repair  Risks such as bleeding, infection, abscess, need for further treatment, heart attack, death, and other risks were discussed.  I noted a good likelihood this will help address the problem.   Goals of post-operative recovery were discussed as well.  Possibility that this will not correct all symptoms was explained.  I stressed the importance of low-impact activity, aggressive pain control, avoiding constipation, & not pushing through pain to minimize risk of post-operative chronic pain or injury. Possibility of reherniation was discussed.  We will work to minimize complications.     An educational handout further explaining the pathology & treatment options was given as well.  Questions were answered.  The  patient expresses understanding & wishes to proceed with surgery.

## 2012-04-02 ENCOUNTER — Inpatient Hospital Stay (HOSPITAL_COMMUNITY): Admission: RE | Admit: 2012-04-02 | Payer: Medicare PPO | Source: Ambulatory Visit

## 2012-04-06 ENCOUNTER — Ambulatory Visit: Payer: Medicare PPO | Admitting: Gastroenterology

## 2012-04-06 ENCOUNTER — Encounter (HOSPITAL_COMMUNITY)
Admission: RE | Admit: 2012-04-06 | Discharge: 2012-04-06 | Disposition: A | Discharge status: Home / Self Care | Payer: Medicare PPO | Source: Ambulatory Visit | Attending: Surgery | Admitting: Surgery

## 2012-04-06 ENCOUNTER — Telehealth: Payer: Self-pay | Admitting: Gastroenterology

## 2012-04-06 ENCOUNTER — Encounter (HOSPITAL_COMMUNITY): Payer: Self-pay

## 2012-04-06 DIAGNOSIS — C801 Malignant (primary) neoplasm, unspecified: Secondary | ICD-10-CM

## 2012-04-06 HISTORY — DX: Malignant (primary) neoplasm, unspecified: C80.1

## 2012-04-06 HISTORY — PX: BREAST SURGERY: SHX581

## 2012-04-06 LAB — BASIC METABOLIC PANEL
Calcium: 9.7 mg/dL (ref 8.4–10.5)
Chloride: 103 mEq/L (ref 96–112)
Creatinine, Ser: 0.97 mg/dL (ref 0.50–1.10)
GFR calc Af Amer: 66 mL/min — ABNORMAL LOW (ref >90)
Sodium: 137 mEq/L (ref 135–145)

## 2012-04-06 LAB — CBC
MCH: 33.5 pg (ref 26.0–34.0)
MCV: 98.5 fL (ref 78.0–100.0)
Platelets: 262 10*3/uL (ref 150–400)
RBC: 3.25 MIL/uL — ABNORMAL LOW (ref 3.87–5.11)
RDW: 13.5 % (ref 11.5–15.5)
WBC: 4.4 10*3/uL (ref 4.0–10.5)

## 2012-04-06 LAB — SURGICAL PCR SCREEN
MRSA, PCR: NEGATIVE
Staphylococcus aureus: NEGATIVE

## 2012-04-06 NOTE — Patient Instructions (Addendum)
19 Nicole Meyer  04/06/2012   Your procedure is scheduled on: 2-19  -2014  Report to [facility] at   0615     AM .  Call this number if you have problems the morning of surgery: [phone]  Or Presurgical Testing [phone](Keren Alverio)      Do not eat food:After Midnight.    Take these medicines the morning of surgery with A SIP OF WATER: Omeprazole. Percocet. Use Albuterol nebulizer as usual.   Do not wear jewelry, make-up or nail polish.  Do not wear lotions, powders, or perfumes. You may wear deodorant.  Do not shave 12 hours prior to first CHG shower(legs and under arms).(face and neck okay.)  Do not bring valuables to the hospital.  Contacts, dentures or bridgework,body piercing,  may not be worn into surgery.  Leave suitcase in the car. After surgery it may be brought to your room.  For patients admitted to the hospital, checkout time is 11:00 AM the day of discharge.   Patients discharged the day of surgery will not be allowed to drive home. Must have responsible person with you x 24 hours once discharged.  Name and phone number of your driver: Arlinda Barcelona, spouse [phone] cell  Special Instructions: CHG Shower Use Special Wash: see special instructions.(avoid face and genitals)   Please read over the following fact sheets that you were given: MRSA Information.    Failure to follow these instructions may result in Cancellation of your surgery.   Patient signature_______________________________________________________

## 2012-04-06 NOTE — Telephone Encounter (Signed)
Left message for pt to call back  °

## 2012-04-06 NOTE — Pre-Procedure Instructions (Signed)
04-06-12 EKG/CXR 11'13-Epic

## 2012-04-07 MED ORDER — GENTAMICIN SULFATE 40 MG/ML IJ SOLN
320.0000 mg | INTRAVENOUS | Status: AC
  Administered 2012-04-08: 320 mg via INTRAVENOUS
  Filled 2012-04-07 (×2): qty 8

## 2012-04-07 NOTE — Anesthesia Preprocedure Evaluation (Addendum)
Anesthesia Evaluation  Patient identified by MRN, date of birth, ID band Patient awake    Reviewed: Allergy & Precautions, H&P , NPO status , Patient's Chart, lab work & pertinent test results  Airway Mallampati: II TM Distance: >3 FB Neck ROM: full    Dental  (+) Edentulous Upper, Edentulous Lower and Dental Advisory Given   Pulmonary asthma , COPD breath sounds clear to auscultation  Pulmonary exam normal       Cardiovascular Exercise Tolerance: Good hypertension, Pt. on medications Rhythm:regular Rate:Normal  Syncope 12/26/11.  Non cardiac CP due to hernia   Neuro/Psych negative neurological ROS  negative psych ROS   GI/Hepatic negative GI ROS, Neg liver ROS, hiatal hernia, GERD-  Medicated and Controlled,  Endo/Other  negative endocrine ROS  Renal/GU Renal diseasenegative Renal ROSStage 3 chronic kidney disease.  Labs look good  negative genitourinary   Musculoskeletal   Abdominal   Peds  Hematology  (+) Blood dyscrasia, anemia , Hgb 10.9   Anesthesia Other Findings   Reproductive/Obstetrics negative OB ROS                         Anesthesia Physical Anesthesia Plan  ASA: III  Anesthesia Plan: General   Post-op Pain Management:    Induction: Intravenous  Airway Management Planned: Oral ETT  Additional Equipment:   Intra-op Plan:   Post-operative Plan: Extubation in OR  Informed Consent: I have reviewed the patients History and Physical, chart, labs and discussed the procedure including the risks, benefits and alternatives for the proposed anesthesia with the patient or authorized representative who has indicated his/her understanding and acceptance.   Dental Advisory Given  Plan Discussed with: CRNA and Surgeon  Anesthesia Plan Comments:         Anesthesia Quick Evaluation

## 2012-04-07 NOTE — Telephone Encounter (Signed)
Left message for pt to call back.  Pt thought she had an appt with Dr. Arlyce Dice 04/08/12 and she wanted to cancel it if she did. Let pt know she did not have an appt that day and she is aware.

## 2012-04-08 ENCOUNTER — Encounter (HOSPITAL_COMMUNITY)
Admission: RE | Disposition: A | Discharge status: Home / Self Care | Payer: Self-pay | Source: Ambulatory Visit | Attending: Surgery

## 2012-04-08 ENCOUNTER — Inpatient Hospital Stay (HOSPITAL_COMMUNITY)
Admission: RE | Admit: 2012-04-08 | Discharge: 2012-04-11 | DRG: 328 | Disposition: A | Discharge status: Home / Self Care | Payer: Medicare PPO | Source: Ambulatory Visit | Attending: Surgery | Admitting: Surgery

## 2012-04-08 ENCOUNTER — Observation Stay (HOSPITAL_COMMUNITY): Payer: Medicare PPO

## 2012-04-08 ENCOUNTER — Ambulatory Visit (HOSPITAL_COMMUNITY): Payer: Medicare PPO | Admitting: Anesthesiology

## 2012-04-08 ENCOUNTER — Encounter (HOSPITAL_COMMUNITY): Payer: Self-pay | Admitting: Anesthesiology

## 2012-04-08 ENCOUNTER — Ambulatory Visit: Payer: Medicare PPO | Admitting: Gastroenterology

## 2012-04-08 ENCOUNTER — Encounter (HOSPITAL_COMMUNITY): Payer: Self-pay | Admitting: *Deleted

## 2012-04-08 DIAGNOSIS — J449 Chronic obstructive pulmonary disease, unspecified: Secondary | ICD-10-CM | POA: Diagnosis present

## 2012-04-08 DIAGNOSIS — N183 Chronic kidney disease, stage 3 unspecified: Secondary | ICD-10-CM | POA: Diagnosis present

## 2012-04-08 DIAGNOSIS — R079 Chest pain, unspecified: Secondary | ICD-10-CM | POA: Diagnosis present

## 2012-04-08 DIAGNOSIS — K449 Diaphragmatic hernia without obstruction or gangrene: Principal | ICD-10-CM | POA: Diagnosis present

## 2012-04-08 DIAGNOSIS — E440 Moderate protein-calorie malnutrition: Secondary | ICD-10-CM | POA: Diagnosis present

## 2012-04-08 DIAGNOSIS — E669 Obesity, unspecified: Secondary | ICD-10-CM | POA: Diagnosis present

## 2012-04-08 DIAGNOSIS — D649 Anemia, unspecified: Secondary | ICD-10-CM

## 2012-04-08 DIAGNOSIS — Z6832 Body mass index (BMI) 32.0-32.9, adult: Secondary | ICD-10-CM

## 2012-04-08 DIAGNOSIS — K589 Irritable bowel syndrome without diarrhea: Secondary | ICD-10-CM | POA: Diagnosis present

## 2012-04-08 DIAGNOSIS — K429 Umbilical hernia without obstruction or gangrene: Secondary | ICD-10-CM

## 2012-04-08 DIAGNOSIS — R1310 Dysphagia, unspecified: Secondary | ICD-10-CM | POA: Diagnosis present

## 2012-04-08 DIAGNOSIS — K440 Diaphragmatic hernia with obstruction, without gangrene: Secondary | ICD-10-CM | POA: Diagnosis present

## 2012-04-08 DIAGNOSIS — J4489 Other specified chronic obstructive pulmonary disease: Secondary | ICD-10-CM | POA: Diagnosis present

## 2012-04-08 HISTORY — PX: LAPAROSCOPIC NISSEN FUNDOPLICATION: SHX1932

## 2012-04-08 LAB — CREATININE, SERUM
Creatinine, Ser: 0.95 mg/dL (ref 0.50–1.10)
GFR calc non Af Amer: 58 mL/min — ABNORMAL LOW (ref >90)

## 2012-04-08 LAB — CBC
MCHC: 33.7 g/dL (ref 30.0–36.0)
Platelets: 182 10*3/uL (ref 150–400)
RDW: 13.5 % (ref 11.5–15.5)

## 2012-04-08 SURGERY — FUNDOPLICATION, NISSEN, LAPAROSCOPIC
Anesthesia: General | Site: Abdomen | Wound class: Clean

## 2012-04-08 MED ORDER — PANTOPRAZOLE SODIUM 40 MG PO TBEC
40.0000 mg | DELAYED_RELEASE_TABLET | Freq: Every day | ORAL | Status: DC
  Administered 2012-04-08 – 2012-04-11 (×4): 40 mg via ORAL
  Filled 2012-04-08 (×5): qty 1

## 2012-04-08 MED ORDER — NITROGLYCERIN 0.4 MG SL SUBL
SUBLINGUAL_TABLET | SUBLINGUAL | Status: AC
  Administered 2012-04-08: 16:00:00
  Filled 2012-04-08: qty 25

## 2012-04-08 MED ORDER — MIDAZOLAM HCL 5 MG/5ML IJ SOLN
INTRAMUSCULAR | Status: DC | PRN
  Administered 2012-04-08: 2 mg via INTRAVENOUS

## 2012-04-08 MED ORDER — SODIUM CHLORIDE 0.9 % IJ SOLN: 3.0000 mL | INTRAMUSCULAR | Status: DC | PRN

## 2012-04-08 MED ORDER — EPHEDRINE SULFATE 50 MG/ML IJ SOLN
INTRAMUSCULAR | Status: DC | PRN
  Administered 2012-04-08 (×2): 10 mg via INTRAVENOUS
  Administered 2012-04-08: 5 mg via INTRAVENOUS

## 2012-04-08 MED ORDER — ACETAMINOPHEN 10 MG/ML IV SOLN
INTRAVENOUS | Status: DC | PRN
  Administered 2012-04-08: 1000 mg via INTRAVENOUS

## 2012-04-08 MED ORDER — CLINDAMYCIN PHOSPHATE 900 MG/50ML IV SOLN
900.0000 mg | Freq: Four times a day (QID) | INTRAVENOUS | Status: AC
  Administered 2012-04-08: 900 mg via INTRAVENOUS
  Filled 2012-04-08 (×2): qty 50

## 2012-04-08 MED ORDER — LISINOPRIL 10 MG PO TABS
10.0000 mg | ORAL_TABLET | Freq: Every day | ORAL | Status: DC
  Administered 2012-04-09 – 2012-04-11 (×3): 10 mg via ORAL
  Filled 2012-04-08 (×3): qty 1

## 2012-04-08 MED ORDER — HYDROMORPHONE HCL PF 1 MG/ML IJ SOLN: 0.2500 mg | INTRAMUSCULAR | Status: DC | PRN

## 2012-04-08 MED ORDER — SACCHAROMYCES BOULARDII 250 MG PO CAPS
250.0000 mg | ORAL_CAPSULE | Freq: Two times a day (BID) | ORAL | Status: DC
  Administered 2012-04-08 – 2012-04-11 (×6): 250 mg via ORAL
  Filled 2012-04-08 (×7): qty 1

## 2012-04-08 MED ORDER — PROMETHAZINE HCL 25 MG/ML IJ SOLN: 12.5000 mg | Freq: Four times a day (QID) | INTRAMUSCULAR | Status: DC | PRN

## 2012-04-08 MED ORDER — PROMETHAZINE HCL 25 MG RE SUPP: 25.0000 mg | Freq: Four times a day (QID) | RECTAL | Status: DC | PRN

## 2012-04-08 MED ORDER — METOCLOPRAMIDE HCL 5 MG/ML IJ SOLN
INTRAMUSCULAR | Status: DC | PRN
  Administered 2012-04-08: 10 mg via INTRAVENOUS

## 2012-04-08 MED ORDER — DEXAMETHASONE SODIUM PHOSPHATE 10 MG/ML IJ SOLN: INTRAMUSCULAR | Status: DC | PRN

## 2012-04-08 MED ORDER — MENTHOL 3 MG MT LOZG: 1.0000 | LOZENGE | OROMUCOSAL | Status: DC | PRN

## 2012-04-08 MED ORDER — ACETAMINOPHEN 10 MG/ML IV SOLN
INTRAVENOUS | Status: AC
  Filled 2012-04-08: qty 100

## 2012-04-08 MED ORDER — ONDANSETRON HCL 4 MG/2ML IJ SOLN: 4.0000 mg | Freq: Four times a day (QID) | INTRAMUSCULAR | Status: DC | PRN

## 2012-04-08 MED ORDER — CLINDAMYCIN PHOSPHATE 900 MG/50ML IV SOLN
INTRAVENOUS | Status: AC
  Filled 2012-04-08: qty 50

## 2012-04-08 MED ORDER — ACETAMINOPHEN 500 MG PO TABS
1000.0000 mg | ORAL_TABLET | Freq: Three times a day (TID) | ORAL | Status: DC
  Administered 2012-04-08 – 2012-04-11 (×9): 1000 mg via ORAL
  Filled 2012-04-08 (×11): qty 2

## 2012-04-08 MED ORDER — NITROGLYCERIN 0.4 MG SL SUBL: 0.4000 mg | SUBLINGUAL_TABLET | SUBLINGUAL | Status: DC | PRN

## 2012-04-08 MED ORDER — SUCCINYLCHOLINE CHLORIDE 20 MG/ML IJ SOLN
INTRAMUSCULAR | Status: DC | PRN
  Administered 2012-04-08: 100 mg via INTRAVENOUS

## 2012-04-08 MED ORDER — ONDANSETRON HCL 4 MG/2ML IJ SOLN
INTRAMUSCULAR | Status: DC | PRN
  Administered 2012-04-08: 4 mg via INTRAVENOUS

## 2012-04-08 MED ORDER — PROPOFOL 10 MG/ML IV BOLUS
INTRAVENOUS | Status: DC | PRN
  Administered 2012-04-08: 160 mg via INTRAVENOUS

## 2012-04-08 MED ORDER — HYDROMORPHONE HCL PF 1 MG/ML IJ SOLN
INTRAMUSCULAR | Status: DC | PRN
  Administered 2012-04-08 (×4): 0.5 mg via INTRAVENOUS

## 2012-04-08 MED ORDER — CHLORHEXIDINE GLUCONATE 4 % EX LIQD
1.0000 "application " | Freq: Once | CUTANEOUS | Status: DC
  Filled 2012-04-08: qty 15

## 2012-04-08 MED ORDER — HYDROMORPHONE HCL PF 1 MG/ML IJ SOLN
0.5000 mg | INTRAMUSCULAR | Status: DC | PRN
  Administered 2012-04-08: 0.5 mg via INTRAVENOUS
  Administered 2012-04-08 – 2012-04-09 (×5): 1 mg via INTRAVENOUS
  Administered 2012-04-10: 2 mg via INTRAVENOUS
  Administered 2012-04-10 (×2): 1 mg via INTRAVENOUS
  Filled 2012-04-08 (×2): qty 1
  Filled 2012-04-08: qty 2
  Filled 2012-04-08 (×7): qty 1

## 2012-04-08 MED ORDER — CLINDAMYCIN PHOSPHATE 900 MG/50ML IV SOLN
900.0000 mg | INTRAVENOUS | Status: AC
  Administered 2012-04-08: 900 mg via INTRAVENOUS

## 2012-04-08 MED ORDER — LIP MEDEX EX OINT
1.0000 "application " | TOPICAL_OINTMENT | Freq: Two times a day (BID) | CUTANEOUS | Status: DC
  Administered 2012-04-08 – 2012-04-11 (×6): 1 via TOPICAL
  Filled 2012-04-08 (×2): qty 7

## 2012-04-08 MED ORDER — MAGNESIUM HYDROXIDE 400 MG/5ML PO SUSP: 30.0000 mL | Freq: Two times a day (BID) | ORAL | Status: DC | PRN

## 2012-04-08 MED ORDER — ONDANSETRON HCL 4 MG/2ML IJ SOLN
4.0000 mg | Freq: Four times a day (QID) | INTRAMUSCULAR | Status: DC | PRN
  Administered 2012-04-08: 4 mg via INTRAVENOUS
  Filled 2012-04-08: qty 2

## 2012-04-08 MED ORDER — SODIUM CHLORIDE 0.9 % IV SOLN: 250.0000 mL | INTRAVENOUS | Status: DC | PRN

## 2012-04-08 MED ORDER — SODIUM CHLORIDE 0.9 % IJ SOLN
3.0000 mL | Freq: Two times a day (BID) | INTRAMUSCULAR | Status: DC
  Administered 2012-04-09 – 2012-04-10 (×2): 3 mL via INTRAVENOUS

## 2012-04-08 MED ORDER — ALBUTEROL SULFATE (5 MG/ML) 0.5% IN NEBU: 2.5000 mg | INHALATION_SOLUTION | RESPIRATORY_TRACT | Status: DC | PRN

## 2012-04-08 MED ORDER — CISATRACURIUM BESYLATE (PF) 10 MG/5ML IV SOLN
INTRAVENOUS | Status: DC | PRN
  Administered 2012-04-08: 3 mg via INTRAVENOUS
  Administered 2012-04-08: 2 mg via INTRAVENOUS
  Administered 2012-04-08: 5 mg via INTRAVENOUS

## 2012-04-08 MED ORDER — DIPHENHYDRAMINE HCL 50 MG/ML IJ SOLN: 12.5000 mg | Freq: Four times a day (QID) | INTRAMUSCULAR | Status: DC | PRN

## 2012-04-08 MED ORDER — PHENYLEPHRINE HCL 10 MG/ML IJ SOLN
INTRAMUSCULAR | Status: DC | PRN
  Administered 2012-04-08 (×2): 40 ug via INTRAVENOUS
  Administered 2012-04-08 (×2): 80 ug via INTRAVENOUS
  Administered 2012-04-08: 40 ug via INTRAVENOUS

## 2012-04-08 MED ORDER — OXYCODONE HCL 5 MG PO TABS
5.0000 mg | ORAL_TABLET | ORAL | Status: DC | PRN
  Administered 2012-04-10 – 2012-04-11 (×3): 10 mg via ORAL
  Filled 2012-04-08 (×3): qty 2

## 2012-04-08 MED ORDER — LIDOCAINE HCL (CARDIAC) 20 MG/ML IV SOLN
INTRAVENOUS | Status: DC | PRN
  Administered 2012-04-08: 50 mg via INTRAVENOUS

## 2012-04-08 MED ORDER — METOCLOPRAMIDE HCL 5 MG/ML IJ SOLN: 5.0000 mg | Freq: Four times a day (QID) | INTRAMUSCULAR | Status: DC | PRN

## 2012-04-08 MED ORDER — GLYCOPYRROLATE 0.2 MG/ML IJ SOLN
INTRAMUSCULAR | Status: DC | PRN
  Administered 2012-04-08: .7 mg via INTRAVENOUS

## 2012-04-08 MED ORDER — BUPIVACAINE-EPINEPHRINE 0.25% -1:200000 IJ SOLN
INTRAMUSCULAR | Status: AC
  Filled 2012-04-08: qty 1

## 2012-04-08 MED ORDER — ALUM & MAG HYDROXIDE-SIMETH 200-200-20 MG/5ML PO SUSP: 30.0000 mL | Freq: Four times a day (QID) | ORAL | Status: DC | PRN

## 2012-04-08 MED ORDER — LACTATED RINGERS IV SOLN
INTRAVENOUS | Status: DC
  Administered 2012-04-08: 17:00:00 via INTRAVENOUS

## 2012-04-08 MED ORDER — SUFENTANIL CITRATE 50 MCG/ML IV SOLN
INTRAVENOUS | Status: DC | PRN
  Administered 2012-04-08: 10 ug via INTRAVENOUS
  Administered 2012-04-08 (×5): 5 ug via INTRAVENOUS

## 2012-04-08 MED ORDER — STERILE WATER FOR IRRIGATION IR SOLN
Status: DC | PRN
  Administered 2012-04-08: 1500 mL

## 2012-04-08 MED ORDER — HYDROCHLOROTHIAZIDE 12.5 MG PO CAPS
12.5000 mg | ORAL_CAPSULE | Freq: Every day | ORAL | Status: DC
  Administered 2012-04-09 – 2012-04-11 (×3): 12.5 mg via ORAL
  Filled 2012-04-08 (×3): qty 1

## 2012-04-08 MED ORDER — LACTATED RINGERS IV SOLN
INTRAVENOUS | Status: DC | PRN
  Administered 2012-04-08 (×3): via INTRAVENOUS

## 2012-04-08 MED ORDER — MAGIC MOUTHWASH
15.0000 mL | Freq: Four times a day (QID) | ORAL | Status: DC | PRN
  Filled 2012-04-08: qty 15

## 2012-04-08 MED ORDER — HEPARIN SODIUM (PORCINE) 5000 UNIT/ML IJ SOLN
5000.0000 [IU] | Freq: Three times a day (TID) | INTRAMUSCULAR | Status: DC
  Administered 2012-04-09 – 2012-04-10 (×6): 5000 [IU] via SUBCUTANEOUS
  Filled 2012-04-08 (×10): qty 1

## 2012-04-08 MED ORDER — BISACODYL 10 MG RE SUPP: 10.0000 mg | Freq: Two times a day (BID) | RECTAL | Status: DC | PRN

## 2012-04-08 MED ORDER — ONDANSETRON 4 MG PO TBDP
4.0000 mg | ORAL_TABLET | Freq: Four times a day (QID) | ORAL | Status: DC | PRN
  Filled 2012-04-08: qty 2

## 2012-04-08 MED ORDER — ASPIRIN 81 MG PO TABS: 81.0000 mg | ORAL_TABLET | Freq: Every day | ORAL | Status: DC

## 2012-04-08 MED ORDER — BUPIVACAINE-EPINEPHRINE PF 0.25-1:200000 % IJ SOLN
INTRAMUSCULAR | Status: DC | PRN
  Administered 2012-04-08: 50 mL

## 2012-04-08 MED ORDER — PSYLLIUM 95 % PO PACK
1.0000 | PACK | Freq: Two times a day (BID) | ORAL | Status: DC
  Administered 2012-04-08 – 2012-04-11 (×6): 1 via ORAL
  Filled 2012-04-08 (×7): qty 1

## 2012-04-08 MED ORDER — 0.9 % SODIUM CHLORIDE (POUR BTL) OPTIME
TOPICAL | Status: DC | PRN
  Administered 2012-04-08: 1000 mL

## 2012-04-08 MED ORDER — OXYCODONE HCL 5 MG PO TABS: 5.0000 mg | ORAL_TABLET | ORAL | Status: DC | PRN

## 2012-04-08 MED ORDER — LISINOPRIL-HYDROCHLOROTHIAZIDE 20-12.5 MG PO TABS: 1.0000 | ORAL_TABLET | Freq: Every morning | ORAL | Status: DC

## 2012-04-08 MED ORDER — NEOSTIGMINE METHYLSULFATE 1 MG/ML IJ SOLN
INTRAMUSCULAR | Status: DC | PRN
  Administered 2012-04-08: 4.5 mg via INTRAVENOUS

## 2012-04-08 MED ORDER — ROCURONIUM BROMIDE 100 MG/10ML IV SOLN
INTRAVENOUS | Status: DC | PRN
  Administered 2012-04-08: 5 mg via INTRAVENOUS

## 2012-04-08 MED ORDER — LACTATED RINGERS IR SOLN
Status: DC | PRN
  Administered 2012-04-08: 3000 mL

## 2012-04-08 MED ORDER — ONDANSETRON 8 MG/NS 50 ML IVPB
8.0000 mg | Freq: Four times a day (QID) | INTRAVENOUS | Status: DC | PRN
  Filled 2012-04-08: qty 8

## 2012-04-08 MED ORDER — LACTATED RINGERS IV SOLN: INTRAVENOUS | Status: DC

## 2012-04-08 MED ORDER — PHENOL 1.4 % MT LIQD
2.0000 | OROMUCOSAL | Status: DC | PRN
  Administered 2012-04-09 (×2): 2 via OROMUCOSAL
  Filled 2012-04-08: qty 177

## 2012-04-08 MED ORDER — ONE-DAILY MULTI VITAMINS PO TABS: 1.0000 | ORAL_TABLET | Freq: Every day | ORAL | Status: DC

## 2012-04-08 MED ORDER — ASPIRIN EC 81 MG PO TBEC
81.0000 mg | DELAYED_RELEASE_TABLET | Freq: Every day | ORAL | Status: DC
  Administered 2012-04-09 – 2012-04-11 (×3): 81 mg via ORAL
  Filled 2012-04-08 (×3): qty 1

## 2012-04-08 MED ORDER — ADULT MULTIVITAMIN W/MINERALS CH
1.0000 | ORAL_TABLET | Freq: Every day | ORAL | Status: DC
  Administered 2012-04-09 – 2012-04-11 (×3): 1 via ORAL
  Filled 2012-04-08 (×3): qty 1

## 2012-04-08 MED ORDER — DEXAMETHASONE SODIUM PHOSPHATE 10 MG/ML IJ SOLN
INTRAMUSCULAR | Status: DC | PRN
  Administered 2012-04-08: 10 mg via INTRAVENOUS

## 2012-04-08 MED ORDER — PROMETHAZINE HCL 12.5 MG PO TABS: 12.5000 mg | ORAL_TABLET | Freq: Four times a day (QID) | ORAL | Status: DC | PRN

## 2012-04-08 MED ORDER — LACTATED RINGERS IV BOLUS (SEPSIS): 1000.0000 mL | Freq: Three times a day (TID) | INTRAVENOUS | Status: AC | PRN

## 2012-04-08 SURGICAL SUPPLY — 62 items
APPLIER CLIP 5 13 M/L LIGAMAX5 (MISCELLANEOUS)
APPLIER CLIP ROT 10 11.4 M/L (STAPLE)
CABLE HIGH FREQUENCY MONO STRZ (ELECTRODE) ×2 IMPLANT
CANISTER SUCTION 2500CC (MISCELLANEOUS) IMPLANT
CHLORAPREP W/TINT 26ML (MISCELLANEOUS) ×2 IMPLANT
CLIP APPLIE 5 13 M/L LIGAMAX5 (MISCELLANEOUS) IMPLANT
CLIP APPLIE ROT 10 11.4 M/L (STAPLE) IMPLANT
CLOTH BEACON ORANGE TIMEOUT ST (SAFETY) ×2 IMPLANT
DECANTER SPIKE VIAL GLASS SM (MISCELLANEOUS) ×2 IMPLANT
DRAIN CHANNEL RND F F (WOUND CARE) ×2 IMPLANT
DRAIN PENROSE 18X1/2 LTX STRL (DRAIN) ×2 IMPLANT
DRAPE LAPAROSCOPIC ABDOMINAL (DRAPES) ×2 IMPLANT
DRAPE UTILITY 15X26 (DRAPE) ×2 IMPLANT
DRAPE WARM FLUID 44X44 (DRAPE) ×2 IMPLANT
DRSG TEGADERM 2-3/8X2-3/4 SM (GAUZE/BANDAGES/DRESSINGS) ×2 IMPLANT
DRSG TEGADERM 4X4.75 (GAUZE/BANDAGES/DRESSINGS) ×4 IMPLANT
ELECT REM PT RETURN 9FT ADLT (ELECTROSURGICAL) ×2
ELECTRODE REM PT RTRN 9FT ADLT (ELECTROSURGICAL) ×1 IMPLANT
EVACUATOR SILICONE 100CC (DRAIN) ×2 IMPLANT
FELT TEFLON 4 X1 (Mesh General) ×2 IMPLANT
FILTER SMOKE EVAC LAPAROSHD (FILTER) IMPLANT
GAUZE SPONGE 2X2 8PLY STRL LF (GAUZE/BANDAGES/DRESSINGS) ×3 IMPLANT
GLOVE BIOGEL PI IND STRL 7.0 (GLOVE) ×1 IMPLANT
GLOVE BIOGEL PI INDICATOR 7.0 (GLOVE) ×1
GLOVE ECLIPSE 8.0 STRL XLNG CF (GLOVE) ×2 IMPLANT
GLOVE INDICATOR 8.0 STRL GRN (GLOVE) ×2 IMPLANT
GOWN STRL NON-REIN LRG LVL3 (GOWN DISPOSABLE) IMPLANT
GOWN STRL REIN XL XLG (GOWN DISPOSABLE) ×8 IMPLANT
HAND ACTIVATED (MISCELLANEOUS) ×2 IMPLANT
KIT BASIN OR (CUSTOM PROCEDURE TRAY) ×2 IMPLANT
LEGGING LITHOTOMY PAIR STRL (DRAPES) ×2 IMPLANT
MARKER SKIN DUAL TIP RULER LAB (MISCELLANEOUS) ×2 IMPLANT
NS IRRIG 1000ML POUR BTL (IV SOLUTION) ×2 IMPLANT
PENCIL BUTTON HOLSTER BLD 10FT (ELECTRODE) ×2 IMPLANT
PUNCH BIOPSY 3 (MISCELLANEOUS) IMPLANT
SCISSORS LAP 5X35 DISP (ENDOMECHANICALS) ×2 IMPLANT
SEALANT SURGICAL APPL DUAL CAN (MISCELLANEOUS) IMPLANT
SET IRRIG TUBING LAPAROSCOPIC (IRRIGATION / IRRIGATOR) ×2 IMPLANT
SOLUTION ANTI FOG 6CC (MISCELLANEOUS) IMPLANT
SPONGE DRAIN TRACH 4X4 STRL 2S (GAUZE/BANDAGES/DRESSINGS) IMPLANT
SPONGE GAUZE 2X2 STER 10/PKG (GAUZE/BANDAGES/DRESSINGS) ×3
STAPLER VISISTAT 35W (STAPLE) ×2 IMPLANT
SUT 0 0 DBL MH NEEDLE ETHIBOND (SUTURE) IMPLANT
SUT ETHIBOND 0 (SUTURE) IMPLANT
SUT ETHIBOND 0 36 GRN (SUTURE) ×6 IMPLANT
SUT ETHIBOND CT1 BRD #0 30IN (SUTURE) IMPLANT
SUT ETHIBOND NAB CT1 #1 30IN (SUTURE) ×4 IMPLANT
SUT ETHILON 2 0 PS N (SUTURE) IMPLANT
SUT MNCRL AB 4-0 PS2 18 (SUTURE) ×2 IMPLANT
SUT PDS AB 3-0 SH 27 (SUTURE) IMPLANT
SUT PDS AB 4-0 SH 27 (SUTURE) IMPLANT
SUT VICRYL 0 TIES 12 18 (SUTURE) IMPLANT
TIP INNERVISION DETACH 40FR (MISCELLANEOUS) IMPLANT
TIP INNERVISION DETACH 50FR (MISCELLANEOUS) IMPLANT
TIP INNERVISION DETACH 56FR (MISCELLANEOUS) ×2 IMPLANT
TIPS INNERVISION DETACH 40FR (MISCELLANEOUS)
TOWEL OR 17X26 10 PK STRL BLUE (TOWEL DISPOSABLE) ×4 IMPLANT
TRAY FOLEY CATH 14FRSI W/METER (CATHETERS) ×2 IMPLANT
TRAY LAP CHOLE (CUSTOM PROCEDURE TRAY) ×2 IMPLANT
TROCAR Z-THREAD FIOS 5X100MM (TROCAR) ×2 IMPLANT
TUBING FILTER THERMOFLATOR (ELECTROSURGICAL) ×2 IMPLANT
WATER STERILE IRR 1500ML POUR (IV SOLUTION) ×2 IMPLANT

## 2012-04-08 NOTE — Significant Event (Signed)
Rapid Response Event Note  Overview: Pt post op day Lap Nissen, denies cardiac history.      Initial Focused Assessment: Chest discomfort   Interventions: Initiate standard chest pain protocol but discuss with pt possibilities of surgery related discomfort. Pt' s discomfoort already improved from 9/10 after dilaudid to 6/10. 12 lead EKG unimpressive. O2 increaesed to 4 LNC. SL ntg given. RN states CXR already obtained.   Event Summary: Dr Michaell Cowing notified , see orders.   at      at          [facility], Ferd Hibbs

## 2012-04-08 NOTE — Progress Notes (Signed)
Dr. Michaell Cowing notified regarding patient chest pain decreased to 5/10 in right chest after 1 nitro, 1mg  dilaudid.  Blood pressure 129/87 and heart rate 92.  Patient lying in bed with eyes closed.  Will give a second nitro per order.  Francene Finders, RN

## 2012-04-08 NOTE — Interval H&P Note (Signed)
History and Physical Interval Note:  04/08/2012 8:34 AM  Nicole Meyer  has presented today for surgery, with the diagnosis of paraesophageal hiatal hernia with chest pain and anemia  The various methods of treatment have been discussed with the patient and family. After consideration of risks, benefits and other options for treatment, the patient has consented to  Procedure(s) with comments: Laparoscopic Reduction and Repair of Paraesophagel Hiatal Hernia with Nissen (N/A) - Laparoscopic Reduction and Repair of Paraesophagel Hiatal Hernia with Nissen as a surgical intervention .  The patient's history has been reviewed, patient examined, no change in status, stable for surgery.  I have reviewed the patient's chart and labs.  Questions were answered to the patient's satisfaction.     Cortni Tays C.

## 2012-04-08 NOTE — Op Note (Signed)
04/08/2012  11:37 AM  PATIENT:  Nicole Meyer  72 y.o. 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 Laparoscopic reduction and primary repair of paraesophageal hiatal     Hernia over pledgets. 2. Nissen fundoplication 2cm over a 56-French bougie. 3. Anterior & posterior gastropexy 4. Type 2 mediastinal dissection.  SURGEON:  Surgeon(s): Ardeth Sportsman, MD Romie Levee, MD - Assisting  ANESTHESIA:   local and general  EBL:  Total I/O In: 2000 [I.V.:2000] Out: 290 [Urine:270; Blood:20]  Delay start of Pharmacological VTE agent (>24hrs) due to surgical blood loss or risk of bleeding:  no  ANESTHESIA: 1. General anesthesia. 2. Local anesthetic in a field block around all port sites.  SPECIMEN:  Mediastinal hernia sac (not sent).  DRAINS:  A 19-French Blake drain goes from the right upper quadrant along the lesser curvature of the stomach into the mediastinum.  COUNTS:  YES  PLAN OF CARE: Admit for overnight observation  PATIENT DISPOSITION:  PACU - hemodynamically stable.  INDICATION:   Patient with symptomatic paraesophageal hiatal hernia.  The patient has had extensive work-up & is felt to benefit from repair:  The anatomy & physiology of the foregut and anti-reflux mechanism was discussed.  The pathophysiology of hiatal herniation and GERD was discussed.  Natural history risks without surgery was discussed.   The patient's symptoms are not adequately controlled by medicines and other non-operative treatments.  I feel the risks of no intervention will lead to serious  problems that outweigh the operative risks; therefore, I recommended surgery to reduce the hiatal hernia out of the chest and fundoplication to rebuild the anti-reflux valve and control reflux better.  Need for a thorough workup to rule out the differential diagnosis and plan treatment was explained.  I explained laparoscopic techniques with possible need for an open approach.  Risks such as bleeding, infection, abscess, leak, need for further treatment, heart attack, death, and other risks were discussed.   I noted a good likelihood this will help address the problem.  Goals of post-operative recovery were discussed as well.  Possibility that this will not correct all symptoms was explained.  Post-operative dysphagia, need for short-term liquid & pureed diet, inability to vomit, possibility of reherniation, possible need for medicines to help control symptoms in addition to surgery were discussed.  We will work to minimize complications.   Educational handouts further explaining the pathology, treatment options, and dysphagia diet was given as well.  Questions were answered.  The patient expresses understanding & wishes to proceed with surgery.   OR FINDINGS:   Moderate-sized paraesophageal hiatal hernia with 50% of the stomach in the mediastinum.  There was a 8x10 hiatal defect.  It is a primary repair over pledgets.   The patient has a 2 cm Nissen fundoplication that was done over 56-French bougie.  The patient has had anterior and posterior gastropexy.  DESCRIPTION:   Informed consent was confirmed.  The patient received IV antibiotics prior to incision.  The underwent general anesthesia without difficulty.  A Tassin catheter sterilely placed.  The patient was positioned in split leg with arms tucked. The abdomen was prepped and draped  in the sterile fashion.  Surgical time-out confirmed our plan.  I placed a 5 mm port in the left upper quadrant paramedian region using optical entry  technique with the patient in steep reverse Trendelenburg and left side up.  Entry was clean.  We induced carbon dioxide insufflation.  Camera inspection revealed no injury.  Under Direct visualization, I placed 5 mm port in the right mid abdomen and a 10 mm port on the anterior axillary line on the left subcostal ridge.  I also placed a 5 mm port in the left subxiphoid region under direct visualization.  I removed that and placed an Omega-shaped rigid Nathanson liver retractor to lift the left lateral sector of the liver anteriorly to expose the esophageal hiatus.  This was secured to the bed using the iron man system.  I placed another 5mm port in the subxiphoid region.  We grasped the anterior mediastinal sac at the apex of the crus.  I scored through that and got into the anterior mediastinum.  I was able to free the mediastinal sac from its attachments to the pericardium and bilateral pleura using primarily focused gentle blunt dissection as well as focused ultrasonic Harmonic dissection.  I transected phrenoesophageal attachments to the inner right crus, preserving a 1-2 centimeter cuff of mediastinal sac until I found the base of the crura.  I then came around anteriorly on the left side and freed up the phrenoesophageal attachments of the mediastinal sac on the medial part of the left crus on the superior half.  I did careful mediastinal dissection to free the mediastinal sac.  With that, we could relieve the suction cup affect of the hernia sac and help reduce the stomach back down into the abdomen, flipped back approriately.   Interestingly, there is more of a posterior mediastinal hernia sac than anterior.  We ligated the short gastrics along the lesser curvature of the stomach about a third way down and then came up over the fundus.  We released the attachments of the stomach to the retroperitoneum until we were able to connect with the prior dissection on the left crus.  We  completed the release of  phrenoesophageal attachments to the medial part of the left crus down to its base.  With this, we had circumferential mobilization.    We placed the stomach and esophagus on axial tension.  I then did a type 2 mediastinal dissection where I freed the esophagus from its attachments to the aorta, pleura, and pericardium using primarily gentle blunt as well as focused ultrasonic dissection.  We saw the anterior vagus nervey intact anteriorly.  The posterior was intact as well.  We preserved it at all times.  I procedded to dissect about 20 cm up into the mediastinum.  With that I could straighten out the esophagus and get 3 cm of intra-abdominal length of the esophagus at a best estimation off tension, 8cm on tension.  I freed the anterior mediastinal sac.  We saw the anterior vagus nerve and freed the sac left of the vagus and skeletonized some left laterall to the vagus nerve as well.  I dissected out & removed the anterior epiphrenic pad.  Posterior epiphrenic fat was skeletonized and freed off as well - much larger. With that, I could better define the esophagogastric junction.  I confirmed and I did have 3 cm of intra-abdominal esophageal length off tension.  I brought the fundus of the stomach posterior to the esophagus over to the right side.  The wrap was mobile with the classic shoe shine maneuver.  Wrap became together gently.  We reflected the stomach left laterally and closed the esophageal hiatus using 0 Ethibond stitch using horizontal mattress stitches with pledgets on both sides.  I did that x3 stitches.  The crura had good substance >1cm thickness, and they came together well without any tension.  I reset up the and Nissen wrap and did a posterior gastropexy by taking couple of #1 Ethibond stitches to the posterior part of the right side of the wrap and thru the crural closure and tied that down for good posterior gastropexy up against the hiatal  closure.  I then did a left anterior gastropexy taking the apex of the left side of the wrap and tacked it to the left anterior crura just posterior to the phrenic vein. I tied that down.  This helped the wrap for completion.  Next, Anesthesia passed a 56-French bougie transorally.  It was a lighted bougie and we did do this under axial tension.  It passed down easily without resistance.  I did a classic 2 cm Nissen fundoplication on the true esophagus above the cardia using Ethibond stitch in the left side of the wrap, then anterior esophagus, and then right side of the wrap and tied that down. Did 3 stitches.  We removed the bougie.  It was intact.   I measured it and it was 2 cm fundoplication in length.   The wrap was soft and floppy.  I placed a drain as noted above.  I did irrigation and ensured hemostasis.  I saw no evidence of any leak or perforation or other abnormality.  I removed the [facility] liver retractor under direct visualization.  I evacuated carbon dioxide and removed the ports.  The skin was closed with Monocryl and sterile dressings applied.  The patient is being extubated and brought back to the recovery room.  I discussed postop care in detail with the patient and family in in the office.  Discussed again with the patient in the holding area.  I am about to discuss it with her family as well.

## 2012-04-08 NOTE — H&P (View-Only) (Signed)
Subjective:     Patient ID: Nicole Meyer, female   DOB: 03/27/1940, 71 y.o.   MRN: [id number]  HPI   Nicole Meyer  04/06/1940 [id number]  Patient Care Team: Robert R Ehinger, MD as PCP - General (Family Medicine) John C Hayes, MD (Gastroenterology) Huan T Ha, MD (Hematology and Oncology) Robert D Kaplan, MD as Consulting Physician (Gastroenterology)  This patient is a 71 y.o.female who presents today for surgical evaluation at the request of Dr. Kaplan.   Reason for evaluation: Large paraesophageal hiatal hernia.  Worsening symptoms.  Consideration of surgical repair.  Pleasant obese female with chronic reflux.  Has been on proton pump inhibitors for several years.  Get significant breakthrough reflux and has to use TUMS "all the time".  Has had worsening dysphagia where food will stick.  Choking episodes.  A few times a week she will spit out.  A couple episodes of sore severe chest pain as well.  She had one severe episode that became so bad she passed out.  She was admitted.  Cardiac etiology ruled out.  Pulmonary as well.  Was found to have a large paraesophageal hiatal hernia.  Endoscopy and upper GI confirmed this.  Stabilized.  Given no other etiology, concern was that the paraesophageal hiatal hernia was source of her symptoms and problems.  Therefore we were consulted.  On the day of consult, she had markedly improved and was going home.  Was set up for outpatient followup.  Manometry was set up, however, patient had discomfort and could not tolerate the procedure well.  She claims it was able to be partially done.  Official report saying it could not be started.  Patient has a history of anemia.  Appears to be iron deficient.  Followed by Dr. Ha with Hematology.  There is a report of a colonoscopy by [facility], Dr. John Hayes.  Mentioned as negative, but I cannot find the report  Patient can only walk about 10 minutes before she has to stop secondary to shortness of  breath and discomfort.  Normally has a bowel movement every day.  Had a hysterectomy when she was 72 years old.  40-pack-year history of smoking with COPD. - quit 10 years ago.   Not oxygen dependent.  Had EKG and echocardiogram in the hospital did not show any major abnormalities.  Patient walks 5-10 minutes before DOE.  No exertional chest/neck/shoulder/arm pain. No personal nor family history of GI/colon cancer, inflammatory bowel disease, irritable bowel syndrome, allergy such as Celiac Sprue, dietary/dairy problems, colitis, ulcers nor gastritis.  No recent sick contacts/gastroenteritis.  No travel outside the country.  No changes in diet.  Patient notes a paramedian periumbilical pain for the past month.  Worse with coughing and activity.  Tried a Lidoderm pain patch without much help.  Occasionally uses oxycodone.  Seems annoyed/frustrated by it.  Reflux symptoms have not changed much since I saw her in November.  Patient Active Problem List  Diagnosis  . Anemia  . Leukocytopenia  . HTN (hypertension)  . Asthma  . IBS (irritable bowel syndrome)  . Arthritis  . Anxiety  . Chronic kidney disease (CKD), stage III (moderate)  . Hypercalcemia  . Fatigue  . Lymphopenia  . Syncope and collapse  . Acute epigastric pain  . COPD (chronic obstructive pulmonary disease)  . Chest pain, noncardiac - probably due to giant hiatal hernia  . Incarcerated paraesophageal hernia  . UTI (lower urinary tract infection)  . Esophageal dysphagia from   hiatal hernia with occasional vomiting  . Obesity (BMI 30-39.9)  . Umbilical hernia    Past Medical History  Diagnosis Date  . Leukocytopenia   . HTN (hypertension)   . Asthma   . IBS (irritable bowel syndrome)   . Anxiety   . Depression   . Chronic kidney disease (CKD), stage III (moderate)   . Hypercalcemia   . Fatigue   . Lymphopenia 08/23/2011  . COPD (chronic obstructive pulmonary disease)   . Renal insufficiency   . UTI (urinary tract  infection) 12/26/2011  . Syncope and collapse 12/24/2011    "loss of consciousness for 3-4 min" (12/26/2011)  . Hypercholesteremia     "above borderline; I don't take RX for it" (12/26/2011)  . Iron deficiency anemia   . H/O hiatal hernia   . GERD (gastroesophageal reflux disease)   . Arthritis     "knees and back" (12/26/2011)  . Osteoporosis     Past Surgical History  Procedure Laterality Date  . Hernia repair  1970's    "stomach" (12/26/2011)  . Abdominal hysterectomy  ~ 1980  . Esophagogastroduodenoscopy  12/26/2011    Procedure: ESOPHAGOGASTRODUODENOSCOPY (EGD);  Surgeon: Robert D Kaplan, MD;  Location: [facility];  Service: Endoscopy;  Laterality: N/A;    History   Social History  . Marital Status: Married    Spouse Name: N/A    Number of Children: 2  . Years of Education: N/A   Occupational History  .      retired school cafeteria worker   Social History Main Topics  . Smoking status: Former Smoker -- 1.00 packs/day for 40 years    Types: Cigarettes    Quit date: 02/19/1999  . Smokeless tobacco: Never Used  . Alcohol Use: No  . Drug Use: No  . Sexually Active: No   Other Topics Concern  . Not on file   Social History Narrative  . No narrative on file    Family History  Problem Relation Age of Onset  . Diabetes Father   . Heart disease Father   . Cancer Maternal Aunt     Breast cancer    Current Outpatient Prescriptions  Medication Sig Dispense Refill  . albuterol (PROVENTIL) (2.5 MG/3ML) 0.083% nebulizer solution Take 2.5 mg by nebulization daily. Wheezing and shortness of breath      . aspirin 81 MG tablet Take 81 mg by mouth daily.      . beta carotene w/minerals (OCUVITE) tablet Take 1 tablet by mouth daily.      . Calcium Carb-Cholecalciferol (CALCIUM + D3) 600-200 MG-UNIT TABS Take 1 tablet by mouth daily.      . lisinopril-hydrochlorothiazide (PRINZIDE,ZESTORETIC) 20-12.5 MG per tablet Take 1 tablet by mouth every morning.       . Multiple  Vitamin (MULTIVITAMIN) tablet Take 1 tablet by mouth daily.      . omeprazole (PRILOSEC) 20 MG capsule Take 20 mg by mouth daily.      . oxyCODONE-acetaminophen (PERCOCET/ROXICET) 5-325 MG per tablet Take 1 tablet by mouth every 6 (six) hours as needed for pain.        No current facility-administered medications for this visit.     Allergies  Allergen Reactions  . Penicillins Hives  . Brovana (Arformoterol) Other (See Comments)    "makes me nervous"  . Paxil (Paroxetine Hcl) Rash    BP 162/76  Pulse 84  Temp(Src) 97.4 F (36.3 C) (Temporal)  Resp 16  Ht 5' 3" (1.6 m)  Wt   185 lb 3.2 oz (84.006 kg)  BMI 32.81 kg/m2  Dg Chest 2 View  12/24/2011  *RADIOLOGY REPORT*  Clinical Data: Loss of consciousness  CHEST - 2 VIEW  Comparison: 04/04/2008  Findings:  Unchanged cardiac silhouette and mediastinal contours.  Unchanged retrocardiac air and fluid-filled opacity compatible with a hiatal hernia.  There is mild diffuse thickening of the pulmonary interstitium.  No focal airspace opacities.  No definite pleural effusion or pneumothorax.  Unchanged bones.  IMPRESSION:  1.  No acute cardiopulmonary disease.  2.  Hiatal hernia.   Original Report Authenticated By: John Watts V, MD    Ct Abdomen Pelvis W Contrast  12/24/2011  *RADIOLOGY REPORT*  Clinical Data: Severe mid epigastric abdominal pain and nausea, after which the patient became syncopal.  CT ABDOMEN AND PELVIS WITH CONTRAST  Technique:  Multidetector CT imaging of the abdomen and pelvis was performed following the standard protocol during bolus administration of intravenous contrast.  Contrast: 80mL OMNIPAQUE IOHEXOL 300 MG/ML. Oral contrast was also administered.  Comparison: None.  Findings: Moderate sized hiatal hernia, with approximately one third of the stomach present within the chest.  Marked thickening of the wall of the visualized distal esophagus, though the wall thickening is asymmetric and increased along the right lateral wall.   Stomach otherwise normal in appearance.  Mild wall thickening involving the terminal ileum, with fat in the wall. Remaining small bowel normal in appearance.  Scattered diverticula involving the sigmoid colon without evidence of acute diverticulitis; colon otherwise normal in appearance.  Lipoma involving the ileocecal valve.  Appendix not identified, but no pericecal inflammation. No ascites.  Normal appearing liver, spleen, pancreas, adrenal glands, and kidneys.  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  No significant lymphadenopathy.  Gallbladder unremarkable by CT.  No biliary ductal dilation.  Uterus surgically absent.  No adnexal masses or free pelvic fluid. Very small diverticulum arising from the left lateral wall of the bladder at its base.  Bone window images demonstrate degenerative changes involving the lower lumbar spine.  Visualized lung bases clear.  IMPRESSION:  1.  Moderate sized hiatal hernia.  Asymmetric wall thickening involving the visualized distal esophagus; while this may be due to esophagitis and chronic GE reflux disease, the asymmetry places neoplasm in the differential diagnosis.  Upper endoscopy is suggested in further evaluation. 2.  No acute abnormalities involving the abdomen or pelvis. 3.  Wall thickening involving the terminal ileum, with fat in the wall suggesting chronic inflammation.  No evidence of acute inflammatory changes. 4.  Very small diverticulum arising from the left lateral wall of the urinary bladder at its base. 5.  Scattered sigmoid colon diverticula without evidence of acute diverticulitis.   Original Report Authenticated By: Thomas Lawrence, [name]    Dg Ugi W/high Density W/kub  12/27/2011  *RADIOLOGY REPORT*  Clinical Data:  Abdominal pain and vomiting.  Evaluate for possible gastric volvulus.  UPPER GI SERIES WITH KUB  Technique:  Routine upper GI series was performed with high density barium  Fluoroscopy Time: 4.12 minutes  Comparison:  CT  of the abdomen and pelvis 12/24/2011.  Findings: Initial KUB demonstrated gas, stool and residual oral contrast material scattered throughout the colon.  No pathologic distension of small bowel was noted.  Esophagram demonstrated a nonspecific esophageal motility disorder with strong tertiary contractions.  Moderate - large hiatal hernia (sliding-type).  No evidence of gastric volvulus. No evidence of obstructing esophageal mass, stricture or significant esophageal ring was noted.  Images of the stomach demonstrated   no gastric mass or definite ulcer.  The duodenal bulb was normal in appearance.  IMPRESSION: 1.  Moderate - large sliding type hiatal hernia. 2.  No evidence of gastric volvulus. 3.  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   Original Report Authenticated By: Daniel Entrikin, [name]      Review of Systems  Constitutional: Negative for fever, chills, diaphoresis, appetite change and fatigue.  HENT: Negative for ear pain, sore throat, trouble swallowing, neck pain and ear discharge.   Eyes: Negative for photophobia, discharge and visual disturbance.  Respiratory: Negative for cough, choking, chest tightness and shortness of breath.   Cardiovascular: Negative for chest pain and palpitations.  Gastrointestinal: Negative for nausea, vomiting, abdominal pain, diarrhea, constipation, anal bleeding and rectal pain.  Genitourinary: Negative for dysuria, frequency and difficulty urinating.  Musculoskeletal: Negative for myalgias and gait problem.  Skin: Negative for color change, pallor and rash.  Neurological: Negative for dizziness, speech difficulty, weakness and numbness.  Hematological: Negative for adenopathy.  Psychiatric/Behavioral: Negative for confusion and agitation. The patient is not nervous/anxious.        Objective:   Physical Exam  Constitutional: She is oriented to person, place, and time. She appears well-developed and well-nourished. No  distress.  HENT:  Head: Normocephalic.  Mouth/Throat: Oropharynx is clear and moist. No oropharyngeal exudate.  Eyes: Conjunctivae and EOM are normal. Pupils are equal, round, and reactive to light. No scleral icterus.  Neck: Normal range of motion. Neck supple. No tracheal deviation present.  Cardiovascular: Normal rate, regular rhythm and intact distal pulses.   Pulmonary/Chest: Effort normal and breath sounds normal. No respiratory distress. She exhibits no tenderness.  Abdominal: Soft. She exhibits no distension and no mass. There is tenderness in the periumbilical area. There is no rigidity and no guarding. A hernia is present. Hernia confirmed positive in the ventral area. Hernia confirmed negative in the right inguinal area and confirmed negative in the left inguinal area.    Genitourinary: No vaginal discharge found.  Musculoskeletal: Normal range of motion. She exhibits no tenderness.  Lymphadenopathy:    She has no cervical adenopathy.       Right: No inguinal adenopathy present.       Left: No inguinal adenopathy present.  Neurological: She is alert and oriented to person, place, and time. No cranial nerve deficit. She exhibits normal muscle tone. Coordination normal.  Skin: Skin is warm and dry. No rash noted. She is not diaphoretic. No erythema.  Psychiatric: She has a normal mood and affect. Her behavior is normal. Judgment and thought content normal.       Assessment:     Large paraesophageal hiatal hernia.  Recurrent episodes of dysphasia and reflux.  Episodes of severe chest pain as well.  Anemia as well.  Painful umbilical hernia.  Small reducible.    Plan:     She has numerous indications for surgical repair.  I suspect her iron deficiency anemia is related to Cameron ulcerations related to her paraesophageal hiatal hernia in the absence of any other source.  Would like to get colonoscopy report from [facility] in order to be thorough.  She is starting on  heartburn/reflux control with medicine and having dwindling tolerance.  I think she would benefit from surgery With a hiatal hernia repair.  Probably try and do a short fundoplication as well given the fact that I could not get accurate preoperative manometry.   I discussed this in detail with she and her husband:    The anatomy & physiology of the foregut and anti-reflux mechanism was discussed.  The pathophysiology of hiatal herniation and GERD was discussed.  Natural history risks without surgery was discussed.   The patient's symptoms are not adequately controlled by medicines and other non-operative treatments.  I feel the risks of no intervention will lead to serious problems that outweigh the operative risks; therefore, I recommended surgery to reduce the hiatal hernia out of the chest and fundoplication to rebuild the anti-reflux valve and control reflux better.  Need for a thorough workup to rule out the differential diagnosis and plan treatment was explained.  I explained laparoscopic techniques with possible need for an open approach.  Risks such as bleeding, infection, abscess, leak, need for further treatment, heart attack, death, and other risks were discussed.   I noted a good likelihood this will help address the problem.  Goals of post-operative recovery were discussed as well.  Possibility that this will not correct all symptoms was explained.  Post-operative dysphagia, need for short-term liquid & pureed diet, inability to vomit, possibility of reherniation, possible need for medicines to help control symptoms in addition to surgery were discussed.  We will work to minimize complications.   Educational handouts further explaining the pathology, treatment options, and dysphagia diet was given as well.  Questions were answered.  The patient expresses understanding & wishes to proceed with surgery.  Given her obesity, she is at high risk for recurrence.  The trend is to go away from mesh  reinforcement, but we will see.  Hopefully we will not need to do a Collis gastroplasty esophageal lengthening procedure.  But we will see...  Cardiac clearance done.  I recommend she get the umbilical hernia repaired at the same time.  This allows me to do diagnostic laparoscopy to make sure there are no other surprises for her periumbilical abdominal pain.  She her husband agree.  Hopefully the small left edge of suture repair as needed, but her obesity makes him wonder if she will need a mesh patch repair as well.  We will see.  The anatomy & physiology of the abdominal wall and pelvic floor was discussed.  The pathophysiology of hernias in the inguinal and pelvic region was discussed.  Natural history risks such as progressive enlargement, pain, incarceration & strangulation was discussed.   Contributors to complications such as smoking, obesity, diabetes, prior surgery, etc were discussed.    I feel the risks of no intervention will lead to serious problems that outweigh the operative risks; therefore, I recommended surgery to reduce and repair the hernia.  I explained laparoscopic techniques with possible need for an open approach.  I noted usual use of mesh to patch and/or buttress hernia repair  Risks such as bleeding, infection, abscess, need for further treatment, heart attack, death, and other risks were discussed.  I noted a good likelihood this will help address the problem.   Goals of post-operative recovery were discussed as well.  Possibility that this will not correct all symptoms was explained.  I stressed the importance of low-impact activity, aggressive pain control, avoiding constipation, & not pushing through pain to minimize risk of post-operative chronic pain or injury. Possibility of reherniation was discussed.  We will work to minimize complications.     An educational handout further explaining the pathology & treatment options was given as well.  Questions were answered.  The  patient expresses understanding & wishes to proceed with surgery.        

## 2012-04-08 NOTE — Transfer of Care (Signed)
Immediate Anesthesia Transfer of Care Note  Patient: Nicole Meyer  Procedure(s) Performed: Procedure(s) with comments: Laparoscopic Reduction and Repair of Paraesophagel Hiatal Hernia with Nissen (N/A) - Laparoscopic Reduction and Repair of Paraesophagel Hiatal Hernia with Nissen  Patient Location: PACU  Anesthesia Type:General  Level of Consciousness: awake, oriented and patient cooperative  Airway & Oxygen Therapy: Patient Spontanous Breathing and Patient connected to face mask oxygen  Post-op Assessment: Report given to PACU RN, Post -op Vital signs reviewed and stable and Patient moving all extremities  Post vital signs: Reviewed and stable  Complications: No apparent anesthesia complications

## 2012-04-08 NOTE — Progress Notes (Signed)
Dr. Michaell Cowing notified regarding patient report of chest pain 9/10 in right chest feeling as though "something is sitting on it".  New orders received and initiated.  Francene Finders, RN

## 2012-04-08 NOTE — Anesthesia Postprocedure Evaluation (Signed)
  Anesthesia Post-op Note  Patient: Nicole Meyer  Procedure(s) Performed: Procedure(s) (LRB): Laparoscopic Reduction and Repair of Paraesophagel Hiatal Hernia with Nissen (N/A)  Patient Location: PACU  Anesthesia Type: General  Level of Consciousness: awake and alert   Airway and Oxygen Therapy: Patient Spontanous Breathing  Post-op Pain: mild  Post-op Assessment: Post-op Vital signs reviewed, Patient's Cardiovascular Status Stable, Respiratory Function Stable, Patent Airway and No signs of Nausea or vomiting  Last Vitals:  Filed Vitals:   04/08/12 1607  BP: 150/84  Pulse: 94  Temp:   Resp:     Post-op Vital Signs: stable   Complications: No apparent anesthesia complications

## 2012-04-08 NOTE — Discharge Summary (Signed)
Physician Discharge Summary  Patient ID: Nicole Meyer MRN: [id number] DOB/AGE: [birth year] 72 y.o.  Admit date: 04/08/2012 Discharge date: 04/10/2012  Admission Diagnoses: Principal Problem:   Incarcerated paraesophageal hernia Active Problems:   IBS (irritable bowel syndrome)   Chronic kidney disease (CKD), stage III (moderate)   COPD (chronic obstructive pulmonary disease)   Chest pain, noncardiac - probably due to giant hiatal hernia   Esophageal dysphagia from hiatal hernia with occasional vomiting   Obesity (BMI 30-39.9)  Discharge Diagnoses:  Principal Problem:   Incarcerated paraesophageal hernia Active Problems:   IBS (irritable bowel syndrome)   Chronic kidney disease (CKD), stage III (moderate)   COPD (chronic obstructive pulmonary disease)   Chest pain, noncardiac - probably due to giant hiatal hernia   Esophageal dysphagia from hiatal hernia with occasional vomiting   Obesity (BMI 30-39.9)   Discharged Condition: good  Hospital Course:   Pt underwent surgery.  Chest pain 1st night resolved - no cardiac etiology.  Postoperatively, the patient had UGI study showing no leak/obstruction at fundoplication.   The patient mobilized and advanced to a pureed diet gradually.  Pain was well-controlled and transitioned off IV medications.    By the time of discharge, the patient was walking well the hallways, eating food well, having flatus.  Pain was-controlled on an oral regimen.  Based on meeting DC criteria and recovering well, I felt it was safe for the patient to be discharged home with close followup.  Instructions were discussed in detail.  They are written as well.     Consults: None  Significant Diagnostic Studies:   Treatments: surgery: PROCEDURE PERFORMED:  1. Laparoscopic reduction and primary repair of paraesophageal hiatal  Hernia over pledgets.  2. Nissen fundoplication 2cm over a 56-French bougie.  3. Anterior & posterior gastropexy  4. Type 2  mediastinal dissection.      Discharge Exam: Blood pressure 154/67, pulse 95, temperature 98.3 F (36.8 C), resp. rate 20, SpO2 96.00%.  General: Pt awake/alert/oriented x4 in no major acute distress Eyes: PERRL, normal EOM. Sclera nonicteric Neuro: CN II-XII intact w/o focal sensory/motor deficits. Lymph: No head/neck/groin lymphadenopathy Psych:  No delerium/psychosis/paranoia.  Smiling, alert/energetic HENT: Normocephalic, Mucus membranes moist.  No thrush Neck: Supple, No tracheal deviation Chest: No pain.  Good respiratory excursion. CV:  Pulses intact.  Regular rhythm MS: Normal AROM mjr joints.  No obvious deformity Abdomen: Soft, Nondistended.  Min tender at dressings.  No incarcerated hernias.  Drain serosanguinous & minimal Ext:  SCDs BLE.  No significant edema.  No cyanosis Skin: No petechiae / purpurae   Disposition: 01-Home or Self Care   Future Appointments Provider Department Dept Phone   04/23/2012 1:45 PM Mauri [place] [facility] MEDICAL ONCOLOGY [phone]   07/06/2012 10:15 AM Wind[facility] MEDICAL ONCOLOGY [phone]   07/06/2012 10:45 AM Myrtis Ser, NP [facility] MEDICAL ONCOLOGY [phone]       Medication List    ASK your doctor about these medications       albuterol (2.5 MG/3ML) 0.083% nebulizer solution  Commonly known as:  PROVENTIL  Take 2.5 mg by nebulization daily. Wheezing and shortness of breath     aspirin 81 MG tablet  Take 81 mg by mouth daily.     beta carotene w/minerals tablet  Take 1 tablet by mouth daily.     Calcium + D3 600-200 MG-UNIT Tabs  Take 1 tablet by mouth daily.  lisinopril-hydrochlorothiazide 20-12.5 MG per tablet  Commonly known as:  PRINZIDE,ZESTORETIC  Take 1 tablet by mouth every morning.     multivitamin tablet  Take 1 tablet by mouth daily.     omeprazole 20 MG capsule  Commonly known as:  PRILOSEC  Take 20 mg by mouth daily.      oxyCODONE-acetaminophen 5-325 MG per tablet  Commonly known as:  PERCOCET/ROXICET  Take 1 tablet by mouth every 6 (six) hours as needed for pain.           Follow-up Information   Follow up with Eissa Buchberger C., MD. Schedule an appointment as soon as possible for a visit in 2 weeks.   Contact information:   [address] [place] [address] [phone]       Signed: Ardeth Sportsman. 04/08/2012, 8:37 AM

## 2012-04-09 ENCOUNTER — Observation Stay (HOSPITAL_COMMUNITY): Payer: Medicare PPO

## 2012-04-09 ENCOUNTER — Encounter (HOSPITAL_COMMUNITY): Payer: Self-pay | Admitting: Surgery

## 2012-04-09 LAB — BASIC METABOLIC PANEL
Calcium: 9 mg/dL (ref 8.4–10.5)
Chloride: 101 mEq/L (ref 96–112)
Creatinine, Ser: 0.94 mg/dL (ref 0.50–1.10)
GFR calc Af Amer: 69 mL/min — ABNORMAL LOW (ref >90)
Sodium: 134 mEq/L — ABNORMAL LOW (ref 135–145)

## 2012-04-09 LAB — CBC
MCV: 99.3 fL (ref 78.0–100.0)
Platelets: 199 10*3/uL (ref 150–400)
RDW: 13.7 % (ref 11.5–15.5)
WBC: 6.5 10*3/uL (ref 4.0–10.5)

## 2012-04-09 MED ORDER — FUROSEMIDE 10 MG/ML IJ SOLN
40.0000 mg | Freq: Once | INTRAMUSCULAR | Status: AC
  Administered 2012-04-09: 40 mg via INTRAVENOUS
  Filled 2012-04-09: qty 4

## 2012-04-09 MED ORDER — DEXAMETHASONE SODIUM PHOSPHATE 10 MG/ML IJ SOLN
10.0000 mg | Freq: Every day | INTRAMUSCULAR | Status: AC
  Administered 2012-04-09 – 2012-04-11 (×3): 10 mg via INTRAVENOUS
  Filled 2012-04-09 (×3): qty 1

## 2012-04-09 MED ORDER — SODIUM CHLORIDE 0.9 % IJ SOLN
3.0000 mL | INTRAMUSCULAR | Status: DC | PRN
  Administered 2012-04-10 (×2): 10 mL via INTRAVENOUS
  Administered 2012-04-11: 3 mL via INTRAVENOUS

## 2012-04-09 MED ORDER — LACTATED RINGERS IV BOLUS (SEPSIS): 1000.0000 mL | Freq: Three times a day (TID) | INTRAVENOUS | Status: AC | PRN

## 2012-04-09 MED ORDER — SODIUM CHLORIDE 0.9 % IJ SOLN
3.0000 mL | Freq: Two times a day (BID) | INTRAMUSCULAR | Status: DC
  Administered 2012-04-09 – 2012-04-11 (×5): 3 mL via INTRAVENOUS

## 2012-04-09 MED ORDER — FENTANYL CITRATE 0.05 MG/ML IJ SOLN: 25.0000 ug | INTRAMUSCULAR | Status: DC | PRN

## 2012-04-09 MED ORDER — IOHEXOL 300 MG/ML  SOLN
50.0000 mL | Freq: Once | INTRAMUSCULAR | Status: AC | PRN
  Administered 2012-04-09: 35 mL via ORAL

## 2012-04-09 NOTE — Progress Notes (Signed)
Nicole Meyer [id number] 26-Apr-1940   Subjective:  Sore Not walking much Tol liquids fine Chest pain yest PM - EKG/CXR neg.  Much better now -sleepy w IV dilaudid  Objective:  Vital signs:  Filed Vitals:   04/08/12 1648 04/08/12 1801 04/08/12 2201 04/09/12 0641  BP: 115/73 125/71 134/77 120/77  Pulse: 105 70 76 78  Temp:  97.1 F (36.2 C) 98.3 F (36.8 C) 98.3 F (36.8 C)  TempSrc:  Oral Oral Oral  Resp:  16 18 18   Height:      Weight:      SpO2: 95% 99% 100% 96%       Intake/Output   Yesterday:  02/19 0701 - 02/20 0700 In: 3435 [I.V.:3435] Out: 920 [Urine:820; Drains:80; Blood:20] This shift:  Total I/O In: 581.7 [I.V.:581.7] Out: 470 [Urine:400; Drains:70]  Bowel function:  Flatus: y  BM: n Drain - scant serosanguinous fluid  Physical Exam:  General: Pt awake/alert/oriented x4 in no acute distress Eyes: PERRL, normal EOM.  Sclera clear.  No icterus Neuro: CN II-XII intact w/o focal sensory/motor deficits. Lymph: No head/neck/groin lymphadenopathy Psych:  No delerium/psychosis/paranoia HENT: Normocephalic, Mucus membranes moist.  No thrush Neck: Supple, No tracheal deviation Chest: Dec BS bilat.  No chest wall pain w good excursion CV:  Pulses intact.  Regular rhythm MS: Normal AROM mjr joints.  No obvious deformity Abdomen: Soft.  Nondistended.  Mildly tender at incisions only.  No incarcerated hernias. Ext:  SCDs BLE.  No mjr edema.  No cyanosis Skin: No petechiae / purpurae  Problem List:  Principal Problem:   Incarcerated paraesophageal hernia Active Problems:   IBS (irritable bowel syndrome)   Chronic kidney disease (CKD), stage III (moderate)   COPD (chronic obstructive pulmonary disease)   Chest pain, noncardiac - probably due to giant hiatal hernia   Esophageal dysphagia from hiatal hernia with occasional vomiting   Obesity (BMI 30-39.9)  UGI no mjor leak/obstruction to my view.  Esophagus tortuous   Assessment  Nicole Meyer  72 y.o. female  1 Day Post-Op  Procedure(s): Laparoscopic Reduction and Repair of Paraesophagel Hiatal Hernia with Nissen  Stable  Plan:  -adv pureed diet -change to fentanyl PT/OT evals ?HH vs SNF? -wean oxygen -VTE prophylaxis- SCDs, etc -mobilize as tolerated to help recovery  Ardeth Sportsman, [name], F.A.C.S. Gastrointestinal and Minimally Invasive Surgery [facility] [address], [address] [place], [place] [address] [phone] Main / Paging [phone] Voice Mail   04/09/2012  CARE TEAM:  PCP: Thora Lance, MD  Outpatient Care Team: Patient Care Team: Thora Lance, MD as PCP - General (Family Medicine) Barrie Folk, MD (Gastroenterology) Exie Parody, MD (Hematology and Oncology) Louis Meckel, MD as Consulting Physician (Gastroenterology)  Inpatient Treatment Team: Treatment Team: Attending Provider: Ardeth Sportsman, MD; Registered Nurse: Romeo Rabon, RN   Results:   Labs: Results for orders placed during the hospital encounter of 04/08/12 (from the past 48 hour(s))  CBC     Status: Abnormal   Collection Time    04/08/12  3:35 PM      Result Value Range   WBC 5.4  4.0 - 10.5 K/uL   RBC 2.89 (*) 3.87 - 5.11 MIL/uL   Hemoglobin 9.7 (*) 12.0 - 15.0 g/dL   HCT 47.4 (*) 25.9 - 56.3 %   MCV 99.7  78.0 - 100.0 fL   MCH 33.6  26.0 - 34.0 pg   MCHC 33.7  30.0 - 36.0 g/dL   RDW 96.2  95.2 - 84.1 %   Platelets 182  150 - 400 K/uL  CREATININE, SERUM     Status: Abnormal   Collection Time    04/08/12  3:35 PM      Result Value Range   Creatinine, Ser 0.95  0.50 - 1.10 mg/dL   GFR calc non Af Amer 58 (*) >90 mL/min   GFR calc Af Amer 68 (*) >90 mL/min   Comment:            The eGFR has been calculated     using the CKD EPI equation.     This calculation has not been     validated in all clinical     situations.     eGFR's persistently     <90 mL/min signify     possible Chronic Kidney Disease.  CBC      Status: Abnormal   Collection Time    04/09/12  4:05 AM      Result Value Range   WBC 6.5  4.0 - 10.5 K/uL   RBC 2.77 (*) 3.87 - 5.11 MIL/uL   Hemoglobin 9.4 (*) 12.0 - 15.0 g/dL   HCT 32.4 (*) 40.1 - 02.7 %   MCV 99.3  78.0 - 100.0 fL   MCH 33.9  26.0 - 34.0 pg   MCHC 34.2  30.0 - 36.0 g/dL   RDW 25.3  66.4 - 40.3 %   Platelets 199  150 - 400 K/uL  BASIC METABOLIC PANEL     Status: Abnormal   Collection Time    04/09/12  4:05 AM      Result Value Range   Sodium 134 (*) 135 - 145 mEq/L   Potassium 5.1  3.5 - 5.1 mEq/L   Chloride 101  96 - 112 mEq/L   CO2 24  19 - 32 mEq/L   Glucose, Bld 109 (*) 70 - 99 mg/dL   BUN 17  6 - 23 mg/dL   Creatinine, Ser 4.74  0.50 - 1.10 mg/dL   Calcium 9.0  8.4 - 25.9 mg/dL   GFR calc non Af Amer 59 (*) >90 mL/min   GFR calc Af Amer 69 (*) >90 mL/min   Comment:            The eGFR has been calculated     using the CKD EPI equation.     This calculation has not been     validated in all clinical     situations.     eGFR's persistently     <90 mL/min signify     possible Chronic Kidney Disease.    Imaging / Studies: Dg Chest Port 1 View  04/08/2012  *RADIOLOGY REPORT*  Clinical Data: Upper right chest pain and shortness of breath. Para esophageal hernia repair earlier today.  PORTABLE CHEST - 1 VIEW  Comparison: 12/24/2011  Findings: At least one posterolateral left rib fracture, nonacute. A drain projects over the left-sided chest is likely within the mediastinum.  Normal heart size for level of inspiration.  Small left pleural effusion versus thickening, new. No pneumothorax.  Low lung volumes.  Patchy left base air space disease.  IMPRESSION: New pleural parenchymal opacity on the left, likely postoperative.  No pneumothorax or convincing evidence of pneumomediastinum.   Original Report Authenticated By: Jeronimo Greaves, [name]     Medications / Allergies: per chart  Antibiotics: Anti-infectives   Start     Dose/Rate Route Frequency Ordered  Stop   04/08/12 1830  clindamycin (CLEOCIN) IVPB 900 mg     900 mg 100 mL/hr over 30 Minutes Intravenous 4 times per day 04/08/12 1800 04/09/12 0559   04/08/12 0645  clindamycin (CLEOCIN) IVPB 900 mg     900 mg 100 mL/hr over 30 Minutes Intravenous On call to O.R. 04/08/12 1610 04/08/12 0810   04/08/12 0600  gentamicin (GARAMYCIN) 320 mg in dextrose 5 % 50 mL IVPB    Comments:  Pharmacy may adjust dosing strength, schedule, rate of infusion, etc as needed to optimize therapy   320 mg 116 mL/hr over 30 Minutes Intravenous On call to O.R. 04/07/12 1700 04/08/12 0810

## 2012-04-09 NOTE — Progress Notes (Signed)
INITIAL NUTRITION ASSESSMENT  Pt meets criteria for moderate MALNUTRITION in the context of chronic illness as evidenced by <75% estimated energy intake in the past month with 7.5% weight loss in the past 3 months per pt report.  DOCUMENTATION CODES Per approved criteria  -Non-severe (moderate) malnutrition in the context of chronic illness -Obesity Unspecified   INTERVENTION: - Provided education on post-op diet for nissen fundoplication. Teach back method used. Emphasized the importance of pureed foods for the next 1-2 weeks and discussed how to puree foods and sample meal plans. Handouts and RD contact information provided.  - Will continue to monitor   NUTRITION DIAGNOSIS: Increased nutrient needs related to recent GI surgery as evidenced by MD notes.   Goal: Pt to consume >90% of meals  Monitor:  Weights, labs, intake   Reason for Assessment: Consult  72 y.o. female  Admitting Dx: Incarcerated paraesophageal hernia  ASSESSMENT: Pleasant obese female with chronic reflux. Has been on proton pump inhibitors for several years. Gets significant breakthrough reflux and has to use TUMS "all the time". Has had worsening dysphagia where food will stick. Choking episodes. A few times a week she will spit out. A couple episodes of sore severe chest pain as well. She had one severe episode that became so bad she passed out.   Met with pt who reports 15 pound unintended weight loss in the past 3 months due to dysphagia (7.5% weight loss). POD# 1 laparoscopic reduction and repair of paraesophageal hiatal hernia with nissen fundoplication. Pt on clear liquid diet yesterday which she tolerated well without pain or nausea.    Height: Ht Readings from Last 1 Encounters:  04/08/12 5\' 3"  (1.6 m)    Weight: Wt Readings from Last 1 Encounters:  04/08/12 185 lb 8 oz (84.142 kg)    Ideal Body Weight: 115 lb  % Ideal Body Weight: 161  Wt Readings from Last 10 Encounters:  04/08/12 185 lb  8 oz (84.142 kg)  04/08/12 185 lb 8 oz (84.142 kg)  04/06/12 185 lb 8 oz (84.142 kg)  03/30/12 185 lb 3.2 oz (84.006 kg)  01/09/12 191 lb (86.637 kg)  01/08/12 193 lb 12.8 oz (87.907 kg)  01/06/12 192 lb (87.091 kg)  01/06/12 192 lb (87.091 kg)  12/27/11 192 lb 14.4 oz (87.499 kg)  12/27/11 192 lb 14.4 oz (87.499 kg)    Usual Body Weight: 200 lb per pt report  % Usual Body Weight: 93  BMI:  Body mass index is 32.87 kg/(m^2). Class I obesity  Estimated Nutritional Needs: Kcal: 1600-1700 Protein: 65-75g Fluid: 1.6-1.7L/day  Skin: Surgical abdominal incision   Diet Order: Dysphagia 1   EDUCATION NEEDS: -Education needs addressed - discussed post-op diet for nissen fundoplication    Intake/Output Summary (Last 24 hours) at 04/09/12 1313 Last data filed at 04/09/12 1220  Gross per 24 hour  Intake 1006.33 ml  Output    800 ml  Net 206.33 ml    Last BM: 2/20  Labs:   Recent Labs Lab 04/06/12 1200 04/08/12 1535 04/09/12 0405  NA 137  --  134*  K 4.3  --  5.1  CL 103  --  101  CO2 24  --  24  BUN 16  --  17  CREATININE 0.97 0.95 0.94  CALCIUM 9.7  --  9.0  GLUCOSE 102*  --  109*    CBG (last 3)  No results found for this basename: GLUCAP,  in the last 72 hours  Scheduled  Meds: . acetaminophen  1,000 mg Oral TID  . aspirin EC  81 mg Oral Daily  . chlorhexidine  1 application Topical Once  . chlorhexidine  1 application Topical Once  . dexamethasone  10 mg Intravenous Daily  . heparin  5,000 Units Subcutaneous Q8H  . lisinopril  10 mg Oral Daily   And  . hydrochlorothiazide  12.5 mg Oral Daily  . lip balm  1 application Topical BID  . multivitamin with minerals  1 tablet Oral Daily  . pantoprazole  40 mg Oral Daily  . psyllium  1 packet Oral BID  . saccharomyces boulardii  250 mg Oral BID  . sodium chloride  3 mL Intravenous Q12H  . sodium chloride  3 mL Intravenous Q12H    Continuous Infusions:   Past Medical History  Diagnosis Date  .  Leukocytopenia   . HTN (hypertension)   . Asthma   . IBS (irritable bowel syndrome)   . Anxiety   . Depression   . Chronic kidney disease (CKD), stage III (moderate)   . Hypercalcemia   . Fatigue   . Lymphopenia 08/23/2011  . COPD (chronic obstructive pulmonary disease)   . Renal insufficiency   . UTI (urinary tract infection) 12/26/2011  . Syncope and collapse 12/24/2011    "loss of consciousness for 3-4 min" (12/26/2011)  . Hypercholesteremia     "above borderline; I don't take RX for it" (12/26/2011)  . H/O hiatal hernia   . GERD (gastroesophageal reflux disease)   . Arthritis     "knees and back" (12/26/2011)  . Osteoporosis   . 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 Iron deficiency anemia     [facility]    Past Surgical History  Procedure Laterality Date  . Hernia repair  1970's    "stomach" (12/26/2011)  . Abdominal hysterectomy  ~ 1980  . Esophagogastroduodenoscopy  12/26/2011    Procedure: ESOPHAGOGASTRODUODENOSCOPY (EGD);  Surgeon: Louis Meckel, MD;  Location: [facility] ENDOSCOPY;  Service: Endoscopy;  Laterality: N/A;  . Breast surgery  04-06-12    rt. lumpectomy_benign  . Tubal ligation       Levon Hedger MS, RD, LDN [phone] Pager [phone] After Hours Pager

## 2012-04-10 DIAGNOSIS — E440 Moderate protein-calorie malnutrition: Secondary | ICD-10-CM | POA: Diagnosis present

## 2012-04-10 LAB — CBC
HCT: 28.5 % — ABNORMAL LOW (ref 36.0–46.0)
Platelets: 206 10*3/uL (ref 150–400)
RDW: 13.5 % (ref 11.5–15.5)
WBC: 6.3 10*3/uL (ref 4.0–10.5)

## 2012-04-10 LAB — BASIC METABOLIC PANEL
Chloride: 96 mEq/L (ref 96–112)
GFR calc Af Amer: 75 mL/min — ABNORMAL LOW (ref >90)
Potassium: 4.5 mEq/L (ref 3.5–5.1)

## 2012-04-10 MED ORDER — FUROSEMIDE 10 MG/ML IJ SOLN
40.0000 mg | Freq: Once | INTRAMUSCULAR | Status: AC
  Administered 2012-04-10: 40 mg via INTRAVENOUS
  Filled 2012-04-10 (×2): qty 4

## 2012-04-10 NOTE — Progress Notes (Signed)
Patient pleasantly refused rolling walker.

## 2012-04-10 NOTE — Progress Notes (Addendum)
Dr. Abbey Chatters notified pt continuing to c/o significant pain w/ activity, tearful when walking in hall and pt is concerned about being discharged today; discharge held until tomorrow.  RN continuing to monitor.

## 2012-04-11 NOTE — Progress Notes (Signed)
Pt for d/c home today. IV d/c'd. Dressing to abdomen CDI-changed this am. Ambulates on hallway with husband & took med  For pain afterwards.  D/C instructions & RX given with verbalized understanding. Husband at bedsisde to assist with d/c.

## 2012-04-11 NOTE — Progress Notes (Signed)
General Surgery Note  LOS: 3 days  POD -   3 Days Post-Op  Assessment/Plan: 1.   Laparoscopic Reduction and Repair of Paraesophagel Hiatal Hernia with Nissen - S. Gross - 04/08/2012   Incarcerated paraesophageal hernia   Doing well on pureed diet.  Husband in room.  Ready to go home.  Discharge instructions reviewed.  2.  IBS (irritable bowel syndrome)  3.  Chronic kidney disease (CKD), stage III (moderate)  4.  COPD (chronic obstructive pulmonary disease)   5.  Esophageal dysphagia from hiatal hernia with occasional vomiting  6.  Obesity (BMI 30-39.9)  7.  DVT prophylaxis - SQ Heparin  Subjective:  Doing better today.  Ready to go home. Objective:   Filed Vitals:   04/11/12 0625  BP: 156/82  Pulse: 72  Temp: 97.8 F (36.6 C)  Resp: 18     Intake/Output from previous day:  02/21 0701 - 02/22 0700 In: 385 [P.O.:360; IV Piggyback:25] Out: 20 [Drains:20]  Intake/Output this shift:  Total I/O In: 120 [P.O.:120] Out: -    Physical Exam:   General: WN WF Obese who is alert and oriented.    HEENT: Normal. Pupils equal. .   Lungs: Clear.   Abdomen: Soft   Wound: Incisions okay.   Lab Results:    Recent Labs  04/09/12 0405 04/10/12 0400  WBC 6.5 6.3  HGB 9.4* 9.8*  HCT 27.5* 28.5*  PLT 199 206    BMET   Recent Labs  04/09/12 0405 04/10/12 0400  NA 134* 133*  K 5.1 4.5  CL 101 96  CO2 24 28  GLUCOSE 109* 108*  BUN 17 15  CREATININE 0.94 0.87  CALCIUM 9.0 9.8    PT/INR  No results found for this basename: LABPROT, INR,  in the last 72 hours  ABG  No results found for this basename: PHART, PCO2, PO2, HCO3,  in the last 72 hours   Studies/Results:  No results found.   Anti-infectives:   Anti-infectives   Start     Dose/Rate Route Frequency Ordered Stop   04/08/12 1830  clindamycin (CLEOCIN) IVPB 900 mg     900 mg 100 mL/hr over 30 Minutes Intravenous 4 times per day 04/08/12 1800 04/09/12 0559   04/08/12 0645  clindamycin (CLEOCIN) IVPB 900  mg     900 mg 100 mL/hr over 30 Minutes Intravenous On call to O.R. 04/08/12 3220 04/08/12 0810   04/08/12 0600  gentamicin (GARAMYCIN) 320 mg in dextrose 5 % 50 mL IVPB    Comments:  Pharmacy may adjust dosing strength, schedule, rate of infusion, etc as needed to optimize therapy   320 mg 116 mL/hr over 30 Minutes Intravenous On call to O.R. 04/07/12 1700 04/08/12 0810      Ovidio Kin, MD, FACS Pager: [phone],   [facility] Office: [phone] 04/11/2012

## 2012-04-20 ENCOUNTER — Telehealth: Payer: Self-pay | Admitting: Oncology

## 2012-04-20 NOTE — Telephone Encounter (Signed)
pt called pt just got out of the hospital for hernia surgery and could not make todays appt and did not want to r/s

## 2012-04-23 ENCOUNTER — Other Ambulatory Visit: Payer: BC Managed Care – PPO | Admitting: Lab

## 2012-04-29 ENCOUNTER — Ambulatory Visit (INDEPENDENT_AMBULATORY_CARE_PROVIDER_SITE_OTHER): Payer: Medicare PPO | Admitting: Surgery

## 2012-04-29 ENCOUNTER — Encounter (INDEPENDENT_AMBULATORY_CARE_PROVIDER_SITE_OTHER): Payer: Self-pay | Admitting: Surgery

## 2012-04-29 VITALS — BP 118/64 | HR 80 | Resp 18 | Ht 63.0 in | Wt 176.0 lb

## 2012-04-29 DIAGNOSIS — R1314 Dysphagia, pharyngoesophageal phase: Secondary | ICD-10-CM

## 2012-04-29 DIAGNOSIS — K440 Diaphragmatic hernia with obstruction, without gangrene: Secondary | ICD-10-CM

## 2012-04-29 DIAGNOSIS — R1310 Dysphagia, unspecified: Secondary | ICD-10-CM

## 2012-04-29 DIAGNOSIS — R079 Chest pain, unspecified: Secondary | ICD-10-CM

## 2012-04-29 MED ORDER — ONDANSETRON HCL 4 MG PO TABS: 4.0000 mg | ORAL_TABLET | Freq: Three times a day (TID) | ORAL | Status: DC | PRN

## 2012-04-29 MED ORDER — OXYCODONE HCL 5 MG PO TABS: 5.0000 mg | ORAL_TABLET | Freq: Four times a day (QID) | ORAL | Status: DC | PRN

## 2012-04-29 NOTE — Patient Instructions (Addendum)
Go ahead and stop here and acid medicines (omeprazole)  Go back to full exercise.  Okay to bathe.  EATING AFTER YOUR ESOPHAGEAL SURGERY (Stomach Fundoplication, Hiatal Hernia repair, Achalasia surgery, etc)  After your esophageal surgery, expect some sticking with swallowing over the next 1-2 months.    If food sticks when you eat, it is called "dysphagia".  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  It will gradually ease off over the next few months.  To help you through this temporary phase, we start you out on a pureed (blenderized) diet.  Your first meal in the hospital was thin liquids.  You should have been given a pureed diet by the time you left the hospital.  We ask patients to stay on a pureed diet for the first 2-3 weeks to avoid anything getting "stuck" near your recent surgery.  Don't be alarmed if your ability to swallow doesn't progress according to this plan.  Everyone is different and some diets can advance more or less quickly.     Some BASIC RULES to follow are:  Maintain an upright position whenever eating or drinking.  Take small bites - just a teaspoon size bite at a time.  Eat slowly.  It may also help to eat only one food at a time.  Consider nibbling through smaller, more frequent meals & avoid the urge to eat BIG meals  Do not push through feelings of fullness, nausea, or bloatedness  Do not mix solid foods and liquids in the same mouthful  Try not to "wash foods down" with large gulps of liquids. Avoid carbonated (bubbly/fizzy) drinks.  Understand that it will be hard to burp and belch at first.  This gradually improves with time.  Expect to be more gassy/flatulent/bloated initially.  Walking will help you work through that.  Maalox/Gas-X can help as well.  Eat in a relaxed atmosphere & minimize distractions.  Avoid talking while eating.  Following each meal, sit in an upright position (90 degree angle) for 30  to 45 minutes.  Going for a short walk can help as well  If food does stick, don't panic.  Try to relax and let the food pass on its own.  Sipping WARM LIQUID such as strong hot black tea can also help slide it down.   Be gradual in changes & use common sense:  -If you easily tolerating a certain "level" of foods, advance to the next level gradually -If you are having trouble swallowing a particular food, then avoid it.   -If food is sticking when you advance your diet, go back to thinner previous diet (the lower LEVEL) for 1-2 days.  LEVEL 1 = PUREED DIET  Do for the first 2 WEEKS AFTER SURGERY  -Foods in this group are pureed or blenderized to a smooth, mashed potato-like consistency.  -If necessary, the pureed foods can keep their shape with the addition of a thickening agent.   -Meat should be pureed to a smooth, pasty consistency.  Hot broth or gravy may be added to the pureed meat, approximately 1 oz. of liquid per 3 oz. serving of meat. -CAUTION:  If any foods do not puree into a smooth consistency, swallowing will be more difficult.  (For example, nuts or seeds sometimes do not blend well.)  Hot Foods Cold Foods  Pureed scrambled eggs and cheese Pureed cottage cheese  Baby cereals Thickened juices and nectars  Thinned cooked cereals (no lumps)  Thickened milk or eggnog  Pureed [place] toast or pancakes Ensure  Mashed potatoes Ice cream  Pureed parsley, au gratin, scalloped potatoes, candied sweet potatoes Fruit or [place] ice, sherbet  Pureed buttered or alfredo noodles Plain yogurt  Pureed vegetables (no corn or peas) Instant breakfast  Pureed soups and creamed soups Smooth pudding, mousse, custard  Pureed scalloped apples Whipped gelatin  Gravies Sugar, syrup, honey, jelly  Sauces, cheese, tomato, barbecue, white, creamed Cream  Any baby food Creamer  Alcohol in moderation (not beer or champagne) Margarine  Coffee or tea Mayonnaise   Ketchup, mustard   Apple sauce    SAMPLE MENU:  PUREED DIET Breakfast Lunch Dinner   Orange juice, 1/2 cup  Cream of wheat, 1/2 cup  Pineapple juice, 1/2 cup  Pureed [place], barley soup, 3/4 cup  Pureed Hawaiian chicken, 3 oz   Scrambled eggs, mashed or blended with cheese, 1/2 cup  Tea or coffee, 1 cup   Whole milk, 1 cup   Non-dairy creamer, 2 Tbsp.  Mashed potatoes, 1/2 cup  Pureed cooled broccoli, 1/2 cup  Apple sauce, 1/2 cup  Coffee or tea  Mashed potatoes, 1/2 cup  Pureed spinach, 1/2 cup  Frozen yogurt, 1/2 cup  Tea or coffee      LEVEL 2 = SOFT DIET  After your first 2 weeks, you can advance to a soft diet.   Keep on this diet until everything goes down easily.  Hot Foods Cold Foods  White fish Cottage cheese  Stuffed fish Junior baby fruit  Baby food meals Semi thickened juices  Minced soft cooked, scrambled, poached eggs nectars  Souffle & omelets Ripe mashed bananas  Cooked cereals Canned fruit, pineapple sauce, milk  potatoes Milkshake  Buttered or Alfredo noodles Custard  Cooked cooled vegetable Puddings, including tapioca  Sherbet Yogurt  Vegetable soup or alphabet soup Fruit ice, [place] ice  Gravies Whipped gelatin  Sugar, syrup, honey, jelly Junior baby desserts  Sauces:  Cheese, creamed, barbecue, tomato, white Cream  Coffee or tea Margarine   SAMPLE MENU:  LEVEL 2 Breakfast Lunch Dinner   Orange juice, 1/2 cup  Oatmeal, 1/2 cup  Scrambled eggs with cheese, 1/2 cup  Decaffeinated tea, 1 cup  Whole milk, 1 cup  Non-dairy creamer, 2 Tbsp  Pineapple juice, 1/2 cup  Minced beef, 3 oz  Gravy, 2 Tbsp  Mashed potatoes, 1/2 cup  Minced fresh broccoli, 1/2 cup  Applesauce, 1/2 cup  Coffee, 1 cup  [place], barley soup, 3/4 cup  Minced Hawaiian chicken, 3 oz  Mashed potatoes, 1/2 cup  Cooked spinach, 1/2 cup  Frozen yogurt, 1/2 cup  Non-dairy creamer, 2 Tbsp      LEVEL 3 = CHOPPED DIET  -After all the foods in level 2 (soft diet) are  passing through well you should advance up to more chopped foods.  -It is still important to cut these foods into small pieces and eat slowly.  Hot Foods Cold Foods  Poultry Cottage cheese  Chopped Swedish meatballs Yogurt  Meat salads (ground or flaked meat) Milk  Flaked fish (tuna) Milkshakes  Poached or scrambled eggs Soft, cold, dry cereal  Souffles and omelets Fruit juices or nectars  Cooked cereals Chopped canned fruit  Chopped [place] toast or pancakes Canned fruit cocktail  Noodles or pasta (no rice) Pudding, mousse, custard  Cooked vegetables (no frozen peas, corn, or mixed vegetables) Green salad  Canned small sweet peas Ice cream  Creamed soup or vegetable soup Fruit ice, [place]  ice  Pureed vegetable soup or alphabet soup Non-dairy creamer  Ground scalloped apples Margarine  Gravies Mayonnaise  Sauces:  Cheese, creamed, barbecue, tomato, white Ketchup  Coffee or tea Mustard   SAMPLE MENU:  LEVEL 3 Breakfast Lunch Dinner   Orange juice, 1/2 cup  Oatmeal, 1/2 cup  Scrambled eggs with cheese, 1/2 cup  Decaffeinated tea, 1 cup  Whole milk, 1 cup  Non-dairy creamer, 2 Tbsp  Ketchup, 1 Tbsp  Margarine, 1 tsp  Salt, 1/4 tsp  Sugar, 2 tsp  Pineapple juice, 1/2 cup  Ground beef, 3 oz  Gravy, 2 Tbsp  Mashed potatoes, 1/2 cup  Cooked spinach, 1/2 cup  Applesauce, 1/2 cup  Decaffeinated coffee  Whole milk  Non-dairy creamer, 2 Tbsp  Margarine, 1 tsp  Salt, 1/4 tsp  Pureed [place], barley soup, 3/4 cup  Barbecue chicken, 3 oz  Mashed potatoes, 1/2 cup  Ground fresh broccoli, 1/2 cup  Frozen yogurt, 1/2 cup  Decaffeinated tea, 1 cup  Non-dairy creamer, 2 Tbsp  Margarine, 1 tsp  Salt, 1/4 tsp  Sugar, 1 tsp    LEVEL 4:  REGULAR FOODS  -Foods in this group are soft, moist, regularly textured foods.   -This level includes meat and breads, which tend to be the hardest things to swallow.   -Eat very slowly, chew well and continue to  avoid carbonated drinks. -most people are at this level in 4-6 weeks  Hot Foods Cold Foods  Baked fish or skinned Soft cheeses - cottage cheese  Souffles and omelets Cream cheese  Eggs Yogurt  Stuffed shells Milk  Spaghetti with meat sauce Milkshakes  Cooked cereal Cold dry cereals (no nuts, dried fruit, coconut)  [place] toast or pancakes Crackers  Buttered toast Fruit juices or nectars  Noodles or pasta (no rice) Canned fruit  Potatoes (all types) Ripe bananas  Soft, cooked vegetables (no corn, lima, or baked beans) Peeled, ripe, fresh fruit  Creamed soups or vegetable soup Cakes (no nuts, dried fruit, coconut)  Canned chicken noodle soup Plain doughnuts  Gravies Ice cream  Bacon dressing Pudding, mousse, custard  Sauces:  Cheese, creamed, barbecue, tomato, white Fruit ice, [place] ice, sherbet  Decaffeinated tea or coffee Whipped gelatin  Pork chops Regular gelatin   Canned fruited gelatin molds   Sugar, syrup, honey, jam, jelly   Cream   Non-dairy   Margarine   Oil   Mayonnaise   Ketchup   Mustard    If you have any questions please call our office at [facility]: [phone].  GETTING TO GOOD BOWEL HEALTH. Irregular bowel habits such as constipation and diarrhea can lead to many problems over time.  Having one soft bowel movement a day is the most important way to prevent further problems.  The anorectal canal is designed to handle stretching and feces to safely manage our ability to get rid of solid waste (feces, poop, stool) out of our body.  BUT, hard constipated stools can act like ripping concrete bricks and diarrhea can be a burning fire to this very sensitive area of our body, causing inflamed hemorrhoids, anal fissures, increasing risk is perirectal abscesses, abdominal pain/bloating, an making irritable bowel worse.     The goal: ONE SOFT BOWEL MOVEMENT A DAY!  To have soft, regular bowel movements:    Drink at least 8 tall glasses of water a day.      Take plenty of fiber.  Fiber is the undigested part of plant food that passes into  the colon, acting s "natures broom" to encourage bowel motility and movement.  Fiber can absorb and hold large amounts of water. This results in a larger, bulkier stool, which is soft and easier to pass. Work gradually over several weeks up to 6 servings a day of fiber (25g a day even more if needed) in the form of: o Vegetables -- Root (potatoes, carrots, turnips), leafy green (lettuce, salad greens, celery, spinach), or cooked high residue (cabbage, broccoli, etc) o Fruit -- Fresh (unpeeled skin & pulp), Dried (prunes, apricots, cherries, etc ),  or stewed ( applesauce)  o Whole grain breads, pasta, etc (whole wheat)  o Bran cereals    Bulking Agents -- This type of water-retaining fiber generally is easily obtained each day by one of the following:  o Psyllium bran -- The psyllium plant is remarkable because its ground seeds can retain so much water. This product is available as Metamucil, Konsyl, Effersyllium, Per Diem Fiber, or the less expensive generic preparation in drug and health food stores. Although labeled a laxative, it really is not a laxative.  o Methylcellulose -- This is another fiber derived from wood which also retains water. It is available as Citrucel. o Polyethylene Glycol - and "artificial" fiber commonly called Miralax or Glycolax.  It is helpful for people with gassy or bloated feelings with regular fiber o Flax Seed - a less gassy fiber than psyllium   No reading or other relaxing activity while on the toilet. If bowel movements take longer than 5 minutes, you are too constipated   AVOID CONSTIPATION.  High fiber and water intake usually takes care of this.  Sometimes a laxative is needed to stimulate more frequent bowel movements, but    Laxatives are not a good long-term solution as it can wear the colon out. o Osmotics (Milk of Magnesia, Fleets phosphosoda, Magnesium citrate, MiraLax,  GoLytely) are safer than  o Stimulants (Senokot, Castor Oil, Dulcolax, Ex Lax)    o Do not take laxatives for more than 7days in a row.    IF SEVERELY CONSTIPATED, try a Bowel Retraining Program: o Do not use laxatives.  o Eat a diet high in roughage, such as bran cereals and leafy vegetables.  o Drink six (6) ounces of prune or apricot juice each morning.  o Eat two (2) large servings of stewed fruit each day.  o Take one (1) heaping tablespoon of a psyllium-based bulking agent twice a day. Use sugar-free sweetener when possible to avoid excessive calories.  o Eat a normal breakfast.  o Set aside 15 minutes after breakfast to sit on the toilet, but do not strain to have a bowel movement.  o If you do not have a bowel movement by the third day, use an enema and repeat the above steps.    Controlling diarrhea o Switch to liquids and simpler foods for a few days to avoid stressing your intestines further. o Avoid dairy products (especially milk & ice cream) for a short time.  The intestines often can lose the ability to digest lactose when stressed. o Avoid foods that cause gassiness or bloating.  Typical foods include beans and other legumes, cabbage, broccoli, and dairy foods.  Every person has some sensitivity to other foods, so listen to our body and avoid those foods that trigger problems for you. o Adding fiber (Citrucel, Metamucil, psyllium, Miralax) gradually can help thicken stools by absorbing excess fluid and retrain the intestines to act more normally.  Slowly increase  the dose over a few weeks.  Too much fiber too soon can backfire and cause cramping & bloating. o Probiotics (such as active yogurt, Align, etc) may help repopulate the intestines and colon with normal bacteria and calm down a sensitive digestive tract.  Most studies show it to be of mild help, though, and such products can be costly. o Medicines:   Bismuth subsalicylate (ex. Kayopectate, Pepto Bismol) every 30 minutes for  up to 6 doses can help control diarrhea.  Avoid if pregnant.   Loperamide (Immodium) can slow down diarrhea.  Start with two tablets (4mg  total) first and then try one tablet every 6 hours.  Avoid if you are having fevers or severe pain.  If you are not better or start feeling worse, stop all medicines and call your doctor for advice o Call your doctor if you are getting worse or not better.  Sometimes further testing (cultures, endoscopy, X-ray studies, bloodwork, etc) may be needed to help diagnose and treat the cause of the diarrhea. o

## 2012-04-29 NOTE — Progress Notes (Signed)
Subjective:     Patient ID: Nicole Meyer, female   DOB: 08/29/1940, 72 y.o.   MRN: [id number]  HPI  Nicole Meyer  [birth year] [id number]  Patient Care Team: Thora Lance, MD as PCP - General (Family Medicine) Barrie Folk, MD (Gastroenterology) Exie Parody, MD (Hematology and Oncology) Louis Meckel, MD as Consulting Physician (Gastroenterology)  This patient is a 72 y.o.female who presents today for surgical evaluation.   POST-OPERATIVE DIAGNOSIS: Paraespohageal hiatal hernia with chest pain and anemia   PROCEDURE:  1. Laparoscopic reduction and primary repair of paraesophageal hiatal  Hernia over pledgets.  2. Nissen fundoplication 2cm over a 56-French bougie.  3. Anterior & posterior gastropexy  4. Type 2 mediastinal dissection.   Reason for visit: Followup   Patient comes in today with her husband.  He notes she is sleeping better breathing much better at night.  She does have some hyper flatulence.  No dysphasia.  She is afraid to go to solid foods.  Pretty much doing a full liquid diet.  Loose stools with that.  Not trying any fiber or other bowel regimen.  No fevers or chills.  A couple episodes of nausea.  Had a big problem with her insurance company allowing her to take Phenergan even though she has done in the past.  Ran out of narcotics and wondered if she could have a few more.  Wanting to get back to doing water aerobics.  Overall feels better than she did before surgery already.  In good spirits.  Has been very appreciative of her recovery so far.  Patient Active Problem List  Diagnosis  . Anemia  . Leukocytopenia  . HTN (hypertension)  . Asthma  . IBS (irritable bowel syndrome)  . Arthritis  . Anxiety  . Chronic kidney disease (CKD), stage III (moderate)  . Hypercalcemia  . Fatigue  . Lymphopenia  . Syncope and collapse  . Acute epigastric pain  . COPD (chronic obstructive pulmonary disease)  . Chest pain, noncardiac - probably due to giant  hiatal hernia  . 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 04/08/2012  . UTI (lower urinary tract infection)  . Esophageal dysphagia from hiatal hernia with occasional vomiting  . Obesity (BMI 30-39.9)  . Umbilical hernia  . Protein-calorie malnutrition, moderate    Past Medical History  Diagnosis Date  . Leukocytopenia   . HTN (hypertension)   . Asthma   . IBS (irritable bowel syndrome)   . Anxiety   . Depression   . Chronic kidney disease (CKD), stage III (moderate)   . Hypercalcemia   . Fatigue   . Lymphopenia 08/23/2011  . COPD (chronic obstructive pulmonary disease)   . Renal insufficiency   . UTI (urinary tract infection) 12/26/2011  . Syncope and collapse 12/24/2011    "loss of consciousness for 3-4 min" (12/26/2011)  . Hypercholesteremia     "above borderline; I don't take RX for it" (12/26/2011)  . H/O hiatal hernia   . GERD (gastroesophageal reflux disease)   . Arthritis     "knees and back" (12/26/2011)  . Osteoporosis   . 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 Iron deficiency anemia     [facility]    Past Surgical History  Procedure Laterality Date  . Hernia repair  1970's    "stomach" (12/26/2011)  . Abdominal hysterectomy  ~ 1980  . Esophagogastroduodenoscopy  12/26/2011    Procedure:  ESOPHAGOGASTRODUODENOSCOPY (EGD);  Surgeon: Louis Meckel, MD;  Location: [facility] ENDOSCOPY;  Service: Endoscopy;  Laterality: N/A;  . Breast surgery  04-06-12    rt. lumpectomy_benign  . Tubal ligation    . Laparoscopic nissen fundoplication N/A 04/08/2012    Procedure: Laparoscopic Reduction and Repair of Paraesophagel Hiatal Hernia with Nissen;  Surgeon: Ardeth Sportsman, MD;  Location: [facility] ORS;  Service: General;  Laterality: N/A;  Laparoscopic Reduction and Repair of Paraesophagel Hiatal Hernia with Nissen    History   Social History  . Marital Status: Married    Spouse Name: N/A    Number of Children: 2  .  Years of Education: N/A   Occupational History  .      retired Armed forces training and education officer   Social History Main Topics  . Smoking status: Former Smoker -- 1.00 packs/day for 40 years    Types: Cigarettes    Quit date: 02/19/1999  . Smokeless tobacco: Never Used  . Alcohol Use: No  . Drug Use: No  . Sexually Active: No   Other Topics Concern  . Not on file   Social History Narrative  . No narrative on file    Family History  Problem Relation Age of Onset  . Diabetes Father   . Heart disease Father   . Cancer Maternal Aunt     Breast cancer    Current Outpatient Prescriptions  Medication Sig Dispense Refill  . albuterol (PROVENTIL) (2.5 MG/3ML) 0.083% nebulizer solution Take 2.5 mg by nebulization daily. Wheezing and shortness of breath      . aspirin 81 MG tablet Take 81 mg by mouth daily.      . beta carotene w/minerals (OCUVITE) tablet Take 1 tablet by mouth daily.      . Calcium Carb-Cholecalciferol (CALCIUM + D3) 600-200 MG-UNIT TABS Take 1 tablet by mouth daily.      [name] lisinopril-hydrochlorothiazide (PRINZIDE,ZESTORETIC) 20-12.5 MG per tablet Take 1 tablet by mouth every morning.       . Multiple Vitamin (MULTIVITAMIN) tablet Take 1 tablet by mouth daily.      [name] omeprazole (PRILOSEC) 20 MG capsule Take 20 mg by mouth daily.      . promethazine (PHENERGAN) 12.5 MG tablet Take 1-2 tablets (12.5-25 mg total) by mouth every 6 (six) hours as needed for nausea.  20 tablet  3  . promethazine (PHENERGAN) 25 MG suppository Place 1 suppository (25 mg total) rectally every 6 (six) hours as needed for nausea.  5 suppository  3  . oxyCODONE (OXY IR/ROXICODONE) 5 MG immediate release tablet Take 1-2 tablets (5-10 mg total) by mouth every 4 (four) hours as needed for pain.  40 tablet  0   No current facility-administered medications for this visit.     Allergies  Allergen Reactions  . Penicillins Hives  . Brovana (Arformoterol) Other (See Comments)    "makes me nervous"  . Paxil  (Paroxetine Hcl) Rash    BP 118/64  Pulse 80  Resp 18  Ht 5\' 3"  (1.6 m)  Wt 176 lb (79.833 kg)  BMI 31.18 kg/m2  Dg Chest Port 1 View  04/08/2012  *RADIOLOGY REPORT*  Clinical Data: Upper right chest pain and shortness of breath. Para esophageal hernia repair earlier today.  PORTABLE CHEST - 1 VIEW  Comparison: 12/24/2011  Findings: At least one posterolateral left rib fracture, nonacute. A drain projects over the left-sided chest is likely within the mediastinum.  Normal heart size for level of inspiration.  Small left  pleural effusion versus thickening, new. No pneumothorax.  Low lung volumes.  Patchy left base air space disease.  IMPRESSION: New pleural parenchymal opacity on the left, likely postoperative.  No pneumothorax or convincing evidence of pneumomediastinum.   Original Report Authenticated By: Jeronimo Greaves, [name]    Dg Esophagus W/water Sol Cm  04/09/2012  *RADIOLOGY REPORT*  Clinical Data: Status post Nissen fundoplication.  Evaluate for possible leak or obstruction.  ESOPHOGRAM/BARIUM SWALLOW  Technique:  Single contrast examination was performed using 35 ml of Omnipaque-300.  Fluoroscopy time:  1.36 minutes.  Comparison:  No priors.  Findings:  Limited water-soluble esophagram demonstrated soft tissue prominence around the gastroesophageal junction compatible with a Nissen wrap.  Contrast readily passed through this region after a short delay into the proximal stomach.  No evidence of extravasation of contrast was noted.  The esophagus appeared mildly dilated, with irregular peristalsis, including extensive tertiary contractions.  The surgical drain was noted around the distal esophagus and the gastroesophageal junction.  IMPRESSION: 1.  Postoperative changes of Nissen fundoplication without evidence of obstruction or leak. 2.  Nonspecific esophageal motility disorder, including tertiary contractions.   Original Report Authenticated By: Trudie Reed, [name]      Review of Systems   Constitutional: Negative for fever, chills and diaphoresis.  HENT: Negative for ear pain, sore throat and trouble swallowing.   Eyes: Negative for photophobia and visual disturbance.  Respiratory: Negative for cough, choking, chest tightness, shortness of breath, wheezing and stridor.   Cardiovascular: Negative for chest pain and palpitations.  Gastrointestinal: Positive for nausea. Negative for vomiting, abdominal pain, diarrhea, constipation, anal bleeding and rectal pain.  Genitourinary: Negative for dysuria, frequency and difficulty urinating.  Musculoskeletal: Negative for myalgias and gait problem.  Skin: Negative for color change, pallor and rash.  Neurological: Negative for dizziness, speech difficulty, weakness and numbness.  Hematological: Negative for adenopathy.  Psychiatric/Behavioral: Negative for confusion and agitation. The patient is not nervous/anxious.        Objective:   Physical Exam  Constitutional: She is oriented to person, place, and time. She appears well-developed and well-nourished. No distress.  HENT:  Head: Normocephalic.  Mouth/Throat: Oropharynx is clear and moist. No oropharyngeal exudate.  Eyes: Conjunctivae and EOM are normal. Pupils are equal, round, and reactive to light. No scleral icterus.  Neck: Normal range of motion. No tracheal deviation present.  Cardiovascular: Normal rate and intact distal pulses.   Pulmonary/Chest: Effort normal. No respiratory distress. She exhibits no tenderness.  Abdominal: Soft. She exhibits no distension. There is no tenderness. Hernia confirmed negative in the right inguinal area and confirmed negative in the left inguinal area.  Incisions clean with normal healing ridges.  No hernias  Genitourinary: No vaginal discharge found.  Musculoskeletal: Normal range of motion. She exhibits no tenderness.  Lymphadenopathy:       Right: No inguinal adenopathy present.       Left: No inguinal adenopathy present.   Neurological: She is alert and oriented to person, place, and time. No cranial nerve deficit. She exhibits normal muscle tone. Coordination normal.  Skin: Skin is warm and dry. No rash noted. She is not diaphoretic.  Psychiatric: She has a normal mood and affect. Her behavior is normal.       Assessment:     Recovering relatively well three weeks out from laparoscopic paraesophageal hiatal hernia repair and fundoplication. Also repair of umbilical hernia.    Plan:     Increase activity as tolerated to regular activity.  Water  aerobics okay.  Renewed oxycodone #40.Do not push through pain.  Diet as tolerated.Get back to a solid diet.  Consider flaxseed and Pepto-Bismolto slow down and thicken up stools.   Bowel regimen to avoid problems.  Stop PPI.  No longer needed.  Switch to Zofran for nausea control.  See if insurance will pay for that.  She needs some type of antinausea medicines long term.  She is tolerated Phenergan fine in the past.  Hopefully insurance will not fight her on this anymore.  It is essential for good nausea control to prevent the hiatal hernia out from slipping and failing.  Return to clinic 1 month.   Instructions discussed.  Followup with primary care physician for other health issues as would normally be done.  Questions answered.  The patient expressed understanding and appreciation

## 2012-05-27 ENCOUNTER — Encounter (INDEPENDENT_AMBULATORY_CARE_PROVIDER_SITE_OTHER): Payer: Self-pay | Admitting: Surgery

## 2012-05-27 ENCOUNTER — Ambulatory Visit (INDEPENDENT_AMBULATORY_CARE_PROVIDER_SITE_OTHER): Payer: Medicare PPO | Admitting: Surgery

## 2012-05-27 VITALS — BP 162/76 | HR 76 | Temp 97.2°F | Resp 16 | Ht 63.0 in | Wt 174.0 lb

## 2012-05-27 DIAGNOSIS — K440 Diaphragmatic hernia with obstruction, without gangrene: Secondary | ICD-10-CM

## 2012-05-27 DIAGNOSIS — K429 Umbilical hernia without obstruction or gangrene: Secondary | ICD-10-CM

## 2012-05-27 NOTE — Addendum Note (Signed)
Addended by: Ardeth Sportsman on: 05/27/2012 10:32 AM   Modules accepted: Orders, Medications

## 2012-05-27 NOTE — Patient Instructions (Addendum)
EATING AFTER YOUR ESOPHAGEAL SURGERY (Stomach Fundoplication, Hiatal Hernia repair, Achalasia surgery, etc)  After your esophageal surgery, expect some sticking with swallowing over the next 1-2 months.    If food sticks when you eat, it is called "dysphagia".  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  It will gradually ease off over the next few months.  To help you through this temporary phase, we start you out on a pureed (blenderized) diet.  Your first meal in the hospital was thin liquids.  You should have been given a pureed diet by the time you left the hospital.  We ask patients to stay on a pureed diet for the first 2-3 weeks to avoid anything getting "stuck" near your recent surgery.  Don't be alarmed if your ability to swallow doesn't progress according to this plan.  Everyone is different and some diets can advance more or less quickly.     Some BASIC RULES to follow are:  Maintain an upright position whenever eating or drinking.  Take small bites - just a teaspoon size bite at a time.  Eat slowly.  It may also help to eat only one food at a time.  Consider nibbling through smaller, more frequent meals & avoid the urge to eat BIG meals  Do not push through feelings of fullness, nausea, or bloatedness  Do not mix solid foods and liquids in the same mouthful  Try not to "wash foods down" with large gulps of liquids. Avoid carbonated (bubbly/fizzy) drinks.  Understand that it will be hard to burp and belch at first.  This gradually improves with time.  Expect to be more gassy/flatulent/bloated initially.  Walking will help you work through that.  Maalox/Gas-X can help as well.  Eat in a relaxed atmosphere & minimize distractions.  Avoid talking while eating.    Do not use straws.  Following each meal, sit in an upright position (90 degree angle) for 60 to 90 minutes.  Going for a short walk can help as well  If food does stick,  don't panic.  Try to relax and let the food pass on its own.  Sipping WARM LIQUID such as strong hot black tea can also help slide it down.   Be gradual in changes & use common sense:  -If you easily tolerating a certain "level" of foods, advance to the next level gradually -If you are having trouble swallowing a particular food, then avoid it.   -If food is sticking when you advance your diet, go back to thinner previous diet (the lower LEVEL) for 1-2 days.  LEVEL 1 = PUREED DIET  Do for the first 2 WEEKS AFTER SURGERY  -Foods in this group are pureed or blenderized to a smooth, mashed potato-like consistency.  -If necessary, the pureed foods can keep their shape with the addition of a thickening agent.   -Meat should be pureed to a smooth, pasty consistency.  Hot broth or gravy may be added to the pureed meat, approximately 1 oz. of liquid per 3 oz. serving of meat. -CAUTION:  If any foods do not puree into a smooth consistency, swallowing will be more difficult.  (For example, nuts or seeds sometimes do not blend well.)  Hot Foods Cold Foods  Pureed scrambled eggs and cheese Pureed cottage cheese  Baby cereals Thickened juices and nectars  Thinned cooked cereals (no lumps) Thickened milk or eggnog  Pureed [place] toast or pancakes Ensure  Mashed  potatoes Ice cream  Pureed parsley, au gratin, scalloped potatoes, candied sweet potatoes Fruit or [place] ice, sherbet  Pureed buttered or alfredo noodles Plain yogurt  Pureed vegetables (no corn or peas) Instant breakfast  Pureed soups and creamed soups Smooth pudding, mousse, custard  Pureed scalloped apples Whipped gelatin  Gravies Sugar, syrup, honey, jelly  Sauces, cheese, tomato, barbecue, white, creamed Cream  Any baby food Creamer  Alcohol in moderation (not beer or champagne) Margarine  Coffee or tea Mayonnaise   Ketchup, mustard   Apple sauce   SAMPLE MENU:  PUREED DIET Breakfast Lunch Dinner   Orange juice, 1/2  cup  Cream of wheat, 1/2 cup  Pineapple juice, 1/2 cup  Pureed [place], barley soup, 3/4 cup  Pureed Hawaiian chicken, 3 oz   Scrambled eggs, mashed or blended with cheese, 1/2 cup  Tea or coffee, 1 cup   Whole milk, 1 cup   Non-dairy creamer, 2 Tbsp.  Mashed potatoes, 1/2 cup  Pureed cooled broccoli, 1/2 cup  Apple sauce, 1/2 cup  Coffee or tea  Mashed potatoes, 1/2 cup  Pureed spinach, 1/2 cup  Frozen yogurt, 1/2 cup  Tea or coffee      LEVEL 2 = SOFT DIET  After your first 2 weeks, you can advance to a soft diet.   Keep on this diet until everything goes down easily.  Hot Foods Cold Foods  White fish Cottage cheese  Stuffed fish Junior baby fruit  Baby food meals Semi thickened juices  Minced soft cooked, scrambled, poached eggs nectars  Souffle & omelets Ripe mashed bananas  Cooked cereals Canned fruit, pineapple sauce, milk  potatoes Milkshake  Buttered or Alfredo noodles Custard  Cooked cooled vegetable Puddings, including tapioca  Sherbet Yogurt  Vegetable soup or alphabet soup Fruit ice, [place] ice  Gravies Whipped gelatin  Sugar, syrup, honey, jelly Junior baby desserts  Sauces:  Cheese, creamed, barbecue, tomato, white Cream  Coffee or tea Margarine   SAMPLE MENU:  LEVEL 2 Breakfast Lunch Dinner   Orange juice, 1/2 cup  Oatmeal, 1/2 cup  Scrambled eggs with cheese, 1/2 cup  Decaffeinated tea, 1 cup  Whole milk, 1 cup  Non-dairy creamer, 2 Tbsp  Pineapple juice, 1/2 cup  Minced beef, 3 oz  Gravy, 2 Tbsp  Mashed potatoes, 1/2 cup  Minced fresh broccoli, 1/2 cup  Applesauce, 1/2 cup  Coffee, 1 cup  [place], barley soup, 3/4 cup  Minced Hawaiian chicken, 3 oz  Mashed potatoes, 1/2 cup  Cooked spinach, 1/2 cup  Frozen yogurt, 1/2 cup  Non-dairy creamer, 2 Tbsp      LEVEL 3 = CHOPPED DIET  -After all the foods in level 2 (soft diet) are passing through well you should advance up to more chopped foods.  -It is still  important to cut these foods into small pieces and eat slowly.  Hot Foods Cold Foods  Poultry Cottage cheese  Chopped Swedish meatballs Yogurt  Meat salads (ground or flaked meat) Milk  Flaked fish (tuna) Milkshakes  Poached or scrambled eggs Soft, cold, dry cereal  Souffles and omelets Fruit juices or nectars  Cooked cereals Chopped canned fruit  Chopped [place] toast or pancakes Canned fruit cocktail  Noodles or pasta (no rice) Pudding, mousse, custard  Cooked vegetables (no frozen peas, corn, or mixed vegetables) Green salad  Canned small sweet peas Ice cream  Creamed soup or vegetable soup Fruit ice, [place] ice  Pureed vegetable soup or alphabet soup Non-dairy creamer  Ground scalloped  apples Margarine  Gravies Mayonnaise  Sauces:  Cheese, creamed, barbecue, tomato, white Ketchup  Coffee or tea Mustard   SAMPLE MENU:  LEVEL 3 Breakfast Lunch Dinner   Orange juice, 1/2 cup  Oatmeal, 1/2 cup  Scrambled eggs with cheese, 1/2 cup  Decaffeinated tea, 1 cup  Whole milk, 1 cup  Non-dairy creamer, 2 Tbsp  Ketchup, 1 Tbsp  Margarine, 1 tsp  Salt, 1/4 tsp  Sugar, 2 tsp  Pineapple juice, 1/2 cup  Ground beef, 3 oz  Gravy, 2 Tbsp  Mashed potatoes, 1/2 cup  Cooked spinach, 1/2 cup  Applesauce, 1/2 cup  Decaffeinated coffee  Whole milk  Non-dairy creamer, 2 Tbsp  Margarine, 1 tsp  Salt, 1/4 tsp  Pureed [place], barley soup, 3/4 cup  Barbecue chicken, 3 oz  Mashed potatoes, 1/2 cup  Ground fresh broccoli, 1/2 cup  Frozen yogurt, 1/2 cup  Decaffeinated tea, 1 cup  Non-dairy creamer, 2 Tbsp  Margarine, 1 tsp  Salt, 1/4 tsp  Sugar, 1 tsp    LEVEL 4:  REGULAR FOODS  -Foods in this group are soft, moist, regularly textured foods.   -This level includes meat and breads, which tend to be the hardest things to swallow.   -Eat very slowly, chew well and continue to avoid carbonated drinks. -most people are at this level in 4-6 weeks  Hot Foods  Cold Foods  Baked fish or skinned Soft cheeses - cottage cheese  Souffles and omelets Cream cheese  Eggs Yogurt  Stuffed shells Milk  Spaghetti with meat sauce Milkshakes  Cooked cereal Cold dry cereals (no nuts, dried fruit, coconut)  [place] toast or pancakes Crackers  Buttered toast Fruit juices or nectars  Noodles or pasta (no rice) Canned fruit  Potatoes (all types) Ripe bananas  Soft, cooked vegetables (no corn, lima, or baked beans) Peeled, ripe, fresh fruit  Creamed soups or vegetable soup Cakes (no nuts, dried fruit, coconut)  Canned chicken noodle soup Plain doughnuts  Gravies Ice cream  Bacon dressing Pudding, mousse, custard  Sauces:  Cheese, creamed, barbecue, tomato, white Fruit ice, [place] ice, sherbet  Decaffeinated tea or coffee Whipped gelatin  Pork chops Regular gelatin   Canned fruited gelatin molds   Sugar, syrup, honey, jam, jelly   Cream   Non-dairy   Margarine   Oil   Mayonnaise   Ketchup   Mustard    If you have any questions please call our office at [facility]: [phone].  Dysphagia Swallowing problems (dysphagia) occur when solids and liquids seem to stick in your throat on the way down to your stomach, or the food takes longer to get to the stomach. Other symptoms (problems) include regurgitating (burping) up food, noises coming from the throat, chest discomfort with swallowing, and a feeling of fullness in the throat when swallowing. When blockage in the throat is complete it may be associated with drooling. CAUSES There are many causes of swallowing difficulties and the following is generalized information regarding a number of reasons for this problem. Problems with swallowing may occur because of problems with the muscles. The food cannot be propelled in the usual manner into the stomach. There may be ulcers, scar tissue, or inflammation (soreness) in the esophagus (the food tube from the mouth to the stomach) which blocks food from  passing normally into the stomach. Causes of inflammation include acid reflux from the stomach into the esophagus. Inflammation can also be caused by the herpes simplex virus, Candida (yeast), radiation (as with  treatment of cancer), or inflammation from medicines not taken with adequate fluids to wash them down into the stomach. There may be nerve problems so signals cannot be sent adequately telling the muscles of the esophagus to contract and move the food along. Achalasia is a rare disorder of the esophagus in which muscular contractions of the esophagus are uncoordinated. Globus hystericus is a relatively common problem in young females in which there is a sense of an obstruction or difficulty in swallowing, but in which no abnormalities can be found. This problem usually improves over time with reassurance and testing to rule out other causes. DIAGNOSIS A number of tests will help your caregiver know what is the cause of your swallowing problems. These tests may include a barium swallow in which X-rays are taken while you are drinking a liquid that outlines the lining of the esophagus on X-ray. If the stomach and small bowel are also studied in this manner it is called an upper gastrointestinal exam (UGI). Endoscopy may be done in which your caregiver examines your throat, esophagus, stomach, and small bowel with a small, flexible scope. Motility studies which measure the effectiveness and coordination of the muscular contractions of the esophagus may also be done. TREATMENT The treatment of swallowing problems are many, varying from medicines to surgical treatment. The treatment varies with the type of problem found. Your caregiver will discuss your results and treatment with you. If swallowing problems are severe the long-term problems which may occur include: malnutrition, pneumonia (from food going into the breathing tubes called trachea and bronchi), and an increase in tumors (lumps) of the  esophagus. SEEK IMMEDIATE MEDICAL CARE IF:  Food or another object becomes lodged in your throat or esophagus and will not move. Document Released: 02/02/2000 Document Revised: 08/06/2011 Document Reviewed: 09/23/2007 [facility] Patient Information 2013 [place], [place].  Gastroesophageal Reflux Disease, Adult Gastroesophageal reflux disease (GERD) happens when acid from your stomach flows up into the esophagus. When acid comes in contact with the esophagus, the acid causes soreness (inflammation) in the esophagus. Over time, GERD may create small holes (ulcers) in the lining of the esophagus. CAUSES   Increased body weight. This puts pressure on the stomach, making acid rise from the stomach into the esophagus.  Smoking. This increases acid production in the stomach.  Drinking alcohol. This causes decreased pressure in the lower esophageal sphincter (valve or ring of muscle between the esophagus and stomach), allowing acid from the stomach into the esophagus.  Late evening meals and a full stomach. This increases pressure and acid production in the stomach.  A malformed lower esophageal sphincter. Sometimes, no cause is found. SYMPTOMS   Burning pain in the lower part of the mid-chest behind the breastbone and in the mid-stomach area. This may occur twice a week or more often.  Trouble swallowing.  Sore throat.  Dry cough.  Asthma-like symptoms including chest tightness, shortness of breath, or wheezing. DIAGNOSIS  Your caregiver may be able to diagnose GERD based on your symptoms. In some cases, X-rays and other tests may be done to check for complications or to check the condition of your stomach and esophagus. TREATMENT  Your caregiver may recommend over-the-counter or prescription medicines to help decrease acid production. Ask your caregiver before starting or adding any new medicines.  HOME CARE INSTRUCTIONS   Change the factors that you can control. Ask your caregiver for  guidance concerning weight loss, quitting smoking, and alcohol consumption.  Avoid foods and drinks that make your  symptoms worse, such as:  Caffeine or alcoholic drinks.  Chocolate.  Peppermint or mint flavorings.  Garlic and onions.  Spicy foods.  Citrus fruits, such as oranges, lemons, or limes.  Tomato-based foods such as sauce, chili, salsa, and pizza.  Fried and fatty foods.  Avoid lying down for the 3 hours prior to your bedtime or prior to taking a nap.  Eat small, frequent meals instead of large meals.  Wear loose-fitting clothing. Do not wear anything tight around your waist that causes pressure on your stomach.  Raise the head of your bed 6 to 8 inches with wood blocks to help you sleep. Extra pillows will not help.  Only take over-the-counter or prescription medicines for pain, discomfort, or fever as directed by your caregiver.  Do not take aspirin, ibuprofen, or other nonsteroidal anti-inflammatory drugs (NSAIDs). SEEK IMMEDIATE MEDICAL CARE IF:   You have pain in your arms, neck, jaw, teeth, or back.  Your pain increases or changes in intensity or duration.  You develop nausea, vomiting, or sweating (diaphoresis).  You develop shortness of breath, or you faint.  Your vomit is green, yellow, black, or looks like coffee grounds or blood.  Your stool is red, bloody, or black. These symptoms could be signs of other problems, such as heart disease, gastric bleeding, or esophageal bleeding. MAKE SURE YOU:   Understand these instructions.  Will watch your condition.  Will get help right away if you are not doing well or get worse. Document Released: 11/14/2004 Document Revised: 04/29/2011 Document Reviewed: 08/24/2010 [facility] Patient Information 2013 [place], [place].

## 2012-05-27 NOTE — Progress Notes (Signed)
Subjective:     Patient ID: Nicole Meyer, female   DOB: [birth year], 72 y.o.   MRN: [id number]  HPI   Nicole Meyer  08/12/1940 [id number]  Patient Care Team: Thora Lance, MD as PCP - General (Family Medicine) Barrie Folk, MD (Gastroenterology) Exie Parody, MD (Hematology and Oncology) Louis Meckel, MD as Consulting Physician (Gastroenterology)  This patient is a 72 y.o.female who presents today for surgical evaluation.   POST-OPERATIVE DIAGNOSIS: Paraespohageal hiatal hernia with chest pain and anemia   PROCEDURE:  1. Laparoscopic reduction and primary repair of paraesophageal hiatal  Hernia over pledgets.  2. Nissen fundoplication 2cm over a 56-French bougie.  3. Anterior & posterior gastropexy  4. Type 2 mediastinal dissection.   Reason for visit: Followup   Patient comes in today Feeling getting better.  Less bloating.  No nausea.  Back to solid diet.  Some dysphasia but getting better.  Exercising.  In good spirits.  No reflux off of PPI.  Narcotics helping with her knees but not needed for her abdomen anymore.  Overall feels better than she did before surgery already.  In good spirits.  Has been very appreciative of her recovery so far.  Patient Active Problem List  Diagnosis  . Anemia  . Leukocytopenia  . HTN (hypertension)  . Asthma  . IBS (irritable bowel syndrome)  . Arthritis  . Anxiety  . Chronic kidney disease (CKD), stage III (moderate)  . Hypercalcemia  . Fatigue  . Lymphopenia  . Syncope and collapse  . COPD (chronic obstructive pulmonary disease)  . Chest pain, noncardiac - probably due to giant hiatal hernia  . 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 04/08/2012  . Obesity (BMI 30-39.9)  . Umbilical hernia s/p primary repair 04/08/2012  . Protein-calorie malnutrition, moderate    Past Medical History  Diagnosis Date  . Leukocytopenia   . HTN (hypertension)   . Asthma   . IBS (irritable bowel syndrome)   .  Anxiety   . Depression   . Chronic kidney disease (CKD), stage III (moderate)   . Hypercalcemia   . Fatigue   . Lymphopenia 08/23/2011  . COPD (chronic obstructive pulmonary disease)   . Renal insufficiency   . UTI (urinary tract infection) 12/26/2011  . Syncope and collapse 12/24/2011    "loss of consciousness for 3-4 min" (12/26/2011)  . Hypercholesteremia     "above borderline; I don't take RX for it" (12/26/2011)  . H/O hiatal hernia   . GERD (gastroesophageal reflux disease)   . Arthritis     "knees and back" (12/26/2011)  . Osteoporosis   . 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 Iron deficiency anemia     [facility]    Past Surgical History  Procedure Laterality Date  . Hernia repair  1970's    "stomach" (12/26/2011)  . Abdominal hysterectomy  ~ 1980  . Esophagogastroduodenoscopy  12/26/2011    Procedure: ESOPHAGOGASTRODUODENOSCOPY (EGD);  Surgeon: Louis Meckel, MD;  Location: [facility] ENDOSCOPY;  Service: Endoscopy;  Laterality: N/A;  . Breast surgery  04-06-12    rt. lumpectomy_benign  . Tubal ligation    . Laparoscopic nissen fundoplication N/A 04/08/2012    Procedure: Laparoscopic Reduction and Repair of Paraesophagel Hiatal Hernia with Nissen;  Surgeon: Ardeth Sportsman, MD;  Location: [facility] ORS;  Service: General;  Laterality: N/A;  Laparoscopic Reduction and Repair of Paraesophagel Hiatal Hernia with Nissen  History   Social History  . Marital Status: Married    Spouse Name: N/A    Number of Children: 2  . Years of Education: N/A   Occupational History  .      retired Armed forces training and education officer   Social History Main Topics  . Smoking status: Former Smoker -- 1.00 packs/day for 40 years    Types: Cigarettes    Quit date: 02/19/1999  . Smokeless tobacco: Never Used  . Alcohol Use: No  . Drug Use: No  . Sexually Active: No   Other Topics Concern  . Not on file   Social History Narrative  . No narrative on file     Family History  Problem Relation Age of Onset  . Diabetes Father   . Heart disease Father   . Cancer Maternal Aunt     Breast cancer    Current Outpatient Prescriptions  Medication Sig Dispense Refill  . albuterol (PROVENTIL) (2.5 MG/3ML) 0.083% nebulizer solution Take 2.5 mg by nebulization daily. Wheezing and shortness of breath      . aspirin 81 MG tablet Take 81 mg by mouth daily.      . beta carotene w/minerals (OCUVITE) tablet Take 1 tablet by mouth daily.      . Calcium Carb-Cholecalciferol (CALCIUM + D3) 600-200 MG-UNIT TABS Take 1 tablet by mouth daily.      [name] lisinopril-hydrochlorothiazide (PRINZIDE,ZESTORETIC) 20-12.5 MG per tablet Take 1 tablet by mouth every morning.       . meloxicam (MOBIC) 15 MG tablet       . Multiple Vitamin (MULTIVITAMIN) tablet Take 1 tablet by mouth daily.      [name] omeprazole (PRILOSEC) 20 MG capsule Take 20 mg by mouth daily.      . ondansetron (ZOFRAN) 4 MG tablet Take 1 tablet (4 mg total) by mouth every 8 (eight) hours as needed for nausea.  10 tablet  3  . oxyCODONE (OXY IR/ROXICODONE) 5 MG immediate release tablet Take 1-2 tablets (5-10 mg total) by mouth every 6 (six) hours as needed for pain.  40 tablet  0  . promethazine (PHENERGAN) 12.5 MG tablet Take 1-2 tablets (12.5-25 mg total) by mouth every 6 (six) hours as needed for nausea.  20 tablet  3  . promethazine (PHENERGAN) 25 MG suppository Place 1 suppository (25 mg total) rectally every 6 (six) hours as needed for nausea.  5 suppository  3   No current facility-administered medications for this visit.     Allergies  Allergen Reactions  . Penicillins Hives  . Brovana (Arformoterol) Other (See Comments)    "makes me nervous"  . Paxil (Paroxetine Hcl) Rash    BP 162/76  Pulse 76  Temp(Src) 97.2 F (36.2 C) (Temporal)  Resp 16  Ht 5\' 3"  (1.6 m)  Wt 174 lb (78.926 kg)  BMI 30.83 kg/m2  Dg Chest Port 1 View  04/08/2012  *RADIOLOGY REPORT*  Clinical Data: Upper right chest  pain and shortness of breath. Para esophageal hernia repair earlier today.  PORTABLE CHEST - 1 VIEW  Comparison: 12/24/2011  Findings: At least one posterolateral left rib fracture, nonacute. A drain projects over the left-sided chest is likely within the mediastinum.  Normal heart size for level of inspiration.  Small left pleural effusion versus thickening, new. No pneumothorax.  Low lung volumes.  Patchy left base air space disease.  IMPRESSION: New pleural parenchymal opacity on the left, likely postoperative.  No pneumothorax or convincing evidence of pneumomediastinum.  Original Report Authenticated By: Jeronimo Greaves, [name]    Dg Esophagus W/water Sol Cm  04/09/2012  *RADIOLOGY REPORT*  Clinical Data: Status post Nissen fundoplication.  Evaluate for possible leak or obstruction.  ESOPHOGRAM/BARIUM SWALLOW  Technique:  Single contrast examination was performed using 35 ml of Omnipaque-300.  Fluoroscopy time:  1.36 minutes.  Comparison:  No priors.  Findings:  Limited water-soluble esophagram demonstrated soft tissue prominence around the gastroesophageal junction compatible with a Nissen wrap.  Contrast readily passed through this region after a short delay into the proximal stomach.  No evidence of extravasation of contrast was noted.  The esophagus appeared mildly dilated, with irregular peristalsis, including extensive tertiary contractions.  The surgical drain was noted around the distal esophagus and the gastroesophageal junction.  IMPRESSION: 1.  Postoperative changes of Nissen fundoplication without evidence of obstruction or leak. 2.  Nonspecific esophageal motility disorder, including tertiary contractions.   Original Report Authenticated By: Trudie Reed, [name]      Review of Systems  Constitutional: Negative for fever, chills and diaphoresis.  HENT: Negative for ear pain, sore throat and trouble swallowing.   Eyes: Negative for photophobia and visual disturbance.  Respiratory: Negative for  cough, choking, chest tightness, shortness of breath, wheezing and stridor.   Cardiovascular: Negative for chest pain and palpitations.  Gastrointestinal: Positive for nausea. Negative for vomiting, abdominal pain, diarrhea, constipation, anal bleeding and rectal pain.  Genitourinary: Negative for dysuria, frequency and difficulty urinating.  Musculoskeletal: Negative for myalgias and gait problem.  Skin: Negative for color change, pallor and rash.  Neurological: Negative for dizziness, speech difficulty, weakness and numbness.  Hematological: Negative for adenopathy.  Psychiatric/Behavioral: Negative for confusion and agitation. The patient is not nervous/anxious.        Objective:   Physical Exam  Constitutional: She is oriented to person, place, and time. She appears well-developed and well-nourished. No distress.  HENT:  Head: Normocephalic.  Mouth/Throat: Oropharynx is clear and moist. No oropharyngeal exudate.  Eyes: Conjunctivae and EOM are normal. Pupils are equal, round, and reactive to light. No scleral icterus.  Neck: Normal range of motion. No tracheal deviation present.  Cardiovascular: Normal rate and intact distal pulses.   Pulmonary/Chest: Effort normal. No respiratory distress. She exhibits no tenderness.  Abdominal: Soft. She exhibits no distension. There is no tenderness. Hernia confirmed negative in the right inguinal area and confirmed negative in the left inguinal area.  Incisions clean with normal healing ridges.  No hernias  Genitourinary: No vaginal discharge found.  Musculoskeletal: Normal range of motion. She exhibits no tenderness.  Lymphadenopathy:       Right: No inguinal adenopathy present.       Left: No inguinal adenopathy present.  Neurological: She is alert and oriented to person, place, and time. No cranial nerve deficit. She exhibits normal muscle tone. Coordination normal.  Skin: Skin is warm and dry. No rash noted. She is not diaphoretic.   Psychiatric: She has a normal mood and affect. Her behavior is normal.       Assessment:     Recovering relatively well 6 weeks out from laparoscopic paraesophageal hiatal hernia repair and fundoplication. Also repair of umbilical hernia.    Plan:     Increase activity as tolerated to regular activity.  Water aerobics okay.  Weight loss an important factor in preventing recurrent hiatal hernia.  She is focused on continuing to lose weight.  I did not review any more narcotics.  She says it helps her knees.  I  defer to her primary care physician on that.  Diet as tolerated.  Slow and steady on eating.  Smaller but more frequent meals better tolerated.  She is naturally doing that.  Maalox/Gas-X for bloating.  Bowel regimen to avoid problems.  PPI no longer needed.  Return to clinic PRN.    Instructions discussed.  Followup with primary care physician for other health issues as would normally be done.  Questions answered.  The patient expressed understanding and appreciation

## 2012-06-29 ENCOUNTER — Ambulatory Visit (INDEPENDENT_AMBULATORY_CARE_PROVIDER_SITE_OTHER): Payer: Medicare PPO | Admitting: Gastroenterology

## 2012-06-29 ENCOUNTER — Encounter: Payer: Self-pay | Admitting: Gastroenterology

## 2012-06-29 VITALS — BP 142/80 | HR 74 | Ht 63.0 in | Wt 170.2 lb

## 2012-06-29 DIAGNOSIS — R110 Nausea: Secondary | ICD-10-CM | POA: Insufficient documentation

## 2012-06-29 NOTE — Patient Instructions (Addendum)
Follow up as needed

## 2012-06-29 NOTE — Progress Notes (Signed)
History of Present Illness:  Nicole Meyer has returned for evaluation of nausea. Upper endoscopy in November, 2013 for chest pain was pertinent for a large sliding hiatal hernia measuring approximate 7 cm.  GI series verified these findings and also showed strong tertiary contractions of the esophagus consistent with an esophageal motility disorder. She underwent a Nissan fundoplication for a large paraesophageal hiatal hernia in February, 2014.  She has been taking narcotics since that time for joint pains. She's unable to take NSAIDs because of her kidney disease. Her main complaint is nausea, especially postprandially. She is unable to vomit. She is without chest or abdominal pain.     Review of Systems: Pertinent positive and negative review of systems were noted in the above HPI section. All other review of systems were otherwise negative.    Current Medications, Allergies, Past Medical History, Past Surgical History, Family History and Social History were reviewed in [employer] [facility] electronic medical record  Vital signs were reviewed in today's medical record. Physical Exam: General: Well developed , well nourished, no acute distress Skin: anicteric Head: Normocephalic and atraumatic Eyes:  sclerae anicteric, EOMI Ears: Normal auditory acuity Mouth: No deformity or lesions Lungs: Clear throughout to auscultation Heart: Regular rate and rhythm; no murmurs, rubs or bruits Abdomen: Soft, non tender and non distended. No masses, hepatosplenomegaly or hernias noted. Normal Bowel sounds Rectal:deferred Musculoskeletal: Symmetrical with no gross deformities  Pulses:  Normal pulses noted Extremities: No clubbing, cyanosis, edema or deformities noted Neurological: Alert oriented x 4, grossly nonfocal Psychological:  Alert and cooperative. Normal mood and affect

## 2012-06-29 NOTE — Assessment & Plan Note (Signed)
I suspect that nausea is related to chronic narcotic use. Unfortunately, therapeutic options for her joint pains are very limited because of her underlying kidney disease. Holding narcotics is probably the only remedy for nausea but that obviously leaves her without medicine for joint pains.  Plan to send a note to Dr. Manus Gunning and let him decide what he wants to do regarding joint pains.

## 2012-07-06 ENCOUNTER — Ambulatory Visit (HOSPITAL_BASED_OUTPATIENT_CLINIC_OR_DEPARTMENT_OTHER): Payer: Medicare PPO | Admitting: Oncology

## 2012-07-06 ENCOUNTER — Other Ambulatory Visit (HOSPITAL_BASED_OUTPATIENT_CLINIC_OR_DEPARTMENT_OTHER): Payer: Medicare PPO | Admitting: Lab

## 2012-07-06 ENCOUNTER — Telehealth: Payer: Self-pay | Admitting: Oncology

## 2012-07-06 ENCOUNTER — Encounter: Payer: Self-pay | Admitting: Oncology

## 2012-07-06 VITALS — BP 164/73 | HR 69 | Temp 97.2°F | Resp 18 | Ht 63.0 in | Wt 167.8 lb

## 2012-07-06 DIAGNOSIS — J449 Chronic obstructive pulmonary disease, unspecified: Secondary | ICD-10-CM

## 2012-07-06 DIAGNOSIS — D649 Anemia, unspecified: Secondary | ICD-10-CM

## 2012-07-06 DIAGNOSIS — F341 Dysthymic disorder: Secondary | ICD-10-CM

## 2012-07-06 DIAGNOSIS — D72819 Decreased white blood cell count, unspecified: Secondary | ICD-10-CM

## 2012-07-06 LAB — COMPREHENSIVE METABOLIC PANEL (CC13)
AST: 17 U/L (ref 5–34)
Albumin: 3.6 g/dL (ref 3.5–5.0)
Alkaline Phosphatase: 50 U/L (ref 40–150)
BUN: 21.7 mg/dL (ref 7.0–26.0)
Calcium: 9.5 mg/dL (ref 8.4–10.4)
Chloride: 107 mEq/L (ref 98–107)
Glucose: 97 mg/dl (ref 70–99)
Potassium: 4.2 mEq/L (ref 3.5–5.1)
Sodium: 138 mEq/L (ref 136–145)
Total Protein: 7.3 g/dL (ref 6.4–8.3)

## 2012-07-06 LAB — CBC WITH DIFFERENTIAL/PLATELET
BASO%: 0.7 % (ref 0.0–2.0)
Basophils Absolute: 0 10*3/uL (ref 0.0–0.1)
EOS%: 2 % (ref 0.0–7.0)
HCT: 27.7 % — ABNORMAL LOW (ref 34.8–46.6)
HGB: 9.6 g/dL — ABNORMAL LOW (ref 11.6–15.9)
LYMPH%: 13.7 % — ABNORMAL LOW (ref 14.0–49.7)
MCH: 33.9 pg (ref 25.1–34.0)
MCHC: 34.6 g/dL (ref 31.5–36.0)
MCV: 97.8 fL (ref 79.5–101.0)
NEUT%: 76.9 % — ABNORMAL HIGH (ref 38.4–76.8)
Platelets: 207 10*3/uL (ref 145–400)

## 2012-07-06 LAB — MORPHOLOGY

## 2012-07-06 NOTE — Progress Notes (Signed)
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    CURRENT THERAPY:  Watchful observation.   INTERVAL HISTORY: Nicole Meyer 72 y.o. female returns for regular follow up with her husband.  She still reports fatigue. This is been going for many years.  It has not been getting worse lately. She denies anorexia, visible source of bleeding. She has stopped taking oral iron. She has lost about 30 lbs. She has surgery for a hernia repair earlier this year and thinks weight loss was due to this.  Patient denies fever, headache, visual changes, confusion, drenching night sweats, palpable lymph node swelling, mucositis, odynophagia, dysphagia, nausea vomiting, jaundice, chest pain, palpitation, shortness of breath, dyspnea on exertion, productive cough, gum bleeding, epistaxis, hematemesis, hemoptysis, abdominal pain, abdominal swelling, early satiety, melena, hematochezia, hematuria, skin rash, spontaneous bleeding, joint swelling, joint pain, heat or cold intolerance, bowel bladder incontinence, back pain, focal motor weakness, paresthesia, depression, suicidal or homicidal ideation, feeling hopelessness.   Past Medical History  Diagnosis Date  . Leukocytopenia   . HTN (hypertension)   . Asthma   . IBS (irritable bowel syndrome)   . Anxiety   . Depression   . Chronic kidney disease (CKD), stage III (moderate)   . Hypercalcemia   . Fatigue   . Lymphopenia 08/23/2011  . COPD (chronic obstructive pulmonary disease)   . Renal insufficiency   . UTI (urinary tract infection) 12/26/2011  . Syncope and collapse 12/24/2011    "loss of consciousness for 3-4 min" (12/26/2011)  . Hypercholesteremia     "above borderline; I don't take RX for it" (12/26/2011)  . H/O hiatal hernia   . GERD  (gastroesophageal reflux disease)   . Arthritis     "knees and back" (12/26/2011)  . Osteoporosis   . 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 Iron deficiency anemia     [facility]    Past Surgical History  Procedure Laterality Date  . Hernia repair  1970's    "stomach" (12/26/2011)  . Abdominal hysterectomy  ~ 1980  . Esophagogastroduodenoscopy  12/26/2011    Procedure: ESOPHAGOGASTRODUODENOSCOPY (EGD);  Surgeon: Louis Meckel, MD;  Location: [facility] ENDOSCOPY;  Service: Endoscopy;  Laterality: N/A;  . Breast surgery  04-06-12    rt. lumpectomy_benign  . Tubal ligation    . Laparoscopic nissen fundoplication N/A 04/08/2012    Procedure: Laparoscopic Reduction and Repair of Paraesophagel Hiatal Hernia with Nissen;  Surgeon: Ardeth Sportsman, MD;  Location: [facility] ORS;  Service: General;  Laterality: N/A;  Laparoscopic Reduction and Repair of Paraesophagel Hiatal Hernia with Nissen    Current Outpatient Prescriptions  Medication Sig Dispense Refill  . ALPRAZolam (XANAX) 0.5 MG tablet Take 0.5 mg by mouth 3 (three) times daily as needed for sleep.      . Glucosamine 500 MG TABS Take 1 tablet by mouth 2 (two) times daily.      [name] HYDROcodone-acetaminophen (NORCO/VICODIN) 5-325 MG per tablet Take 1 tablet by mouth 2 (two) times daily.      . Nutritional Supplements (ESTROVEN PO) Take 1 tablet by mouth daily.      [name] omeprazole (PRILOSEC) 20 MG capsule Take 20 mg by mouth daily.      [name] albuterol (PROVENTIL) (2.5 MG/3ML) 0.083% nebulizer solution Take  2.5 mg by nebulization daily. Wheezing and shortness of breath      . aspirin 81 MG tablet Take 81 mg by mouth daily.      . beta carotene w/minerals (OCUVITE) tablet Take 1 tablet by mouth daily.      . Calcium Carb-Cholecalciferol (CALCIUM + D3) 600-200 MG-UNIT TABS Take 2 tablets by mouth daily.       [name] lisinopril-hydrochlorothiazide (PRINZIDE,ZESTORETIC) 20-12.5 MG per tablet Take 1 tablet by mouth  every morning.       . Multiple Vitamin (MULTIVITAMIN) tablet Take 1 tablet by mouth daily.       No current facility-administered medications for this visit.    ALLERGIES:  is allergic to penicillins; brovana; and paxil.  REVIEW OF SYSTEMS:  The rest of the 14-point review of system was negative.   Filed Vitals:   07/06/12 1029  BP: 164/73  Pulse: 69  Temp: 97.2 F (36.2 C)  Resp: 18   Wt Readings from Last 3 Encounters:  07/06/12 167 lb 12.8 oz (76.114 kg)  06/29/12 170 lb 3.2 oz (77.202 kg)  05/27/12 174 lb (78.926 kg)   ECOG Performance status: 1  PHYSICAL EXAMINATION:   General:  Well nourished woman, in no acute distress.  Eyes:  no scleral icterus.  ENT:  There were no oropharyngeal lesions.  Neck was without thyromegaly.  Lymphatics:  Negative cervical, supraclavicular or axillary adenopathy.  Respiratory: lungs were clear bilaterally without wheezing or crackles.  Cardiovascular:  Regular rate and rhythm, S1/S2, without murmur, rub or gallop.  There was no pedal edema.  GI:  abdomen was soft, flat, nontender, nondistended, without organomegaly.  Muscoloskeletal:  no spinal tenderness of palpation of vertebral spine.  Skin exam was without echymosis, petichae.  Neuro exam was nonfocal.  Patient was able to get on and off exam table without assistance.  Gait was normal.  Patient was alerted and oriented.  Attention was good.   Language was appropriate.  Mood was normal without depression.  Speech was not pressured.  Thought content was not tangential.      LABORATORY/RADIOLOGY DATA:  Lab Results  Component Value Date   WBC 5.1 07/06/2012   HGB 9.6* 07/06/2012   HCT 27.7* 07/06/2012   PLT 207 07/06/2012   GLUCOSE 108* 04/10/2012   CHOL  Value: 188        ATP III CLASSIFICATION:  <200     mg/dL   Desirable  191-478  mg/dL   Borderline High  >=295    mg/dL   High        08/08/3084   TRIG 133 04/05/2008   HDL 38* 04/05/2008   LDLCALC  Value: 123        Total Cholesterol/HDL:CHD  Risk Coronary Heart Disease Risk Table                     Men   Women  1/2 Average Risk   3.4   3.3  Average Risk       5.0   4.4  2 X Average Risk   9.6   7.1  3 X Average Risk  23.4   11.0        Use the calculated Patient Ratio above and the CHD Risk Table to determine the patient's CHD Risk.        ATP III CLASSIFICATION (LDL):  <100     mg/dL   Optimal  578-469  mg/dL   Near or Above  Optimal  130-159  mg/dL   Borderline  409-811  mg/dL   High  >914     mg/dL   Very High* 7/82/9562   ALKPHOS 64 12/24/2011   ALT 24 12/24/2011   AST 23 12/24/2011   NA 133* 04/10/2012   K 4.5 04/10/2012   CL 96 04/10/2012   CREATININE 0.87 04/10/2012   BUN 15 04/10/2012   CO2 28 04/10/2012   INR 1.01 09/03/2011   HGBA1C  Value: 5.3 (NOTE)   The ADA recommends the following therapeutic goal for glycemic   control related to Hgb A1C measurement:   Goal of Therapy:   < 7.0% Hgb A1C   Reference: American Diabetes Association: Clinical Practice   Recommendations 2008, Diabetes Care,  2008, 31:(Suppl 1). 04/05/2008    ASSESSMENT AND PLAN:    1. COPD: From past smoking, none now. Her COPD is well controlled on inhalers per PCP.  2. Depression/axiety: Off Celexa with stable mood.  3. Hypertension:  She is on Lisinopril/HCTZ per PCP.  4. Leukopenia/lymphopenia: Very slight without neutropenia and recurrent infection. HIV testing was negative in 08/2011.  Her WBC has normalized.  5.   Anemia:  Unclear reason.  Extensive work up was negative including bone marrow biopsy.  Hgb today is stable without intervention.  Diagnosis of exclusion is anemia of dyserythropoiesis ("weakened bone marrow from aging process" as opposed to cancer).  Even though her ferritin was not low and there was no sign of iron deficiency anemia on bone marrow biopsy, her Hgb improved on oral iron. I advised her to resume oral iron as tolerated this time. - Recommendation:  Observation.  Follow up with blood test at the [facility] every 2  months.  We may consider transfusion when Hgb <7.  We may consider Aranesp injection when patient requires frequent blood transfusion.   Return visit in about 8 months.     The length of time of the face-to-face encounter was 15 minutes. More than 50% of time was spent counseling and coordination of care.

## 2012-09-01 ENCOUNTER — Other Ambulatory Visit (HOSPITAL_BASED_OUTPATIENT_CLINIC_OR_DEPARTMENT_OTHER): Payer: Medicare PPO | Admitting: Lab

## 2012-09-01 DIAGNOSIS — D649 Anemia, unspecified: Secondary | ICD-10-CM

## 2012-09-01 LAB — CBC WITH DIFFERENTIAL/PLATELET
EOS%: 3.7 % (ref 0.0–7.0)
HGB: 9.5 g/dL — ABNORMAL LOW (ref 11.6–15.9)
MCH: 34.5 pg — ABNORMAL HIGH (ref 25.1–34.0)
MCV: 100.4 fL (ref 79.5–101.0)
MONO%: 9.4 % (ref 0.0–14.0)
NEUT#: 3.1 10*3/uL (ref 1.5–6.5)
RBC: 2.77 10*6/uL — ABNORMAL LOW (ref 3.70–5.45)
RDW: 13.5 % (ref 11.2–14.5)
lymph#: 0.5 10*3/uL — ABNORMAL LOW (ref 0.9–3.3)

## 2012-09-02 ENCOUNTER — Telehealth: Payer: Self-pay | Admitting: *Deleted

## 2012-09-02 NOTE — Telephone Encounter (Signed)
Informed pt of her Hgb stable and to keep next lab in Sept. As scheduled.  She verbalized understanding.

## 2012-09-02 NOTE — Telephone Encounter (Signed)
Message copied by Wende Mott on Wed Sep 02, 2012  4:21 PM ------      Message from: Nicole Meyer      Created: Wed Sep 02, 2012 12:30 PM       Please call pt. Hgb remains stable. Continue observation. ------

## 2012-09-23 ENCOUNTER — Other Ambulatory Visit: Payer: Self-pay

## 2012-11-02 ENCOUNTER — Telehealth: Payer: Self-pay | Admitting: Hematology and Oncology

## 2012-11-02 NOTE — Telephone Encounter (Signed)
Returned pt's call re cx'ing 9/16 lb. Per pt she has appt w/primary in October and primary will draw all lbs. Per pt she will have results sent to our office.

## 2012-11-03 ENCOUNTER — Other Ambulatory Visit: Payer: Medicare PPO

## 2012-12-24 ENCOUNTER — Other Ambulatory Visit: Payer: Self-pay

## 2013-01-01 ENCOUNTER — Other Ambulatory Visit (HOSPITAL_COMMUNITY): Payer: Self-pay | Admitting: Gastroenterology

## 2013-01-01 DIAGNOSIS — R112 Nausea with vomiting, unspecified: Secondary | ICD-10-CM

## 2013-01-03 ENCOUNTER — Emergency Department (HOSPITAL_COMMUNITY): Payer: Medicare PPO

## 2013-01-03 ENCOUNTER — Inpatient Hospital Stay (HOSPITAL_COMMUNITY)
Admission: EM | Admit: 2013-01-03 | Discharge: 2013-01-08 | DRG: 689 | Disposition: A | Discharge status: Home Health | Payer: Medicare PPO | Attending: Internal Medicine | Admitting: Internal Medicine

## 2013-01-03 ENCOUNTER — Encounter (HOSPITAL_COMMUNITY): Payer: Self-pay | Admitting: Emergency Medicine

## 2013-01-03 DIAGNOSIS — E7800 Pure hypercholesterolemia, unspecified: Secondary | ICD-10-CM | POA: Diagnosis present

## 2013-01-03 DIAGNOSIS — F411 Generalized anxiety disorder: Secondary | ICD-10-CM | POA: Diagnosis present

## 2013-01-03 DIAGNOSIS — I4949 Other premature depolarization: Secondary | ICD-10-CM | POA: Diagnosis present

## 2013-01-03 DIAGNOSIS — J449 Chronic obstructive pulmonary disease, unspecified: Secondary | ICD-10-CM | POA: Diagnosis present

## 2013-01-03 DIAGNOSIS — A499 Bacterial infection, unspecified: Secondary | ICD-10-CM | POA: Diagnosis present

## 2013-01-03 DIAGNOSIS — K8020 Calculus of gallbladder without cholecystitis without obstruction: Secondary | ICD-10-CM | POA: Diagnosis present

## 2013-01-03 DIAGNOSIS — R7881 Bacteremia: Secondary | ICD-10-CM | POA: Diagnosis present

## 2013-01-03 DIAGNOSIS — F329 Major depressive disorder, single episode, unspecified: Secondary | ICD-10-CM | POA: Diagnosis present

## 2013-01-03 DIAGNOSIS — R509 Fever, unspecified: Secondary | ICD-10-CM | POA: Diagnosis present

## 2013-01-03 DIAGNOSIS — E871 Hypo-osmolality and hyponatremia: Secondary | ICD-10-CM | POA: Diagnosis present

## 2013-01-03 DIAGNOSIS — R197 Diarrhea, unspecified: Secondary | ICD-10-CM

## 2013-01-03 DIAGNOSIS — D509 Iron deficiency anemia, unspecified: Secondary | ICD-10-CM | POA: Diagnosis present

## 2013-01-03 DIAGNOSIS — A419 Sepsis, unspecified organism: Secondary | ICD-10-CM | POA: Diagnosis present

## 2013-01-03 DIAGNOSIS — N12 Tubulo-interstitial nephritis, not specified as acute or chronic: Principal | ICD-10-CM | POA: Diagnosis present

## 2013-01-03 DIAGNOSIS — Z8249 Family history of ischemic heart disease and other diseases of the circulatory system: Secondary | ICD-10-CM

## 2013-01-03 DIAGNOSIS — Z880 Allergy status to penicillin: Secondary | ICD-10-CM

## 2013-01-03 DIAGNOSIS — N179 Acute kidney failure, unspecified: Secondary | ICD-10-CM | POA: Diagnosis present

## 2013-01-03 DIAGNOSIS — E875 Hyperkalemia: Secondary | ICD-10-CM | POA: Diagnosis present

## 2013-01-03 DIAGNOSIS — D6489 Other specified anemias: Secondary | ICD-10-CM | POA: Diagnosis present

## 2013-01-03 DIAGNOSIS — I129 Hypertensive chronic kidney disease with stage 1 through stage 4 chronic kidney disease, or unspecified chronic kidney disease: Secondary | ICD-10-CM | POA: Diagnosis present

## 2013-01-03 DIAGNOSIS — I959 Hypotension, unspecified: Secondary | ICD-10-CM | POA: Diagnosis present

## 2013-01-03 DIAGNOSIS — D589 Hereditary hemolytic anemia, unspecified: Secondary | ICD-10-CM | POA: Diagnosis present

## 2013-01-03 DIAGNOSIS — Z79899 Other long term (current) drug therapy: Secondary | ICD-10-CM

## 2013-01-03 DIAGNOSIS — Z87891 Personal history of nicotine dependence: Secondary | ICD-10-CM

## 2013-01-03 DIAGNOSIS — R109 Unspecified abdominal pain: Secondary | ICD-10-CM

## 2013-01-03 DIAGNOSIS — D696 Thrombocytopenia, unspecified: Secondary | ICD-10-CM | POA: Diagnosis present

## 2013-01-03 DIAGNOSIS — M810 Age-related osteoporosis without current pathological fracture: Secondary | ICD-10-CM | POA: Diagnosis present

## 2013-01-03 DIAGNOSIS — Z833 Family history of diabetes mellitus: Secondary | ICD-10-CM

## 2013-01-03 DIAGNOSIS — F3289 Other specified depressive episodes: Secondary | ICD-10-CM | POA: Diagnosis present

## 2013-01-03 DIAGNOSIS — N183 Chronic kidney disease, stage 3 unspecified: Secondary | ICD-10-CM | POA: Diagnosis present

## 2013-01-03 DIAGNOSIS — K219 Gastro-esophageal reflux disease without esophagitis: Secondary | ICD-10-CM | POA: Diagnosis present

## 2013-01-03 DIAGNOSIS — J4489 Other specified chronic obstructive pulmonary disease: Secondary | ICD-10-CM | POA: Diagnosis present

## 2013-01-03 DIAGNOSIS — Z7982 Long term (current) use of aspirin: Secondary | ICD-10-CM

## 2013-01-03 DIAGNOSIS — D649 Anemia, unspecified: Secondary | ICD-10-CM

## 2013-01-03 LAB — COMPREHENSIVE METABOLIC PANEL
ALT: 13 U/L (ref 0–35)
AST: 15 U/L (ref 0–37)
Albumin: 3 g/dL — ABNORMAL LOW (ref 3.5–5.2)
Alkaline Phosphatase: 53 U/L (ref 39–117)
BUN: 37 mg/dL — ABNORMAL HIGH (ref 6–23)
CO2: 19 mEq/L (ref 19–32)
Calcium: 8.9 mg/dL (ref 8.4–10.5)
Chloride: 105 mEq/L (ref 96–112)
Creatinine, Ser: 1.94 mg/dL — ABNORMAL HIGH (ref 0.50–1.10)
GFR calc Af Amer: 28 mL/min — ABNORMAL LOW (ref >90)
GFR calc non Af Amer: 24 mL/min — ABNORMAL LOW (ref >90)
Glucose, Bld: 109 mg/dL — ABNORMAL HIGH (ref 70–99)
Potassium: 3.8 mEq/L (ref 3.5–5.1)
Sodium: 134 mEq/L — ABNORMAL LOW (ref 135–145)
Total Bilirubin: 1.4 mg/dL — ABNORMAL HIGH (ref 0.3–1.2)
Total Protein: 5.9 g/dL — ABNORMAL LOW (ref 6.0–8.3)

## 2013-01-03 LAB — URINALYSIS, ROUTINE W REFLEX MICROSCOPIC
Ketones, ur: NEGATIVE mg/dL
Nitrite: NEGATIVE
Protein, ur: NEGATIVE mg/dL
Urobilinogen, UA: 0.2 mg/dL (ref 0.0–1.0)

## 2013-01-03 LAB — LACTIC ACID, PLASMA: Lactic Acid, Venous: 2.4 mmol/L — ABNORMAL HIGH (ref 0.5–2.2)

## 2013-01-03 LAB — CBC WITH DIFFERENTIAL/PLATELET
Basophils Absolute: 0 10*3/uL (ref 0.0–0.1)
Basophils Relative: 0 % (ref 0–1)
Hemoglobin: 9.7 g/dL — ABNORMAL LOW (ref 12.0–15.0)
Lymphocytes Relative: 4 % — ABNORMAL LOW (ref 12–46)
MCH: 33.6 pg (ref 26.0–34.0)
MCHC: 34.6 g/dL (ref 30.0–36.0)
Monocytes Absolute: 0.4 10*3/uL (ref 0.1–1.0)
Neutro Abs: 10.5 10*3/uL — ABNORMAL HIGH (ref 1.7–7.7)
Neutrophils Relative %: 93 % — ABNORMAL HIGH (ref 43–77)
Platelets: 164 10*3/uL (ref 150–400)
RBC: 2.89 MIL/uL — ABNORMAL LOW (ref 3.87–5.11)
RDW: 13.7 % (ref 11.5–15.5)
WBC: 11.4 10*3/uL — ABNORMAL HIGH (ref 4.0–10.5)

## 2013-01-03 LAB — LIPASE, BLOOD: Lipase: 19 U/L (ref 11–59)

## 2013-01-03 LAB — URINE MICROSCOPIC-ADD ON

## 2013-01-03 LAB — OCCULT BLOOD, POC DEVICE: Fecal Occult Bld: NEGATIVE

## 2013-01-03 MED ORDER — CEFTRIAXONE SODIUM 1 G IJ SOLR
1.0000 g | Freq: Once | INTRAMUSCULAR | Status: AC
  Administered 2013-01-04: 1 g via INTRAVENOUS
  Filled 2013-01-03: qty 10

## 2013-01-03 MED ORDER — IOHEXOL 300 MG/ML  SOLN
50.0000 mL | Freq: Once | INTRAMUSCULAR | Status: AC | PRN
  Administered 2013-01-03: 50 mL via ORAL

## 2013-01-03 MED ORDER — SODIUM CHLORIDE 0.9 % IV BOLUS (SEPSIS)
1000.0000 mL | Freq: Once | INTRAVENOUS | Status: AC
  Administered 2013-01-03: 1000 mL via INTRAVENOUS

## 2013-01-03 MED ORDER — MORPHINE SULFATE 4 MG/ML IJ SOLN
4.0000 mg | Freq: Once | INTRAMUSCULAR | Status: AC
  Administered 2013-01-03: 4 mg via INTRAVENOUS
  Filled 2013-01-03: qty 1

## 2013-01-03 MED ORDER — HYDROMORPHONE HCL PF 2 MG/ML IJ SOLN: 2.0000 mg | INTRAMUSCULAR | Status: DC | PRN

## 2013-01-03 MED ORDER — HYDROMORPHONE HCL PF 1 MG/ML IJ SOLN: 1.0000 mg | Freq: Once | INTRAMUSCULAR | Status: DC

## 2013-01-03 MED ORDER — SODIUM CHLORIDE 0.9 % IV BOLUS (SEPSIS)
500.0000 mL | Freq: Once | INTRAVENOUS | Status: AC
  Administered 2013-01-03: 500 mL via INTRAVENOUS

## 2013-01-03 MED ORDER — SODIUM CHLORIDE 0.9 % IV BOLUS (SEPSIS)
500.0000 mL | Freq: Once | INTRAVENOUS | Status: AC
  Administered 2013-01-04: 500 mL via INTRAVENOUS

## 2013-01-03 MED ORDER — ONDANSETRON HCL 4 MG/2ML IJ SOLN: 4.0000 mg | Freq: Three times a day (TID) | INTRAMUSCULAR | Status: DC | PRN

## 2013-01-03 MED ORDER — SODIUM CHLORIDE 0.9 % IV BOLUS (SEPSIS): 500.0000 mL | Freq: Once | INTRAVENOUS | Status: DC

## 2013-01-03 MED ORDER — ONDANSETRON HCL 4 MG/2ML IJ SOLN
4.0000 mg | Freq: Once | INTRAMUSCULAR | Status: AC
  Administered 2013-01-03: 4 mg via INTRAVENOUS
  Filled 2013-01-03: qty 2

## 2013-01-03 NOTE — ED Notes (Signed)
Patient transported to CT 

## 2013-01-03 NOTE — ED Provider Notes (Signed)
CSN: [id number]     Arrival date & time 01/03/13  1744 History   First MD Initiated Contact with Patient 01/03/13 1753     Chief Complaint  Patient presents with  . Abdominal Pain  . Diarrhea   (Consider location/radiation/quality/duration/timing/severity/associated sxs/prior Treatment) HPI  Nicole Meyer is a 72 y.o. female complaining of abdominal pain since last night.  She describes the pain as sharp at epigastric area and rates 8/10. She is also complaining of 5x episodes (non-bloody, non-melanotic) watery diarrhea and nausea since this morning. States this is not typical of prior IBS exacerbations. He has had hx of paraesophgeal hiatal hernia repair February 2014. She said that she has had issues eating solid foods since the time of her surgery. She also said that she cannot vomit since the surgery.  She also has intermittent nausea since then.  She denies fever, chills, HA, dizziness, CP or urinary problem. She has an appointment with Dr. Madilyn Fireman December 2014.  Past Medical History  Diagnosis Date  . Leukocytopenia   . HTN (hypertension)   . Asthma   . IBS (irritable bowel syndrome)   . Anxiety   . Depression   . Chronic kidney disease (CKD), stage III (moderate)   . Hypercalcemia   . Fatigue   . Lymphopenia 08/23/2011  . COPD (chronic obstructive pulmonary disease)   . Renal insufficiency   . UTI (urinary tract infection) 12/26/2011  . Syncope and collapse 12/24/2011    "loss of consciousness for 3-4 min" (12/26/2011)  . Hypercholesteremia     "above borderline; I don't take RX for it" (12/26/2011)  . H/O hiatal hernia   . GERD (gastroesophageal reflux disease)   . Arthritis     "knees and back" (12/26/2011)  . Osteoporosis   . 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 Iron deficiency anemia     [facility]   Past Surgical History  Procedure Laterality Date  . Hernia repair  1970's    "stomach" (12/26/2011)  . Abdominal  hysterectomy  ~ 1980  . Esophagogastroduodenoscopy  12/26/2011    Procedure: ESOPHAGOGASTRODUODENOSCOPY (EGD);  Surgeon: Louis Meckel, MD;  Location: [facility] ENDOSCOPY;  Service: Endoscopy;  Laterality: N/A;  . Breast surgery  04-06-12    rt. lumpectomy_benign  . Tubal ligation    . Laparoscopic nissen fundoplication N/A 04/08/2012    Procedure: Laparoscopic Reduction and Repair of Paraesophagel Hiatal Hernia with Nissen;  Surgeon: Ardeth Sportsman, MD;  Location: [facility] ORS;  Service: General;  Laterality: N/A;  Laparoscopic Reduction and Repair of Paraesophagel Hiatal Hernia with Nissen   Family History  Problem Relation Age of Onset  . Diabetes Father   . Heart disease Father   . Cancer Maternal Aunt     Breast cancer   History  Substance Use Topics  . Smoking status: Former Smoker -- 1.00 packs/day for 40 years    Types: Cigarettes    Quit date: 02/19/1999  . Smokeless tobacco: Never Used  . Alcohol Use: No   OB History   Grav Para Term Preterm Abortions TAB SAB Ect Mult Living                 Review of Systems 10 systems reviewed and found to be negative, except as noted in the HPI  Allergies  Penicillins; Brovana; and Paxil  Home Medications   Current Outpatient Rx  Name  Route  Sig  Dispense  Refill  .  albuterol (PROVENTIL) (2.5 MG/3ML) 0.083% nebulizer solution   Nebulization   Take 2.5 mg by nebulization every 6 (six) hours as needed for wheezing or shortness of breath. Wheezing and shortness of breath         . ALPRAZolam (XANAX) 0.5 MG tablet   Oral   Take 0.5 mg by mouth at bedtime as needed for anxiety or sleep.          [name] aspirin EC 81 MG tablet   Oral   Take 81 mg by mouth daily.         . beta carotene w/minerals (OCUVITE) tablet   Oral   Take 1 tablet by mouth daily.         . calcium-vitamin D (OSCAL WITH D) 500-200 MG-UNIT per tablet   Oral   Take 1 tablet by mouth 2 (two) times daily.         . DULoxetine (CYMBALTA) 30 MG capsule   Oral    Take 30 mg by mouth 2 (two) times daily.         [name] HYDROcodone-acetaminophen (NORCO/VICODIN) 5-325 MG per tablet   Oral   Take 1 tablet by mouth 2 (two) times daily.         [name] lisinopril-hydrochlorothiazide (PRINZIDE,ZESTORETIC) 20-12.5 MG per tablet   Oral   Take 1 tablet by mouth every morning.          . Multiple Vitamin (MULTIVITAMIN) tablet   Oral   Take 1 tablet by mouth daily.         . Nutritional Supplements (ESTROVEN PO)   Oral   Take 1 tablet by mouth daily.         . ranitidine (ZANTAC) 150 MG tablet   Oral   Take 150 mg by mouth daily.          BP 91/42  Pulse 79  Temp(Src) 97.9 F (36.6 C) (Oral)  Resp 22  SpO2 98% Physical Exam  Nursing note and vitals reviewed. Constitutional: She is oriented to person, place, and time. She appears well-developed and well-nourished. No distress.  HENT:  Head: Normocephalic.  Dry MM  Eyes: Conjunctivae and EOM are normal. Pupils are equal, round, and reactive to light.  Cardiovascular: Regular rhythm and intact distal pulses.   Mild tachycardia  Pulmonary/Chest: Effort normal and breath sounds normal. No stridor. No respiratory distress. She has no wheezes. She has no rales. She exhibits no tenderness.  Abdominal: Soft. Bowel sounds are normal. She exhibits no distension and no mass. There is tenderness. There is no rebound and no guarding.  Diffusely tender to palpation in all quadrants with voluntary guarding and no rebound  Musculoskeletal: Normal range of motion.  Neurological: She is alert and oriented to person, place, and time.  Psychiatric: She has a normal mood and affect.    ED Course  Procedures (including critical care time) Labs Review Labs Reviewed  LACTIC ACID, PLASMA - Abnormal; Notable for the following:    Lactic Acid, Venous 2.4 (*)    All other components within normal limits  URINALYSIS, ROUTINE W REFLEX MICROSCOPIC - Abnormal; Notable for the following:    APPearance CLOUDY (*)     Hgb urine dipstick TRACE (*)    Leukocytes, UA LARGE (*)    All other components within normal limits  COMPREHENSIVE METABOLIC PANEL - Abnormal; Notable for the following:    Sodium 134 (*)    Glucose, Bld 109 (*)    BUN 37 (*)  Creatinine, Ser 1.94 (*)    Total Protein 5.9 (*)    Albumin 3.0 (*)    Total Bilirubin 1.4 (*)    GFR calc non Af Amer 24 (*)    GFR calc Af Amer 28 (*)    All other components within normal limits  CBC WITH DIFFERENTIAL - Abnormal; Notable for the following:    WBC 11.4 (*)    RBC 2.89 (*)    Hemoglobin 9.7 (*)    HCT 28.0 (*)    Neutrophils Relative % 93 (*)    Neutro Abs 10.5 (*)    Lymphocytes Relative 4 (*)    Lymphs Abs 0.4 (*)    All other components within normal limits  URINE MICROSCOPIC-ADD ON - Abnormal; Notable for the following:    Squamous Epithelial / LPF MANY (*)    Bacteria, UA MANY (*)    All other components within normal limits  URINE CULTURE  LIPASE, BLOOD  GI PATHOGEN PANEL BY PCR, STOOL  CBC WITH DIFFERENTIAL  OCCULT BLOOD, POC DEVICE   Imaging Review Ct Abdomen Pelvis Wo Contrast  01/03/2013   CLINICAL DATA:  Abdominal pain  EXAM: CT ABDOMEN AND PELVIS WITHOUT CONTRAST  TECHNIQUE: Multidetector CT imaging of the abdomen and pelvis was performed following the standard protocol without intravenous contrast.  COMPARISON:  12/24/2011  FINDINGS: BODY WALL: Unremarkable.  LOWER CHEST: Diffuse coronary artery atherosclerosis. Circumferential distal esophageal thickening, similar to prior. Interval reduction of hiatal hernia, with Nissen fundoplication. Mild subpleural lung reticulation.  ABDOMEN/PELVIS:  Liver: Granulomatous changes.  Biliary: No evidence of biliary obstruction or stone.  Pancreas: Unremarkable.  Spleen: Unremarkable.  Adrenals: Unremarkable.  Kidneys and ureters: Asymmetric left perinephric edema, with thickening of the perirenal fascia, new from prior. No stone or hydronephrosis. No cyst on previous imaging.   Bladder: Mild distention. Gas in the urinary bladder, often from urinalysis.  Reproductive: Hysterectomy.  Probable salpingo oophorectomies.  Bowel: No obstruction. Colonic diverticulosis. No pericecal inflammation.  Retroperitoneum: No mass or adenopathy.  Peritoneum: No free fluid or gas.  Vascular: No acute abnormality.  OSSEOUS: No acute abnormalities.  IMPRESSION: Left perinephric edema, usually from pyelonephritis or recently passed ureteral stone.   Electronically Signed   By: Tiburcio Pea [name]   On: 01/03/2013 21:31    EKG Interpretation   None       MDM   1. Pyelonephritis   2. Diarrhea   3. Abdominal pain   4. ARF (acute renal failure)      Filed Vitals:   01/03/13 2107 01/03/13 2130 01/03/13 2200 01/03/13 2205  BP: 94/44 97/46 91/42    Pulse: 86 83 79   Temp:    97.9 F (36.6 C)  TempSrc:    Oral  Resp:      SpO2: 96% 97% 98%      Nicole Meyer is a 72 y.o. female with past medical history significant for IBS complaining of significant diffuse abdominal pain associated with 5 episodes of watery diarrhea starting this a.m. Patient denies fever however she is borderline febrile here with a temperature of 100.1 and she is tachycardic as well in the 130s. Patient is diffusely tender to palpation of the abdomen, however there is no guarding or rebound.   Significant delays in obtaining both urine and blood work on this patient.  Patient has a very mildly elevated lactic acid at 2.4 here stool guaiac is negative. Patient has a mild leukocytosis of 11.4. Her creatinine is elevated at 1.94  she has always had normal creatinine his last one was taken in May of this year. BUN is elevated likely secondary to dehydration. Patient's pressure is soft with systolic in the low 90s. This is significant especially compared to her initial systolic of 160. Patient is being bolused she is received the 1 L I will write her for another liter bolus.  CT abdomen pelvis shows left  perinephric edema consistent with pyelonephritis. Urinalysis is also consistent with infection. I will start her on a gram of Rocephin. Due to her age, kidney injury and concomitant diarrhea she merits inpatient treatment.  Pt will be admitted to Dr. Benjamine Mola to a step down bed.   Medications  sodium chloride 0.9 % bolus 500 mL (0 mLs Intravenous Stopped 01/03/13 2222)  HYDROmorphone (DILAUDID) injection 1 mg (1 mg Intravenous Not Given 01/03/13 2222)  cefTRIAXone (ROCEPHIN) 1 g in dextrose 5 % 50 mL IVPB (not administered)  sodium chloride 0.9 % bolus 500 mL (0 mLs Intravenous Stopped 01/03/13 1850)  morphine 4 MG/ML injection 4 mg (4 mg Intravenous Given 01/03/13 1822)  ondansetron (ZOFRAN) injection 4 mg (4 mg Intravenous Given 01/03/13 1822)  iohexol (OMNIPAQUE) 300 MG/ML solution 50 mL (50 mLs Oral Contrast Given 01/03/13 1820)  sodium chloride 0.9 % bolus 500 mL (0 mLs Intravenous Stopped 01/03/13 2134)  sodium chloride 0.9 % bolus 1,000 mL (1,000 mLs Intravenous New Bag/Given 01/03/13 2246)    Note: Portions of this report may have been transcribed using voice recognition software. Every effort was made to ensure accuracy; however, inadvertent computerized transcription errors may be present      Wynetta Emery, PA-C 01/04/13 0003

## 2013-01-03 NOTE — ED Notes (Addendum)
Per EMS: pt c/o abd pain and diarrhea x 2 days. No vomiting but nauseous. 200 mg Motrin in route for fever.

## 2013-01-03 NOTE — ED Notes (Signed)
CT notified pt completed CM and is ready for scan after labs result

## 2013-01-04 DIAGNOSIS — N179 Acute kidney failure, unspecified: Secondary | ICD-10-CM

## 2013-01-04 DIAGNOSIS — N12 Tubulo-interstitial nephritis, not specified as acute or chronic: Principal | ICD-10-CM

## 2013-01-04 DIAGNOSIS — R109 Unspecified abdominal pain: Secondary | ICD-10-CM

## 2013-01-04 DIAGNOSIS — D649 Anemia, unspecified: Secondary | ICD-10-CM

## 2013-01-04 DIAGNOSIS — R509 Fever, unspecified: Secondary | ICD-10-CM | POA: Diagnosis present

## 2013-01-04 DIAGNOSIS — I959 Hypotension, unspecified: Secondary | ICD-10-CM | POA: Diagnosis present

## 2013-01-04 LAB — BASIC METABOLIC PANEL
BUN: 38 mg/dL — ABNORMAL HIGH (ref 6–23)
BUN: 33 mg/dL — ABNORMAL HIGH (ref 6–23)
CO2: 18 mEq/L — ABNORMAL LOW (ref 19–32)
CO2: 17 mEq/L — ABNORMAL LOW (ref 19–32)
Chloride: 105 mEq/L (ref 96–112)
Chloride: 109 mEq/L (ref 96–112)
Creatinine, Ser: 1.49 mg/dL — ABNORMAL HIGH (ref 0.50–1.10)
GFR calc Af Amer: 30 mL/min — ABNORMAL LOW (ref >90)
GFR calc non Af Amer: 34 mL/min — ABNORMAL LOW (ref >90)
Glucose, Bld: 106 mg/dL — ABNORMAL HIGH (ref 70–99)
Potassium: 5.3 mEq/L — ABNORMAL HIGH (ref 3.5–5.1)
Potassium: 4.5 mEq/L (ref 3.5–5.1)
Sodium: 134 mEq/L — ABNORMAL LOW (ref 135–145)

## 2013-01-04 LAB — CBC
HCT: 30.1 % — ABNORMAL LOW (ref 36.0–46.0)
Hemoglobin: 10.2 g/dL — ABNORMAL LOW (ref 12.0–15.0)
MCH: 32.8 pg (ref 26.0–34.0)
MCHC: 33.9 g/dL (ref 30.0–36.0)
MCV: 96.8 fL (ref 78.0–100.0)
Platelets: 163 10*3/uL (ref 150–400)
WBC: 17.3 10*3/uL — ABNORMAL HIGH (ref 4.0–10.5)

## 2013-01-04 MED ORDER — SODIUM CHLORIDE 0.9 % IV SOLN
INTRAVENOUS | Status: DC
  Administered 2013-01-04: 1000 mL via INTRAVENOUS
  Administered 2013-01-04 – 2013-01-07 (×6): via INTRAVENOUS

## 2013-01-04 MED ORDER — ONDANSETRON HCL 4 MG/2ML IJ SOLN
4.0000 mg | Freq: Four times a day (QID) | INTRAMUSCULAR | Status: DC | PRN
  Administered 2013-01-04 – 2013-01-08 (×4): 4 mg via INTRAVENOUS
  Filled 2013-01-04 (×4): qty 2

## 2013-01-04 MED ORDER — ACETAMINOPHEN 650 MG RE SUPP: 650.0000 mg | Freq: Four times a day (QID) | RECTAL | Status: DC | PRN

## 2013-01-04 MED ORDER — DULOXETINE HCL 30 MG PO CPEP
30.0000 mg | ORAL_CAPSULE | Freq: Two times a day (BID) | ORAL | Status: DC
  Administered 2013-01-04 – 2013-01-08 (×9): 30 mg via ORAL
  Filled 2013-01-04 (×10): qty 1

## 2013-01-04 MED ORDER — SODIUM CHLORIDE 0.9 % IV BOLUS (SEPSIS)
500.0000 mL | Freq: Once | INTRAVENOUS | Status: AC
  Administered 2013-01-04: 500 mL via INTRAVENOUS

## 2013-01-04 MED ORDER — ENSURE PUDDING PO PUDG
1.0000 | ORAL | Status: DC
  Administered 2013-01-04 – 2013-01-07 (×3): 1 via ORAL
  Filled 2013-01-04 (×6): qty 1

## 2013-01-04 MED ORDER — SODIUM CHLORIDE 0.9 % IV BOLUS (SEPSIS)
1000.0000 mL | Freq: Once | INTRAVENOUS | Status: AC
  Administered 2013-01-04: 1000 mL via INTRAVENOUS

## 2013-01-04 MED ORDER — ACETAMINOPHEN 325 MG PO TABS: 650.0000 mg | ORAL_TABLET | Freq: Four times a day (QID) | ORAL | Status: DC | PRN

## 2013-01-04 MED ORDER — DEXTROSE 5 % IV SOLN
1.0000 g | INTRAVENOUS | Status: DC
  Administered 2013-01-04 – 2013-01-05 (×2): 1 g via INTRAVENOUS
  Filled 2013-01-04 (×2): qty 10

## 2013-01-04 MED ORDER — ONDANSETRON HCL 4 MG PO TABS: 4.0000 mg | ORAL_TABLET | Freq: Four times a day (QID) | ORAL | Status: DC | PRN

## 2013-01-04 MED ORDER — ASPIRIN EC 81 MG PO TBEC
81.0000 mg | DELAYED_RELEASE_TABLET | Freq: Every day | ORAL | Status: DC
  Administered 2013-01-04 – 2013-01-05 (×2): 81 mg via ORAL
  Filled 2013-01-04 (×3): qty 1

## 2013-01-04 MED ORDER — HEPARIN SODIUM (PORCINE) 5000 UNIT/ML IJ SOLN
5000.0000 [IU] | Freq: Three times a day (TID) | INTRAMUSCULAR | Status: DC
  Administered 2013-01-04 – 2013-01-05 (×4): 5000 [IU] via SUBCUTANEOUS
  Filled 2013-01-04 (×7): qty 1

## 2013-01-04 MED ORDER — FAMOTIDINE 20 MG PO TABS
20.0000 mg | ORAL_TABLET | Freq: Two times a day (BID) | ORAL | Status: DC
  Administered 2013-01-04 – 2013-01-05 (×4): 20 mg via ORAL
  Filled 2013-01-04 (×6): qty 1

## 2013-01-04 MED ORDER — ALPRAZOLAM 0.5 MG PO TABS
0.5000 mg | ORAL_TABLET | Freq: Every evening | ORAL | Status: DC | PRN
  Administered 2013-01-04 (×2): 0.5 mg via ORAL
  Filled 2013-01-04 (×2): qty 1

## 2013-01-04 MED ORDER — SODIUM CHLORIDE 0.9 % IJ SOLN
3.0000 mL | Freq: Two times a day (BID) | INTRAMUSCULAR | Status: DC
  Administered 2013-01-04 – 2013-01-05 (×2): 3 mL via INTRAVENOUS

## 2013-01-04 MED ORDER — MORPHINE SULFATE 2 MG/ML IJ SOLN
2.0000 mg | INTRAMUSCULAR | Status: DC | PRN
  Administered 2013-01-04 – 2013-01-06 (×5): 2 mg via INTRAVENOUS
  Filled 2013-01-04: qty 1
  Filled 2013-01-04: qty 2
  Filled 2013-01-04 (×2): qty 1

## 2013-01-04 MED ORDER — HYDROCODONE-ACETAMINOPHEN 5-325 MG PO TABS
1.0000 | ORAL_TABLET | Freq: Two times a day (BID) | ORAL | Status: DC
  Administered 2013-01-04 – 2013-01-08 (×9): 1 via ORAL
  Filled 2013-01-04 (×9): qty 1

## 2013-01-04 MED ORDER — POTASSIUM CHLORIDE IN NACL 20-0.9 MEQ/L-% IV SOLN
INTRAVENOUS | Status: DC
  Administered 2013-01-04: 02:00:00 via INTRAVENOUS
  Filled 2013-01-04 (×3): qty 1000

## 2013-01-04 MED ORDER — ADULT MULTIVITAMIN W/MINERALS CH
1.0000 | ORAL_TABLET | Freq: Every day | ORAL | Status: DC
  Administered 2013-01-04 – 2013-01-08 (×5): 1 via ORAL
  Filled 2013-01-04 (×5): qty 1

## 2013-01-04 MED ORDER — MORPHINE SULFATE 2 MG/ML IJ SOLN
INTRAMUSCULAR | Status: AC
  Filled 2013-01-04: qty 1

## 2013-01-04 NOTE — H&P (Signed)
[facility] History and Physical  Nicole Meyer ZOX:[id number] DOB: 1941/01/17 DOA: 01/03/2013  Referring physician: er PCP: Thora Lance, MD  Specialists:   Chief Complaint: abd pain   HPI: Nicole Meyer is a 72 y.o. female  Who comes in with abdominal pain since last PM.  Pain in her back as well as epigastric- describes as an 8/10.  She also had an episode of diarrhea.  +nausea.  + fever + dysuria  In the Er, a CT scan was done that showed, Left perinephric edema, usually from pyelonephritis or recently passed ureteral stone.  Urinalysis showed a UTI and patient was given rocephin.  BP was found to be low and 2L of IVF was given with minimal improvement.  Her WBC was slightly elevated and had AKI  Patient was admitted to SDU  Review of Systems: all systems reviewed, negative unless stated above  Past Medical History  Diagnosis Date  . Leukocytopenia   . HTN (hypertension)   . Asthma   . IBS (irritable bowel syndrome)   . Anxiety   . Depression   . Chronic kidney disease (CKD), stage III (moderate)   . Hypercalcemia   . Fatigue   . Lymphopenia 08/23/2011  . COPD (chronic obstructive pulmonary disease)   . Renal insufficiency   . UTI (urinary tract infection) 12/26/2011  . Syncope and collapse 12/24/2011    "loss of consciousness for 3-4 min" (12/26/2011)  . Hypercholesteremia     "above borderline; I don't take RX for it" (12/26/2011)  . H/O hiatal hernia   . GERD (gastroesophageal reflux disease)   . Arthritis     "knees and back" (12/26/2011)  . Osteoporosis   . 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 Iron deficiency anemia     [facility]   Past Surgical History  Procedure Laterality Date  . Hernia repair  1970's    "stomach" (12/26/2011)  . Abdominal hysterectomy  ~ 1980  . Esophagogastroduodenoscopy  12/26/2011    Procedure: ESOPHAGOGASTRODUODENOSCOPY (EGD);  Surgeon: Louis Meckel, MD;  Location:  [facility] ENDOSCOPY;  Service: Endoscopy;  Laterality: N/A;  . Breast surgery  04-06-12    rt. lumpectomy_benign  . Tubal ligation    . Laparoscopic nissen fundoplication N/A 04/08/2012    Procedure: Laparoscopic Reduction and Repair of Paraesophagel Hiatal Hernia with Nissen;  Surgeon: Ardeth Sportsman, MD;  Location: [facility] ORS;  Service: General;  Laterality: N/A;  Laparoscopic Reduction and Repair of Paraesophagel Hiatal Hernia with Nissen   Social History:  reports that she quit smoking about 13 years ago. Her smoking use included Cigarettes. She has a 40 pack-year smoking history. She has never used smokeless tobacco. She reports that she does not drink alcohol or use illicit drugs.   Allergies  Allergen Reactions  . Penicillins Hives  . Brovana [Arformoterol] Other (See Comments)    "makes me nervous"  . Paxil [Paroxetine Hcl] Rash    Family History  Problem Relation Age of Onset  . Diabetes Father   . Heart disease Father   . Cancer Maternal Aunt     Breast cancer    Prior to Admission medications   Medication Sig Start Date End Date Taking? Authorizing Provider  albuterol (PROVENTIL) (2.5 MG/3ML) 0.083% nebulizer solution Take 2.5 mg by nebulization every 6 (six) hours as needed for wheezing or shortness of breath. Wheezing and shortness of breath   Yes Historical Provider, MD  ALPRAZolam (  XANAX) 0.5 MG tablet Take 0.5 mg by mouth at bedtime as needed for anxiety or sleep.    Yes Historical Provider, MD  aspirin EC 81 MG tablet Take 81 mg by mouth daily.   Yes Historical Provider, MD  beta carotene w/minerals (OCUVITE) tablet Take 1 tablet by mouth daily.   Yes Historical Provider, MD  calcium-vitamin D (OSCAL WITH D) 500-200 MG-UNIT per tablet Take 1 tablet by mouth 2 (two) times daily.   Yes Historical Provider, MD  DULoxetine (CYMBALTA) 30 MG capsule Take 30 mg by mouth 2 (two) times daily.   Yes Historical Provider, MD  HYDROcodone-acetaminophen (NORCO/VICODIN) 5-325 MG per tablet  Take 1 tablet by mouth 2 (two) times daily.   Yes Historical Provider, MD  lisinopril-hydrochlorothiazide (PRINZIDE,ZESTORETIC) 20-12.5 MG per tablet Take 1 tablet by mouth every morning.  01/02/12  Yes Historical Provider, MD  Multiple Vitamin (MULTIVITAMIN) tablet Take 1 tablet by mouth daily.   Yes Historical Provider, MD  Nutritional Supplements (ESTROVEN PO) Take 1 tablet by mouth daily.   Yes Historical Provider, MD  ranitidine (ZANTAC) 150 MG tablet Take 150 mg by mouth daily.   Yes Historical Provider, MD   Physical Exam: Filed Vitals:   01/04/13 0006  BP: 89/49  Pulse: 67  Temp:   Resp: 18     General:  A+Ox3, NAD  Eyes: wnk  ENT: dry mucous membranes  Neck: supple  Cardiovascular: tachy  Respiratory: clear, no wheezing  Abdomen: +BS, soft, + CVA tenderness but also tender in most quadrants  Skin: no rashes or lesions  Musculoskeletal: moves all 4 ext  Psychiatric: no focal deficit  Neurologic: CN 2-12 intact  Labs on Admission:  Basic Metabolic Panel:  Recent Labs Lab 01/03/13 2010  NA 134*  K 3.8  CL 105  CO2 19  GLUCOSE 109*  BUN 37*  CREATININE 1.94*  CALCIUM 8.9   Liver Function Tests:  Recent Labs Lab 01/03/13 2010  AST 15  ALT 13  ALKPHOS 53  BILITOT 1.4*  PROT 5.9*  ALBUMIN 3.0*    Recent Labs Lab 01/03/13 2010  LIPASE 19   No results found for this basename: AMMONIA,  in the last 168 hours CBC:  Recent Labs Lab 01/03/13 2010  WBC 11.4*  NEUTROABS 10.5*  HGB 9.7*  HCT 28.0*  MCV 96.9  PLT 164   Cardiac Enzymes: No results found for this basename: CKTOTAL, CKMB, CKMBINDEX, TROPONINI,  in the last 168 hours  BNP (last 3 results) No results found for this basename: PROBNP,  in the last 8760 hours CBG: No results found for this basename: GLUCAP,  in the last 168 hours  Radiological Exams on Admission: Ct Abdomen Pelvis Wo Contrast  01/03/2013   CLINICAL DATA:  Abdominal pain  EXAM: CT ABDOMEN AND PELVIS  WITHOUT CONTRAST  TECHNIQUE: Multidetector CT imaging of the abdomen and pelvis was performed following the standard protocol without intravenous contrast.  COMPARISON:  12/24/2011  FINDINGS: BODY WALL: Unremarkable.  LOWER CHEST: Diffuse coronary artery atherosclerosis. Circumferential distal esophageal thickening, similar to prior. Interval reduction of hiatal hernia, with Nissen fundoplication. Mild subpleural lung reticulation.  ABDOMEN/PELVIS:  Liver: Granulomatous changes.  Biliary: No evidence of biliary obstruction or stone.  Pancreas: Unremarkable.  Spleen: Unremarkable.  Adrenals: Unremarkable.  Kidneys and ureters: Asymmetric left perinephric edema, with thickening of the perirenal fascia, new from prior. No stone or hydronephrosis. No cyst on previous imaging.  Bladder: Mild distention. Gas in the urinary bladder, often from urinalysis.  Reproductive: Hysterectomy.  Probable salpingo oophorectomies.  Bowel: No obstruction. Colonic diverticulosis. No pericecal inflammation.  Retroperitoneum: No mass or adenopathy.  Peritoneum: No free fluid or gas.  Vascular: No acute abnormality.  OSSEOUS: No acute abnormalities.  IMPRESSION: Left perinephric edema, usually from pyelonephritis or recently passed ureteral stone.   Electronically Signed   By: Tiburcio Pea [name]   On: 01/03/2013 21:31      Assessment/Plan Active Problems:   Anemia   Chronic kidney disease (CKD), stage III (moderate)   Pyelonephritis   Abdominal pain   AKI (acute kidney injury)   Fever   Hypotension   1. Sepsis due to pyelonephritis- lactic acid elevated, Low BP, HR elevated- place in SDU on rocephin- grew e coli in past- get blood cultures x2 and urine culture- if BP does not improve, may need pressors 2. Hypotension- see above (given 2 L IVF in ER)- may need pressors if not responsive to IVF- holding BP medications 3. Fever- tylenol PRN 4. AKI on CKD III- IVF and monitor 5. Anemia- followed with Dr. Gaylyn Rong 6. abd pain-  due to pyelonephritis    Code Status: full Family Communication: patient/son at bedside Disposition Plan: admit to SDU  Time spent: 75 min  Benjamine Mola Miciah Covelli [facility] Pager [phone]  If 7PM-7AM, please contact night-coverage [email or web address] Password [email or web address] 01/04/2013, 12:06 AM

## 2013-01-04 NOTE — Progress Notes (Signed)
CARE MANAGEMENT NOTE 01/04/2013  Patient:  Nicole Meyer, Nicole Meyer   Account Number:  [id number]  Date Initiated:  01/04/2013  Documentation initiated by:  DAVIS,RHONDA  Subjective/Objective Assessment:   pt admitted due to hypotension and requiring iv fld support, pt very weak and lethgaric     Action/Plan:   from home lives with spouse.   Anticipated DC Date:  01/07/2013   Anticipated DC Plan:  HOME/SELF CARE  In-house referral  NA      DC Planning Services  NA      [facility] Choice  NA   Choice offered to / List presented to:  NA   DME arranged  NA      DME agency  NA     HH arranged  NA      HH agency  NA   Status of service:  In process, will continue to follow Medicare Important Message given?  NA - LOS <3 / Initial given by admissions (If response is "NO", the following Medicare IM given date fields will be blank) Date Medicare IM given:   Date Additional Medicare IM given:    Discharge Disposition:    Per UR Regulation:  Reviewed for med. necessity/level of care/duration of stay  If discussed at Long Length of Stay Meetings, dates discussed:    Comments:  [id number]/Rhonda Stark Jock, BSN, [place] [phone] Chart Reviewed for discharge and hospital needs. Discharge needs at time of review:  None present will follow for needs. Review of patient progress due on [id number].

## 2013-01-04 NOTE — Progress Notes (Signed)
INITIAL NUTRITION ASSESSMENT  DOCUMENTATION CODES Per approved criteria  -Not Applicable   INTERVENTION: Provide Ensure Complete once daily Provide Multivitamin with minerals daily Encourage healthful PO intake Diet advancement per MD discretion  NUTRITION DIAGNOSIS: Inadequate oral intake related to poor appetite as evidenced by 16% wt loss in the past year.   Goal: Pt to meet >/= 90% of their estimated nutrition needs   Monitor:  Diet advancement PO intake Weight Labs  Reason for Assessment: Malnutrition Screening Tool, score of 5  72 y.o. female  Admitting Dx: <principal problem not specified>  ASSESSMENT: 72 y.o. Female with history of COPD, HTN, IBS, renal insufficiency, and GERD who comes in with abdominal pain since last PM. She also had an episode of diarrhea. Per Md note, pt with sepsis due to pyelonephritis.  Pt states that she has been steadily losing weight for the past year; she reports decreased appetite, nausea, and diarrhea contribute to weight loss. Pt reports eating 3 light meals daily PTA. Weight history shows 16% weight loss in the past year- not significant. Encouraged pt to eat 3 healthy meals daily and snacks as needed to keep nutritional status up. Pt reports having a poor appetite today and expresses interest in receiving Ensure supplements.   Nutrition Focused Physical Exam:  Subcutaneous Fat:  Orbital Region: wnl Upper Arm Region: mild wasting Thoracic and Lumbar Region: wnl  Muscle:  Temple Region: wnl Clavicle Bone Region: mild wasting Clavicle and Acromion Bone Region: mild wasting Scapular Bone Region: NA Dorsal Hand: wnl Patellar Region: wnl Anterior Thigh Region: wnl Posterior Calf Region: wnl  Edema: none   Height: Ht Readings from Last 1 Encounters:  01/04/13 5\' 3"  (1.6 m)    Weight: Wt Readings from Last 1 Encounters:  01/04/13 160 lb 7.9 oz (72.8 kg)    Ideal Body Weight: 115 lbs  % Ideal Body Weight: 139%  Wt  Readings from Last 10 Encounters:  01/04/13 160 lb 7.9 oz (72.8 kg)  07/06/12 167 lb 12.8 oz (76.114 kg)  06/29/12 170 lb 3.2 oz (77.202 kg)  05/27/12 174 lb (78.926 kg)  04/29/12 176 lb (79.833 kg)  04/08/12 185 lb 8 oz (84.142 kg)  04/08/12 185 lb 8 oz (84.142 kg)  04/06/12 185 lb 8 oz (84.142 kg)  03/30/12 185 lb 3.2 oz (84.006 kg)  01/09/12 191 lb (86.637 kg)    Usual Body Weight: 200 lbs  % Usual Body Weight: 80%  BMI:  Body mass index is 28.44 kg/(m^2). (Overweight)  Estimated Nutritional Needs: Kcal: 1550-1750 Protein: 80-90 grams Fluid: >/=1.8 L/day  Skin: intact  Diet Order: Full Liquid  EDUCATION NEEDS: -No education needs identified at this time   Intake/Output Summary (Last 24 hours) at 01/04/13 1313 Last data filed at 01/04/13 1200  Gross per 24 hour  Intake 2006.67 ml  Output    350 ml  Net 1656.67 ml    Last BM: 11/16  Labs:   Recent Labs Lab 01/03/13 2010 01/04/13 0200  NA 134* 133*  K 3.8 5.3*  CL 105 105  CO2 19 18*  BUN 37* 38*  CREATININE 1.94* 1.85*  CALCIUM 8.9 9.0  GLUCOSE 109* 124*    CBG (last 3)  No results found for this basename: GLUCAP,  in the last 72 hours  Scheduled Meds: . aspirin EC  81 mg Oral Daily  . cefTRIAXone (ROCEPHIN)  IV  1 g Intravenous Q24H  . DULoxetine  30 mg Oral BID  . famotidine  20 mg  Oral BID  . heparin  5,000 Units Subcutaneous Q8H  . HYDROcodone-acetaminophen  1 tablet Oral BID  . sodium chloride  500 mL Intravenous Once  . sodium chloride  500 mL Intravenous Once  . sodium chloride  3 mL Intravenous Q12H    Continuous Infusions: . sodium chloride 75 mL/hr at 01/04/13 1058    Past Medical History  Diagnosis Date  . Leukocytopenia   . HTN (hypertension)   . Asthma   . IBS (irritable bowel syndrome)   . Anxiety   . Depression   . Chronic kidney disease (CKD), stage III (moderate)   . Hypercalcemia   . Fatigue   . Lymphopenia 08/23/2011  . COPD (chronic obstructive pulmonary  disease)   . Renal insufficiency   . UTI (urinary tract infection) 12/26/2011  . Syncope and collapse 12/24/2011    "loss of consciousness for 3-4 min" (12/26/2011)  . Hypercholesteremia     "above borderline; I don't take RX for it" (12/26/2011)  . H/O hiatal hernia   . GERD (gastroesophageal reflux disease)   . Arthritis     "knees and back" (12/26/2011)  . Osteoporosis   . 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 Iron deficiency anemia     [facility]    Past Surgical History  Procedure Laterality Date  . Hernia repair  1970's    "stomach" (12/26/2011)  . Abdominal hysterectomy  ~ 1980  . Esophagogastroduodenoscopy  12/26/2011    Procedure: ESOPHAGOGASTRODUODENOSCOPY (EGD);  Surgeon: Louis Meckel, MD;  Location: [facility];  Service: Endoscopy;  Laterality: N/A;  . Breast surgery  04-06-12    rt. lumpectomy_benign  . Tubal ligation    . Laparoscopic nissen fundoplication N/A 04/08/2012    Procedure: Laparoscopic Reduction and Repair of Paraesophagel Hiatal Hernia with Nissen;  Surgeon: Ardeth Sportsman, MD;  Location: [facility] ORS;  Service: General;  Laterality: N/A;  Laparoscopic Reduction and Repair of Paraesophagel Hiatal Hernia with Nissen     Ian Malkin RD, LDN Inpatient Clinical Dietitian Pager: [phone] After Hours Pager: [phone]

## 2013-01-04 NOTE — Progress Notes (Signed)
MD called d/t pt screaming out with abdominal pain, increased HR, anxiety, and crying.  New orders received and initiated.  Conley Rolls, RN

## 2013-01-04 NOTE — Progress Notes (Signed)
[facility] PROGRESS NOTE  Nicole Meyer BJY:[id number] DOB: 05/21/1940 DOA: 01/03/2013 PCP: Thora Lance, MD  Assessment/Plan: 1. Sepsis due to pyelonephritis- - admitted to SDU for hypotension overnight.  - continues to have soft BP all day, responding to fluid boluses. Will remain in SDU one more day.  - if her BP does not respond to FLUIDS will need to consult PCCM for vasopressors. Discussed witht he patient and the SON at bedside.  - urine cultures are pending.  - IV rocephin for pyelonephritis.  2. Hypotension- see above (given 2 L IVF in ER)- may need pressors if not responsive to IVF- holding BP medications.  3. Fever- tylenol PRN. Resolved.  4. AKI on CKD III- improved, probably secondary to dehydration.  IVF and monitor 5. Anemia- followed with Dr. Gaylyn Rong as outpatient. Appears to be stable. 6. abd pain-  Resolved. due to pyelonephritis 7. Hyperkalemia: stop the kCL in the lfuids. And repeat K tonight.  8. Hyponatremia: mild , unclear etiology. Will continue to monitor.  9. DVT prophylaxis.   Code Status: full  Family Communication: patient/son at bedside  Disposition Plan: remain in SDU   Consultants:  none  Procedures:  none  Antibiotics:  rocephin  HPI/Subjective: Comfortable.   Objective: Filed Vitals:   01/04/13 0400  BP: 91/34  Pulse: 106  Temp: 99.8 F (37.7 C)  Resp: 29    Intake/Output Summary (Last 24 hours) at 01/04/13 0832 Last data filed at 01/04/13 0400  Gross per 24 hour  Intake 711.67 ml  Output    350 ml  Net 361.67 ml   Filed Weights   01/04/13 0100  Weight: 72.8 kg (160 lb 7.9 oz)    Exam:   General:  Alert afebrile comfortable  Cardiovascular: s1s2  Respiratory: ctab  Abdomen: soft NT ND BS+  Musculoskeletal: NO edema.   Data Reviewed: Basic Metabolic Panel:  Recent Labs Lab 01/03/13 2010 01/04/13 0200  NA 134* 133*  K 3.8 5.3*  CL 105 105  CO2 19 18*  GLUCOSE 109* 124*  BUN 37* 38*   CREATININE 1.94* 1.85*  CALCIUM 8.9 9.0   Liver Function Tests:  Recent Labs Lab 01/03/13 2010  AST 15  ALT 13  ALKPHOS 53  BILITOT 1.4*  PROT 5.9*  ALBUMIN 3.0*    Recent Labs Lab 01/03/13 2010  LIPASE 19   No results found for this basename: AMMONIA,  in the last 168 hours CBC:  Recent Labs Lab 01/03/13 2010 01/04/13 0200  WBC 11.4* 17.3*  NEUTROABS 10.5*  --   HGB 9.7* 10.2*  HCT 28.0* 30.1*  MCV 96.9 96.8  PLT 164 163   Cardiac Enzymes: No results found for this basename: CKTOTAL, CKMB, CKMBINDEX, TROPONINI,  in the last 168 hours BNP (last 3 results) No results found for this basename: PROBNP,  in the last 8760 hours CBG: No results found for this basename: GLUCAP,  in the last 168 hours  Recent Results (from the past 240 hour(s))  MRSA PCR SCREENING     Status: None   Collection Time    01/04/13  1:10 AM      Result Value Range Status   MRSA by PCR NEGATIVE  NEGATIVE Final   Comment:            The GeneXpert MRSA Assay (FDA     approved for NASAL specimens     only), is one component of a     comprehensive MRSA colonization  surveillance program. It is not     intended to diagnose MRSA     infection nor to guide or     monitor treatment for     MRSA infections.     Studies: Ct Abdomen Pelvis Wo Contrast  01/03/2013   CLINICAL DATA:  Abdominal pain  EXAM: CT ABDOMEN AND PELVIS WITHOUT CONTRAST  TECHNIQUE: Multidetector CT imaging of the abdomen and pelvis was performed following the standard protocol without intravenous contrast.  COMPARISON:  12/24/2011  FINDINGS: BODY WALL: Unremarkable.  LOWER CHEST: Diffuse coronary artery atherosclerosis. Circumferential distal esophageal thickening, similar to prior. Interval reduction of hiatal hernia, with Nissen fundoplication. Mild subpleural lung reticulation.  ABDOMEN/PELVIS:  Liver: Granulomatous changes.  Biliary: No evidence of biliary obstruction or stone.  Pancreas: Unremarkable.  Spleen:  Unremarkable.  Adrenals: Unremarkable.  Kidneys and ureters: Asymmetric left perinephric edema, with thickening of the perirenal fascia, new from prior. No stone or hydronephrosis. No cyst on previous imaging.  Bladder: Mild distention. Gas in the urinary bladder, often from urinalysis.  Reproductive: Hysterectomy.  Probable salpingo oophorectomies.  Bowel: No obstruction. Colonic diverticulosis. No pericecal inflammation.  Retroperitoneum: No mass or adenopathy.  Peritoneum: No free fluid or gas.  Vascular: No acute abnormality.  OSSEOUS: No acute abnormalities.  IMPRESSION: Left perinephric edema, usually from pyelonephritis or recently passed ureteral stone.   Electronically Signed   By: Tiburcio Pea [name]   On: 01/03/2013 21:31    Scheduled Meds: . aspirin EC  81 mg Oral Daily  . cefTRIAXone (ROCEPHIN)  IV  1 g Intravenous Q24H  . DULoxetine  30 mg Oral BID  . famotidine  20 mg Oral BID  . heparin  5,000 Units Subcutaneous Q8H  . HYDROcodone-acetaminophen  1 tablet Oral BID  . sodium chloride  500 mL Intravenous Once  . sodium chloride  3 mL Intravenous Q12H   Continuous Infusions: . 0.9 % NaCl with KCl 20 mEq / L 100 mL/hr at 01/04/13 0141    Active Problems:   Anemia   Chronic kidney disease (CKD), stage III (moderate)   Pyelonephritis   Abdominal pain   AKI (acute kidney injury)   Fever   Hypotension    Time spent: 25 min    Halston Kintz  [facility] Pager [phone]. If 7PM-7AM, please contact night-coverage at [email or web address], password [facility] 01/04/2013, 8:32 AM  LOS: 1 day

## 2013-01-04 NOTE — Progress Notes (Signed)
Pt denies pain and very pleasant and resting comfortably.  Conley Rolls, RN

## 2013-01-05 ENCOUNTER — Inpatient Hospital Stay (HOSPITAL_COMMUNITY): Payer: Medicare PPO

## 2013-01-05 ENCOUNTER — Other Ambulatory Visit: Payer: Medicare PPO

## 2013-01-05 LAB — CBC
HCT: 25.7 % — ABNORMAL LOW (ref 36.0–46.0)
Hemoglobin: 8.8 g/dL — ABNORMAL LOW (ref 12.0–15.0)
MCH: 33.5 pg (ref 26.0–34.0)
MCHC: 34.2 g/dL (ref 30.0–36.0)
MCV: 97.7 fL (ref 78.0–100.0)

## 2013-01-05 LAB — BASIC METABOLIC PANEL
BUN: 28 mg/dL — ABNORMAL HIGH (ref 6–23)
CO2: 16 mEq/L — ABNORMAL LOW (ref 19–32)
Chloride: 111 mEq/L (ref 96–112)
GFR calc Af Amer: 48 mL/min — ABNORMAL LOW (ref >90)
Glucose, Bld: 128 mg/dL — ABNORMAL HIGH (ref 70–99)
Potassium: 4.6 mEq/L (ref 3.5–5.1)

## 2013-01-05 MED ORDER — SODIUM CHLORIDE 0.9 % IV BOLUS (SEPSIS)
1000.0000 mL | Freq: Once | INTRAVENOUS | Status: AC
  Administered 2013-01-05: 1000 mL via INTRAVENOUS

## 2013-01-05 MED ORDER — SODIUM BICARBONATE 650 MG PO TABS
650.0000 mg | ORAL_TABLET | Freq: Two times a day (BID) | ORAL | Status: DC
  Administered 2013-01-05 – 2013-01-08 (×7): 650 mg via ORAL
  Filled 2013-01-05 (×9): qty 1

## 2013-01-05 MED ORDER — CEPASTAT 14.5 MG MT LOZG
1.0000 | LOZENGE | OROMUCOSAL | Status: DC | PRN
  Filled 2013-01-05: qty 18

## 2013-01-05 MED ORDER — SODIUM CHLORIDE 0.9 % IV BOLUS (SEPSIS)
250.0000 mL | Freq: Once | INTRAVENOUS | Status: AC
  Administered 2013-01-05: 250 mL via INTRAVENOUS

## 2013-01-05 MED ORDER — SODIUM CHLORIDE 0.9 % IV BOLUS (SEPSIS)
500.0000 mL | Freq: Once | INTRAVENOUS | Status: AC
  Administered 2013-01-05: 500 mL via INTRAVENOUS

## 2013-01-05 NOTE — Progress Notes (Signed)
Pt. Continued to be hypotensive despite a total of 750 mL in boluses. MD notified. Orders given for 1L bolus. Heart rate, UOP and mentation are WNL. Continuing to monitor.

## 2013-01-05 NOTE — Progress Notes (Signed)
Liter bolus complete. BP 107/55. Continuing to monitor

## 2013-01-05 NOTE — Progress Notes (Signed)
[facility] PROGRESS NOTE  Nicole Meyer:[id number] DOB: April 06, 1940 DOA: 01/03/2013 PCP: Thora Lance, MD Brief HPI: Nicole Meyer is a 72 y.o. female came in with abdominal pain since last PM. Pain in her back as well as epigastric- describes as an 8/10. She also had an episode of diarrhea. +nausea. + fever + dysuria In the Er, a CT scan was done that showed, Left perinephric edema, usually from pyelonephritis or recently passed ureteral stone. Urinalysis showed a UTI and patient was given rocephin. BP was found to be low and 2L of IVF was given with minimal improvement. Her WBC was slightly elevated and had AKI. Patient was admitted to SDU  Assessment/Plan: 1. Sepsis due to pyelonephritis- - admitted to SDU for hypotension .   PT had soft BP all day on 11/17, responding to fluid boluses.  - if her BP does not respond to FLUIDS will need to consult PCCM for vasopressors. Discussed witht he patient and the SON at bedside.  - urine cultures show ECOLI , sensitivities pending and blood culture show GNR. she is afebrile and leukocytosis has resolved.  - IV rocephin for pyelonephritis.  2. Hypotension- see above (given 2 L IVF in ER)- may need pressors if not responsive to IVF- holding BP medications.  3. Fever- tylenol PRN. Resolved.  4. AKI on CKD III- improved, probably secondary to dehydration.  IVF and monitor 5. Anemia- followed with Dr. Gaylyn Rong as outpatient. Appears to be stable. 6. abd pain-  Resolved. due to pyelonephritis 7. Hyperkalemia: stop the kCL in the lfuids. And repeat K tonight is normal.  8. Hyponatremia: mild , unclear etiology. Will continue to monitor.  9. DVT prophylaxis.   Code Status: full  Family Communication: patient/son at bedside  Disposition Plan: transfer to telemetry.    Consultants:  none  Procedures:  none  Antibiotics:  Rocephin 11/17  HPI/Subjective: Comfortable.   Objective: Filed Vitals:   01/05/13 1000  BP: 108/44   Pulse: 72  Temp:   Resp: 25    Intake/Output Summary (Last 24 hours) at 01/05/13 1056 Last data filed at 01/05/13 1000  Gross per 24 hour  Intake 6087.5 ml  Output   1865 ml  Net 4222.5 ml   Filed Weights   01/04/13 0100  Weight: 72.8 kg (160 lb 7.9 oz)    Exam:   General:  Alert afebrile comfortable  Cardiovascular: s1s2  Respiratory: ctab  Abdomen: soft NT ND BS+  Musculoskeletal: NO edema.   Data Reviewed: Basic Metabolic Panel:  Recent Labs Lab 01/03/13 2010 01/04/13 0200 01/04/13 1807 01/05/13 0330  NA 134* 133* 134* 134*  K 3.8 5.3* 4.5 4.6  CL 105 105 109 111  CO2 19 18* 17* 16*  GLUCOSE 109* 124* 106* 128*  BUN 37* 38* 33* 28*  CREATININE 1.94* 1.85* 1.49* 1.26*  CALCIUM 8.9 9.0 8.4 8.0*   Liver Function Tests:  Recent Labs Lab 01/03/13 2010  AST 15  ALT 13  ALKPHOS 53  BILITOT 1.4*  PROT 5.9*  ALBUMIN 3.0*    Recent Labs Lab 01/03/13 2010  LIPASE 19   No results found for this basename: AMMONIA,  in the last 168 hours CBC:  Recent Labs Lab 01/03/13 2010 01/04/13 0200 01/05/13 0330  WBC 11.4* 17.3* 9.4  NEUTROABS 10.5*  --   --   HGB 9.7* 10.2* 8.8*  HCT 28.0* 30.1* 25.7*  MCV 96.9 96.8 97.7  PLT 164 163 94*   Cardiac Enzymes: No results found  for this basename: CKTOTAL, CKMB, CKMBINDEX, TROPONINI,  in the last 168 hours BNP (last 3 results) No results found for this basename: PROBNP,  in the last 8760 hours CBG: No results found for this basename: GLUCAP,  in the last 168 hours  Recent Results (from the past 240 hour(s))  URINE CULTURE     Status: None   Collection Time    01/03/13 10:04 PM      Result Value Range Status   Specimen Description URINE, CLEAN CATCH   Final   Special Requests NONE   Final   Culture  Setup Time     Final   Value: 01/04/2013 06:32     Performed at [facility]   Culture     Final   Value: ESCHERICHIA COLI     Performed at [facility]   Report Status PENDING    Incomplete  MRSA PCR SCREENING     Status: None   Collection Time    01/04/13  1:10 AM      Result Value Range Status   MRSA by PCR NEGATIVE  NEGATIVE Final   Comment:            The GeneXpert MRSA Assay (FDA     approved for NASAL specimens     only), is one component of a     comprehensive MRSA colonization     surveillance program. It is not     intended to diagnose MRSA     infection nor to guide or     monitor treatment for     MRSA infections.  CULTURE, BLOOD (ROUTINE X 2)     Status: None   Collection Time    01/04/13  2:00 AM      Result Value Range Status   Specimen Description BLOOD LEFT ARM   Final   Special Requests BOTTLES DRAWN AEROBIC AND ANAEROBIC 3CC   Final   Culture  Setup Time     Final   Value: 01/04/2013 08:14     Performed at [facility]   Culture     Final   Value: GRAM NEGATIVE RODS     Note: Gram Stain Report Called to,Read Back By and Verified With: DENISE ALDRIDGE ON 01/04/2013 AT 8:53P BY WILEJ     Performed at [facility]   Report Status PENDING   Incomplete     Studies: Ct Abdomen Pelvis Wo Contrast  01/03/2013   CLINICAL DATA:  Abdominal pain  EXAM: CT ABDOMEN AND PELVIS WITHOUT CONTRAST  TECHNIQUE: Multidetector CT imaging of the abdomen and pelvis was performed following the standard protocol without intravenous contrast.  COMPARISON:  12/24/2011  FINDINGS: BODY WALL: Unremarkable.  LOWER CHEST: Diffuse coronary artery atherosclerosis. Circumferential distal esophageal thickening, similar to prior. Interval reduction of hiatal hernia, with Nissen fundoplication. Mild subpleural lung reticulation.  ABDOMEN/PELVIS:  Liver: Granulomatous changes.  Biliary: No evidence of biliary obstruction or stone.  Pancreas: Unremarkable.  Spleen: Unremarkable.  Adrenals: Unremarkable.  Kidneys and ureters: Asymmetric left perinephric edema, with thickening of the perirenal fascia, new from prior. No stone or hydronephrosis. No cyst on previous  imaging.  Bladder: Mild distention. Gas in the urinary bladder, often from urinalysis.  Reproductive: Hysterectomy.  Probable salpingo oophorectomies.  Bowel: No obstruction. Colonic diverticulosis. No pericecal inflammation.  Retroperitoneum: No mass or adenopathy.  Peritoneum: No free fluid or gas.  Vascular: No acute abnormality.  OSSEOUS: No acute abnormalities.  IMPRESSION: Left perinephric edema, usually from  pyelonephritis or recently passed ureteral stone.   Electronically Signed   By: Tiburcio Pea [name]   On: 01/03/2013 21:31   Dg Chest Port 1 View  01/05/2013   CLINICAL DATA:  Recent surgery for hiatal hernia repair. Nausea and vomiting.  EXAM: PORTABLE CHEST - 1 VIEW  COMPARISON:  Chest radiograph 04/08/2012.  FINDINGS: Stable cardiac and mediastinal contours. Minimal heterogeneous opacities left lung base. Blunting of the left costophrenic angle. No definite pneumothorax. Re- demonstrated old left lateral rib fracture.  IMPRESSION: Minimal heterogeneous opacities left lung base may represent atelectasis or scarring. Blunting of the left costophrenic angle may represent small amount of pleural fluid versus thickening.   Electronically Signed   By: Annia Belt [name]   On: 01/05/2013 09:05    Scheduled Meds: . aspirin EC  81 mg Oral Daily  . cefTRIAXone (ROCEPHIN)  IV  1 g Intravenous Q24H  . DULoxetine  30 mg Oral BID  . famotidine  20 mg Oral BID  . feeding supplement (ENSURE)  1 Container Oral Q24H  . HYDROcodone-acetaminophen  1 tablet Oral BID  . multivitamin with minerals  1 tablet Oral Daily  . sodium chloride  3 mL Intravenous Q12H   Continuous Infusions: . sodium chloride 100 mL/hr at 01/05/13 1610    Active Problems:   Anemia   Chronic kidney disease (CKD), stage III (moderate)   Pyelonephritis   Abdominal pain   AKI (acute kidney injury)   Fever   Hypotension    Time spent: 30 min    Samyukta Cura  [facility] Pager [phone]. If 7PM-7AM, please contact  night-coverage at [email or web address], password [facility] 01/05/2013, 10:56 AM  LOS: 2 days

## 2013-01-05 NOTE — Progress Notes (Signed)
Pt. BP 80's over 40's sustained after re-check. Paged MD. Given orders for bolus and manual re-check of blood pressure post bolus. Continuing to monitor.

## 2013-01-06 ENCOUNTER — Inpatient Hospital Stay (HOSPITAL_COMMUNITY): Payer: Medicare PPO

## 2013-01-06 DIAGNOSIS — D696 Thrombocytopenia, unspecified: Secondary | ICD-10-CM | POA: Diagnosis not present

## 2013-01-06 LAB — HEPATIC FUNCTION PANEL
Albumin: 2.2 g/dL — ABNORMAL LOW (ref 3.5–5.2)
Indirect Bilirubin: 0.5 mg/dL (ref 0.3–0.9)
Total Bilirubin: 0.7 mg/dL (ref 0.3–1.2)
Total Protein: 5.7 g/dL — ABNORMAL LOW (ref 6.0–8.3)

## 2013-01-06 LAB — CULTURE, BLOOD (ROUTINE X 2)

## 2013-01-06 LAB — URINE CULTURE: Culture: >=100000

## 2013-01-06 MED ORDER — SODIUM CHLORIDE 0.9 % IV SOLN
1.0000 g | INTRAVENOUS | Status: DC
  Administered 2013-01-06 – 2013-01-08 (×3): 1 g via INTRAVENOUS
  Filled 2013-01-06 (×3): qty 1

## 2013-01-06 MED ORDER — PANTOPRAZOLE SODIUM 40 MG PO TBEC
40.0000 mg | DELAYED_RELEASE_TABLET | Freq: Every day | ORAL | Status: DC
  Administered 2013-01-06 – 2013-01-08 (×3): 40 mg via ORAL
  Filled 2013-01-06 (×3): qty 1

## 2013-01-06 NOTE — Progress Notes (Signed)
OT Cancellation Note  Patient Details Name: VIRGA HALTIWANGER MRN: [id number] DOB: February 09, 1941   Cancelled Treatment:    Reason Eval/Treat Not Completed: Patient at procedure or test/ unavailable Per nursing, going for test in a few minutes. Will try back later time.  Lennox Laity [phone] 01/06/2013, 10:35 AM

## 2013-01-06 NOTE — Progress Notes (Signed)
[facility] PROGRESS NOTE  [name] WGN:[id number] DOB: 09-09-1940 DOA: 01/03/2013  PCP: Nicole Lance, MD  Brief HPI: Nicole Meyer is a 72 y.o. female who presented with abdominal pain. Pain was in her back as well as epigastric area. Ct abdomen suggested Pyelonephritis. She was hypotensive as well. She was initially monitored in stepdown and then was subsequently transferred to the floor.  Past medical history:  Past Medical History  Diagnosis Date  . Leukocytopenia   . HTN (hypertension)   . Asthma   . IBS (irritable bowel syndrome)   . Anxiety   . Depression   . Chronic kidney disease (CKD), stage III (moderate)   . Hypercalcemia   . Fatigue   . Lymphopenia 08/23/2011  . COPD (chronic obstructive pulmonary disease)   . Renal insufficiency   . UTI (urinary tract infection) 12/26/2011  . Syncope and collapse 12/24/2011    "loss of consciousness for 3-4 min" (12/26/2011)  . Hypercholesteremia     "above borderline; I don't take RX for it" (12/26/2011)  . H/O hiatal hernia   . GERD (gastroesophageal reflux disease)   . Arthritis     "knees and back" (12/26/2011)  . Osteoporosis   . 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 Iron deficiency anemia     [facility]    Consultants: None  Procedures: None  Antibiotics: Ceftriaxone 11/16-11/18 Ertapenem 11/18-->  Subjective: Patient still with abdominal pain, back pain, weakness. Denies any vomiting. No further diarrhea. Denies blood in urine.   Objective: Vital Signs  Filed Vitals:   01/05/13 1200 01/05/13 1300 01/05/13 2100 01/06/13 0500  BP: 108/50 138/70 129/80 123/75  Pulse: 73 71 77 70  Temp:  97.6 F (36.4 C) 98.2 F (36.8 C) 98.6 F (37 C)  TempSrc:  Oral Oral Oral  Resp: 19 18 16 16   Height:      Weight:      SpO2: 96% 98% 99% 100%    Filed Weights   01/04/13 0100  Weight: 72.8 kg (160 lb 7.9 oz)    Intake/Output from previous  day: 11/18 0701 - 11/19 0700 In: 2340 [P.O.:440; I.V.:1900] Out: 1400 [Urine:1400]  General appearance: alert, cooperative, no distress and moderately obese Head: Normocephalic, without obvious abnormality, atraumatic Resp: clear to auscultation bilaterally Cardio: regular rate and rhythm, S1, S2 normal, no murmur, click, rub or gallop GI: Soft, tender in flanks, Tender in RUQ without rebound, rigidity or guarding. No masses or organomegaly. BS present.  Extremities: extremities normal, atraumatic, no cyanosis or edema Pulses: 2+ and symmetric Neurologic: Alert and oriented x 3. No focal deficits.  Lab Results:  Basic Metabolic Panel:  Recent Labs Lab 01/03/13 2010 01/04/13 0200 01/04/13 1807 01/05/13 0330  NA 134* 133* 134* 134*  K 3.8 5.3* 4.5 4.6  CL 105 105 109 111  CO2 19 18* 17* 16*  GLUCOSE 109* 124* 106* 128*  BUN 37* 38* 33* 28*  CREATININE 1.94* 1.85* 1.49* 1.26*  CALCIUM 8.9 9.0 8.4 8.0*   Liver Function Tests:  Recent Labs Lab 01/03/13 2010  AST 15  ALT 13  ALKPHOS 53  BILITOT 1.4*  PROT 5.9*  ALBUMIN 3.0*    Recent Labs Lab 01/03/13 2010  LIPASE 19   CBC:  Recent Labs Lab 01/03/13 2010 01/04/13 0200 01/05/13 0330  WBC 11.4* 17.3* 9.4  NEUTROABS 10.5*  --   --   HGB 9.7* 10.2* 8.8*  HCT 28.0*  30.1* 25.7*  MCV 96.9 96.8 97.7  PLT 164 163 94*    Recent Results (from the past 240 hour(s))  URINE CULTURE     Status: None   Collection Time    01/03/13 10:04 PM      Result Value Range Status   Specimen Description URINE, CLEAN CATCH   Final   Special Requests NONE   Final   Culture  Setup Time     Final   Value: 01/04/2013 06:32     Performed at [facility]   Culture     Final   Value: >=100,000 COLONIES/mL ESCHERICHIA COLI     Note: Confirmed Extended Spectrum Beta-Lactamase Producer (ESBL) CRITICAL RESULT CALLED TO, READ BACK BY AND VERIFIED WITH: ALFONSO FAISON AT 5:50 A.M. ON 01/06/2013 WARRB     Performed at [facility]   Report Status 01/06/2013 FINAL   Final   Organism ID, Bacteria ESCHERICHIA COLI   Final  MRSA PCR SCREENING     Status: None   Collection Time    01/04/13  1:10 AM      Result Value Range Status   MRSA by PCR NEGATIVE  NEGATIVE Final   Comment:            The GeneXpert MRSA Assay (FDA     approved for NASAL specimens     only), is one component of a     comprehensive MRSA colonization     surveillance program. It is not     intended to diagnose MRSA     infection nor to guide or     monitor treatment for     MRSA infections.  CULTURE, BLOOD (ROUTINE X 2)     Status: None   Collection Time    01/04/13  2:00 AM      Result Value Range Status   Specimen Description BLOOD LEFT ARM   Final   Special Requests BOTTLES DRAWN AEROBIC AND ANAEROBIC 3CC   Final   Culture  Setup Time     Final   Value: 01/04/2013 08:14     Performed at [facility]   Culture     Final   Value: ESCHERICHIA COLI     Note: Confirmed Extended Spectrum Beta-Lactamase Producer (ESBL) CRITICAL RESULT CALLED TO, READ BACK BY AND VERIFIED WITH: CHLOE ADDISON 01/06/13 0825 BY [email or web address]     Note: Gram Stain Report Called to,Read Back By and Verified With: DENISE ALDRIDGE ON 01/04/2013 AT 8:53P BY Serafina Mitchell     Performed at [facility]   Report Status 01/06/2013 FINAL   Final   Organism ID, Bacteria ESCHERICHIA COLI   Final      Studies/Results: Dg Chest Port 1 View  01/05/2013   CLINICAL DATA:  Recent surgery for hiatal hernia repair. Nausea and vomiting.  EXAM: PORTABLE CHEST - 1 VIEW  COMPARISON:  Chest radiograph 04/08/2012.  FINDINGS: Stable cardiac and mediastinal contours. Minimal heterogeneous opacities left lung base. Blunting of the left costophrenic angle. No definite pneumothorax. Re- demonstrated old left lateral rib fracture.  IMPRESSION: Minimal heterogeneous opacities left lung base may represent atelectasis or scarring. Blunting of the left costophrenic angle may represent  small amount of pleural fluid versus thickening.   Electronically Signed   By: Annia Belt [name]   On: 01/05/2013 09:05    Medications:  Scheduled: . DULoxetine  30 mg Oral BID  . ertapenem  1 g Intravenous Q24H  . feeding supplement (ENSURE)  1 Container Oral Q24H  . HYDROcodone-acetaminophen  1 tablet Oral BID  . multivitamin with minerals  1 tablet Oral Daily  . pantoprazole  40 mg Oral Q1200  . sodium bicarbonate  650 mg Oral BID  . sodium chloride  3 mL Intravenous Q12H   Continuous: . sodium chloride 100 mL/hr at 01/06/13 0600   ZOX:[id number], acetaminophen, ALPRAZolam, morphine injection, ondansetron (ZOFRAN) IV, ondansetron, phenol-menthol  Assessment/Plan:  Active Problems:   Anemia   Chronic kidney disease (CKD), stage III (moderate)   Pyelonephritis   Abdominal pain   AKI (acute kidney injury)   Fever   Hypotension   Pyelonephritis with Sepsis Initially admitted to SDU for hypotension. BP has improved. Remains afebrile. Urine and blood growing E coli ESBL. Changed to Ertapenem last night. Continue for now. Will need extended treatment at home. Will discuss with ID. She also has RUQ abdominal tenderness. Will check RUQ [place] and LFT.  Hypotension See above. BP improved. Holding BP medications.   AKI on CKD III with metabolic acidosis Improved with IVF. Continue for now. Monitor bicarb.  Thrombocytopenia Etiology unclear. Stop Pepcid as it can cause thrombocytopenia. Not on heparin products. Monitor counts.  Anemia Hgb slightly low today. No obvious bleeding. Possibly from dilution. Monitor.   Hyperkalemia Resolved.  Mild Hyponatremia Possibly from sepsis/dehydration. Stable.   Code Status: Full code  DVT Prophylaxis: SCD's    Family Communication: Discussed with patient.  Disposition Plan: Will return home when better. PT/OT evaluation    LOS: 3 days   [facility]  [facility] Pager [phone] 01/06/2013, 9:47 AM  If 8PM-8AM, please  contact night-coverage at [email or web address], password [facility]

## 2013-01-06 NOTE — Progress Notes (Signed)
[facility] called in results of blood culture done on 01/04/13. Results reported to MD at [email or web address] at [phone]. Will continue to monitor pt.

## 2013-01-06 NOTE — Progress Notes (Signed)
[facility] called in result of Urine culture.  Results reported to oncall practitioner at [email or web address] at [phone].

## 2013-01-07 DIAGNOSIS — R7881 Bacteremia: Secondary | ICD-10-CM | POA: Diagnosis present

## 2013-01-07 DIAGNOSIS — A499 Bacterial infection, unspecified: Secondary | ICD-10-CM | POA: Diagnosis present

## 2013-01-07 LAB — COMPREHENSIVE METABOLIC PANEL
ALT: 15 U/L (ref 0–35)
AST: 14 U/L (ref 0–37)
BUN: 14 mg/dL (ref 6–23)
CO2: 19 mEq/L (ref 19–32)
Chloride: 106 mEq/L (ref 96–112)
Creatinine, Ser: 1.09 mg/dL (ref 0.50–1.10)
GFR calc non Af Amer: 49 mL/min — ABNORMAL LOW (ref >90)
Sodium: 133 mEq/L — ABNORMAL LOW (ref 135–145)
Total Bilirubin: 1.2 mg/dL (ref 0.3–1.2)

## 2013-01-07 LAB — CBC
Hemoglobin: 7.2 g/dL — ABNORMAL LOW (ref 12.0–15.0)
MCV: 95.5 fL (ref 78.0–100.0)
Platelets: 90 10*3/uL — ABNORMAL LOW (ref 150–400)
RBC: 2.2 MIL/uL — ABNORMAL LOW (ref 3.87–5.11)
WBC: 3.1 10*3/uL — ABNORMAL LOW (ref 4.0–10.5)

## 2013-01-07 LAB — PREPARE RBC (CROSSMATCH)

## 2013-01-07 MED ORDER — FUROSEMIDE 10 MG/ML IJ SOLN
20.0000 mg | Freq: Once | INTRAMUSCULAR | Status: AC
  Administered 2013-01-08: 03:00:00 20 mg via INTRAVENOUS
  Filled 2013-01-07: qty 2

## 2013-01-07 NOTE — Evaluation (Signed)
Physical Therapy Evaluation Patient Details Name: Nicole Meyer MRN: [id number] DOB: May 24, 1940 Today's Date: 01/07/2013 Time: 7829-5621 PT Time Calculation (min): 13 min  PT Assessment / Plan / Recommendation History of Present Illness  72 y.o. female who presented with abdominal pain. Pain was in her back as well as epigastric area. Ct abdomen suggested Pyelonephritis. She was hypotensive as well. She was initially monitored in stepdown and then was subsequently transferred to the floor.  Clinical Impression  Pt admitted with pyelonephritis. Pt currently with functional limitations due to the deficits listed below (see PT Problem List).  Pt will benefit from skilled PT to increase their independence and safety with mobility to allow discharge to the venue listed below.  Pt reports not ambulating since Sunday so presents with generalized weakness and fatigues quickly however able to ambulate 30 feet with RW.        PT Assessment  Patient needs continued PT services    Follow Up Recommendations  Home health PT;Supervision for mobility/OOB    Does the patient have the potential to tolerate intense rehabilitation      Barriers to Discharge        Equipment Recommendations  Rolling walker with 5" wheels    Recommendations for Other Services     Frequency Min 3X/week    Precautions / Restrictions Precautions Precautions: Fall   Pertinent Vitals/Pain Pt with edematous L UE, elevated with pillow upon return to recliner      Mobility  Bed Mobility Bed Mobility: Not assessed Details for Bed Mobility Assistance: pt up in recliner upon arrival Transfers Transfers: Sit to Stand;Stand to Sit Sit to Stand: 4: Min guard;From chair/3-in-1;With armrests Stand to Sit: 4: Min guard;To chair/3-in-1;With armrests Details for Transfer Assistance: verbal cues for safe technique Ambulation/Gait Ambulation/Gait Assistance: 4: Min guard Ambulation Distance (Feet): 30 Feet Assistive  device: Rolling walker Ambulation/Gait Assistance Details: pt reports weakness and fatigue, has not ambulated since Sun, recliner brought behind pt Gait Pattern: Step-through pattern;Decreased stride length;Decreased hip/knee flexion - right;Decreased hip/knee flexion - left Gait velocity: decreased General Gait Details: stiff slow gait with very short steps    Exercises     PT Diagnosis: Difficulty walking;Generalized weakness  PT Problem List: Decreased strength;Decreased activity tolerance;Decreased mobility;Decreased knowledge of use of DME PT Treatment Interventions: DME instruction;Gait training;Functional mobility training;Stair training;Therapeutic activities;Therapeutic exercise;Patient/family education     PT Goals(Current goals can be found in the care plan section) Acute Rehab PT Goals Patient Stated Goal: agreeable to PT PT Goal Formulation: With patient Time For Goal Achievement: 01/21/13 Potential to Achieve Goals: Good  Visit Information  Last PT Received On: 01/07/13 Assistance Needed: +1 History of Present Illness: 72 y.o. female who presented with abdominal pain. Pain was in her back as well as epigastric area. Ct abdomen suggested Pyelonephritis. She was hypotensive as well. She was initially monitored in stepdown and then was subsequently transferred to the floor.       Prior Functioning  Home Living Family/patient expects to be discharged to:: Private residence Living Arrangements: Spouse/significant other Type of Home: House Home Access: Stairs to enter [employer] of Steps: 3 Entrance Stairs-Rails: Can reach both Home Layout: One level Home Equipment: Walker - standard;Cane - single point;Crutches;Bedside commode Prior Function Level of Independence: Independent Communication Communication: No difficulties    Cognition  Cognition Arousal/Alertness: Awake/alert Behavior During Therapy: WFL for tasks assessed/performed Overall Cognitive  Status: Within Functional Limits for tasks assessed    Extremity/Trunk Assessment Lower Extremity Assessment Lower Extremity  Assessment: Generalized weakness   Balance    End of Session PT - End of Session Activity Tolerance: Patient limited by fatigue Patient left: in chair;with call bell/phone within reach;with chair alarm set;with family/visitor present Nurse Communication: Mobility status (observed pt ambulating)  GP     Shameeka Silliman,KATHrine E 01/07/2013, 1:09 PM Zenovia Jarred, PT, DPT 01/07/2013 Pager: [phone]

## 2013-01-07 NOTE — Progress Notes (Signed)
Spoke with pt concerning Home Health. Pt selected [facility] for [facility] needs. Referral given to [facility] in house rep.

## 2013-01-07 NOTE — Evaluation (Signed)
Occupational Therapy Evaluation Patient Details Name: Nicole Meyer MRN: [id number] DOB: 11/23/1940 Today's Date: 01/07/2013 Time: 4098-1191 OT Time Calculation (min): 18 min  OT Assessment / Plan / Recommendation History of present illness 72 y.o. female who presented with abdominal pain. Pain was in her back as well as epigastric area. Ct abdomen suggested Pyelonephritis. She was hypotensive as well. She was initially monitored in stepdown and then was subsequently transferred to the floor.   Clinical Impression   Pt was admitted for the above.  At time of eval, pt was limited by fatique and edema in bil UEs.  HgB 7.2.  Pt will benefit from skilled OT to increase strength and endurance for adls.  Goals in acute are for set up/supervision.      OT Assessment  Patient needs continued OT Services    Follow Up Recommendations  Home health OT (depending upon progress)    Barriers to Discharge      Equipment Recommendations  None recommended by OT (has 3:1 from husband's surgery)    Recommendations for Other Services    Frequency  Min 2X/week    Precautions / Restrictions Precautions Precautions: Fall Restrictions Weight Bearing Restrictions: No   Pertinent Vitals/Pain No c/o pain    ADL  Grooming: Set up Where Assessed - Grooming: Unsupported sitting Upper Body Bathing: Minimal assistance Where Assessed - Upper Body Bathing: Unsupported sitting Lower Body Bathing: Minimal assistance Where Assessed - Lower Body Bathing: Supported sit to stand Upper Body Dressing: Minimal assistance Where Assessed - Upper Body Dressing: Unsupported sitting Lower Body Dressing: Moderate assistance Where Assessed - Lower Body Dressing: Supported sit to Pharmacist, hospital: Minimal assistance;Min guard Toilet Transfer Method: Stand pivot (chair to bed) Toileting - Clothing Manipulation and Hygiene: Minimal assistance Where Assessed - Toileting Clothing Manipulation and Hygiene: Sit to  stand from 3-in-1 or toilet Transfers/Ambulation Related to ADLs: sit to stand performed twice:  after eval, pt decided to go back to bed ADL Comments: pt fatiques easily.  HgB 7.2.  pt has swelling in bil hands, L worse than right.  Needs assistance with ringing cloths for adls.      OT Diagnosis: Generalized weakness  OT Problem List: Decreased strength;Decreased activity tolerance;Impaired balance (sitting and/or standing);Increased edema OT Treatment Interventions: Self-care/ADL training;Energy conservation;DME and/or AE instruction;Patient/family education;Balance training   OT Goals(Current goals can be found in the care plan section) Acute Rehab OT Goals Patient Stated Goal: get strength back OT Goal Formulation: With patient Time For Goal Achievement: 01/21/13 Potential to Achieve Goals: Good ADL Goals Pt Will Transfer to Toilet: with supervision;ambulating;bedside commode Pt Will Perform Toileting - Clothing Manipulation and hygiene: with supervision;sit to/from stand Pt Will Perform Tub/Shower Transfer: with supervision;ambulating;3 in 1;Shower transfer Additional ADL Goal #1: Pt will perform ADL with set up and supervision, sit to stand  Visit Information  Last OT Received On: 01/07/13 Assistance Needed: +1 History of Present Illness: 72 y.o. female who presented with abdominal pain. Pain was in her back as well as epigastric area. Ct abdomen suggested Pyelonephritis. She was hypotensive as well. She was initially monitored in stepdown and then was subsequently transferred to the floor.       Prior Functioning     Home Living Family/patient expects to be discharged to:: Private residence Living Arrangements: Spouse/significant other Type of Home: House Home Access: Stairs to enter [employer] of Steps: 3 Entrance Stairs-Rails: Can reach both Home Layout: One level Home Equipment: Walker - standard;Cane - single point;Crutches;Bedside commode  Prior  Function Level of Independence: Independent Communication Communication: No difficulties Dominant Hand: Right         Vision/Perception     Cognition  Cognition Arousal/Alertness: Awake/alert Behavior During Therapy: WFL for tasks assessed/performed Overall Cognitive Status: Within Functional Limits for tasks assessed    Extremity/Trunk Assessment Upper Extremity Assessment Upper Extremity Assessment: Generalized weakness (bil UEs with edema, L more than R) Lower Extremity Assessment Lower Extremity Assessment: Generalized weakness     Mobility Bed Mobility Bed Mobility: Sit to Supine Sit to Supine: 5: Supervision;With rail Details for Bed Mobility Assistance: pt up in recliner upon arrival Transfers Sit to Stand: 4: Min guard;4: Min assist;From chair/3-in-1;With upper extremity assist Stand to Sit: 4: Min guard;To chair/3-in-1;To bed Details for Transfer Assistance: pt needed minA for second stand:  fatiqued     Exercise     Balance Balance Balance Assessed: Yes Static Standing Balance Static Standing - Balance Support: No upper extremity supported Static Standing - Level of Assistance: 5: Stand by assistance Static Standing - Comment/# of Minutes: 1 minute   End of Session OT - End of Session Activity Tolerance: Patient tolerated treatment well Patient left: in bed;with call bell/phone within reach;with bed alarm set;with family/visitor present  GO     Nicole Meyer 01/07/2013, 2:11 PM Marica Otter, OTR/L [phone] 01/07/2013

## 2013-01-07 NOTE — ED Provider Notes (Signed)
Medical screening examination/treatment/procedure(s) were performed by non-physician practitioner and as supervising physician I was immediately available for consultation/collaboration.    Nelia Shi, MD 01/07/13 [phone]

## 2013-01-07 NOTE — Progress Notes (Signed)
[facility] PROGRESS NOTE  Nicole Meyer QIH:[id number] DOB: May 14, 1940 DOA: 01/03/2013  PCP: Thora Lance, MD  Brief HPI: Nicole Meyer is a 72 y.o. female who presented with abdominal pain. Pain was in her back as well as epigastric area. Ct abdomen suggested Pyelonephritis. She was hypotensive as well. She was initially monitored in stepdown and then was subsequently transferred to the floor.  Past medical history:  Past Medical History  Diagnosis Date  . Leukocytopenia   . HTN (hypertension)   . Asthma   . IBS (irritable bowel syndrome)   . Anxiety   . Depression   . Chronic kidney disease (CKD), stage III (moderate)   . Hypercalcemia   . Fatigue   . Lymphopenia 08/23/2011  . COPD (chronic obstructive pulmonary disease)   . Renal insufficiency   . UTI (urinary tract infection) 12/26/2011  . Syncope and collapse 12/24/2011    "loss of consciousness for 3-4 min" (12/26/2011)  . Hypercholesteremia     "above borderline; I don't take RX for it" (12/26/2011)  . H/O hiatal hernia   . GERD (gastroesophageal reflux disease)   . Arthritis     "knees and back" (12/26/2011)  . Osteoporosis   . 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 Iron deficiency anemia     [facility]    Consultants: None  Procedures: None  Antibiotics: Ceftriaxone 11/16-11/18 Ertapenem 11/18-->  Subjective: Patient feels better. Pain in abdomen is better. Still feels weak. Denies any bleeding. No nausea but has poor appetite.   Objective: Vital Signs  Filed Vitals:   01/06/13 1331 01/06/13 2158 01/06/13 2300 01/07/13 0435  BP: 116/59 129/65  129/68  Pulse: 73 88  81  Temp: 98.8 F (37.1 C) 100.7 F (38.2 C) 98.6 F (37 C) 98.8 F (37.1 C)  TempSrc: Oral Oral Oral Oral  Resp: 18 16  16   Height:      Weight:      SpO2: 98% 97%  99%    Filed Weights   01/04/13 0100  Weight: 72.8 kg (160 lb 7.9 oz)    Intake/Output from  previous day: 11/19 0701 - 11/20 0700 In: 2640 [P.O.:240; I.V.:2300; IV Piggyback:100] Out: 1100 [Urine:1100]  General appearance: alert, cooperative, no distress and moderately obese Resp: clear to auscultation bilaterally Cardio: regular rate and rhythm, S1, S2 normal, no murmur, click, rub or gallop GI: Soft, less tender today. No rebound, rigidity or guarding. No masses or organomegaly. BS present.  Extremities: extremities normal, atraumatic, no cyanosis or edema Pulses: 2+ and symmetric Neurologic: Alert and oriented x 3. No focal deficits.  Lab Results:  Basic Metabolic Panel:  Recent Labs Lab 01/03/13 2010 01/04/13 0200 01/04/13 1807 01/05/13 0330 01/07/13 0513  NA 134* 133* 134* 134* 133*  K 3.8 5.3* 4.5 4.6 4.2  CL 105 105 109 111 106  CO2 19 18* 17* 16* 19  GLUCOSE 109* 124* 106* 128* 98  BUN 37* 38* 33* 28* 14  CREATININE 1.94* 1.85* 1.49* 1.26* 1.09  CALCIUM 8.9 9.0 8.4 8.0* 8.6   Liver Function Tests:  Recent Labs Lab 01/03/13 2010 01/06/13 1005 01/07/13 0513  AST 15 14 14   ALT 13 17 15   ALKPHOS 53 65 60  BILITOT 1.4* 0.7 1.2  PROT 5.9* 5.7* 5.7*  ALBUMIN 3.0* 2.2* 2.2*    Recent Labs Lab 01/03/13 2010  LIPASE 19   CBC:  Recent Labs Lab 01/03/13 2010 01/04/13 0200  01/05/13 0330 01/07/13 0513  WBC 11.4* 17.3* 9.4 3.1*  NEUTROABS 10.5*  --   --   --   HGB 9.7* 10.2* 8.8* 7.2*  HCT 28.0* 30.1* 25.7* 21.0*  MCV 96.9 96.8 97.7 95.5  PLT 164 163 94* 90*    Recent Results (from the past 240 hour(s))  URINE CULTURE     Status: None   Collection Time    01/03/13 10:04 PM      Result Value Range Status   Specimen Description URINE, CLEAN CATCH   Final   Special Requests NONE   Final   Culture  Setup Time     Final   Value: 01/04/2013 06:32     Performed at [facility]   Culture     Final   Value: >=100,000 COLONIES/mL ESCHERICHIA COLI     Note: Confirmed Extended Spectrum Beta-Lactamase Producer (ESBL) CRITICAL RESULT  CALLED TO, READ BACK BY AND VERIFIED WITH: ALFONSO FAISON AT 5:50 A.M. ON 01/06/2013 WARRB     Performed at [facility]   Report Status 01/06/2013 FINAL   Final   Organism ID, Bacteria ESCHERICHIA COLI   Final  MRSA PCR SCREENING     Status: None   Collection Time    01/04/13  1:10 AM      Result Value Range Status   MRSA by PCR NEGATIVE  NEGATIVE Final   Comment:            The GeneXpert MRSA Assay (FDA     approved for NASAL specimens     only), is one component of a     comprehensive MRSA colonization     surveillance program. It is not     intended to diagnose MRSA     infection nor to guide or     monitor treatment for     MRSA infections.  CULTURE, BLOOD (ROUTINE X 2)     Status: None   Collection Time    01/04/13  2:00 AM      Result Value Range Status   Specimen Description BLOOD LEFT ARM   Final   Special Requests BOTTLES DRAWN AEROBIC AND ANAEROBIC 3CC   Final   Culture  Setup Time     Final   Value: 01/04/2013 08:14     Performed at [facility]   Culture     Final   Value: ESCHERICHIA COLI     Note: Confirmed Extended Spectrum Beta-Lactamase Producer (ESBL) CRITICAL RESULT CALLED TO, READ BACK BY AND VERIFIED WITH: CHLOE ADDISON 01/06/13 0825 BY [email or web address]     Note: Gram Stain Report Called to,Read Back By and Verified With: DENISE ALDRIDGE ON 01/04/2013 AT 8:53P BY WILEJ     Performed at [facility]   Report Status 01/06/2013 FINAL   Final   Organism ID, Bacteria ESCHERICHIA COLI   Final      Studies/Results: US Abdomen Complete  01/06/2013   CLINICAL DATA:  Right upper quadrant tenderness  EXAM: ULTRASOUND ABDOMEN COMPLETE  COMPARISON:  CT abdomen 01/03/2013  FINDINGS: Gallbladder  There several small dependent gallstones. No gallbladder wall thickening or pericholecystic fluid. Negative sonographic Murphy's sign. The gallbladder is mildly distended to 5 cm.  Common bile duct  Diameter: Normal at 4.8 mm.  Liver  Liver is mildly  heterogeneous in echotexture without focal lesion. No biliary duct dilatation.  IVC  No abnormality visualized.  Pancreas  Visualized portion unremarkable.  Spleen  Size and appearance within normal limits.  Right Kidney  Length: 9.2 cm. Echogenicity within normal limits. No mass or hydronephrosis visualized.  Left Kidney  Length: 9.9 cm. Echogenicity within normal limits. No mass or hydronephrosis visualized.  Abdominal aorta  No aneurysm visualized.  IMPRESSION: 1. Small gallstones within the gallbladder without evidence of acute cholecystitis. 2. Gallbladder  is mildly distended.   Electronically Signed   By: Genevive Bi [name]   On: 01/06/2013 12:15    Medications:  Scheduled: . DULoxetine  30 mg Oral BID  . ertapenem  1 g Intravenous Q24H  . feeding supplement (ENSURE)  1 Container Oral Q24H  . HYDROcodone-acetaminophen  1 tablet Oral BID  . multivitamin with minerals  1 tablet Oral Daily  . pantoprazole  40 mg Oral Q1200  . sodium bicarbonate  650 mg Oral BID  . sodium chloride  3 mL Intravenous Q12H   Continuous: . sodium chloride 100 mL/hr at 01/07/13 1024   NWG:[id number], acetaminophen, ALPRAZolam, morphine injection, ondansetron (ZOFRAN) IV, ondansetron, phenol-menthol  Assessment/Plan:  Active Problems:   Anemia   Chronic kidney disease (CKD), stage III (moderate)   Pyelonephritis   Abdominal pain   AKI (acute kidney injury)   Fever   Hypotension   Thrombocytopenia, unspecified   Pyelonephritis with Sepsis Initially admitted to SDU for hypotension. BP has improved. Remains afebrile. Urine and blood growing E coli ESBL. Continue Ertapenem. Discussed with Dr. Ninetta Lights with ID and recommendation is for 2 weeks of Ertapenem IV. Will order PICC. Incidental gallstones noted on [place] without cholecystitis. LFt normal. Patient notified. No further work up at this time. Lower IVF rate.  Hypotension See above. BP improved. Holding BP medications.   AKI on CKD III with  metabolic acidosis Improved with IVF. Bicarb improved.  Thrombocytopenia Stable. Etiology unclear. Could be due to sepsis. Stopped Pepcid as it can cause thrombocytopenia. Not on heparin products. Monitor counts.  Anemia Hgb lower today. No obvious bleeding. Possibly from dilution. Transfuse as she is complaining of weakness. Lasix in between.  Hyperkalemia Resolved.  Mild Hyponatremia Possibly from sepsis/dehydration. Stable.   Code Status: Full code  DVT Prophylaxis: SCD's    Family Communication: Discussed with patient.  Disposition Plan: PT/OT evaluation. Possible DC in AM with home antibiotics.    LOS: 4 days   [facility]  [facility] Pager [phone] 01/07/2013, 11:35 AM  If 8PM-8AM, please contact night-coverage at [email or web address], password [facility]

## 2013-01-08 ENCOUNTER — Inpatient Hospital Stay (HOSPITAL_COMMUNITY): Payer: Medicare PPO

## 2013-01-08 DIAGNOSIS — A498 Other bacterial infections of unspecified site: Secondary | ICD-10-CM

## 2013-01-08 DIAGNOSIS — R7881 Bacteremia: Secondary | ICD-10-CM

## 2013-01-08 DIAGNOSIS — Z1619 Resistance to other specified beta lactam antibiotics: Secondary | ICD-10-CM

## 2013-01-08 DIAGNOSIS — B9689 Other specified bacterial agents as the cause of diseases classified elsewhere: Secondary | ICD-10-CM

## 2013-01-08 LAB — CBC
Hemoglobin: 8.8 g/dL — ABNORMAL LOW (ref 12.0–15.0)
MCH: 32.4 pg (ref 26.0–34.0)
MCHC: 35.3 g/dL (ref 30.0–36.0)
MCV: 91.5 fL (ref 78.0–100.0)
RBC: 2.72 MIL/uL — ABNORMAL LOW (ref 3.87–5.11)

## 2013-01-08 LAB — GI PATHOGEN PANEL BY PCR, STOOL
C difficile toxin A/B: NEGATIVE
Campylobacter by PCR: NEGATIVE
E coli (ETEC) LT/ST: NEGATIVE
E coli (STEC): NEGATIVE
E coli 0157 by PCR: NEGATIVE
G lamblia by PCR: NEGATIVE
Rotavirus A by PCR: NEGATIVE

## 2013-01-08 LAB — BASIC METABOLIC PANEL
BUN: 17 mg/dL (ref 6–23)
Calcium: 9.3 mg/dL (ref 8.4–10.5)
Creatinine, Ser: 1.02 mg/dL (ref 0.50–1.10)
GFR calc non Af Amer: 53 mL/min — ABNORMAL LOW (ref >90)
Glucose, Bld: 110 mg/dL — ABNORMAL HIGH (ref 70–99)
Sodium: 134 mEq/L — ABNORMAL LOW (ref 135–145)

## 2013-01-08 MED ORDER — ONDANSETRON 4 MG PO TBDP: 4.0000 mg | ORAL_TABLET | Freq: Three times a day (TID) | ORAL | Status: DC | PRN

## 2013-01-08 MED ORDER — LISINOPRIL-HYDROCHLOROTHIAZIDE 20-12.5 MG PO TABS: 1.0000 | ORAL_TABLET | Freq: Every morning | ORAL | Status: DC

## 2013-01-08 MED ORDER — SODIUM CHLORIDE 0.9 % IV SOLN: 1.0000 g | INTRAVENOUS | Status: DC

## 2013-01-08 MED ORDER — SODIUM CHLORIDE 0.9 % IJ SOLN: 10.0000 mL | INTRAMUSCULAR | Status: DC | PRN

## 2013-01-08 MED ORDER — SODIUM BICARBONATE 650 MG PO TABS: 650.0000 mg | ORAL_TABLET | Freq: Two times a day (BID) | ORAL | Status: DC

## 2013-01-08 NOTE — Progress Notes (Signed)
While performing discharge teaching, pt and pt's husband expressed concern about her condition. Pt stated," I am not sure how I am going to make it, I have never been this weak before." Paged MD, who was made aware of pt and pt's husband concerns. MD spoke with pt and husband and stated she was ready for discharge. Patient/family received discharge instructions, follow-up appts, medication lists, and when to call the doctor. All questions answered and pt verbalizes understanding. Pt d/c home with husband. Fraser Din, RN

## 2013-01-08 NOTE — Progress Notes (Signed)
IV team RN notified the patient's RN that the PICC would be inserted this morning. The nightshift IV team also notified the RN that she would assess the patient for a peripheral IV. Will continue to monitor the patient.

## 2013-01-08 NOTE — Progress Notes (Signed)
Occupational Therapy Treatment Patient Details Name: Nicole Meyer MRN: [id number] DOB: [birth year] Today's Date: 01/08/2013 Time: 4098-1191 OT Time Calculation (min): 36 min  OT Assessment / Plan / Recommendation  History of present illness 72 y.o. female who presented with abdominal pain. Pain was in her back as well as epigastric area. Ct abdomen suggested Pyelonephritis. She was hypotensive as well. She was initially monitored in stepdown and then was subsequently transferred to the floor.   OT comments  Pt completed adl; limited by edema in hands and fatique.  Used RW to walk to bathroom:  Tended to bump into things and had difficulty maneuvering away from these  Follow Up Recommendations  SNF ( would benefit if agreeable, if not HHOT)    Barriers to Discharge       Equipment Recommendations  None recommended by OT    Recommendations for Other Services    Frequency Min 2X/week   Progress towards OT Goals Progress towards OT goals: Progressing toward goals (slowly)  Plan Discharge plan needs to be updated    Precautions / Restrictions Precautions Precautions: Fall Restrictions Weight Bearing Restrictions: No   Pertinent Vitals/Pain No pain   ADL  Upper Body Bathing: Minimal assistance Where Assessed - Upper Body Bathing: Unsupported sitting Lower Body Bathing: Minimal assistance Where Assessed - Lower Body Bathing: Supported sit to stand Upper Body Dressing: Minimal assistance Where Assessed - Upper Body Dressing: Unsupported sitting Lower Body Dressing:  (socks, max A) Toilet Transfer: Minimal assistance Toilet Transfer Method: Sit to stand Toilet Transfer Equipment: Comfort height toilet;Grab bars Toileting - Clothing Manipulation and Hygiene: Minimal assistance Where Assessed - Engineer, mining and Hygiene: Sit to stand from 3-in-1 or toilet Equipment Used: Rolling walker Transfers/Ambulation Related to ADLs: pt did not have to use bathroom  during session, after repositioning in bed, needed to use bathroom.  Pt is not used to using a walker.  She was hitting obstacles and had difficulty repositioning around.  Also, cues to keep LUE on walker:  swelling affects grip ADL Comments: Fatiques easily; continues to need assistance with washcloths    OT Diagnosis:    OT Problem List:   OT Treatment Interventions:     OT Goals(current goals can now be found in the care plan section)    Visit Information      Subjective Data      Prior Functioning       Cognition  Cognition Arousal/Alertness: Awake/alert Behavior During Therapy: WFL for tasks assessed/performed Overall Cognitive Status: Within Functional Limits for tasks assessed    Mobility  Bed Mobility Supine to Sit: 5: Supervision;With rails Sit to Supine: 5: Supervision;With rail Transfers Sit to Stand: 5: Supervision;From chair/3-in-1;From toilet;With upper extremity assist    Exercises      Balance     End of Session OT - End of Session Activity Tolerance: Patient limited by fatigue Patient left: in bed;with call bell/phone within reach (PICC RN came:  will put alarm on when they are done)  GO     Nicole Meyer 01/08/2013, 8:56 AM Marica Otter, OTR/L [phone] 01/08/2013

## 2013-01-08 NOTE — Progress Notes (Signed)
ECG shows PICC to be in good position.  Pt. Has shown PVC's.  CXR done showing PICC to be in CAJ (good position).  Spoke with Dr. Rito Ehrlich and order PICC pull back 2cm. This should place the PICC in lower SVC

## 2013-01-08 NOTE — Discharge Summary (Addendum)
[facility]  Physician Discharge Summary   Patient ID: Nicole Meyer MRN: [id number] DOB/AGE: June 02, 1940 72 y.o.  Admit date: 01/03/2013 Discharge date: 01/08/2013  PCP: Thora Lance, MD  DISCHARGE DIAGNOSES:  Active Problems:   Anemia   Chronic kidney disease (CKD), stage III (moderate)   Pyelonephritis   Abdominal pain   Thrombocytopenia, unspecified   ESBL (extended spectrum beta-lactamase) producing bacteria infection   E coli bacteremia   RECOMMENDATIONS FOR OUTPATIENT FOLLOW UP: 1. Patient will need Ertapenem for ESBL E Coli for 11 more days.  2. Home health RN, PT and OT has been set up.  DISCHARGE CONDITION: fair  Diet recommendation: Low Sodium  Filed Weights   01/04/13 0100  Weight: 72.8 kg (160 lb 7.9 oz)    INITIAL HISTORY: Nicole Meyer is a 72 y.o. female who presented with abdominal pain. Pain was in her back as well as epigastric area. Ct abdomen suggested Pyelonephritis. She was hypotensive as well. She was initially monitored in stepdown and then was subsequently transferred to the floor.  Consultations:  None  Procedures: None  HOSPITAL COURSE:   Acute Pyelonephritis with Sepsis  She was initially admitted to stepdown for hypotension. BP improved with fluids. Urine and blood growing E coli ESBL. She was initially started on Ceftriaxone which was changed over to Ertapenem. Discussed with Dr. Ninetta Lights with ID and recommendation is for 2 weeks of Ertapenem IV. PICC was placed this morning. Home health has been arranged. She is stable for discharge.  PVC's She was noted to have PVC's this morning. This was apparent after PICC was placed. A CXR was obtained and PICC was retracted by 2 cm. With this the PVC's appear to have abated. She remained asymptomatic. ECHO from 1013 showed normal EF. No further evaluation at this time.  Hypotension  See above. BP improved with fluids. BP medications were held and can be resumed next  week.  AKI on CKD III with metabolic acidosis  Creatinine was 1.94. She was given fluids and it is now closel to her baseline. She will take bicarb for 10 more days.   Thrombocytopenia  Counts have improved. Etiology unclear. Could be due to sepsis. Stopped Pepcid as it can cause thrombocytopenia. She was not on heparin products.   Anemia  Hgb has been low. No overt bleeding. This is likely due to dilution and acute illness. Can be pursued as OP. She was transfused as she was complaining of weakness and hgb responded appropriately.   Hyperkalemia  Resolved  Mild Hyponatremia  Possibly from sepsis/dehydration. Stable.   Cholelithiasis Incidental gallstones noted on [place] without cholecystitis. LFt normal. Patient notified. No further work up at this time.  Patient is stable for discharge with home health.   PERTINENT LABS:  The results of significant diagnostics from this hospitalization (including imaging, microbiology, ancillary and laboratory) are listed below for reference.    Microbiology: Recent Results (from the past 240 hour(s))  URINE CULTURE     Status: None   Collection Time    01/03/13 10:04 PM      Result Value Range Status   Specimen Description URINE, CLEAN CATCH   Final   Special Requests NONE   Final   Culture  Setup Time     Final   Value: 01/04/2013 06:32     Performed at [facility]   Culture     Final   Value: >=100,000 COLONIES/mL ESCHERICHIA COLI     Note: Confirmed Extended Spectrum Beta-Lactamase  Producer (ESBL) CRITICAL RESULT CALLED TO, READ BACK BY AND VERIFIED WITH: ALFONSO FAISON AT 5:50 A.M. ON 01/06/2013 WARRB     Performed at [facility]   Report Status 01/06/2013 FINAL   Final   Organism ID, Bacteria ESCHERICHIA COLI   Final  MRSA PCR SCREENING     Status: None   Collection Time    01/04/13  1:10 AM      Result Value Range Status   MRSA by PCR NEGATIVE  NEGATIVE Final   Comment:            The GeneXpert MRSA Assay (FDA      approved for NASAL specimens     only), is one component of a     comprehensive MRSA colonization     surveillance program. It is not     intended to diagnose MRSA     infection nor to guide or     monitor treatment for     MRSA infections.  CULTURE, BLOOD (ROUTINE X 2)     Status: None   Collection Time    01/04/13  2:00 AM      Result Value Range Status   Specimen Description BLOOD LEFT ARM   Final   Special Requests BOTTLES DRAWN AEROBIC AND ANAEROBIC 3CC   Final   Culture  Setup Time     Final   Value: 01/04/2013 08:14     Performed at [facility]   Culture     Final   Value: ESCHERICHIA COLI     Note: Confirmed Extended Spectrum Beta-Lactamase Producer (ESBL) CRITICAL RESULT CALLED TO, READ BACK BY AND VERIFIED WITH: CHLOE ADDISON 01/06/13 0825 BY SMITHERSJ     Note: Gram Stain Report Called to,Read Back By and Verified With: DENISE ALDRIDGE ON 01/04/2013 AT 8:53P BY WILEJ     Performed at [facility]   Report Status 01/06/2013 FINAL   Final   Organism ID, Bacteria ESCHERICHIA COLI   Final     Labs: Basic Metabolic Panel:  Recent Labs Lab 01/04/13 0200 01/04/13 1807 01/05/13 0330 01/07/13 0513 01/08/13 0815  NA 133* 134* 134* 133* 134*  K 5.3* 4.5 4.6 4.2 4.0  CL 105 109 111 106 102  CO2 18* 17* 16* 19 21  GLUCOSE 124* 106* 128* 98 110*  BUN 38* 33* 28* 14 17  CREATININE 1.85* 1.49* 1.26* 1.09 1.02  CALCIUM 9.0 8.4 8.0* 8.6 9.3  MG  --   --   --   --  1.5   Liver Function Tests:  Recent Labs Lab 01/03/13 2010 01/06/13 1005 01/07/13 0513  AST 15 14 14   ALT 13 17 15   ALKPHOS 53 65 60  BILITOT 1.4* 0.7 1.2  PROT 5.9* 5.7* 5.7*  ALBUMIN 3.0* 2.2* 2.2*    Recent Labs Lab 01/03/13 2010  LIPASE 19   CBC:  Recent Labs Lab 01/03/13 2010 01/04/13 0200 01/05/13 0330 01/07/13 0513 01/08/13 0815  WBC 11.4* 17.3* 9.4 3.1* 5.1  NEUTROABS 10.5*  --   --   --   --   HGB 9.7* 10.2* 8.8* 7.2* 8.8*  HCT 28.0* 30.1* 25.7* 21.0*  24.9*  MCV 96.9 96.8 97.7 95.5 91.5  PLT 164 163 94* 90* 137*    IMAGING STUDIES Ct Abdomen Pelvis Wo Contrast  01/03/2013   CLINICAL DATA:  Abdominal pain  EXAM: CT ABDOMEN AND PELVIS WITHOUT CONTRAST  TECHNIQUE: Multidetector CT imaging of the abdomen and pelvis was performed following the  standard protocol without intravenous contrast.  COMPARISON:  12/24/2011  FINDINGS: BODY WALL: Unremarkable.  LOWER CHEST: Diffuse coronary artery atherosclerosis. Circumferential distal esophageal thickening, similar to prior. Interval reduction of hiatal hernia, with Nissen fundoplication. Mild subpleural lung reticulation.  ABDOMEN/PELVIS:  Liver: Granulomatous changes.  Biliary: No evidence of biliary obstruction or stone.  Pancreas: Unremarkable.  Spleen: Unremarkable.  Adrenals: Unremarkable.  Kidneys and ureters: Asymmetric left perinephric edema, with thickening of the perirenal fascia, new from prior. No stone or hydronephrosis. No cyst on previous imaging.  Bladder: Mild distention. Gas in the urinary bladder, often from urinalysis.  Reproductive: Hysterectomy.  Probable salpingo oophorectomies.  Bowel: No obstruction. Colonic diverticulosis. No pericecal inflammation.  Retroperitoneum: No mass or adenopathy.  Peritoneum: No free fluid or gas.  Vascular: No acute abnormality.  OSSEOUS: No acute abnormalities.  IMPRESSION: Left perinephric edema, usually from pyelonephritis or recently passed ureteral stone.   Electronically Signed   By: Tiburcio Pea [name]   On: 01/03/2013 21:31   US Abdomen Complete  01/06/2013   CLINICAL DATA:  Right upper quadrant tenderness  EXAM: ULTRASOUND ABDOMEN COMPLETE  COMPARISON:  CT abdomen 01/03/2013  FINDINGS: Gallbladder  There several small dependent gallstones. No gallbladder wall thickening or pericholecystic fluid. Negative sonographic Murphy's sign. The gallbladder is mildly distended to 5 cm.  Common bile duct  Diameter: Normal at 4.8 mm.  Liver  Liver is mildly  heterogeneous in echotexture without focal lesion. No biliary duct dilatation.  IVC  No abnormality visualized.  Pancreas  Visualized portion unremarkable.  Spleen  Size and appearance within normal limits.  Right Kidney  Length: 9.2 cm. Echogenicity within normal limits. No mass or hydronephrosis visualized.  Left Kidney  Length: 9.9 cm. Echogenicity within normal limits. No mass or hydronephrosis visualized.  Abdominal aorta  No aneurysm visualized.  IMPRESSION: 1. Small gallstones within the gallbladder without evidence of acute cholecystitis. 2. Gallbladder  is mildly distended.   Electronically Signed   By: Genevive Bi [name]   On: 01/06/2013 12:15   Dg Chest Port 1 View  01/08/2013   CLINICAL DATA:  PICC line insertion.  EXAM: PORTABLE CHEST - 1 VIEW  COMPARISON:  01/05/2013  FINDINGS: PICC line tip cavoatrial junction/ proximal right atrium level. To be within the distal superior vena cava, this can be retracted by 2.5 cm.  No gross pneumothorax.  Remote left 4th rib fracture.  Left base subsegmental atelectasis.  Pulmonary vascular prominence most notable centrally.  Heart size top-normal.  Minimally tortuous ascending thoracic aorta.  IMPRESSION: PICC line tip cavoatrial junction/ proximal right atrium level. To be within the distal superior vena cava, this can be retracted by 2.5 cm.  Left base subsegmental atelectasis.  Pulmonary vascular prominence most notable centrally.  These results will be called to the ordering clinician or representative by the Radiologist Assistant, and communication documented in the PACS Dashboard.   Electronically Signed   By: Bridgett Larsson [name]   On: 01/08/2013 10:31   Dg Chest Port 1 View  01/05/2013   CLINICAL DATA:  Recent surgery for hiatal hernia repair. Nausea and vomiting.  EXAM: PORTABLE CHEST - 1 VIEW  COMPARISON:  Chest radiograph 04/08/2012.  FINDINGS: Stable cardiac and mediastinal contours. Minimal heterogeneous opacities left lung base. Blunting of the  left costophrenic angle. No definite pneumothorax. Re- demonstrated old left lateral rib fracture.  IMPRESSION: Minimal heterogeneous opacities left lung base may represent atelectasis or scarring. Blunting of the left costophrenic angle may represent small amount of  pleural fluid versus thickening.   Electronically Signed   By: Annia Belt [name]   On: 01/05/2013 09:05    DISCHARGE EXAMINATION: Filed Vitals:   01/08/13 0320 01/08/13 0420 01/08/13 0520 01/08/13 0535  BP: 131/55 135/51 127/59 145/63  Pulse: 68 71 73 67  Temp: 98.2 F (36.8 C) 98.7 F (37.1 C) 98.2 F (36.8 C) 98.2 F (36.8 C)  TempSrc: Oral Oral Oral Oral  Resp: 18 16 18 16   Height:      Weight:      SpO2: 98% 99% 99% 97%   General appearance: alert, cooperative, appears stated age and no distress Resp: clear to auscultation bilaterally Cardio: regular rate and rhythm, S1, S2 normal, no murmur, click, rub or gallop GI: soft, non-tender; bowel sounds normal; no masses,  no organomegaly Neurologic: Grossly normal  DISPOSITION: Home with husband. Home health arranged.  Discharge Orders   Future Appointments Provider Department Dept Phone   01/21/2013 7:30 AM [facility] [phone]   NPO after midnight, and off all stomach meds 24 hrs prior to exam   03/08/2013 10:00 AM Delcie Roch [facility] [phone]   03/08/2013 10:30 AM [facility] [phone]   Future Orders Complete By Expires   Diet - low sodium heart healthy  As directed    Discharge instructions  As directed    Comments:     Please follow up with Thora Lance, MD next week.   Increase activity slowly  As directed       ALLERGIES:  Allergies  Allergen Reactions  . Penicillins Hives  . Brovana [Arformoterol] Other (See Comments)    "makes me nervous"  . Paxil [Paroxetine Hcl] Rash    Current Discharge  Medication List    START taking these medications   Details  ondansetron (ZOFRAN-ODT) 4 MG disintegrating tablet Take 1 tablet (4 mg total) by mouth every 8 (eight) hours as needed for nausea or vomiting. Qty: 20 tablet, Refills: 0    sodium bicarbonate 650 MG tablet Take 1 tablet (650 mg total) by mouth 2 (two) times daily. FOR 10 DAYS Qty: 20 tablet, Refills: 0    sodium chloride 0.9 % SOLN 50 mL with ertapenem 1 G SOLR 1 g Inject 1 g into the vein daily. For 11 more days Qty: 11 each, Refills: 0      CONTINUE these medications which have CHANGED   Details  lisinopril-hydrochlorothiazide (PRINZIDE,ZESTORETIC) 20-12.5 MG per tablet Take 1 tablet by mouth every morning. PLEASE RESUME NEXT WEEK.      CONTINUE these medications which have NOT CHANGED   Details  albuterol (PROVENTIL) (2.5 MG/3ML) 0.083% nebulizer solution Take 2.5 mg by nebulization every 6 (six) hours as needed for wheezing or shortness of breath. Wheezing and shortness of breath   Associated Diagnoses: Anemia; Leukocytopenia; HTN (hypertension); Asthma; IBS (irritable bowel syndrome); Arthritis; Anxiety; Chronic kidney disease (CKD), stage III (moderate); Hypercalcemia; Fatigue; Lymphopenia    ALPRAZolam (XANAX) 0.5 MG tablet Take 0.5 mg by mouth at bedtime as needed for anxiety or sleep.     aspirin EC 81 MG tablet Take 81 mg by mouth daily.    beta carotene w/minerals (OCUVITE) tablet Take 1 tablet by mouth daily.    calcium-vitamin D (OSCAL WITH D) 500-200 MG-UNIT per tablet Take 1 tablet by mouth 2 (two) times daily.    DULoxetine (CYMBALTA) 30 MG capsule Take 30 mg by  mouth 2 (two) times daily.    HYDROcodone-acetaminophen (NORCO/VICODIN) 5-325 MG per tablet Take 1 tablet by mouth 2 (two) times daily.    Multiple Vitamin (MULTIVITAMIN) tablet Take 1 tablet by mouth daily.   Associated Diagnoses: Anemia; Leukocytopenia; HTN (hypertension); Asthma; IBS (irritable bowel syndrome); Arthritis; Anxiety; Chronic  kidney disease (CKD), stage III (moderate); Hypercalcemia; Fatigue; Lymphopenia    Nutritional Supplements (ESTROVEN PO) Take 1 tablet by mouth daily.    ranitidine (ZANTAC) 150 MG tablet Take 150 mg by mouth daily.       Follow-up Information   Follow up with Thora Lance, MD. Schedule an appointment as soon as possible for a visit in 4 days.   Specialty:  Family Medicine   Contact information:   [address], [address] [place] [address] [phone]       TOTAL DISCHARGE TIME: 35 mins  [facility]  [facility] Pager [phone]  01/08/2013, 12:36 PM  Called by Rn that patient was concerned about going home. Spoke to patient and husband. Assured them of her medical stability. Told her that weakness will take time to improve. Offered SNF but she didn't want that option. She and husband finally decided to go home.  Osvaldo Shipper

## 2013-01-08 NOTE — Progress Notes (Signed)
Peripherally Inserted Central Catheter/Midline Placement  The IV Nurse has discussed with the patient and/or persons authorized to consent for the patient, the purpose of this procedure and the potential benefits and risks involved with this procedure.  The benefits include less needle sticks, lab draws from the catheter and patient may be discharged home with the catheter.  Risks include, but not limited to, infection, bleeding, blood clot (thrombus formation), and puncture of an artery; nerve damage and irregular heat beat.  Alternatives to this procedure were also discussed.  PICC/Midline Placement Documentation        Stacie Glaze Horton 01/08/2013, 8:57 AM

## 2013-01-10 LAB — TYPE AND SCREEN
Antibody Screen: NEGATIVE
Unit division: 0

## 2013-01-11 ENCOUNTER — Ambulatory Visit: Payer: Self-pay | Admitting: Podiatry

## 2013-01-13 ENCOUNTER — Other Ambulatory Visit: Payer: Self-pay | Admitting: Family Medicine

## 2013-01-13 ENCOUNTER — Ambulatory Visit
Admission: RE | Admit: 2013-01-13 | Discharge: 2013-01-13 | Disposition: A | Discharge status: Home / Self Care | Payer: Medicare PPO | Source: Ambulatory Visit | Attending: Family Medicine | Admitting: Family Medicine

## 2013-01-13 DIAGNOSIS — R52 Pain, unspecified: Secondary | ICD-10-CM

## 2013-01-21 ENCOUNTER — Ambulatory Visit (HOSPITAL_COMMUNITY): Payer: Medicare PPO

## 2013-01-26 ENCOUNTER — Telehealth: Payer: Self-pay | Admitting: *Deleted

## 2013-01-26 NOTE — Telephone Encounter (Signed)
lm informed the pt that the arrival time had changed. gv appt for 03/08/13 w/ labs@ 2:30p and ov@ 3p. Pt is aware i would mail a letter/avs...td

## 2013-01-27 ENCOUNTER — Other Ambulatory Visit: Payer: Self-pay | Admitting: Hematology and Oncology

## 2013-01-27 ENCOUNTER — Telehealth: Payer: Self-pay | Admitting: Hematology and Oncology

## 2013-01-27 DIAGNOSIS — D649 Anemia, unspecified: Secondary | ICD-10-CM

## 2013-01-27 DIAGNOSIS — D72819 Decreased white blood cell count, unspecified: Secondary | ICD-10-CM

## 2013-01-27 NOTE — Telephone Encounter (Signed)
s/w pt re lb/NG 12/11 @ 11am.

## 2013-01-28 ENCOUNTER — Ambulatory Visit (HOSPITAL_BASED_OUTPATIENT_CLINIC_OR_DEPARTMENT_OTHER): Payer: Medicare PPO | Admitting: Hematology and Oncology

## 2013-01-28 ENCOUNTER — Other Ambulatory Visit (HOSPITAL_BASED_OUTPATIENT_CLINIC_OR_DEPARTMENT_OTHER): Payer: Medicare PPO

## 2013-01-28 ENCOUNTER — Encounter: Payer: Self-pay | Admitting: Hematology and Oncology

## 2013-01-28 VITALS — BP 153/65 | HR 96 | Temp 98.2°F | Resp 18 | Ht 63.0 in | Wt 156.3 lb

## 2013-01-28 DIAGNOSIS — D649 Anemia, unspecified: Secondary | ICD-10-CM

## 2013-01-28 DIAGNOSIS — I10 Essential (primary) hypertension: Secondary | ICD-10-CM

## 2013-01-28 DIAGNOSIS — D72819 Decreased white blood cell count, unspecified: Secondary | ICD-10-CM

## 2013-01-28 LAB — CBC & DIFF AND RETIC
BASO%: 0.4 % (ref 0.0–2.0)
EOS%: 1.6 % (ref 0.0–7.0)
Immature Retic Fract: 11.4 % — ABNORMAL HIGH (ref 1.60–10.00)
LYMPH%: 11.2 % — ABNORMAL LOW (ref 14.0–49.7)
MCH: 32.5 pg (ref 25.1–34.0)
MCHC: 32 g/dL (ref 31.5–36.0)
MONO%: 6.9 % (ref 0.0–14.0)
NEUT#: 4.1 10*3/uL (ref 1.5–6.5)
NEUT%: 79.9 % — ABNORMAL HIGH (ref 38.4–76.8)
RBC: 2.77 10*6/uL — ABNORMAL LOW (ref 3.70–5.45)
RDW: 16.2 % — ABNORMAL HIGH (ref 11.2–14.5)
Retic %: 5.69 % — ABNORMAL HIGH (ref 0.70–2.10)
Retic Ct Abs: 157.61 10*3/uL — ABNORMAL HIGH (ref 33.70–90.70)
lymph#: 0.6 10*3/uL — ABNORMAL LOW (ref 0.9–3.3)

## 2013-01-28 LAB — COMPREHENSIVE METABOLIC PANEL (CC13)
ALT: 16 U/L (ref 0–55)
AST: 20 U/L (ref 5–34)
Alkaline Phosphatase: 74 U/L (ref 40–150)
BUN: 15.4 mg/dL (ref 7.0–26.0)
Calcium: 9.7 mg/dL (ref 8.4–10.4)
Chloride: 108 mEq/L (ref 98–109)
Creatinine: 1.2 mg/dL — ABNORMAL HIGH (ref 0.6–1.1)
Glucose: 127 mg/dl (ref 70–140)

## 2013-01-28 LAB — FERRITIN CHCC: Ferritin: 1303 ng/ml — ABNORMAL HIGH (ref 9–269)

## 2013-01-28 LAB — HOLD TUBE, BLOOD BANK

## 2013-01-28 LAB — LACTATE DEHYDROGENASE (CC13): LDH: 239 U/L (ref 125–245)

## 2013-01-28 NOTE — Progress Notes (Signed)
[facility] OFFICE PROGRESS NOTE  Thora Lance, MD DIAGNOSIS:  Severe anemia  SUMMARY OF HEMATOLOGIC HISTORY: This is a pleasant 72 year old lady who was seen here last in May of 2014 chronic anemia. In July of 2013, she had a bone marrow aspirate and biopsy that came back unremarkable. The patient was recommended to take oral iron supplements.  INTERVAL HISTORY: Nicole Meyer 72 y.o. female returns for further followup. The patient was admitted to the hospital last month for pyelonephritis. She was also profoundly anemic requiring blood transfusion. I received some information from recent blood work which show that she have severe anemia of 7.8 on 01/18/2013. Prior to that, on 01/11/2013, she remains anemic with a hemoglobin of 7.9. The patient denies any recent signs or symptoms of bleeding such as spontaneous epistaxis, hematuria or hematochezia. The patient complained of feeling fatigued. Denies any chest pain, shortness of breath, or dizziness. No leg cramps.  I have reviewed the past medical history, past surgical history, social history and family history with the patient and they are unchanged from previous note.  ALLERGIES:  is allergic to penicillins; brovana; and paxil.  MEDICATIONS:  Current Outpatient Prescriptions  Medication Sig Dispense Refill  . albuterol (PROVENTIL) (2.5 MG/3ML) 0.083% nebulizer solution Take 2.5 mg by nebulization every 6 (six) hours as needed for wheezing or shortness of breath. Wheezing and shortness of breath      . ALPRAZolam (XANAX) 0.5 MG tablet Take 0.5 mg by mouth at bedtime as needed for anxiety or sleep.       [name] aspirin EC 81 MG tablet Take 81 mg by mouth daily.      . beta carotene w/minerals (OCUVITE) tablet Take 1 tablet by mouth daily.      . calcium-vitamin D (OSCAL WITH D) 500-200 MG-UNIT per tablet Take 1 tablet by mouth 2 (two) times daily.      . DULoxetine (CYMBALTA) 30 MG capsule Take 30 mg by mouth 2 (two)  times daily.      [name] HYDROcodone-acetaminophen (NORCO/VICODIN) 5-325 MG per tablet Take 1 tablet by mouth 2 (two) times daily.      [name] lisinopril-hydrochlorothiazide (PRINZIDE,ZESTORETIC) 20-12.5 MG per tablet Take 1 tablet by mouth every morning. PLEASE RESUME NEXT WEEK.      . Nutritional Supplements (ESTROVEN PO) Take 1 tablet by mouth daily.      . ondansetron (ZOFRAN-ODT) 4 MG disintegrating tablet Take 1 tablet (4 mg total) by mouth every 8 (eight) hours as needed for nausea or vomiting.  20 tablet  0  . ranitidine (ZANTAC) 150 MG tablet Take 150 mg by mouth daily.      . Multiple Vitamin (MULTIVITAMIN) tablet Take 1 tablet by mouth daily.      . sodium chloride 0.9 % SOLN 50 mL with ertapenem 1 G SOLR 1 g Inject 1 g into the vein daily. For 11 more days  11 each  0   No current facility-administered medications for this visit.     REVIEW OF SYSTEMS:   Constitutional: Denies fevers, chills or night sweats Eyes: Denies blurriness of vision Ears, nose, mouth, throat, and face: Denies mucositis or sore throat Respiratory: Denies cough, dyspnea or wheezes Cardiovascular: Denies palpitation, chest discomfort or lower extremity swelling Gastrointestinal:  Denies nausea, heartburn or change in bowel habits Skin: Denies abnormal skin rashes Lymphatics: Denies new lymphadenopathy or easy bruising Neurological:Denies numbness, tingling or new weaknesses Behavioral/Psych: Mood is stable, no new changes  All other systems were reviewed  with the patient and are negative.  PHYSICAL EXAMINATION: ECOG PERFORMANCE STATUS: 1 - Symptomatic but completely ambulatory  Filed Vitals:   01/28/13 1131  BP: 153/65  Pulse: 96  Temp: 98.2 F (36.8 C)  Resp: 18   Filed Weights   01/28/13 1131  Weight: 156 lb 4.8 oz (70.897 kg)    GENERAL:alert, no distress and comfortable. She looks a bit pale SKIN: skin color, texture, turgor are normal, no rashes or significant lesions EYES: normal, Conjunctiva  are pink and non-injected, sclera clear OROPHARYNX:no exudate, no erythema and lips, buccal mucosa, and tongue normal  NECK: supple, thyroid normal size, non-tender, without nodularity LYMPH:  no palpable lymphadenopathy in the cervical, axillary or inguinal LUNGS: clear to auscultation and percussion with normal breathing effort HEART: regular rate & rhythm and no murmurs and no lower extremity edema ABDOMEN:abdomen soft, non-tender and normal bowel sounds Musculoskeletal:no cyanosis of digits and no clubbing  NEURO: alert & oriented x 3 with fluent speech, no focal motor/sensory deficits  LABORATORY DATA:  I have reviewed the data as listed Results for orders placed in visit on 01/28/13 (from the past 48 hour(s))  CBC & DIFF AND RETIC     Status: Abnormal   Collection Time    01/28/13 11:10 AM      Result Value Range   WBC 5.1  3.9 - 10.3 10e3/uL   NEUT# 4.1  1.5 - 6.5 10e3/uL   HGB 9.0 (*) 11.6 - 15.9 g/dL   HCT 02.7 (*) 25.3 - 66.4 %   Platelets 242  145 - 400 10e3/uL   MCV 101.4 (*) 79.5 - 101.0 fL   MCH 32.5  25.1 - 34.0 pg   MCHC 32.0  31.5 - 36.0 g/dL   RBC 4.03 (*) 4.74 - 2.59 10e6/uL   RDW 16.2 (*) 11.2 - 14.5 %   lymph# 0.6 (*) 0.9 - 3.3 10e3/uL   MONO# 0.4  0.1 - 0.9 10e3/uL   Eosinophils Absolute 0.1  0.0 - 0.5 10e3/uL   Basophils Absolute 0.0  0.0 - 0.1 10e3/uL   NEUT% 79.9 (*) 38.4 - 76.8 %   LYMPH% 11.2 (*) 14.0 - 49.7 %   MONO% 6.9  0.0 - 14.0 %   EOS% 1.6  0.0 - 7.0 %   BASO% 0.4  0.0 - 2.0 %   Retic % 5.69 (*) 0.70 - 2.10 %   Retic Ct Abs 157.61 (*) 33.70 - 90.70 10e3/uL   Immature Retic Fract 11.40 (*) 1.60 - 10.00 %  LACTATE DEHYDROGENASE (CC13)     Status: None   Collection Time    01/28/13 11:10 AM      Result Value Range   LDH 239  125 - 245 U/L  HOLD TUBE, BLOOD BANK     Status: None   Collection Time    01/28/13 11:10 AM      Result Value Range   Hold Tube, Blood Bank Blood Bank Order Cancelled    [facility] SMEAR     Status: None   Collection  Time    01/28/13 11:10 AM      Result Value Range   Smear Result Smear Available    COMPREHENSIVE METABOLIC PANEL (CC13)     Status: Abnormal   Collection Time    01/28/13 11:10 AM      Result Value Range   Sodium 139  136 - 145 mEq/L   Potassium 4.1  3.5 - 5.1 mEq/L   Chloride 108  98 -  109 mEq/L   CO2 20 (*) 22 - 29 mEq/L   Glucose 127  70 - 140 mg/dl   BUN 69.6  7.0 - 29.5 mg/dL   Creatinine 1.2 (*) 0.6 - 1.1 mg/dL   Total Bilirubin 2.84 (*) 0.20 - 1.20 mg/dL   Alkaline Phosphatase 74  40 - 150 U/L   AST 20  5 - 34 U/L   ALT 16  0 - 55 U/L   Total Protein 7.6  6.4 - 8.3 g/dL   Albumin 3.6  3.5 - 5.0 g/dL   Calcium 9.7  8.4 - 13.2 mg/dL   Anion Gap 11  3 - 11 mEq/L  FERRITIN [facility]     Status: Abnormal   Collection Time    01/28/13 11:10 AM      Result Value Range   Ferritin 1,303 (*) 9 - 269 ng/ml    Lab Results  Component Value Date   WBC 5.1 01/28/2013   HGB 9.0* 01/28/2013   HCT 28.1* 01/28/2013   MCV 101.4* 01/28/2013   PLT 242 01/28/2013    ASSESSMENT & PLAN:  #1 macrocytic anemia Differential diagnosis could be hemolysis. Bone marrow biopsy was nondiagnostic. That could be also an element of anemia chronic disease. I have ordered extensive workup. The patient denies recent history of bleeding such as epistaxis, hematuria or hematochezia. She is asymptomatic from the anemia. We will observe for now.  She does not require transfusion now.  #2 recent sepsis This has resolved. She has completed her course of antibiotic #3 history of leukopenia This has resolved #4 hypertension She will continue followup with her primary care provider for blood pressure medication adjustment #5 mild weight loss This is due to recent sepsis. We will monitor carefully.  #6 iron overload  Her ferritin level is very high but that could be due to recent infection. I will repeat iron studies in the next visit.  All questions were answered. The patient knows to call the clinic with any  problems, questions or concerns. No barriers to learning was detected.  Kammi Hechler, MD 01/28/2013 1:22 PM

## 2013-01-29 LAB — ERYTHROPOIETIN: Erythropoietin: 30.8 m[IU]/mL — ABNORMAL HIGH (ref 2.6–18.5)

## 2013-01-29 LAB — DIRECT ANTIGLOBULIN TEST (NOT AT ARMC)
DAT (Complement): NEGATIVE
DAT IgG: NEGATIVE

## 2013-01-29 LAB — VITAMIN B12: Vitamin B-12: 388 pg/mL (ref 211–911)

## 2013-02-12 ENCOUNTER — Emergency Department (HOSPITAL_COMMUNITY): Payer: Medicare PPO

## 2013-02-12 ENCOUNTER — Inpatient Hospital Stay (HOSPITAL_COMMUNITY): Payer: Medicare PPO

## 2013-02-12 ENCOUNTER — Inpatient Hospital Stay (HOSPITAL_COMMUNITY)
Admission: EM | Admit: 2013-02-12 | Discharge: 2013-02-15 | DRG: 690 | Disposition: A | Discharge status: Home Health | Payer: Medicare PPO | Attending: Family Medicine | Admitting: Family Medicine

## 2013-02-12 ENCOUNTER — Encounter (HOSPITAL_COMMUNITY): Payer: Self-pay | Admitting: Emergency Medicine

## 2013-02-12 DIAGNOSIS — D696 Thrombocytopenia, unspecified: Secondary | ICD-10-CM

## 2013-02-12 DIAGNOSIS — J4489 Other specified chronic obstructive pulmonary disease: Secondary | ICD-10-CM | POA: Diagnosis present

## 2013-02-12 DIAGNOSIS — I10 Essential (primary) hypertension: Secondary | ICD-10-CM

## 2013-02-12 DIAGNOSIS — R42 Dizziness and giddiness: Secondary | ICD-10-CM

## 2013-02-12 DIAGNOSIS — R079 Chest pain, unspecified: Secondary | ICD-10-CM

## 2013-02-12 DIAGNOSIS — R5383 Other fatigue: Secondary | ICD-10-CM

## 2013-02-12 DIAGNOSIS — M1990 Unspecified osteoarthritis, unspecified site: Secondary | ICD-10-CM

## 2013-02-12 DIAGNOSIS — F329 Major depressive disorder, single episode, unspecified: Secondary | ICD-10-CM | POA: Diagnosis present

## 2013-02-12 DIAGNOSIS — K589 Irritable bowel syndrome without diarrhea: Secondary | ICD-10-CM | POA: Diagnosis present

## 2013-02-12 DIAGNOSIS — D509 Iron deficiency anemia, unspecified: Secondary | ICD-10-CM | POA: Diagnosis present

## 2013-02-12 DIAGNOSIS — N390 Urinary tract infection, site not specified: Secondary | ICD-10-CM

## 2013-02-12 DIAGNOSIS — Z7982 Long term (current) use of aspirin: Secondary | ICD-10-CM

## 2013-02-12 DIAGNOSIS — D649 Anemia, unspecified: Secondary | ICD-10-CM

## 2013-02-12 DIAGNOSIS — R109 Unspecified abdominal pain: Secondary | ICD-10-CM

## 2013-02-12 DIAGNOSIS — F419 Anxiety disorder, unspecified: Secondary | ICD-10-CM

## 2013-02-12 DIAGNOSIS — I129 Hypertensive chronic kidney disease with stage 1 through stage 4 chronic kidney disease, or unspecified chronic kidney disease: Secondary | ICD-10-CM | POA: Diagnosis present

## 2013-02-12 DIAGNOSIS — K429 Umbilical hernia without obstruction or gangrene: Secondary | ICD-10-CM

## 2013-02-12 DIAGNOSIS — F411 Generalized anxiety disorder: Secondary | ICD-10-CM | POA: Diagnosis present

## 2013-02-12 DIAGNOSIS — D72819 Decreased white blood cell count, unspecified: Secondary | ICD-10-CM

## 2013-02-12 DIAGNOSIS — J449 Chronic obstructive pulmonary disease, unspecified: Secondary | ICD-10-CM

## 2013-02-12 DIAGNOSIS — K5900 Constipation, unspecified: Secondary | ICD-10-CM | POA: Diagnosis not present

## 2013-02-12 DIAGNOSIS — M810 Age-related osteoporosis without current pathological fracture: Secondary | ICD-10-CM | POA: Diagnosis present

## 2013-02-12 DIAGNOSIS — Z87891 Personal history of nicotine dependence: Secondary | ICD-10-CM

## 2013-02-12 DIAGNOSIS — R55 Syncope and collapse: Secondary | ICD-10-CM | POA: Diagnosis present

## 2013-02-12 DIAGNOSIS — D638 Anemia in other chronic diseases classified elsewhere: Secondary | ICD-10-CM | POA: Diagnosis present

## 2013-02-12 DIAGNOSIS — E860 Dehydration: Secondary | ICD-10-CM | POA: Diagnosis present

## 2013-02-12 DIAGNOSIS — K440 Diaphragmatic hernia with obstruction, without gangrene: Secondary | ICD-10-CM

## 2013-02-12 DIAGNOSIS — N12 Tubulo-interstitial nephritis, not specified as acute or chronic: Principal | ICD-10-CM | POA: Diagnosis present

## 2013-02-12 DIAGNOSIS — N183 Chronic kidney disease, stage 3 unspecified: Secondary | ICD-10-CM | POA: Diagnosis present

## 2013-02-12 DIAGNOSIS — M129 Arthropathy, unspecified: Secondary | ICD-10-CM | POA: Diagnosis present

## 2013-02-12 DIAGNOSIS — R110 Nausea: Secondary | ICD-10-CM

## 2013-02-12 DIAGNOSIS — D589 Hereditary hemolytic anemia, unspecified: Secondary | ICD-10-CM | POA: Diagnosis present

## 2013-02-12 DIAGNOSIS — K219 Gastro-esophageal reflux disease without esophagitis: Secondary | ICD-10-CM | POA: Diagnosis present

## 2013-02-12 DIAGNOSIS — R7881 Bacteremia: Secondary | ICD-10-CM

## 2013-02-12 DIAGNOSIS — A499 Bacterial infection, unspecified: Secondary | ICD-10-CM

## 2013-02-12 DIAGNOSIS — F3289 Other specified depressive episodes: Secondary | ICD-10-CM | POA: Diagnosis present

## 2013-02-12 DIAGNOSIS — E440 Moderate protein-calorie malnutrition: Secondary | ICD-10-CM

## 2013-02-12 DIAGNOSIS — M353 Polymyalgia rheumatica: Secondary | ICD-10-CM | POA: Diagnosis present

## 2013-02-12 DIAGNOSIS — R197 Diarrhea, unspecified: Secondary | ICD-10-CM | POA: Diagnosis present

## 2013-02-12 DIAGNOSIS — A498 Other bacterial infections of unspecified site: Secondary | ICD-10-CM | POA: Diagnosis present

## 2013-02-12 DIAGNOSIS — D72810 Lymphocytopenia: Secondary | ICD-10-CM

## 2013-02-12 DIAGNOSIS — E669 Obesity, unspecified: Secondary | ICD-10-CM

## 2013-02-12 DIAGNOSIS — J45909 Unspecified asthma, uncomplicated: Secondary | ICD-10-CM | POA: Diagnosis present

## 2013-02-12 HISTORY — DX: Polymyalgia rheumatica: M35.3

## 2013-02-12 HISTORY — DX: Personal history of other medical treatment: Z92.89

## 2013-02-12 LAB — POCT I-STAT TROPONIN I: Troponin i, poc: 0 ng/mL (ref 0.00–0.08)

## 2013-02-12 LAB — CBC WITH DIFFERENTIAL/PLATELET
Eosinophils Absolute: 0.1 10*3/uL (ref 0.0–0.7)
Lymphocytes Relative: 11 % — ABNORMAL LOW (ref 12–46)
Lymphs Abs: 0.6 10*3/uL — ABNORMAL LOW (ref 0.7–4.0)
Monocytes Relative: 8 % (ref 3–12)
Neutro Abs: 4.3 10*3/uL (ref 1.7–7.7)
Neutrophils Relative %: 80 % — ABNORMAL HIGH (ref 43–77)
Platelets: 197 10*3/uL (ref 150–400)
RBC: 2.28 MIL/uL — ABNORMAL LOW (ref 3.87–5.11)
WBC: 5.3 10*3/uL (ref 4.0–10.5)

## 2013-02-12 LAB — DIFFERENTIAL
Basophils Relative: 0 % (ref 0–1)
Eosinophils Absolute: 0.1 10*3/uL (ref 0.0–0.7)
Monocytes Absolute: 0.5 10*3/uL (ref 0.1–1.0)
Monocytes Relative: 7 % (ref 3–12)

## 2013-02-12 LAB — CBC
HCT: 27.3 % — ABNORMAL LOW (ref 36.0–46.0)
Hemoglobin: 9.1 g/dL — ABNORMAL LOW (ref 12.0–15.0)
MCH: 33.8 pg (ref 26.0–34.0)
MCHC: 33.3 g/dL (ref 30.0–36.0)
Platelets: 221 10*3/uL (ref 150–400)

## 2013-02-12 LAB — BASIC METABOLIC PANEL
BUN: 26 mg/dL — ABNORMAL HIGH (ref 6–23)
Calcium: 9.3 mg/dL (ref 8.4–10.5)
Creatinine, Ser: 1.29 mg/dL — ABNORMAL HIGH (ref 0.50–1.10)
GFR calc non Af Amer: 40 mL/min — ABNORMAL LOW (ref >90)
Glucose, Bld: 129 mg/dL — ABNORMAL HIGH (ref 70–99)
Potassium: 4.3 mEq/L (ref 3.5–5.1)

## 2013-02-12 LAB — POCT I-STAT, CHEM 8
BUN: 25 mg/dL — ABNORMAL HIGH (ref 6–23)
Calcium, Ion: 1.29 mmol/L (ref 1.13–1.30)
Creatinine, Ser: 1.4 mg/dL — ABNORMAL HIGH (ref 0.50–1.10)
Glucose, Bld: 128 mg/dL — ABNORMAL HIGH (ref 70–99)
HCT: 27 % — ABNORMAL LOW (ref 36.0–46.0)
Hemoglobin: 9.2 g/dL — ABNORMAL LOW (ref 12.0–15.0)

## 2013-02-12 LAB — COMPREHENSIVE METABOLIC PANEL
BUN: 24 mg/dL — ABNORMAL HIGH (ref 6–23)
CO2: 24 mEq/L (ref 19–32)
Chloride: 101 mEq/L (ref 96–112)
Creatinine, Ser: 1.12 mg/dL — ABNORMAL HIGH (ref 0.50–1.10)
GFR calc Af Amer: 55 mL/min — ABNORMAL LOW (ref >90)
GFR calc non Af Amer: 48 mL/min — ABNORMAL LOW (ref >90)
Glucose, Bld: 105 mg/dL — ABNORMAL HIGH (ref 70–99)
Total Bilirubin: 2 mg/dL — ABNORMAL HIGH (ref 0.3–1.2)

## 2013-02-12 LAB — URINALYSIS, ROUTINE W REFLEX MICROSCOPIC
Bilirubin Urine: NEGATIVE
Hgb urine dipstick: NEGATIVE
Ketones, ur: NEGATIVE mg/dL
Nitrite: NEGATIVE
Urobilinogen, UA: 0.2 mg/dL (ref 0.0–1.0)
pH: 7 (ref 5.0–8.0)

## 2013-02-12 LAB — URINE MICROSCOPIC-ADD ON

## 2013-02-12 LAB — ABO/RH: ABO/RH(D): O POS

## 2013-02-12 LAB — PREPARE RBC (CROSSMATCH)

## 2013-02-12 LAB — CK: Total CK: 77 U/L (ref 7–177)

## 2013-02-12 LAB — TROPONIN I: Troponin I: <0.3 ng/mL (ref <0.30)

## 2013-02-12 MED ORDER — ONDANSETRON HCL 4 MG PO TABS: 4.0000 mg | ORAL_TABLET | Freq: Four times a day (QID) | ORAL | Status: DC | PRN

## 2013-02-12 MED ORDER — PREDNISONE 20 MG PO TABS
20.0000 mg | ORAL_TABLET | Freq: Every day | ORAL | Status: DC
  Filled 2013-02-12 (×2): qty 1

## 2013-02-12 MED ORDER — ONDANSETRON HCL 4 MG/2ML IJ SOLN: 4.0000 mg | Freq: Four times a day (QID) | INTRAMUSCULAR | Status: DC | PRN

## 2013-02-12 MED ORDER — SODIUM CHLORIDE 0.9 % IV BOLUS (SEPSIS)
1000.0000 mL | Freq: Once | INTRAVENOUS | Status: AC
  Administered 2013-02-12: 1000 mL via INTRAVENOUS

## 2013-02-12 MED ORDER — FAMOTIDINE 20 MG PO TABS
20.0000 mg | ORAL_TABLET | Freq: Two times a day (BID) | ORAL | Status: DC
  Administered 2013-02-12 – 2013-02-15 (×7): 20 mg via ORAL
  Filled 2013-02-12 (×8): qty 1

## 2013-02-12 MED ORDER — CALCIUM CARBONATE-VITAMIN D 500-200 MG-UNIT PO TABS
1.0000 | ORAL_TABLET | Freq: Two times a day (BID) | ORAL | Status: DC
  Administered 2013-02-12 – 2013-02-15 (×7): 1 via ORAL
  Filled 2013-02-12 (×9): qty 1

## 2013-02-12 MED ORDER — ACETAMINOPHEN 650 MG RE SUPP: 650.0000 mg | Freq: Four times a day (QID) | RECTAL | Status: DC | PRN

## 2013-02-12 MED ORDER — ALBUTEROL SULFATE (5 MG/ML) 0.5% IN NEBU: 2.5000 mg | INHALATION_SOLUTION | RESPIRATORY_TRACT | Status: DC | PRN

## 2013-02-12 MED ORDER — BOOST / RESOURCE BREEZE PO LIQD
1.0000 | Freq: Two times a day (BID) | ORAL | Status: DC
  Administered 2013-02-12 – 2013-02-15 (×4): 1 via ORAL

## 2013-02-12 MED ORDER — DEXTROSE 5 % IV SOLN
1.0000 g | Freq: Once | INTRAVENOUS | Status: AC
  Administered 2013-02-12: 1 g via INTRAVENOUS
  Filled 2013-02-12: qty 10

## 2013-02-12 MED ORDER — SODIUM CHLORIDE 0.9 % IV SOLN
250.0000 mg | Freq: Three times a day (TID) | INTRAVENOUS | Status: DC
  Administered 2013-02-12 – 2013-02-13 (×4): 250 mg via INTRAVENOUS
  Filled 2013-02-12 (×6): qty 250

## 2013-02-12 MED ORDER — ACETAMINOPHEN 325 MG PO TABS
650.0000 mg | ORAL_TABLET | Freq: Four times a day (QID) | ORAL | Status: DC | PRN
  Administered 2013-02-13: 650 mg via ORAL
  Filled 2013-02-12: qty 2

## 2013-02-12 MED ORDER — FERROUS SULFATE 325 (65 FE) MG PO TABS
325.0000 mg | ORAL_TABLET | Freq: Every day | ORAL | Status: DC
  Administered 2013-02-12 – 2013-02-15 (×4): 325 mg via ORAL
  Filled 2013-02-12 (×4): qty 1

## 2013-02-12 MED ORDER — SODIUM CHLORIDE 0.9 % IV SOLN
INTRAVENOUS | Status: AC
  Administered 2013-02-12: 08:00:00 via INTRAVENOUS

## 2013-02-12 MED ORDER — PREDNISONE 20 MG PO TABS
20.0000 mg | ORAL_TABLET | Freq: Every day | ORAL | Status: DC
  Administered 2013-02-13 – 2013-02-15 (×3): 20 mg via ORAL
  Filled 2013-02-12 (×4): qty 1

## 2013-02-12 MED ORDER — HYDROCORTISONE SOD SUCCINATE 100 MG IJ SOLR
50.0000 mg | Freq: Once | INTRAMUSCULAR | Status: AC
  Administered 2013-02-12: 10:00:00 via INTRAVENOUS
  Filled 2013-02-12: qty 1

## 2013-02-12 MED ORDER — ASPIRIN EC 81 MG PO TBEC
81.0000 mg | DELAYED_RELEASE_TABLET | Freq: Every day | ORAL | Status: DC
  Filled 2013-02-12: qty 1

## 2013-02-12 MED ORDER — SODIUM CHLORIDE 0.9 % IJ SOLN
3.0000 mL | Freq: Two times a day (BID) | INTRAMUSCULAR | Status: DC
  Administered 2013-02-12 – 2013-02-14 (×3): 3 mL via INTRAVENOUS

## 2013-02-12 MED ORDER — ONDANSETRON HCL 4 MG/2ML IJ SOLN
4.0000 mg | Freq: Once | INTRAMUSCULAR | Status: AC
  Administered 2013-02-12: 4 mg via INTRAVENOUS
  Filled 2013-02-12: qty 2

## 2013-02-12 MED ORDER — HYDRALAZINE HCL 20 MG/ML IJ SOLN: 10.0000 mg | INTRAMUSCULAR | Status: DC | PRN

## 2013-02-12 NOTE — Progress Notes (Signed)
72yo female c/o syncopal episode after "feeling sick in the stomach" all day and diarrhea x2, probable dehydration and UTI given strong smell to urine, had been admitted last month for pyelonephritis that resulted as Ecoli resistant to cephalasporins, PCNs, Bactrim. Will start Primaxin 250mg  IV Q8H for wt 67kg and CrCl ~30 ml/min and monitor CBC, Cx, clinical progression.  Vernard Gambles, PharmD, BCPS 02/12/2013 7:00 AM

## 2013-02-12 NOTE — ED Notes (Signed)
Patient stated she was on the toilet and had a syncopal episode.  Stated that she has been laying in the bed for the past day or 2 not feeling well.

## 2013-02-12 NOTE — ED Provider Notes (Signed)
CSN: [id number]     Arrival date & time 02/12/13  0147 History   First MD Initiated Contact with Patient 02/12/13 0211     Chief Complaint  Patient presents with  . Near Syncope   (Consider location/radiation/quality/duration/timing/severity/associated sxs/prior Treatment) HPI 72 year old female presents to emergency room from home via EMS after syncopal event.  Patient reports that she was going to the bathroom.  Husband found her on the floor unconscious.  She is complaining of low back pain.  Patient has not felt well for the last 2 days, she reports nausea without vomiting, and several episodes of diarrhea.  Patient reports his having surgery for hiatal hernia.  Last year, she has had poor appetite, and has felt ill, whenever eating greasy or meat products.  Husband reports she mainly eats toast.  He reports she is able to eat a full dinner at [employer], however.  No fevers or chills.  No sick contacts.  No change in her diet. Past Medical History  Diagnosis Date  . Leukocytopenia   . HTN (hypertension)   . Asthma   . IBS (irritable bowel syndrome)   . Anxiety   . Depression   . Chronic kidney disease (CKD), stage III (moderate)   . Hypercalcemia   . Fatigue   . Lymphopenia 08/23/2011  . COPD (chronic obstructive pulmonary disease)   . Renal insufficiency   . UTI (urinary tract infection) 12/26/2011  . Syncope and collapse 12/24/2011    "loss of consciousness for 3-4 min" (12/26/2011)  . Hypercholesteremia     "above borderline; I don't take RX for it" (12/26/2011)  . H/O hiatal hernia   . GERD (gastroesophageal reflux disease)   . Arthritis     "knees and back" (12/26/2011)  . Osteoporosis   . 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 Iron deficiency anemia     [facility]   Past Surgical History  Procedure Laterality Date  . Hernia repair  1970's    "stomach" (12/26/2011)  . Abdominal hysterectomy  ~ 1980  .  Esophagogastroduodenoscopy  12/26/2011    Procedure: ESOPHAGOGASTRODUODENOSCOPY (EGD);  Surgeon: Louis Meckel, MD;  Location: [facility] ENDOSCOPY;  Service: Endoscopy;  Laterality: N/A;  . Breast surgery  04-06-12    rt. lumpectomy_benign  . Tubal ligation    . Laparoscopic nissen fundoplication N/A 04/08/2012    Procedure: Laparoscopic Reduction and Repair of Paraesophagel Hiatal Hernia with Nissen;  Surgeon: Ardeth Sportsman, MD;  Location: [facility] ORS;  Service: General;  Laterality: N/A;  Laparoscopic Reduction and Repair of Paraesophagel Hiatal Hernia with Nissen   Family History  Problem Relation Age of Onset  . Diabetes Father   . Heart disease Father   . Cancer Maternal Aunt     Breast cancer   History  Substance Use Topics  . Smoking status: Former Smoker -- 1.00 packs/day for 40 years    Types: Cigarettes    Quit date: 02/19/1999  . Smokeless tobacco: Never Used  . Alcohol Use: No   OB History   Grav Para Term Preterm Abortions TAB SAB Ect Mult Living                 Review of Systems  See History of Present Illness; otherwise all other systems are reviewed and negative Allergies  Penicillins; Brovana; and Paxil  Home Medications   Current Outpatient Rx  Name  Route  Sig  Dispense  Refill  .  albuterol (PROVENTIL) (2.5 MG/3ML) 0.083% nebulizer solution   Nebulization   Take 2.5 mg by nebulization every 6 (six) hours as needed for wheezing or shortness of breath. Wheezing and shortness of breath         . aspirin EC 81 MG tablet   Oral   Take 81 mg by mouth daily.         . beta carotene w/minerals (OCUVITE) tablet   Oral   Take 1 tablet by mouth daily.         . calcium-vitamin D (OSCAL WITH D) 500-200 MG-UNIT per tablet   Oral   Take 1 tablet by mouth 2 (two) times daily.         . Ferrous Sulfate (IRON) 28 MG TABS   Oral   Take 1 tablet by mouth daily.         [name] lisinopril-hydrochlorothiazide (PRINZIDE,ZESTORETIC) 20-12.5 MG per tablet   Oral   Take  1 tablet by mouth daily.         . Multiple Vitamin (MULTIVITAMIN) tablet   Oral   Take 1 tablet by mouth daily.         . predniSONE (DELTASONE) 20 MG tablet   Oral   Take 20 mg by mouth daily with breakfast. Dose pack         . ranitidine (ZANTAC) 150 MG tablet   Oral   Take 150 mg by mouth daily.          BP 128/69  Pulse 87  Temp(Src) 98.6 F (37 C) (Oral)  Resp 19  Ht 5\' 3"  (1.6 m)  Wt 150 lb (68.04 kg)  BMI 26.58 kg/m2  SpO2 100% Physical Exam  Nursing note and vitals reviewed. Constitutional: She is oriented to person, place, and time. She appears well-developed and well-nourished. She appears distressed.  HENT:  Head: Normocephalic and atraumatic.  Nose: Nose normal.  Dry mucous membranes  Eyes: Conjunctivae and EOM are normal. Pupils are equal, round, and reactive to light.  Neck: Normal range of motion. Neck supple. No JVD present. No tracheal deviation present. No thyromegaly present.  Cardiovascular: Normal rate, regular rhythm, normal heart sounds and intact distal pulses.  Exam reveals no gallop and no friction rub.   No murmur heard. Pulmonary/Chest: Effort normal and breath sounds normal. No stridor. No respiratory distress. She has no wheezes. She has no rales. She exhibits no tenderness.  Abdominal: Soft. Bowel sounds are normal. She exhibits no distension and no mass. There is tenderness (tenderness of palpation to the suprapubic region). There is no rebound and no guarding.  Musculoskeletal: Normal range of motion. She exhibits tenderness (patient with tenderness to the left paraspinal muscles at lower lumbar region.  She has no step-off or crepitus to midline.). She exhibits no edema.  Lymphadenopathy:    She has no cervical adenopathy.  Neurological: She is alert and oriented to person, place, and time. She exhibits normal muscle tone. Coordination normal.  Skin: Skin is warm and dry. No rash noted. No erythema. There is pallor.  Psychiatric:  She has a normal mood and affect. Her behavior is normal. Judgment and thought content normal.    ED Course  Procedures (including critical care time) Labs Review Labs Reviewed  CBC - Abnormal; Notable for the following:    RBC 2.69 (*)    Hemoglobin 9.1 (*)    HCT 27.3 (*)    MCV 101.5 (*)    All other components within normal limits  BASIC  METABOLIC PANEL - Abnormal; Notable for the following:    Sodium 134 (*)    Glucose, Bld 129 (*)    BUN 26 (*)    Creatinine, Ser 1.29 (*)    GFR calc non Af Amer 40 (*)    GFR calc Af Amer 46 (*)    All other components within normal limits  GLUCOSE, CAPILLARY - Abnormal; Notable for the following:    Glucose-Capillary 106 (*)    All other components within normal limits  DIFFERENTIAL - Abnormal; Notable for the following:    Neutrophils Relative % 84 (*)    Lymphocytes Relative 8 (*)    Lymphs Abs 0.6 (*)    All other components within normal limits  POCT I-STAT, CHEM 8 - Abnormal; Notable for the following:    BUN 25 (*)    Creatinine, Ser 1.40 (*)    Glucose, Bld 128 (*)    Hemoglobin 9.2 (*)    HCT 27.0 (*)    All other components within normal limits  URINALYSIS, ROUTINE W REFLEX MICROSCOPIC  POCT I-STAT TROPONIN I   Imaging Review Dg Chest 2 View  02/12/2013   CLINICAL DATA:  Chest pain; syncope.  Shortness of breath.  EXAM: CHEST  2 VIEW  COMPARISON:  Chest radiograph performed 01/08/2013  FINDINGS: The lungs are well-aerated. Pulmonary vascularity is at the upper limits of normal. There is no evidence of focal opacification, pleural effusion or pneumothorax.  The heart is normal in size; the mediastinal contour is within normal limits. No acute osseous abnormalities are seen.  IMPRESSION: No acute cardiopulmonary process seen.   Electronically Signed   By: Roanna Raider [name]   On: 02/12/2013 02:46   Dg Lumbar Spine 2-3 Views  02/12/2013   CLINICAL DATA:  Status post fall; lower back pain.  EXAM: LUMBAR SPINE - 2-3 VIEW   COMPARISON:  CT of the abdomen and pelvis performed 01/03/2013  FINDINGS: There is no evidence of fracture or subluxation. Vertebral bodies demonstrate normal height and alignment. Intervertebral disc spaces are preserved. The visualized neural foramina are grossly unremarkable in appearance.  The visualized bowel gas pattern is unremarkable in appearance; air and stool are noted within the colon. The sacroiliac joints are within normal limits. Diffuse calcification is noted along the abdominal aorta.  IMPRESSION: No evidence of fracture or subluxation along the lumbar spine.   Electronically Signed   By: Roanna Raider [name]   On: 02/12/2013 02:45   Ct Head Wo Contrast  02/12/2013   CLINICAL DATA:  Syncope.  EXAM: CT HEAD WITHOUT CONTRAST  TECHNIQUE: Contiguous axial images were obtained from the base of the skull through the vertex without intravenous contrast.  COMPARISON:  CT of the head performed 07/01/2003  FINDINGS: There is no evidence of acute infarction, mass lesion, or intra- or extra-axial hemorrhage on CT.  Prominence of the ventricles and sulci suggests mild cortical volume loss. Mild periventricular white matter change likely reflects small vessel ischemic microangiopathy.  The brainstem and fourth ventricle are within normal limits. The basal ganglia are unremarkable in appearance. The cerebral hemispheres demonstrate grossly normal gray-white differentiation. No mass effect or midline shift is seen.  There is no evidence of fracture; visualized osseous structures are unremarkable in appearance. The visualized portions of the orbits are within normal limits. The paranasal sinuses and mastoid air cells are well-aerated. No significant soft tissue abnormalities are seen.  IMPRESSION: 1. No acute intracranial pathology seen on CT. 2. Mild cortical volume loss  and minimal small vessel ischemic microangiopathy.   Electronically Signed   By: Roanna Raider [name]   On: 02/12/2013 02:31    EKG  Interpretation    Date/Time:  Friday February 12 2013 02:01:14 EST Ventricular Rate:  83 PR Interval:  164 QRS Duration: 75 QT Interval:  326 QTC Calculation: 383 R Axis:   17 Text Interpretation:  Sinus rhythm Low voltage, extremity leads Since last tracing rate slower Confirmed by Latiana Tomei  MD, Jeilyn Reznik ([id number]) on 02/12/2013 2:24:17 AM            MDM   1. Syncope   2. Orthostatic lightheadedness   3. Urinary tract infection    72 year old female with syncope.  It sounds as though this was orthostatic in nature.  Patient noted be tachycardic when she is standing.  Suspect dehydration.  Given poor by mouth intake over the last few days.  Nursing staff reports urine is very strong smelling.  Plan for IV fluid bolus, Zofran for nausea, and by mouth fluids, as well.  Recognition given syncope, and a 72 year old female with probable UTI.  Patient had admission last month for pyelonephritis requiring IV antibiotics and PICC line   Olivia Mackie, MD 02/12/13 [phone]

## 2013-02-12 NOTE — Progress Notes (Signed)
Utilization review completed.  

## 2013-02-12 NOTE — H&P (Signed)
[facility] History and Physical  Nicole Meyer ZOX:[id number] DOB: 1940-08-22 DOA: 02/12/2013  Referring physician: ER physician. PCP: Thora Lance, MD   Chief Complaint: Loss of consciousness.  HPI: Nicole Meyer is a 72 y.o. female history of chronic kidney disease, hypertension, polymyalgia rheumatica who was admitted last month with Escherichia coli ESBL bacteremia and was on ertapenem IV for 2 weeks and of course was over the last week presented to the year because patient had a syncopal episode. As per patient she felt uncomfortable and abdomen and went to urinate. Patient's husband heard a sound and went to the bathroom to see patient was unconscious. She was unconscious for a few minutes and regained consciousness spontaneously. She did not have any tongue bite or incontinence of urine or seizure-like activities. Patient denies any chest pain or shortness of breath. Patient has been not feeling well since yesterday morning. She had 3 episodes of diarrhea day before yesterday. She's been feeling weak and tired. Last week since he had some lower extremity pain her primary care physician had restarted her on prednisone for polymyalgia rheumatica. Yesterday patient was lying on the bed the whole day. Denies any nausea vomiting fever chills or any focal deficits. In the ER patient was found to be orthostatic and was given 1 L normal saline bolus. Patient's UA shows features of UTI. Since patient has been having lower quadrant abdominal pain CT abdomen and pelvis is pending.   Review of Systems: As presented in the history of presenting illness, rest negative.  Past Medical History  Diagnosis Date  . Leukocytopenia   . HTN (hypertension)   . Asthma   . IBS (irritable bowel syndrome)   . Anxiety   . Depression   . Chronic kidney disease (CKD), stage III (moderate)   . Hypercalcemia   . Fatigue   . Lymphopenia 08/23/2011  . COPD (chronic obstructive pulmonary disease)   .  Renal insufficiency   . UTI (urinary tract infection) 12/26/2011  . Syncope and collapse 12/24/2011    "loss of consciousness for 3-4 min" (12/26/2011)  . Hypercholesteremia     "above borderline; I don't take RX for it" (12/26/2011)  . H/O hiatal hernia   . GERD (gastroesophageal reflux disease)   . Arthritis     "knees and back" (12/26/2011)  . Osteoporosis   . 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 Iron deficiency anemia     [facility]   Past Surgical History  Procedure Laterality Date  . Hernia repair  1970's    "stomach" (12/26/2011)  . Abdominal hysterectomy  ~ 1980  . Esophagogastroduodenoscopy  12/26/2011    Procedure: ESOPHAGOGASTRODUODENOSCOPY (EGD);  Surgeon: Louis Meckel, MD;  Location: [facility] ENDOSCOPY;  Service: Endoscopy;  Laterality: N/A;  . Breast surgery  04-06-12    rt. lumpectomy_benign  . Tubal ligation    . Laparoscopic nissen fundoplication N/A 04/08/2012    Procedure: Laparoscopic Reduction and Repair of Paraesophagel Hiatal Hernia with Nissen;  Surgeon: Ardeth Sportsman, MD;  Location: [facility] ORS;  Service: General;  Laterality: N/A;  Laparoscopic Reduction and Repair of Paraesophagel Hiatal Hernia with Nissen   Social History:  reports that she quit smoking about 13 years ago. Her smoking use included Cigarettes. She has a 40 pack-year smoking history. She has never used smokeless tobacco. She reports that she does not drink alcohol or use illicit drugs. Where does patient live home. Can patient participate  in ADLs? Yes.  Allergies  Allergen Reactions  . Penicillins Hives  . Brovana [Arformoterol] Other (See Comments)    "makes me nervous"  . Paxil [Paroxetine Hcl] Rash    Family History:  Family History  Problem Relation Age of Onset  . Diabetes Father   . Heart disease Father   . Cancer Maternal Aunt     Breast cancer      Prior to Admission medications   Medication Sig Start Date End Date Taking?  Authorizing Provider  albuterol (PROVENTIL) (2.5 MG/3ML) 0.083% nebulizer solution Take 2.5 mg by nebulization every 6 (six) hours as needed for wheezing or shortness of breath. Wheezing and shortness of breath   Yes Historical Provider, MD  aspirin EC 81 MG tablet Take 81 mg by mouth daily.   Yes Historical Provider, MD  beta carotene w/minerals (OCUVITE) tablet Take 1 tablet by mouth daily.   Yes Historical Provider, MD  calcium-vitamin D (OSCAL WITH D) 500-200 MG-UNIT per tablet Take 1 tablet by mouth 2 (two) times daily.   Yes Historical Provider, MD  Ferrous Sulfate (IRON) 28 MG TABS Take 1 tablet by mouth daily.   Yes Historical Provider, MD  lisinopril-hydrochlorothiazide (PRINZIDE,ZESTORETIC) 20-12.5 MG per tablet Take 1 tablet by mouth daily.   Yes Historical Provider, MD  Multiple Vitamin (MULTIVITAMIN) tablet Take 1 tablet by mouth daily.   Yes Historical Provider, MD  predniSONE (DELTASONE) 20 MG tablet Take 20 mg by mouth daily with breakfast. Dose pack 02/05/13 02/19/13 Yes Historical Provider, MD  ranitidine (ZANTAC) 150 MG tablet Take 150 mg by mouth daily.   Yes Historical Provider, MD    Physical Exam: Filed Vitals:   02/12/13 0530 02/12/13 0545 02/12/13 0600 02/12/13 0615  BP: 116/68 110/67 117/57 123/67  Pulse: 80 80 78 78  Temp:      TempSrc:      Resp: 22 23 22 16   Height:      Weight:      SpO2: 100% 100% 100% 100%     General:  Well-developed and nourished.  Eyes: Anicteric no pallor.  ENT: No discharge from ears eyes nose mouth.  Neck: No mass felt.  Cardiovascular: S1-S2 heard.  Respiratory: No rhonchi or crepitations.  Abdomen: Soft nontender bowel sounds present.  Skin: No rash.  Musculoskeletal: No edema.  Psychiatric: Appears normal.  Neurologic: Alert awake oriented to time place and person. Moves all extremities.  Labs on Admission:  Basic Metabolic Panel:  Recent Labs Lab 02/12/13 0209 02/12/13 0225  NA 134* 135  K 4.3 4.3  CL 98  100  CO2 25  --   GLUCOSE 129* 128*  BUN 26* 25*  CREATININE 1.29* 1.40*  CALCIUM 9.3  --    Liver Function Tests: No results found for this basename: AST, ALT, ALKPHOS, BILITOT, PROT, ALBUMIN,  in the last 168 hours No results found for this basename: LIPASE, AMYLASE,  in the last 168 hours No results found for this basename: AMMONIA,  in the last 168 hours CBC:  Recent Labs Lab 02/12/13 0209 02/12/13 0224 02/12/13 0225  WBC 7.4  --   --   NEUTROABS  --  6.2  --   HGB 9.1*  --  9.2*  HCT 27.3*  --  27.0*  MCV 101.5*  --   --   PLT 221  --   --    Cardiac Enzymes: No results found for this basename: CKTOTAL, CKMB, CKMBINDEX, TROPONINI,  in the last 168 hours  BNP (last 3 results) No results found for this basename: PROBNP,  in the last 8760 hours CBG:  Recent Labs Lab 02/12/13 0211  GLUCAP 106*    Radiological Exams on Admission: Dg Chest 2 View  02/12/2013   CLINICAL DATA:  Chest pain; syncope.  Shortness of breath.  EXAM: CHEST  2 VIEW  COMPARISON:  Chest radiograph performed 01/08/2013  FINDINGS: The lungs are well-aerated. Pulmonary vascularity is at the upper limits of normal. There is no evidence of focal opacification, pleural effusion or pneumothorax.  The heart is normal in size; the mediastinal contour is within normal limits. No acute osseous abnormalities are seen.  IMPRESSION: No acute cardiopulmonary process seen.   Electronically Signed   By: Roanna Raider [name]   On: 02/12/2013 02:46   Dg Lumbar Spine 2-3 Views  02/12/2013   CLINICAL DATA:  Status post fall; lower back pain.  EXAM: LUMBAR SPINE - 2-3 VIEW  COMPARISON:  CT of the abdomen and pelvis performed 01/03/2013  FINDINGS: There is no evidence of fracture or subluxation. Vertebral bodies demonstrate normal height and alignment. Intervertebral disc spaces are preserved. The visualized neural foramina are grossly unremarkable in appearance.  The visualized bowel gas pattern is unremarkable in  appearance; air and stool are noted within the colon. The sacroiliac joints are within normal limits. Diffuse calcification is noted along the abdominal aorta.  IMPRESSION: No evidence of fracture or subluxation along the lumbar spine.   Electronically Signed   By: Roanna Raider [name]   On: 02/12/2013 02:45   Ct Head Wo Contrast  02/12/2013   CLINICAL DATA:  Syncope.  EXAM: CT HEAD WITHOUT CONTRAST  TECHNIQUE: Contiguous axial images were obtained from the base of the skull through the vertex without intravenous contrast.  COMPARISON:  CT of the head performed 07/01/2003  FINDINGS: There is no evidence of acute infarction, mass lesion, or intra- or extra-axial hemorrhage on CT.  Prominence of the ventricles and sulci suggests mild cortical volume loss. Mild periventricular white matter change likely reflects small vessel ischemic microangiopathy.  The brainstem and fourth ventricle are within normal limits. The basal ganglia are unremarkable in appearance. The cerebral hemispheres demonstrate grossly normal gray-white differentiation. No mass effect or midline shift is seen.  There is no evidence of fracture; visualized osseous structures are unremarkable in appearance. The visualized portions of the orbits are within normal limits. The paranasal sinuses and mastoid air cells are well-aerated. No significant soft tissue abnormalities are seen.  IMPRESSION: 1. No acute intracranial pathology seen on CT. 2. Mild cortical volume loss and minimal small vessel ischemic microangiopathy.   Electronically Signed   By: Roanna Raider [name]   On: 02/12/2013 02:31    EKG: Independently reviewed. Normal sinus rhythm.  Assessment/Plan Principal Problem:   Syncope Active Problems:   Anemia   Chronic kidney disease (CKD), stage III (moderate)   Pyelonephritis   1. Syncope - most likely secondary to dehydration from recent diarrhea and poor intake. Patient was orthostatic in the ER. Patient did receive 1 L normal  saline bolus I will continue with normal saline at 100 cc per hour for another 24 hours and closely monitor intake output and metabolic panel since patient has chronic kidney disease. Hold antihypertensives for now. Since patient was recently started on prednisone for polymyalgia rheumatica I have given one dose of stress dose steroids. Closely monitor blood pressure trends. 2. Abdominal pain suspect pyelonephritis versus colitis - CT abdomen and pelvis has been ordered  and is pending. Patient has been started on empiric antibiotics for pyelonephritis since patient has had recent ESBL I have placed patient on Primaxin. Check blood cultures and urine cultures. Check stool studies. 3. Possible pyelonephritis - see #2. 4. Diarrhea - see #2. 5. History of polymyalgia rheumatica - patient has been recently restarted on steroids for polymyalgia rheumatica week ago. I have placed patient on one dose of IV hydrocortisone 50 mg for stress dose. Closely monitor blood pressure trends. 6. History of hypertension - holding off antihypertensives for now due to her orthostatic changes. I have placed patient on when necessary IV hydralazine for systolic blood pressure more than 160. 7. Chronic kidney disease - creatinine appears to be at baseline. 8. Anemia - probably from kidney disease. Hemoglobin appears to be at baseline. Follow CBC. Patient states she had a colonoscopy 5 years ago which was unremarkable.    Code Status: Full code.  Family Communication: Patient's husband at the bedside.  Disposition Plan: Admit to inpatient.    Audryana Hockenberry N. [facility] Pager [phone].  If 7PM-7AM, please contact night-coverage [email or web address] Password [facility] 02/12/2013, 6:32 AM

## 2013-02-12 NOTE — ED Notes (Signed)
To Ct then to [id number]

## 2013-02-12 NOTE — Progress Notes (Signed)
[facility] PROGRESS NOTE  Nicole Meyer:[id number] DOB: 01-May-1940 DOA: 02/12/2013 PCP: Thora Lance, MD  Assessment/Plan: 1-Syncope - most likely secondary to dehydration from recent diarrhea and poor intake. Patient was orthostatic in the ER. Patient did receive 1 L normal saline bolus in the ED.  Continue with IV fluids.  Repeat orthostatic in am.   2-Abdominal pain suspect pyelonephritis; resolved. CT negative for colitis.   3-Possible pyelonephritis; follow urine culture. Continue with Primaxin.   4-Diarrhea - C diff ordered. No BM today.  5-History of polymyalgia rheumatica - patient has been recently restarted on steroids for polymyalgia rheumatica week ago. Received one dose  IV hydrocortisone 50 mg for stress dose. Continue with prednisone.   6-History of hypertension - holding off antihypertensives for now due to her orthostatic changes. PRN Hydralazine.   7-Chronic kidney disease - cr range 1.0 to 1.2. Continue with IV fluids.  8-Anemia - prior history of iron deficiency anemia, component anemia of chronic diseases. Hb decrease to 7.7. No evidence of active bleeding . Patient feel more weak today. Will transfuse one unit of PRBC. She follow with cancer center.   Code Status: Full Code.  Family Communication: care discussed with patient.  Disposition Plan: Remain inpatient.    Consultants:  none  Procedures:  none  Antibiotics:  Primaxin 12-26  HPI/Subjective: No BM today, no melena.  Denies abdominal pain.  Feels more weak today.   Objective: Filed Vitals:   02/12/13 1020  BP: 128/76  Pulse:   Temp:   Resp:     Intake/Output Summary (Last 24 hours) at 02/12/13 1056 Last data filed at 02/12/13 0515  Gross per 24 hour  Intake   1240 ml  Output      0 ml  Net   1240 ml   Filed Weights   02/12/13 0155 02/12/13 0649  Weight: 68.04 kg (150 lb) 67.314 kg (148 lb 6.4 oz)    Exam:   General: no distress.   Cardiovascular: S 1,  S 2 RRR  Respiratory: CTA  Abdomen: BS present, soft, nt  Musculoskeletal: no edema  Data Reviewed: Basic Metabolic Panel:  Recent Labs Lab 02/12/13 0209 02/12/13 0225 02/12/13 0822  NA 134* 135 135  K 4.3 4.3 4.3  CL 98 100 101  CO2 25  --  24  GLUCOSE 129* 128* 105*  BUN 26* 25* 24*  CREATININE 1.29* 1.40* 1.12*  CALCIUM 9.3  --  8.6   Liver Function Tests:  Recent Labs Lab 02/12/13 0822  AST 12  ALT 9  ALKPHOS 65  BILITOT 2.0*  PROT 6.3  ALBUMIN 2.7*   No results found for this basename: LIPASE, AMYLASE,  in the last 168 hours No results found for this basename: AMMONIA,  in the last 168 hours CBC:  Recent Labs Lab 02/12/13 0209 02/12/13 0224 02/12/13 0225 02/12/13 0822  WBC 7.4  --   --  5.3  NEUTROABS  --  6.2  --  4.3  HGB 9.1*  --  9.2* 7.7*  HCT 27.3*  --  27.0* 23.2*  MCV 101.5*  --   --  101.8*  PLT 221  --   --  197   Cardiac Enzymes:  Recent Labs Lab 02/12/13 0822  CKTOTAL 77  TROPONINI <0.30   BNP (last 3 results) No results found for this basename: PROBNP,  in the last 8760 hours CBG:  Recent Labs Lab 02/12/13 0211  GLUCAP 106*    No results found for  this or any previous visit (from the past 240 hour(s)).   Studies: Ct Abdomen Pelvis Wo Contrast  02/12/2013   CLINICAL DATA:  Syncope. Lower back pain and nausea. Bacteria on urinalysis. Assess for hydronephrosis.  EXAM: CT ABDOMEN AND PELVIS WITHOUT CONTRAST  TECHNIQUE: Multidetector CT imaging of the abdomen and pelvis was performed following the standard protocol without intravenous contrast.  COMPARISON:  CT of the abdomen and pelvis performed 12/28/2012, and abdominal ultrasound performed 01/06/2013  FINDINGS: The visualized lung bases are clear. Scattered coronary artery calcifications are seen. Postoperative change is noted about the gastroesophageal junction.  Apparent scarring is noted within the left hepatic lobe. A few calcified granulomata are seen within the liver.  The spleen is unremarkable in appearance. The gallbladder is within normal limits. The pancreas and adrenal glands are unremarkable.  There is no evidence of hydronephrosis. Mild nonspecific perinephric stranding is noted bilaterally. There is slight prominence of the proximal left ureter, but this likely remains within normal limits. No renal or ureteral stones are seen.  No free fluid is identified. The small bowel is unremarkable in appearance. The stomach is within normal limits. No acute vascular abnormalities are seen. Scattered calcification is noted along the abdominal aorta and its branches.  The appendix is not definitely seen; there is no evidence for appendicitis. The colon is largely decompressed. Mild diverticulosis is noted along the sigmoid colon, without evidence of diverticulitis.  The bladder is mildly distended and grossly unremarkable. The patient is status post hysterectomy. No suspicious adnexal masses are seen. The left ovary is unremarkable in appearance. No inguinal lymphadenopathy is seen.  No acute osseous abnormalities are identified.  IMPRESSION: 1. No evidence of hydronephrosis.  No renal or ureteral stones seen. 2. Scattered calcification along the abdominal aorta and its branches. 3. Scattered coronary artery calcifications seen. 4. Scarring within the left hepatic lobe. 5. Mild diverticulosis along the sigmoid colon, without evidence of diverticulitis.   Electronically Signed   By: Roanna Raider [name]   On: 02/12/2013 06:52   Dg Chest 2 View  02/12/2013   CLINICAL DATA:  Chest pain; syncope.  Shortness of breath.  EXAM: CHEST  2 VIEW  COMPARISON:  Chest radiograph performed 01/08/2013  FINDINGS: The lungs are well-aerated. Pulmonary vascularity is at the upper limits of normal. There is no evidence of focal opacification, pleural effusion or pneumothorax.  The heart is normal in size; the mediastinal contour is within normal limits. No acute osseous abnormalities are seen.   IMPRESSION: No acute cardiopulmonary process seen.   Electronically Signed   By: Roanna Raider [name]   On: 02/12/2013 02:46   Dg Lumbar Spine 2-3 Views  02/12/2013   CLINICAL DATA:  Status post fall; lower back pain.  EXAM: LUMBAR SPINE - 2-3 VIEW  COMPARISON:  CT of the abdomen and pelvis performed 01/03/2013  FINDINGS: There is no evidence of fracture or subluxation. Vertebral bodies demonstrate normal height and alignment. Intervertebral disc spaces are preserved. The visualized neural foramina are grossly unremarkable in appearance.  The visualized bowel gas pattern is unremarkable in appearance; air and stool are noted within the colon. The sacroiliac joints are within normal limits. Diffuse calcification is noted along the abdominal aorta.  IMPRESSION: No evidence of fracture or subluxation along the lumbar spine.   Electronically Signed   By: Roanna Raider [name]   On: 02/12/2013 02:45   Ct Head Wo Contrast  02/12/2013   CLINICAL DATA:  Syncope.  EXAM: CT HEAD WITHOUT  CONTRAST  TECHNIQUE: Contiguous axial images were obtained from the base of the skull through the vertex without intravenous contrast.  COMPARISON:  CT of the head performed 07/01/2003  FINDINGS: There is no evidence of acute infarction, mass lesion, or intra- or extra-axial hemorrhage on CT.  Prominence of the ventricles and sulci suggests mild cortical volume loss. Mild periventricular white matter change likely reflects small vessel ischemic microangiopathy.  The brainstem and fourth ventricle are within normal limits. The basal ganglia are unremarkable in appearance. The cerebral hemispheres demonstrate grossly normal gray-white differentiation. No mass effect or midline shift is seen.  There is no evidence of fracture; visualized osseous structures are unremarkable in appearance. The visualized portions of the orbits are within normal limits. The paranasal sinuses and mastoid air cells are well-aerated. No significant soft tissue  abnormalities are seen.  IMPRESSION: 1. No acute intracranial pathology seen on CT. 2. Mild cortical volume loss and minimal small vessel ischemic microangiopathy.   Electronically Signed   By: Roanna Raider [name]   On: 02/12/2013 02:31    Scheduled Meds: . aspirin EC  81 mg Oral Daily  . calcium-vitamin D  1 tablet Oral BID WC  . famotidine  20 mg Oral BID  . ferrous sulfate  325 mg Oral Daily  . imipenem-cilastatin  250 mg Intravenous Q8H  . [START ON 02/13/2013] predniSONE  20 mg Oral Q breakfast  . sodium chloride  3 mL Intravenous Q12H   Continuous Infusions: . sodium chloride 100 mL/hr at 02/12/13 8119    Principal Problem:   Syncope Active Problems:   Anemia   Chronic kidney disease (CKD), stage III (moderate)   Pyelonephritis    Time spent: 35 minutes.     Elia Keenum  [facility] Pager [phone]. If 7PM-7AM, please contact night-coverage at [email or web address], password [facility] 02/12/2013, 10:56 AM  LOS: 0 days

## 2013-02-12 NOTE — Progress Notes (Signed)
INITIAL NUTRITION ASSESSMENT  DOCUMENTATION CODES Per approved criteria  -Not Applicable   INTERVENTION: Recommend Resource Breeze BID w/meals Recommend to advance to regular diet as tolerated to encourage PO intake. Consider renal diet 60/70 if phos/k become elevated  NUTRITION DIAGNOSIS: Inadequate oral intake related to n/v/abd pain as evidenced by PO intake <75% est nutritional needs, unintentional wt loss  Goal: Pt to meet >/= 90% of their estimated nutrition needs    Monitor:  Diet advancement, total protein/energy intake, labs, weights, GI profile  Reason for Assessment: Malnutrition Screening Tool - 6  72 y.o. female  Admitting Dx: Syncope  ASSESSMENT: -Pt reported an unintentional wt loss of 20 lbs over one year period after undergoing hernia repair. Has difficulty tolerating certain foods (salty meats, beans, lettuce), this is likely r/t to pt's hx of IBS. Is familiar with what foods cause GI distress and is able to avoid these foods, declined any diet education. Will recommend regular diet as diet advancement tolerated to encourage PO intake -Pt evaluated by RD during previous admit. MVI and Ensure Complete had been recommended; however, pt denied consumption or use of any supplements previously.  -Pt had 3 episodes loose stools prior to admission. Currently denied any n/v. MD noted pt with pyelonephritis vs colitis -Minimal PO intake of clear liquid diet d/t general dislike of clear liquid diet food options. Eager to begin solid foods   Height: Ht Readings from Last 1 Encounters:  02/12/13 5\' 3"  (1.6 m)    Weight: Wt Readings from Last 1 Encounters:  02/12/13 148 lb 6.4 oz (67.314 kg)    Ideal Body Weight: 115 lbs  % Ideal Body Weight: 129%  Wt Readings from Last 10 Encounters:  02/12/13 148 lb 6.4 oz (67.314 kg)  01/28/13 156 lb 4.8 oz (70.897 kg)  01/04/13 160 lb 7.9 oz (72.8 kg)  07/06/12 167 lb 12.8 oz (76.114 kg)  06/29/12 170 lb 3.2 oz (77.202 kg)   05/27/12 174 lb (78.926 kg)  04/29/12 176 lb (79.833 kg)  04/08/12 185 lb 8 oz (84.142 kg)  04/08/12 185 lb 8 oz (84.142 kg)  04/06/12 185 lb 8 oz (84.142 kg)    Usual Body Weight: 160 lbs  % Usual Body Weight: 93%  BMI:  Body mass index is 26.29 kg/(m^2).  Estimated Nutritional Needs: Kcal: 1700-1900 Protein: 55-65 Fluid: 1900 ml/daily  Skin: WNL  Diet Order: Clear Liquid  EDUCATION NEEDS: -No education needs identified at this time   Intake/Output Summary (Last 24 hours) at 02/12/13 0839 Last data filed at 02/12/13 0515  Gross per 24 hour  Intake   1240 ml  Output      0 ml  Net   1240 ml    Last BM: None currently documented   Labs:   Recent Labs Lab 02/12/13 0209 02/12/13 0225  NA 134* 135  K 4.3 4.3  CL 98 100  CO2 25  --   BUN 26* 25*  CREATININE 1.29* 1.40*  CALCIUM 9.3  --   GLUCOSE 129* 128*    CBG (last 3)   Recent Labs  02/12/13 0211  GLUCAP 106*    Scheduled Meds: . aspirin EC  81 mg Oral Daily  . calcium-vitamin D  1 tablet Oral BID WC  . famotidine  20 mg Oral BID  . ferrous sulfate  325 mg Oral Daily  . hydrocortisone sod succinate (SOLU-CORTEF) inj  50 mg Intravenous Once  . imipenem-cilastatin  250 mg Intravenous Q8H  . predniSONE  20 mg Oral Q breakfast  . sodium chloride  3 mL Intravenous Q12H    Continuous Infusions: . sodium chloride 100 mL/hr at 02/12/13 1610    Past Medical History  Diagnosis Date  . Leukocytopenia   . HTN (hypertension)   . Asthma   . IBS (irritable bowel syndrome)   . Anxiety   . Depression   . Chronic kidney disease (CKD), stage III (moderate)   . Hypercalcemia   . Fatigue   . Lymphopenia 08/23/2011  . COPD (chronic obstructive pulmonary disease)   . Renal insufficiency   . UTI (urinary tract infection) 12/26/2011  . Syncope and collapse 12/24/2011    "loss of consciousness for 3-4 min" (12/26/2011)  . Hypercholesteremia     "above borderline; I don't take RX for it" (12/26/2011)  .  H/O hiatal hernia   . GERD (gastroesophageal reflux disease)   . Arthritis     "knees and back" (12/26/2011)  . Osteoporosis   . 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 Iron deficiency anemia     [facility]    Past Surgical History  Procedure Laterality Date  . Hernia repair  1970's    "stomach" (12/26/2011)  . Abdominal hysterectomy  ~ 1980  . Esophagogastroduodenoscopy  12/26/2011    Procedure: ESOPHAGOGASTRODUODENOSCOPY (EGD);  Surgeon: Louis Meckel, MD;  Location: [facility] ENDOSCOPY;  Service: Endoscopy;  Laterality: N/A;  . Breast surgery  04-06-12    rt. lumpectomy_benign  . Tubal ligation    . Laparoscopic nissen fundoplication N/A 04/08/2012    Procedure: Laparoscopic Reduction and Repair of Paraesophagel Hiatal Hernia with Nissen;  Surgeon: Ardeth Sportsman, MD;  Location: [facility] ORS;  Service: General;  Laterality: N/A;  Laparoscopic Reduction and Repair of Paraesophagel Hiatal Hernia with Nissen    Lloyd Huger MS RD LDN Clinical Dietitian Pager:[phone]

## 2013-02-12 NOTE — ED Notes (Signed)
Dr. Kirtland Bouchard in to see patient to admit

## 2013-02-12 NOTE — ED Notes (Signed)
Patient presents via EMS after having a syncopal episode on the toilet.  Stated she has ben sick in the bed all day, just sick in the stomach.  States she had diarrhea 3 times earlier.  +nausea but no vomiting.

## 2013-02-13 LAB — CBC
HCT: 26.5 % — ABNORMAL LOW (ref 36.0–46.0)
Hemoglobin: 8.8 g/dL — ABNORMAL LOW (ref 12.0–15.0)
MCH: 32.2 pg (ref 26.0–34.0)
MCHC: 33.2 g/dL (ref 30.0–36.0)
MCV: 97.1 fL (ref 78.0–100.0)
RBC: 2.73 MIL/uL — ABNORMAL LOW (ref 3.87–5.11)
RDW: 16.2 % — ABNORMAL HIGH (ref 11.5–15.5)

## 2013-02-13 LAB — BASIC METABOLIC PANEL
BUN: 16 mg/dL (ref 6–23)
CO2: 22 mEq/L (ref 19–32)
Chloride: 103 mEq/L (ref 96–112)
Creatinine, Ser: 1.02 mg/dL (ref 0.50–1.10)
GFR calc Af Amer: 62 mL/min — ABNORMAL LOW (ref >90)
Potassium: 4.1 mEq/L (ref 3.5–5.1)
Sodium: 134 mEq/L — ABNORMAL LOW (ref 135–145)

## 2013-02-13 LAB — TYPE AND SCREEN
ABO/RH(D): O POS
Antibody Screen: NEGATIVE
Unit division: 0

## 2013-02-13 MED ORDER — HYDROCODONE-ACETAMINOPHEN 5-325 MG PO TABS
1.0000 | ORAL_TABLET | Freq: Three times a day (TID) | ORAL | Status: DC | PRN
  Administered 2013-02-13: 1 via ORAL
  Filled 2013-02-13: qty 1

## 2013-02-13 MED ORDER — IMIPENEM-CILASTATIN 250 MG IV SOLR
250.0000 mg | Freq: Four times a day (QID) | INTRAVENOUS | Status: DC
  Administered 2013-02-13 – 2013-02-15 (×8): 250 mg via INTRAVENOUS
  Filled 2013-02-13 (×9): qty 250

## 2013-02-13 NOTE — Progress Notes (Signed)
ANTIBIOTIC CONSULT NOTE - FOLLOW UP  Pharmacy Consult for Imipenem Indication: pyelonephritis/bacteremia  Allergies  Allergen Reactions  . Penicillins Hives  . Brovana [Arformoterol] Other (See Comments)    "makes me nervous"  . Paxil [Paroxetine Hcl] Rash    Patient Measurements: Height: 5\' 3"  (160 cm) Weight: 148 lb 6.4 oz (67.314 kg) IBW/kg (Calculated) : 52.4   Vital Signs: Temp: 97.9 F (36.6 C) (12/27 1256) Temp src: Oral (12/27 1256) BP: 108/46 mmHg (12/27 0444) Pulse Rate: 62 (12/27 0444) Intake/Output from previous day: 12/26 0701 - 12/27 0700 In: 3 [I.V.:3] Out: 500 [Urine:500] Intake/Output from this shift:    Labs:  Recent Labs  02/12/13 0209 02/12/13 0225 02/12/13 0822 02/13/13 0420  WBC 7.4  --  5.3 4.4  HGB 9.1* 9.2* 7.7* 8.8*  PLT 221  --  197 190  CREATININE 1.29* 1.40* 1.12* 1.02   Estimated Creatinine Clearance: 45.3 ml/min (by C-G formula based on Cr of 1.02). No results found for this basename: VANCOTROUGH, Leodis Binet, VANCORANDOM, GENTTROUGH, GENTPEAK, GENTRANDOM, TOBRATROUGH, TOBRAPEAK, TOBRARND, AMIKACINPEAK, AMIKACINTROU, AMIKACIN,  in the last 72 hours   Microbiology: Recent Results (from the past 720 hour(s))  URINE CULTURE     Status: None   Collection Time    02/12/13  3:50 AM      Result Value Range Status   Specimen Description URINE, CLEAN CATCH   Final   Special Requests NONE   Final   Culture  Setup Time     Final   Value: 02/12/2013 08:49     Performed at [facility] Count     Final   Value: >=100,000 COLONIES/ML     Performed at [facility]   Culture     Final   Value: ESCHERICHIA COLI     Performed at [facility]   Report Status PENDING   Incomplete  CULTURE, BLOOD (ROUTINE X 2)     Status: None   Collection Time    02/12/13  8:22 AM      Result Value Range Status   Specimen Description BLOOD RIGHT ANTECUBITAL   Final   Special Requests BOTTLES DRAWN AEROBIC AND ANAEROBIC  10CC   Final   Culture  Setup Time     Final   Value: 02/12/2013 14:26     Performed at [facility]   Culture     Final   Value:        BLOOD CULTURE RECEIVED NO GROWTH TO DATE CULTURE WILL BE HELD FOR 5 DAYS BEFORE ISSUING A FINAL NEGATIVE REPORT     Performed at [facility]   Report Status PENDING   Incomplete  CULTURE, BLOOD (ROUTINE X 2)     Status: None   Collection Time    02/12/13  8:30 AM      Result Value Range Status   Specimen Description BLOOD HAND RIGHT   Final   Special Requests BOTTLES DRAWN AEROBIC ONLY 10CC   Final   Culture  Setup Time     Final   Value: 02/12/2013 14:26     Performed at [facility]   Culture     Final   Value: GRAM NEGATIVE RODS     3086 Note: Gram Stain Report Called to,Read Back By and Verified With: TANIA MELTON 02/13/13 [facility]     Performed at [facility]   Report Status PENDING   Incomplete    Anti-infectives   Start  Dose/Rate Route Frequency Ordered Stop   02/13/13 1800  imipenem-cilastatin (PRIMAXIN) 250 mg in sodium chloride 0.9 % 100 mL IVPB     250 mg 200 mL/hr over 30 Minutes Intravenous 4 times per day 02/13/13 1339     02/12/13 0700  imipenem-cilastatin (PRIMAXIN) 250 mg in sodium chloride 0.9 % 100 mL IVPB  Status:  Discontinued     250 mg 200 mL/hr over 30 Minutes Intravenous 3 times per day 02/12/13 0656 02/13/13 1339   02/12/13 0515  cefTRIAXone (ROCEPHIN) 1 g in dextrose 5 % 50 mL IVPB     1 g 100 mL/hr over 30 Minutes Intravenous  Once 02/12/13 0511 02/12/13 1191      Assessment: 72 yo female present w/ syncopal episodes. Patient growing GNR in blood, and E.Coli in urine. Likely similar to prior infections both in blood and urine w/ ESBL organism. Renal function is improving to CrCl 45.3 will renally adjust antibiotics  Plan:  1. Imipenem 250 mg IV q6h 2. Monitor renal function, clinical progression  Follow up culture results  Dolphus Jenny 02/13/2013,1:40 PM

## 2013-02-13 NOTE — Progress Notes (Signed)
Gram stain report received from lab. Pt has positive blood cultures, aerobic, gram negative rods. On call Np notified, advised to take no action at this time, pt already receiving Primaxin.

## 2013-02-13 NOTE — Progress Notes (Signed)
[facility] PROGRESS NOTE  Nicole Meyer:[id number] DOB: 27-Jun-1940 DOA: 02/12/2013 PCP: Thora Lance, MD  Assessment/Plan: 1-Syncope - most likely secondary to dehydration from recent diarrhea and poor intake. Patient was orthostatic in the ER. Patient did receive 1 L normal saline bolus in the ED.  Continue with IV fluids.  Will reorder orthostatic vitals.   2-Abdominal pain suspect pyelonephritis; resolved. CT negative for colitis.   3-pyelonephritis/ Gram negative rod  Bacteremia:  urine culture grew E coli. Sensitivity pending. Patient with Prior history of ESBL. [name] Continue with Primaxin day 2. She will need repeat blood culture. Will order blood culture for 12-28.   4-Diarrhea - C diff ordered. No BM today.  5-History of polymyalgia rheumatica - patient has been recently restarted on steroids for polymyalgia rheumatica week ago. Received one dose  IV hydrocortisone 50 mg for stress dose. Continue with prednisone.   6-History of hypertension - holding off antihypertensives for now due to her orthostatic changes. PRN Hydralazine.   7-Chronic kidney disease - cr range 1.0 to 1.2. Continue with IV fluids. Cr trending down.   8-Anemia - prior history of iron deficiency anemia, component anemia of chronic diseases. Hb decrease to 7.7. No evidence of active bleeding . S/P one unit  PRBC 12-26. She will follow with cancer center. Hb increase to 8.8.   Code Status: Full Code.  Family Communication: care discussed with patient.  Disposition Plan: Remain inpatient.    Consultants:  none  Procedures:  none  Antibiotics:  Primaxin 12-26  HPI/Subjective: No BM today, no melena.  Denies abdominal pain.  Feels better today, stronger.   Objective: Filed Vitals:   02/13/13 1400  BP: 110/52  Pulse: 65  Temp: 98 F (36.7 C)  Resp: 17    Intake/Output Summary (Last 24 hours) at 02/13/13 1645 Last data filed at 02/12/13 2145  Gross per 24 hour  Intake       3 ml  Output    500 ml  Net   -497 ml   Filed Weights   02/12/13 0155 02/12/13 0649  Weight: 68.04 kg (150 lb) 67.314 kg (148 lb 6.4 oz)    Exam:   General: no distress.   Cardiovascular: S 1, S 2 RRR  Respiratory: CTA  Abdomen: BS present, soft, nt  Musculoskeletal: no edema  Data Reviewed: Basic Metabolic Panel:  Recent Labs Lab 02/12/13 0209 02/12/13 0225 02/12/13 0822 02/13/13 0420  NA 134* 135 135 134*  K 4.3 4.3 4.3 4.1  CL 98 100 101 103  CO2 25  --  24 22  GLUCOSE 129* 128* 105* 101*  BUN 26* 25* 24* 16  CREATININE 1.29* 1.40* 1.12* 1.02  CALCIUM 9.3  --  8.6 8.6   Liver Function Tests:  Recent Labs Lab 02/12/13 0822  AST 12  ALT 9  ALKPHOS 65  BILITOT 2.0*  PROT 6.3  ALBUMIN 2.7*   No results found for this basename: LIPASE, AMYLASE,  in the last 168 hours No results found for this basename: AMMONIA,  in the last 168 hours CBC:  Recent Labs Lab 02/12/13 0209 02/12/13 0224 02/12/13 0225 02/12/13 0822 02/13/13 0420  WBC 7.4  --   --  5.3 4.4  NEUTROABS  --  6.2  --  4.3  --   HGB 9.1*  --  9.2* 7.7* 8.8*  HCT 27.3*  --  27.0* 23.2* 26.5*  MCV 101.5*  --   --  101.8* 97.1  PLT 221  --   --  197 190   Cardiac Enzymes:  Recent Labs Lab 02/12/13 0822  CKTOTAL 77  TROPONINI <0.30   BNP (last 3 results) No results found for this basename: PROBNP,  in the last 8760 hours CBG:  Recent Labs Lab 02/12/13 0211  GLUCAP 106*    Recent Results (from the past 240 hour(s))  URINE CULTURE     Status: None   Collection Time    02/12/13  3:50 AM      Result Value Range Status   Specimen Description URINE, CLEAN CATCH   Final   Special Requests NONE   Final   Culture  Setup Time     Final   Value: 02/12/2013 08:49     Performed at [facility] Count     Final   Value: >=100,000 COLONIES/ML     Performed at [facility]   Culture     Final   Value: ESCHERICHIA COLI     Performed at [facility]    Report Status PENDING   Incomplete  CULTURE, BLOOD (ROUTINE X 2)     Status: None   Collection Time    02/12/13  8:22 AM      Result Value Range Status   Specimen Description BLOOD RIGHT ANTECUBITAL   Final   Special Requests BOTTLES DRAWN AEROBIC AND ANAEROBIC 10CC   Final   Culture  Setup Time     Final   Value: 02/12/2013 14:26     Performed at [facility]   Culture     Final   Value:        BLOOD CULTURE RECEIVED NO GROWTH TO DATE CULTURE WILL BE HELD FOR 5 DAYS BEFORE ISSUING A FINAL NEGATIVE REPORT     Performed at [facility]   Report Status PENDING   Incomplete  CULTURE, BLOOD (ROUTINE X 2)     Status: None   Collection Time    02/12/13  8:30 AM      Result Value Range Status   Specimen Description BLOOD HAND RIGHT   Final   Special Requests BOTTLES DRAWN AEROBIC ONLY 10CC   Final   Culture  Setup Time     Final   Value: 02/12/2013 14:26     Performed at [facility]   Culture     Final   Value: GRAM NEGATIVE RODS     4098 Note: Gram Stain Report Called to,Read Back By and Verified With: TANIA MELTON 02/13/13 [facility]     Performed at [facility]   Report Status PENDING   Incomplete     Studies: Ct Abdomen Pelvis Wo Contrast  02/12/2013   CLINICAL DATA:  Syncope. Lower back pain and nausea. Bacteria on urinalysis. Assess for hydronephrosis.  EXAM: CT ABDOMEN AND PELVIS WITHOUT CONTRAST  TECHNIQUE: Multidetector CT imaging of the abdomen and pelvis was performed following the standard protocol without intravenous contrast.  COMPARISON:  CT of the abdomen and pelvis performed 12/28/2012, and abdominal ultrasound performed 01/06/2013  FINDINGS: The visualized lung bases are clear. Scattered coronary artery calcifications are seen. Postoperative change is noted about the gastroesophageal junction.  Apparent scarring is noted within the left hepatic lobe. A few calcified granulomata are seen within the liver. The spleen is unremarkable in  appearance. The gallbladder is within normal limits. The pancreas and adrenal glands are unremarkable.  There is no evidence of hydronephrosis. Mild nonspecific perinephric stranding is noted bilaterally. There is slight prominence of  the proximal left ureter, but this likely remains within normal limits. No renal or ureteral stones are seen.  No free fluid is identified. The small bowel is unremarkable in appearance. The stomach is within normal limits. No acute vascular abnormalities are seen. Scattered calcification is noted along the abdominal aorta and its branches.  The appendix is not definitely seen; there is no evidence for appendicitis. The colon is largely decompressed. Mild diverticulosis is noted along the sigmoid colon, without evidence of diverticulitis.  The bladder is mildly distended and grossly unremarkable. The patient is status post hysterectomy. No suspicious adnexal masses are seen. The left ovary is unremarkable in appearance. No inguinal lymphadenopathy is seen.  No acute osseous abnormalities are identified.  IMPRESSION: 1. No evidence of hydronephrosis.  No renal or ureteral stones seen. 2. Scattered calcification along the abdominal aorta and its branches. 3. Scattered coronary artery calcifications seen. 4. Scarring within the left hepatic lobe. 5. Mild diverticulosis along the sigmoid colon, without evidence of diverticulitis.   Electronically Signed   By: Roanna Raider [name]   On: 02/12/2013 06:52   Dg Chest 2 View  02/12/2013   CLINICAL DATA:  Chest pain; syncope.  Shortness of breath.  EXAM: CHEST  2 VIEW  COMPARISON:  Chest radiograph performed 01/08/2013  FINDINGS: The lungs are well-aerated. Pulmonary vascularity is at the upper limits of normal. There is no evidence of focal opacification, pleural effusion or pneumothorax.  The heart is normal in size; the mediastinal contour is within normal limits. No acute osseous abnormalities are seen.  IMPRESSION: No acute  cardiopulmonary process seen.   Electronically Signed   By: Roanna Raider [name]   On: 02/12/2013 02:46   Dg Lumbar Spine 2-3 Views  02/12/2013   CLINICAL DATA:  Status post fall; lower back pain.  EXAM: LUMBAR SPINE - 2-3 VIEW  COMPARISON:  CT of the abdomen and pelvis performed 01/03/2013  FINDINGS: There is no evidence of fracture or subluxation. Vertebral bodies demonstrate normal height and alignment. Intervertebral disc spaces are preserved. The visualized neural foramina are grossly unremarkable in appearance.  The visualized bowel gas pattern is unremarkable in appearance; air and stool are noted within the colon. The sacroiliac joints are within normal limits. Diffuse calcification is noted along the abdominal aorta.  IMPRESSION: No evidence of fracture or subluxation along the lumbar spine.   Electronically Signed   By: Roanna Raider [name]   On: 02/12/2013 02:45   Ct Head Wo Contrast  02/12/2013   CLINICAL DATA:  Syncope.  EXAM: CT HEAD WITHOUT CONTRAST  TECHNIQUE: Contiguous axial images were obtained from the base of the skull through the vertex without intravenous contrast.  COMPARISON:  CT of the head performed 07/01/2003  FINDINGS: There is no evidence of acute infarction, mass lesion, or intra- or extra-axial hemorrhage on CT.  Prominence of the ventricles and sulci suggests mild cortical volume loss. Mild periventricular white matter change likely reflects small vessel ischemic microangiopathy.  The brainstem and fourth ventricle are within normal limits. The basal ganglia are unremarkable in appearance. The cerebral hemispheres demonstrate grossly normal gray-white differentiation. No mass effect or midline shift is seen.  There is no evidence of fracture; visualized osseous structures are unremarkable in appearance. The visualized portions of the orbits are within normal limits. The paranasal sinuses and mastoid air cells are well-aerated. No significant soft tissue abnormalities are seen.   IMPRESSION: 1. No acute intracranial pathology seen on CT. 2. Mild cortical volume loss and  minimal small vessel ischemic microangiopathy.   Electronically Signed   By: Roanna Raider [name]   On: 02/12/2013 02:31    Scheduled Meds: . calcium-vitamin D  1 tablet Oral BID WC  . famotidine  20 mg Oral BID  . feeding supplement (RESOURCE BREEZE)  1 Container Oral BID BM  . ferrous sulfate  325 mg Oral Daily  . imipenem-cilastatin  250 mg Intravenous Q6H  . predniSONE  20 mg Oral Q breakfast  . sodium chloride  3 mL Intravenous Q12H   Continuous Infusions:    Principal Problem:   Syncope Active Problems:   Anemia   Chronic kidney disease (CKD), stage III (moderate)   Pyelonephritis    Time spent: 30 minutes.     Makayli Bracken  [facility] Pager [phone]. If 7PM-7AM, please contact night-coverage at [email or web address], password [facility] 02/13/2013, 4:45 PM  LOS: 1 day

## 2013-02-14 DIAGNOSIS — A498 Other bacterial infections of unspecified site: Secondary | ICD-10-CM

## 2013-02-14 DIAGNOSIS — B9689 Other specified bacterial agents as the cause of diseases classified elsewhere: Secondary | ICD-10-CM

## 2013-02-14 DIAGNOSIS — R7881 Bacteremia: Secondary | ICD-10-CM

## 2013-02-14 DIAGNOSIS — D649 Anemia, unspecified: Secondary | ICD-10-CM

## 2013-02-14 DIAGNOSIS — Z1619 Resistance to other specified beta lactam antibiotics: Secondary | ICD-10-CM

## 2013-02-14 LAB — URINE CULTURE: Colony Count: >=100000

## 2013-02-14 LAB — CBC
Hemoglobin: 8 g/dL — ABNORMAL LOW (ref 12.0–15.0)
MCH: 33.2 pg (ref 26.0–34.0)
MCHC: 34.6 g/dL (ref 30.0–36.0)
MCV: 95.9 fL (ref 78.0–100.0)
Platelets: 161 10*3/uL (ref 150–400)
RBC: 2.41 MIL/uL — ABNORMAL LOW (ref 3.87–5.11)

## 2013-02-14 LAB — BASIC METABOLIC PANEL
BUN: 16 mg/dL (ref 6–23)
CO2: 22 mEq/L (ref 19–32)
Calcium: 8.5 mg/dL (ref 8.4–10.5)
Creatinine, Ser: 0.94 mg/dL (ref 0.50–1.10)
GFR calc non Af Amer: 59 mL/min — ABNORMAL LOW (ref >90)
Glucose, Bld: 84 mg/dL (ref 70–99)
Sodium: 139 mEq/L (ref 135–145)

## 2013-02-14 MED ORDER — BISACODYL 10 MG RE SUPP: 10.0000 mg | Freq: Every day | RECTAL | Status: DC | PRN

## 2013-02-14 MED ORDER — LORAZEPAM 0.5 MG PO TABS
0.5000 mg | ORAL_TABLET | Freq: Once | ORAL | Status: AC
  Administered 2013-02-14: 0.5 mg via ORAL
  Filled 2013-02-14: qty 1

## 2013-02-14 MED ORDER — ZOLPIDEM TARTRATE 5 MG PO TABS
5.0000 mg | ORAL_TABLET | Freq: Every evening | ORAL | Status: DC | PRN
  Administered 2013-02-15: 5 mg via ORAL
  Filled 2013-02-14: qty 1

## 2013-02-14 MED ORDER — POLYETHYLENE GLYCOL 3350 17 G PO PACK
17.0000 g | PACK | Freq: Every day | ORAL | Status: DC
  Administered 2013-02-14: 17 g via ORAL
  Filled 2013-02-14 (×2): qty 1

## 2013-02-14 NOTE — Progress Notes (Signed)
[facility] PROGRESS NOTE  Nicole Meyer ZOX:[id number] DOB: 06-26-1940 DOA: 02/12/2013 PCP: Thora Lance, MD  Assessment/Plan:  1-Syncope - most likely secondary to dehydration from recent diarrhea and poor intake. Patient was orthostatic in the ER, repeat orthostatics are normal.Patient did receive 1 L normal saline bolus in the ED.     2-Abdominal pain suspect pyelonephritis; resolved. CT negative for colitis.   3- GNR bacetermia- Continue with primaxin, repeat blood cultures have been obtained,  4. Possible pyelonephritis; follow urine culture. Continue with Primaxin.   5-History of polymyalgia rheumatica - patient has been recently restarted on steroids for polymyalgia rheumatica week ago. Received one dose  IV hydrocortisone 50 mg for stress dose. Continue with prednisone.   6-History of hypertension - holding off antihypertensives for now due to her orthostatic changes. PRN Hydralazine.   7-Chronic kidney disease - cr range 1.0 to 1.2. Continue with IV fluids.   8-Anemia - prior history of iron deficiency anemia, component anemia of chronic diseases. Hb decrease to 7.7. No evidence of active bleeding . Patient feel more weak today. Will transfuse one unit of PRBC. She follow with cancer center.   9-Constipation-  Patient has not had BM since 12/24, will start Miralx and prn dulcolax.  Code Status: Full Code.  Family Communication: care discussed with patient.  Disposition Plan: Remain inpatient.    Consultants:  none  Procedures:  none  Antibiotics:  Primaxin 12-26  HPI/Subjective: Patient seen and examined, complains of constipation, no BM since 12/24  Objective: Filed Vitals:   02/14/13 0500  BP: 145/73  Pulse: 63  Temp: 98.3 F (36.8 C)  Resp: 16    Intake/Output Summary (Last 24 hours) at 02/14/13 0959 Last data filed at 02/14/13 0013  Gross per 24 hour  Intake    900 ml  Output    300 ml  Net    600 ml   Filed Weights   02/12/13  0155 02/12/13 0649  Weight: 68.04 kg (150 lb) 67.314 kg (148 lb 6.4 oz)    Exam:  Physical Exam: Head: Normocephalic, atraumatic.  Eyes: No signs of jaundice, EOMI Nose: Mucous membranes dry.  Throat: Oropharynx nonerythematous, no exudate appreciated.  Neck: supple,No deformities, masses, or tenderness noted. Lungs: Normal respiratory effort. B/L Clear to auscultation, no crackles or wheezes.  Heart: Regular RR. S1 and S2 normal  Abdomen: BS normoactive. Soft, Nondistended, non-tender.  Extremities: No pretibial edema, no erythema  Data Reviewed: Basic Metabolic Panel:  Recent Labs Lab 02/12/13 0209 02/12/13 0225 02/12/13 0822 02/13/13 0420 02/14/13 0715  NA 134* 135 135 134* 139  K 4.3 4.3 4.3 4.1 3.9  CL 98 100 101 103 109  CO2 25  --  24 22 22   GLUCOSE 129* 128* 105* 101* 84  BUN 26* 25* 24* 16 16  CREATININE 1.29* 1.40* 1.12* 1.02 0.94  CALCIUM 9.3  --  8.6 8.6 8.5   Liver Function Tests:  Recent Labs Lab 02/12/13 0822  AST 12  ALT 9  ALKPHOS 65  BILITOT 2.0*  PROT 6.3  ALBUMIN 2.7*   No results found for this basename: LIPASE, AMYLASE,  in the last 168 hours No results found for this basename: AMMONIA,  in the last 168 hours CBC:  Recent Labs Lab 02/12/13 0209 02/12/13 0224 02/12/13 0225 02/12/13 0822 02/13/13 0420 02/14/13 0715  WBC 7.4  --   --  5.3 4.4 3.3*  NEUTROABS  --  6.2  --  4.3  --   --  HGB 9.1*  --  9.2* 7.7* 8.8* 8.0*  HCT 27.3*  --  27.0* 23.2* 26.5* 23.1*  MCV 101.5*  --   --  101.8* 97.1 95.9  PLT 221  --   --  197 190 161   Cardiac Enzymes:  Recent Labs Lab 02/12/13 0822  CKTOTAL 77  TROPONINI <0.30   BNP (last 3 results) No results found for this basename: PROBNP,  in the last 8760 hours CBG:  Recent Labs Lab 02/12/13 0211  GLUCAP 106*    Recent Results (from the past 240 hour(s))  URINE CULTURE     Status: None   Collection Time    02/12/13  3:50 AM      Result Value Range Status   Specimen  Description URINE, CLEAN CATCH   Final   Special Requests NONE   Final   Culture  Setup Time     Final   Value: 02/12/2013 08:49     Performed at [facility] Count     Final   Value: >=100,000 COLONIES/ML     Performed at [facility]   Culture     Final   Value: ESCHERICHIA COLI     Performed at [facility]   Report Status PENDING   Incomplete  CULTURE, BLOOD (ROUTINE X 2)     Status: None   Collection Time    02/12/13  8:22 AM      Result Value Range Status   Specimen Description BLOOD RIGHT ANTECUBITAL   Final   Special Requests BOTTLES DRAWN AEROBIC AND ANAEROBIC 10CC   Final   Culture  Setup Time     Final   Value: 02/12/2013 14:26     Performed at [facility]   Culture     Final   Value:        BLOOD CULTURE RECEIVED NO GROWTH TO DATE CULTURE WILL BE HELD FOR 5 DAYS BEFORE ISSUING A FINAL NEGATIVE REPORT     Performed at [facility]   Report Status PENDING   Incomplete  CULTURE, BLOOD (ROUTINE X 2)     Status: None   Collection Time    02/12/13  8:30 AM      Result Value Range Status   Specimen Description BLOOD HAND RIGHT   Final   Special Requests BOTTLES DRAWN AEROBIC ONLY 10CC   Final   Culture  Setup Time     Final   Value: 02/12/2013 14:26     Performed at [facility]   Culture     Final   Value: GRAM NEGATIVE RODS     1610 Note: Gram Stain Report Called to,Read Back By and Verified With: Paulo Fruit 02/13/13 [facility]     Performed at [facility]   Report Status PENDING   Incomplete     Studies: No results found.  Scheduled Meds: . calcium-vitamin D  1 tablet Oral BID WC  . famotidine  20 mg Oral BID  . feeding supplement (RESOURCE BREEZE)  1 Container Oral BID BM  . ferrous sulfate  325 mg Oral Daily  . imipenem-cilastatin  250 mg Intravenous Q6H  . polyethylene glycol  17 g Oral Daily  . predniSONE  20 mg Oral Q breakfast  . sodium chloride  3 mL Intravenous Q12H   Continuous  Infusions:    Principal Problem:   Syncope Active Problems:   Anemia   Chronic kidney disease (CKD), stage III (moderate)  Pyelonephritis    Time spent: 35 minutes.     [facility]  [facility] Pager [phone]. If 7PM-7AM, please contact night-coverage at [email or web address], password [facility] 02/14/2013, 9:59 AM  LOS: 2 days

## 2013-02-15 DIAGNOSIS — I10 Essential (primary) hypertension: Secondary | ICD-10-CM

## 2013-02-15 DIAGNOSIS — R42 Dizziness and giddiness: Secondary | ICD-10-CM

## 2013-02-15 LAB — CBC
MCH: 33.8 pg (ref 26.0–34.0)
MCHC: 35.3 g/dL (ref 30.0–36.0)
Platelets: 172 10*3/uL (ref 150–400)
RDW: 14.8 % (ref 11.5–15.5)

## 2013-02-15 LAB — BASIC METABOLIC PANEL
CO2: 23 mEq/L (ref 19–32)
Calcium: 8.8 mg/dL (ref 8.4–10.5)
GFR calc Af Amer: 66 mL/min — ABNORMAL LOW (ref >90)
GFR calc non Af Amer: 57 mL/min — ABNORMAL LOW (ref >90)
Glucose, Bld: 84 mg/dL (ref 70–99)
Potassium: 3.9 mEq/L (ref 3.5–5.1)
Sodium: 140 mEq/L (ref 135–145)

## 2013-02-15 LAB — CULTURE, BLOOD (ROUTINE X 2)

## 2013-02-15 MED ORDER — LISINOPRIL 10 MG PO TABS: 10.0000 mg | ORAL_TABLET | Freq: Every day | ORAL | Status: DC

## 2013-02-15 MED ORDER — SODIUM CHLORIDE 0.9 % IV SOLN: 1.0000 g | INTRAVENOUS | Status: AC

## 2013-02-15 MED ORDER — SODIUM CHLORIDE 0.9 % IV SOLN
1.0000 g | INTRAVENOUS | Status: DC
  Filled 2013-02-15: qty 1

## 2013-02-15 MED ORDER — SODIUM CHLORIDE 0.9 % IJ SOLN: 10.0000 mL | INTRAMUSCULAR | Status: DC | PRN

## 2013-02-15 NOTE — Progress Notes (Signed)
Physician Discharge Summary  Nicole Meyer WUJ:[id number] DOB: 11-06-1940 DOA: 02/12/2013  PCP: Thora Lance, MD  Admit date: 02/12/2013 Discharge date: 02/15/2013  Time spent: 50* minutes  Recommendations for Outpatient Follow-up:  1. *Follow up PCP in 2 weeks  Discharge Diagnoses:  Principal Problem:   Syncope Active Problems:   Anemia   Chronic kidney disease (CKD), stage III (moderate)   Pyelonephritis   Discharge Condition: Stable  Diet recommendation: Low salt diet  Filed Weights   02/12/13 0155 02/12/13 0649  Weight: 68.04 kg (150 lb) 67.314 kg (148 lb 6.4 oz)    History of present illness:  72 y.o. female history of chronic kidney disease, hypertension, polymyalgia rheumatica who was admitted last month with Escherichia coli ESBL bacteremia and was on ertapenem IV for 2 weeks and of course was over the last week presented to the year because patient had a syncopal episode. As per patient she felt uncomfortable and abdomen and went to urinate. Patient's husband heard a sound and went to the bathroom to see patient was unconscious. She was unconscious for a few minutes and regained consciousness spontaneously. She did not have any tongue bite or incontinence of urine or seizure-like activities. Patient denies any chest pain or shortness of breath. Patient has been not feeling well since yesterday morning. She had 3 episodes of diarrhea day before yesterday. She's been feeling weak and tired. Last week since he had some lower extremity pain her primary care physician had restarted her on prednisone for polymyalgia rheumatica. Yesterday patient was lying on the bed the whole day. Denies any nausea vomiting fever chills or any focal deficits. In the ER patient was found to be orthostatic and was given 1 L normal saline bolus. Patient's UA shows features of UTI. Since patient has been having lower quadrant abdominal pain CT abdomen and pelvis is pending.    Hospital Course:   -Syncope - Resolved, most likely secondary to dehydration from recent diarrhea and poor intake. Patient was orthostatic in the ER, repeat orthostatics are normal.Patient did receive 1 L normal saline bolus in the ED. Tele showed normal sinus rhythm. Patient had 3 episodes of syncope over the past 6 months. I would a syncopal event monitor. Called cardiology to set up event monitor as outpatient.  2-Abdominal pain suspect pyelonephritis; resolved. CT negative for colitis. Patient has ESBL E Coli UTI, she has been started on primaxin and will be discharged home on Invanz.  3- GNR bacetermia- patient had positive blood cultures with ESBL Escherichia coli on 26 December, repeat blood cultures on 28th of December have been negative so far. I called and discussed with infectious disease Dr. Luciana Axe and he recommends to start the patient on Invanz for 6 more days. We'll obtain PICC line and discharge patient on Invanz.  4. Possible pyelonephritis; follow urine culture. Continue with Invanz for 6 more days.  5-History of polymyalgia rheumatica - patient has been recently restarted on steroids for polymyalgia rheumatica week ago. Received one dose IV hydrocortisone 50 mg for stress dose. Continue with prednisone.   6-History of hypertension - patient came in with hypotension him, and her blood pressure was stable without medications in the hospital at this time the blood pressure is 147/70. I will start the patient back on lisinopril 10 mg by mouth daily. We'll stop HCTZ at this time. 7-Chronic kidney disease - her creatinine is back to baseline.  8-Anemia - prior history of iron deficiency anemia, component anemia of chronic diseases. Hb decreased  to 7.7. No evidence of active bleeding. Patient was transfused one unit of PRBC. She will follow up with cancer [facility] for anemia. Hemoglobin today is 7.8. Patient has chronic anemia and requires episodic  of blood transfusion.  Procedures:  PICC  line  Consultations:  None  Discharge Exam: Filed Vitals:   02/15/13 0500  BP: 147/70  Pulse: 65  Temp: 97.9 F (36.6 C)  Resp: 16    General: Appears in no acute distress Cardiovascular: S1-S2 regular Respiratory: Clear bilaterally  Discharge Instructions  Discharge Orders   Future Appointments Provider Department Dept Phone   03/08/2013 2:30 PM [facility] [phone]   03/08/2013 3:00 PM Artis Delay, MD [facility] [phone]   Future Orders Complete By Expires   Diet - low sodium heart healthy  As directed    Discharge instructions  As directed    Comments:     Home health RN to give IV antibiotics for next 6 days   Increase activity slowly  As directed        Medication List    STOP taking these medications       lisinopril-hydrochlorothiazide 20-12.5 MG per tablet  Commonly known as:  PRINZIDE,ZESTORETIC      TAKE these medications       albuterol (2.5 MG/3ML) 0.083% nebulizer solution  Commonly known as:  PROVENTIL  Take 2.5 mg by nebulization every 6 (six) hours as needed for wheezing or shortness of breath. Wheezing and shortness of breath     aspirin EC 81 MG tablet  Take 81 mg by mouth daily.     beta carotene w/minerals tablet  Take 1 tablet by mouth daily.     calcium-vitamin D 500-200 MG-UNIT per tablet  Commonly known as:  OSCAL WITH D  Take 1 tablet by mouth 2 (two) times daily.     Iron 28 MG Tabs  Take 1 tablet by mouth daily.     lisinopril 10 MG tablet  Commonly known as:  PRINIVIL  Take 1 tablet (10 mg total) by mouth daily.     multivitamin tablet  Take 1 tablet by mouth daily.     predniSONE 20 MG tablet  Commonly known as:  DELTASONE  Take 20 mg by mouth daily with breakfast. Dose pack     ranitidine 150 MG tablet  Commonly known as:  ZANTAC  Take 150 mg by mouth daily.     sodium chloride 0.9 % SOLN 50 mL with ertapenem 1 G SOLR 1 g   Inject 1 g into the vein daily.       Allergies  Allergen Reactions  . Penicillins Hives  . Brovana [Arformoterol] Other (See Comments)    "makes me nervous"  . Paxil [Paroxetine Hcl] Rash      The results of significant diagnostics from this hospitalization (including imaging, microbiology, ancillary and laboratory) are listed below for reference.    Significant Diagnostic Studies: Ct Abdomen Pelvis Wo Contrast  02/12/2013   CLINICAL DATA:  Syncope. Lower back pain and nausea. Bacteria on urinalysis. Assess for hydronephrosis.  EXAM: CT ABDOMEN AND PELVIS WITHOUT CONTRAST  TECHNIQUE: Multidetector CT imaging of the abdomen and pelvis was performed following the standard protocol without intravenous contrast.  COMPARISON:  CT of the abdomen and pelvis performed 12/28/2012, and abdominal ultrasound performed 01/06/2013  FINDINGS: The visualized lung bases are clear. Scattered coronary artery calcifications are seen. Postoperative change is noted about the  gastroesophageal junction.  Apparent scarring is noted within the left hepatic lobe. A few calcified granulomata are seen within the liver. The spleen is unremarkable in appearance. The gallbladder is within normal limits. The pancreas and adrenal glands are unremarkable.  There is no evidence of hydronephrosis. Mild nonspecific perinephric stranding is noted bilaterally. There is slight prominence of the proximal left ureter, but this likely remains within normal limits. No renal or ureteral stones are seen.  No free fluid is identified. The small bowel is unremarkable in appearance. The stomach is within normal limits. No acute vascular abnormalities are seen. Scattered calcification is noted along the abdominal aorta and its branches.  The appendix is not definitely seen; there is no evidence for appendicitis. The colon is largely decompressed. Mild diverticulosis is noted along the sigmoid colon, without evidence of diverticulitis.  The  bladder is mildly distended and grossly unremarkable. The patient is status post hysterectomy. No suspicious adnexal masses are seen. The left ovary is unremarkable in appearance. No inguinal lymphadenopathy is seen.  No acute osseous abnormalities are identified.  IMPRESSION: 1. No evidence of hydronephrosis.  No renal or ureteral stones seen. 2. Scattered calcification along the abdominal aorta and its branches. 3. Scattered coronary artery calcifications seen. 4. Scarring within the left hepatic lobe. 5. Mild diverticulosis along the sigmoid colon, without evidence of diverticulitis.   Electronically Signed   By: Roanna Raider [name]   On: 02/12/2013 06:52   Dg Chest 2 View  02/12/2013   CLINICAL DATA:  Chest pain; syncope.  Shortness of breath.  EXAM: CHEST  2 VIEW  COMPARISON:  Chest radiograph performed 01/08/2013  FINDINGS: The lungs are well-aerated. Pulmonary vascularity is at the upper limits of normal. There is no evidence of focal opacification, pleural effusion or pneumothorax.  The heart is normal in size; the mediastinal contour is within normal limits. No acute osseous abnormalities are seen.  IMPRESSION: No acute cardiopulmonary process seen.   Electronically Signed   By: Roanna Raider [name]   On: 02/12/2013 02:46   Dg Lumbar Spine 2-3 Views  02/12/2013   CLINICAL DATA:  Status post fall; lower back pain.  EXAM: LUMBAR SPINE - 2-3 VIEW  COMPARISON:  CT of the abdomen and pelvis performed 01/03/2013  FINDINGS: There is no evidence of fracture or subluxation. Vertebral bodies demonstrate normal height and alignment. Intervertebral disc spaces are preserved. The visualized neural foramina are grossly unremarkable in appearance.  The visualized bowel gas pattern is unremarkable in appearance; air and stool are noted within the colon. The sacroiliac joints are within normal limits. Diffuse calcification is noted along the abdominal aorta.  IMPRESSION: No evidence of fracture or subluxation along  the lumbar spine.   Electronically Signed   By: Roanna Raider [name]   On: 02/12/2013 02:45   Ct Head Wo Contrast  02/12/2013   CLINICAL DATA:  Syncope.  EXAM: CT HEAD WITHOUT CONTRAST  TECHNIQUE: Contiguous axial images were obtained from the base of the skull through the vertex without intravenous contrast.  COMPARISON:  CT of the head performed 07/01/2003  FINDINGS: There is no evidence of acute infarction, mass lesion, or intra- or extra-axial hemorrhage on CT.  Prominence of the ventricles and sulci suggests mild cortical volume loss. Mild periventricular white matter change likely reflects small vessel ischemic microangiopathy.  The brainstem and fourth ventricle are within normal limits. The basal ganglia are unremarkable in appearance. The cerebral hemispheres demonstrate grossly normal gray-white differentiation. No mass effect or midline  shift is seen.  There is no evidence of fracture; visualized osseous structures are unremarkable in appearance. The visualized portions of the orbits are within normal limits. The paranasal sinuses and mastoid air cells are well-aerated. No significant soft tissue abnormalities are seen.  IMPRESSION: 1. No acute intracranial pathology seen on CT. 2. Mild cortical volume loss and minimal small vessel ischemic microangiopathy.   Electronically Signed   By: Roanna Raider [name]   On: 02/12/2013 02:31    Microbiology: Recent Results (from the past 240 hour(s))  URINE CULTURE     Status: None   Collection Time    02/12/13  3:50 AM      Result Value Range Status   Specimen Description URINE, CLEAN CATCH   Final   Special Requests NONE   Final   Culture  Setup Time     Final   Value: 02/12/2013 08:49     Performed at [facility] Count     Final   Value: >=100,000 COLONIES/ML     Performed at [facility]   Culture     Final   Value: ESCHERICHIA COLI     Note: Confirmed Extended Spectrum Beta-Lactamase Producer (ESBL) CRITICAL RESULT  CALLED TO, READ BACK BY AND VERIFIED WITH: CSAR AT 10:35 ON 02/14/2013 BY HAJAM     Performed at [facility]   Report Status 02/14/2013 FINAL   Final   Organism ID, Bacteria ESCHERICHIA COLI   Final  CULTURE, BLOOD (ROUTINE X 2)     Status: None   Collection Time    02/12/13  8:22 AM      Result Value Range Status   Specimen Description BLOOD RIGHT ANTECUBITAL   Final   Special Requests BOTTLES DRAWN AEROBIC AND ANAEROBIC 10CC   Final   Culture  Setup Time     Final   Value: 02/12/2013 14:26     Performed at [facility]   Culture     Final   Value:        BLOOD CULTURE RECEIVED NO GROWTH TO DATE CULTURE WILL BE HELD FOR 5 DAYS BEFORE ISSUING A FINAL NEGATIVE REPORT     Performed at [facility]   Report Status PENDING   Incomplete  CULTURE, BLOOD (ROUTINE X 2)     Status: None   Collection Time    02/12/13  8:30 AM      Result Value Range Status   Specimen Description BLOOD HAND RIGHT   Final   Special Requests BOTTLES DRAWN AEROBIC ONLY 10CC   Final   Culture  Setup Time     Final   Value: 02/12/2013 14:26     Performed at [facility]   Culture     Final   Value: ESCHERICHIA COLI     Note: Confirmed Extended Spectrum Beta-Lactamase Producer (ESBL) CRITICAL RESULT CALLED TO, READ BACK BY AND VERIFIED WITH: JESSICA MILFORD 02/15/13 0755 BY SMITHERSJ     9147 Note: Gram Stain Report Called to,Read Back By and Verified With: TANIA MELTON 02/13/13 [facility]     Performed at [facility]   Report Status 02/15/2013 FINAL   Final   Organism ID, Bacteria ESCHERICHIA COLI   Final  CULTURE, BLOOD (ROUTINE X 2)     Status: None   Collection Time    02/14/13  7:15 AM      Result Value Range Status   Specimen Description BLOOD LEFT ARM   Final  Special Requests BOTTLES DRAWN AEROBIC AND ANAEROBIC 10CC EACH   Final   Culture  Setup Time     Final   Value: 02/14/2013 19:42     Performed at [facility]   Culture     Final   Value:         BLOOD CULTURE RECEIVED NO GROWTH TO DATE CULTURE WILL BE HELD FOR 5 DAYS BEFORE ISSUING A FINAL NEGATIVE REPORT     Performed at [facility]   Report Status PENDING   Incomplete  CULTURE, BLOOD (ROUTINE X 2)     Status: None   Collection Time    02/14/13  7:25 AM      Result Value Range Status   Specimen Description BLOOD LEFT HAND   Final   Special Requests BOTTLES DRAWN AEROBIC ONLY 10CC   Final   Culture  Setup Time     Final   Value: 02/14/2013 19:43     Performed at [facility]   Culture     Final   Value:        BLOOD CULTURE RECEIVED NO GROWTH TO DATE CULTURE WILL BE HELD FOR 5 DAYS BEFORE ISSUING A FINAL NEGATIVE REPORT     Performed at [facility]   Report Status PENDING   Incomplete     Labs: Basic Metabolic Panel:  Recent Labs Lab 02/12/13 0209 02/12/13 0225 02/12/13 0822 02/13/13 0420 02/14/13 0715 02/15/13 0705  NA 134* 135 135 134* 139 140  K 4.3 4.3 4.3 4.1 3.9 3.9  CL 98 100 101 103 109 108  CO2 25  --  24 22 22 23   GLUCOSE 129* 128* 105* 101* 84 84  BUN 26* 25* 24* 16 16 14   CREATININE 1.29* 1.40* 1.12* 1.02 0.94 0.97  CALCIUM 9.3  --  8.6 8.6 8.5 8.8   Liver Function Tests:  Recent Labs Lab 02/12/13 0822  AST 12  ALT 9  ALKPHOS 65  BILITOT 2.0*  PROT 6.3  ALBUMIN 2.7*   No results found for this basename: LIPASE, AMYLASE,  in the last 168 hours No results found for this basename: AMMONIA,  in the last 168 hours CBC:  Recent Labs Lab 02/12/13 0209 02/12/13 0224 02/12/13 0225 02/12/13 0822 02/13/13 0420 02/14/13 0715 02/15/13 0705  WBC 7.4  --   --  5.3 4.4 3.3* 3.3*  NEUTROABS  --  6.2  --  4.3  --   --   --   HGB 9.1*  --  9.2* 7.7* 8.8* 8.0* 7.8*  HCT 27.3*  --  27.0* 23.2* 26.5* 23.1* 22.1*  MCV 101.5*  --   --  101.8* 97.1 95.9 95.7  PLT 221  --   --  197 190 161 172   Cardiac Enzymes:  Recent Labs Lab 02/12/13 0822  CKTOTAL 77  TROPONINI <0.30   BNP: BNP (last 3 results) No results found  for this basename: PROBNP,  in the last 8760 hours CBG:  Recent Labs Lab 02/12/13 0211  GLUCAP 106*       Signed:  Sharay Bellissimo S  [facility] 02/15/2013, 11:49 AM

## 2013-02-15 NOTE — Progress Notes (Signed)
DC orders received.  Patient stable with no S/S of distress.  Medication and discharge information reviewed with patient and patient's husband.  Patient DC home with husband. Drucilla Cumber Marie  

## 2013-02-15 NOTE — Progress Notes (Signed)
Peripherally Inserted Central Catheter/Midline Placement  The IV Nurse has discussed with the patient and/or persons authorized to consent for the patient, the purpose of this procedure and the potential benefits and risks involved with this procedure.  The benefits include less needle sticks, lab draws from the catheter and patient may be discharged home with the catheter.  Risks include, but not limited to, infection, bleeding, blood clot (thrombus formation), and puncture of an artery; nerve damage and irregular heat beat.  Alternatives to this procedure were also discussed.  PICC/Midline Placement Documentation  PICC / Midline Single Lumen 02/15/13 Midline Right Brachial 13 cm 0 cm (Active)       Stacie Glaze Horton 02/15/2013, 1:12 PM

## 2013-02-15 NOTE — Care Management Note (Signed)
    Page 1 of 1   02/15/2013     11:38:57 AM   CARE MANAGEMENT NOTE 02/15/2013  Patient:  Nicole Meyer, Nicole Meyer   Account Number:  [id number]  Date Initiated:  02/15/2013  Documentation initiated by:  GRAVES-BIGELOW,Felicidad Sugarman  Subjective/Objective Assessment:   Pt admitted for syncope and dehydration. Treating for Possible pyelonephritis; plan to continue with Primaxin.     Action/Plan:   Pt has PICC Line for ABX therapy. Plan will be for home with IV ABX. CM did make referral with [facility] and [facility] to begin within 24-48 hours post d/c.   Anticipated DC Date:  02/15/2013   Anticipated DC Plan:  HOME W HOME HEALTH SERVICES      DC Planning Services  CM consult      [facility] Choice  HOME HEALTH   Choice offered to / List presented to:  C-1 Patient        HH arranged  HH-1 RN      [facility] agency  [facility]   Status of service:  Completed, signed off Medicare Important Message given?   (If response is "NO", the following Medicare IM given date fields will be blank) Date Medicare IM given:   Date Additional Medicare IM given:    Discharge Disposition:  HOME W HOME HEALTH SERVICES  Per UR Regulation:  Reviewed for med. necessity/level of care/duration of stay  If discussed at Long Length of Stay Meetings, dates discussed:    Comments:

## 2013-02-15 NOTE — Progress Notes (Signed)
CRITICAL VALUE ALERT  Critical value received:  Blood cultures growing e coli ESBL  Date of notification:  02/15/2013  Time of notification:  07:50  Critical value read back:yes  Nurse who received alert:  Wilfred Curtis   MD notified (1st page):  Dr. Sharl Ma  Time of first page:  07:51  MD notified (2nd page):  Time of second page:  Responding MD:  Dr. Sharl Ma  Time MD responded:  07:51

## 2013-02-16 ENCOUNTER — Encounter: Payer: Self-pay | Admitting: Dietician

## 2013-02-16 NOTE — Progress Notes (Signed)
Nutrition Brief Note  Patient identified on [facility] Nutrition Screen.   Wt Readings from Last 15 Encounters:  02/12/13 148 lb 6.4 oz (67.314 kg)  01/28/13 156 lb 4.8 oz (70.897 kg)  01/04/13 160 lb 7.9 oz (72.8 kg)  07/06/12 167 lb 12.8 oz (76.114 kg)  06/29/12 170 lb 3.2 oz (77.202 kg)  05/27/12 174 lb (78.926 kg)  04/29/12 176 lb (79.833 kg)  04/08/12 185 lb 8 oz (84.142 kg)  04/08/12 185 lb 8 oz (84.142 kg)  04/06/12 185 lb 8 oz (84.142 kg)  03/30/12 185 lb 3.2 oz (84.006 kg)  01/09/12 191 lb (86.637 kg)  01/08/12 193 lb 12.8 oz (87.907 kg)  01/06/12 192 lb (87.091 kg)  01/06/12 192 lb (87.091 kg)   Pt with hx of leukopenia, not currently on treatments. Pt is followed by [facility] for anemia.   Wt hx reveals 22.5% wt loss x 1 year and 11.3% wt loss x 6 months, both of which are clinically significant.   Noted recent d/c from [facility] on 02/15/13 for pyelonephritis. Noted RD assessment on 02/12/13. Pt declined diet education. She does not drink nutrition supplements at home. She attributes weight loss to hernia repair sx.   No further interventions due to recent hospitalization and RD assessment.   Nicole Meyer A. Mayford Knife, RD, LDN Pager: [phone]

## 2013-02-18 LAB — CULTURE, BLOOD (ROUTINE X 2): Culture: NO GROWTH

## 2013-02-20 LAB — CULTURE, BLOOD (ROUTINE X 2)
Culture: NO GROWTH
Culture: NO GROWTH

## 2013-03-08 ENCOUNTER — Other Ambulatory Visit: Payer: Medicare PPO | Admitting: Lab

## 2013-03-08 ENCOUNTER — Ambulatory Visit: Payer: Medicare PPO

## 2013-03-08 ENCOUNTER — Other Ambulatory Visit (HOSPITAL_BASED_OUTPATIENT_CLINIC_OR_DEPARTMENT_OTHER): Payer: Medicare PPO

## 2013-03-08 ENCOUNTER — Ambulatory Visit (HOSPITAL_BASED_OUTPATIENT_CLINIC_OR_DEPARTMENT_OTHER): Payer: Medicare PPO | Admitting: Hematology and Oncology

## 2013-03-08 VITALS — BP 169/73 | HR 85 | Temp 98.2°F | Resp 18 | Ht 63.0 in | Wt 157.1 lb

## 2013-03-08 DIAGNOSIS — I10 Essential (primary) hypertension: Secondary | ICD-10-CM

## 2013-03-08 DIAGNOSIS — D72819 Decreased white blood cell count, unspecified: Secondary | ICD-10-CM

## 2013-03-08 DIAGNOSIS — D649 Anemia, unspecified: Secondary | ICD-10-CM

## 2013-03-08 LAB — IRON AND TIBC CHCC
%SAT: 31 % (ref 21–57)
IRON: 69 ug/dL (ref 41–142)
TIBC: 218 ug/dL — ABNORMAL LOW (ref 236–444)
UIBC: 149 ug/dL (ref 120–384)

## 2013-03-08 LAB — CBC & DIFF AND RETIC
BASO%: 0.5 % (ref 0.0–2.0)
BASOS ABS: 0 10*3/uL (ref 0.0–0.1)
EOS%: 3.6 % (ref 0.0–7.0)
Eosinophils Absolute: 0.1 10*3/uL (ref 0.0–0.5)
HCT: 29 % — ABNORMAL LOW (ref 34.8–46.6)
HGB: 9.3 g/dL — ABNORMAL LOW (ref 11.6–15.9)
Immature Retic Fract: 16.6 % — ABNORMAL HIGH (ref 1.60–10.00)
LYMPH%: 18.8 % (ref 14.0–49.7)
MCH: 32.5 pg (ref 25.1–34.0)
MCHC: 32.1 g/dL (ref 31.5–36.0)
MCV: 101.4 fL — AB (ref 79.5–101.0)
MONO#: 0.3 10*3/uL (ref 0.1–0.9)
MONO%: 8.2 % (ref 0.0–14.0)
NEUT#: 2.7 10*3/uL (ref 1.5–6.5)
NEUT%: 68.9 % (ref 38.4–76.8)
Platelets: 304 10*3/uL (ref 145–400)
RBC: 2.86 10*6/uL — ABNORMAL LOW (ref 3.70–5.45)
RDW: 15.2 % — AB (ref 11.2–14.5)
RETIC %: 6.6 % — AB (ref 0.70–2.10)
Retic Ct Abs: 188.76 10*3/uL — ABNORMAL HIGH (ref 33.70–90.70)
WBC: 3.9 10*3/uL (ref 3.9–10.3)
lymph#: 0.7 10*3/uL — ABNORMAL LOW (ref 0.9–3.3)
nRBC: 0 % (ref 0–0)

## 2013-03-08 LAB — COMPREHENSIVE METABOLIC PANEL (CC13)
ALBUMIN: 3.3 g/dL — AB (ref 3.5–5.0)
ALT: 10 U/L (ref 0–55)
ANION GAP: 10 meq/L (ref 3–11)
AST: 12 U/L (ref 5–34)
Alkaline Phosphatase: 84 U/L (ref 40–150)
BILIRUBIN TOTAL: 1.18 mg/dL (ref 0.20–1.20)
BUN: 9 mg/dL (ref 7.0–26.0)
CO2: 22 meq/L (ref 22–29)
Calcium: 9.6 mg/dL (ref 8.4–10.4)
Chloride: 109 mEq/L (ref 98–109)
Creatinine: 0.8 mg/dL (ref 0.6–1.1)
GLUCOSE: 103 mg/dL (ref 70–140)
Potassium: 3.6 mEq/L (ref 3.5–5.1)
Sodium: 140 mEq/L (ref 136–145)
Total Protein: 7.1 g/dL (ref 6.4–8.3)

## 2013-03-08 LAB — MORPHOLOGY: PLT EST: ADEQUATE

## 2013-03-08 LAB — BILIRUBIN, DIRECT: Bilirubin, Direct: 0.2 mg/dL (ref 0.0–0.3)

## 2013-03-08 LAB — FERRITIN CHCC: Ferritin: 1290 ng/ml — ABNORMAL HIGH (ref 9–269)

## 2013-03-08 NOTE — Patient Instructions (Signed)
Peripherally Inserted Central Catheter (PICC) Removal and Care After A peripherally inserted catheter (PICC) is removed when it is no longer needed, when it is clotted, or when it may be infected.  PROCEDURE  The removal of a PICC is usually painless. Removing the tape that holds the PICC in place may be the most discomfort you have.  A physicians order needs to be obtained to have the PICC removed.  A PICC can be removed in the hospital or in an outpatient setting.  Never remove or take out the PICC yourself. Only a trained clinical professional, such as a PICC nurse, should remove the PICC.  If a PICC is suspected to be infected, the PICC tip is sent to the lab for culture. HOME CARE INSTRUCTIONS  When the PICC is out, pressure is applied at the insertion site to prevent bleeding. An antibiotic ointment may be applied to the insertion site. A dry, sterile gauze is then taped over the insertion site. This dressing should stay on for 24 hours.  After the 24 hours is up, the dressing may be removed. The PICC insertion site is very small. A small scab may develop over the insertion site. It is okay to wash the site gently with soap and water. Be careful to not remove or pick the scab off. After washing, gently pat the site dry. You do not need to put another dressing over the insertion site after you wash it.  Avoid heavy, strenuous physical activity for 24 hours after the PICC is removed. This includes things like:  Weight lifting.  Strenuous yard work.  Any physical activity with repetitive arm movement. SEEK MEDICAL CARE IF:  Call or see your caregiver as soon as possible if you develop the following conditions in the arm in which the PICC was inserted:  Swelling or puffiness.  Increasing tenderness or pain. SEEK IMMEDIATE MEDICAL CARE IF:  You develop any of the following conditions in the arm that had the PICC:  Numbness or tingling in your fingers, hand, or arm.  You arm has  a bluish color and it is cold to the touch.  Redness around the insertion site or a red-streak that goes up your arm.  Any type of drainage from the PICC insertion site. This includes drainage such as:  Bleeding from the insertion site. (If this happens, apply firm, direct pressure to the PICC insertion site with a clean towel.)  Drainage that is yellow or tan in color.  You have an oral temperature above 102 F (38.9 C), not controlled by medicine. Document Released: 07/25/2009 Document Revised: 04/29/2011 Document Reviewed: 07/25/2009 [employer] Patient Information 2014 [employer].  

## 2013-03-08 NOTE — Progress Notes (Signed)
1535: PICC line removed without difficulty. Pt tolerated well. Pt placed flat in chair, petrolatum dressing and sterile 4x4s placed on top of arm as line pulled, pressure dressing applied.

## 2013-03-08 NOTE — Progress Notes (Signed)
[facility] OFFICE PROGRESS NOTE  Nicole Huh, MD DIAGNOSIS:  Severe anemia  SUMMARY OF HEMATOLOGIC HISTORY: This is a pleasant 73 year old lady with background history of severe anemia. In July of 2013 she had bone marrow aspirate and biopsy that came back hypercellular but unremarkable. She was recommended to take iron supplements. Between November to December 2014, she had multiple hospitalization requiring blood transfusion. INTERVAL HISTORY: Nicole Meyer 73 y.o. female returns for followup. Since I saw her, she was readmitted to the hospital for syncopal episode and was transfused with blood. Her PICC line was placed prior to dismissal. The patient denies any recent signs or symptoms of bleeding such as spontaneous epistaxis, hematuria or hematochezia. She denies any shortness of breath, chest pain or dizziness.  I have reviewed the past medical history, past surgical history, social history and family history with the patient and they are unchanged from previous note.  ALLERGIES:  is allergic to penicillins; brovana; and paxil.  MEDICATIONS:  Current Outpatient Prescriptions  Medication Sig Dispense Refill  . albuterol (PROVENTIL) (2.5 MG/3ML) 0.083% nebulizer solution Take 2.5 mg by nebulization every 6 (six) hours as needed for wheezing or shortness of breath. Wheezing and shortness of breath      . aspirin EC 81 MG tablet Take 81 mg by mouth daily.      . beta carotene w/minerals (OCUVITE) tablet Take 1 tablet by mouth daily.      . calcium-vitamin D (OSCAL WITH D) 500-200 MG-UNIT per tablet Take 1 tablet by mouth 2 (two) times daily.      . Ferrous Sulfate (IRON) 28 MG TABS Take 1 tablet by mouth daily.      [name] lisinopril (PRINIVIL) 10 MG tablet Take 1 tablet (10 mg total) by mouth daily.  30 tablet  2  . Multiple Vitamin (MULTIVITAMIN) tablet Take 1 tablet by mouth daily.      . ranitidine (ZANTAC) 150 MG tablet Take 150 mg by mouth daily.       No  current facility-administered medications for this visit.     REVIEW OF SYSTEMS:   Constitutional: Denies fevers, chills or night sweats Eyes: Denies blurriness of vision Ears, nose, mouth, throat, and face: Denies mucositis or sore throat Respiratory: Denies cough, dyspnea or wheezes Cardiovascular: Denies palpitation, chest discomfort. She does the chronic bilateral lower extremity swelling Gastrointestinal:  Denies nausea, heartburn or change in bowel habits Skin: Denies abnormal skin rashes Lymphatics: Denies new lymphadenopathy or easy bruising Neurological:Denies numbness, tingling or new weaknesses Behavioral/Psych: Mood is stable, no new changes  All other systems were reviewed with the patient and are negative.  PHYSICAL EXAMINATION: ECOG PERFORMANCE STATUS: 1 - Symptomatic but completely ambulatory  Filed Vitals:   03/08/13 1440  BP: 169/73  Pulse: 85  Temp: 98.2 F (36.8 C)  Resp: 18   Filed Weights   03/08/13 1440  Weight: 157 lb 1.6 oz (71.26 kg)    GENERAL:alert, no distress and comfortable SKIN: skin color, texture, turgor are normal, no rashes or significant lesions EYES: normal, Conjunctiva are pale and non-injected, sclera clear OROPHARYNX:no exudate, no erythema and lips, buccal mucosa, and tongue normal  NECK: supple, thyroid normal size, non-tender, without nodularity LYMPH:  no palpable lymphadenopathy in the cervical, axillary or inguinal LUNGS: clear to auscultation and percussion with normal breathing effort HEART: regular rate & rhythm and no murmurs with moderate bilateral lower extremity edema ABDOMEN:abdomen soft, non-tender and normal bowel sounds Musculoskeletal:no cyanosis of digits and no clubbing  NEURO: alert & oriented x 3 with fluent speech, no focal motor/sensory deficits  LABORATORY DATA:  I have reviewed the data as listed Results for orders placed in visit on 03/08/13 (from the past 48 hour(s))  CBC & DIFF AND RETIC     Status:  Abnormal   Collection Time    03/08/13  2:20 PM      Result Value Range   WBC 3.9  3.9 - 10.3 10e3/uL   NEUT# 2.7  1.5 - 6.5 10e3/uL   HGB 9.3 (*) 11.6 - 15.9 g/dL   HCT 29.0 (*) 34.8 - 46.6 %   Platelets 304  145 - 400 10e3/uL   MCV 101.4 (*) 79.5 - 101.0 fL   MCH 32.5  25.1 - 34.0 pg   MCHC 32.1  31.5 - 36.0 g/dL   RBC 2.86 (*) 3.70 - 5.45 10e6/uL   RDW 15.2 (*) 11.2 - 14.5 %   lymph# 0.7 (*) 0.9 - 3.3 10e3/uL   MONO# 0.3  0.1 - 0.9 10e3/uL   Eosinophils Absolute 0.1  0.0 - 0.5 10e3/uL   Basophils Absolute 0.0  0.0 - 0.1 10e3/uL   NEUT% 68.9  38.4 - 76.8 %   LYMPH% 18.8  14.0 - 49.7 %   MONO% 8.2  0.0 - 14.0 %   EOS% 3.6  0.0 - 7.0 %   BASO% 0.5  0.0 - 2.0 %   nRBC 0  0 - 0 %   Retic % 6.60 (*) 0.70 - 2.10 %   Retic Ct Abs 188.76 (*) 33.70 - 90.70 10e3/uL   Immature Retic Fract 16.60 (*) 1.60 - 10.00 %  MORPHOLOGY     Status: None   Collection Time    03/08/13  2:20 PM      Result Value Range   Polychromasia Moderate  Slight   White Cell Comments C/W auto diff     PLT EST Adequate  Adequate  COMPREHENSIVE METABOLIC PANEL (IW58)     Status: Abnormal   Collection Time    03/08/13  2:20 PM      Result Value Range   Sodium 140  136 - 145 mEq/L   Potassium 3.6  3.5 - 5.1 mEq/L   Chloride 109  98 - 109 mEq/L   CO2 22  22 - 29 mEq/L   Glucose 103  70 - 140 mg/dl   BUN 9.0  7.0 - 26.0 mg/dL   Creatinine 0.8  0.6 - 1.1 mg/dL   Total Bilirubin 1.18  0.20 - 1.20 mg/dL   Alkaline Phosphatase 84  40 - 150 U/L   AST 12  5 - 34 U/L   ALT 10  0 - 55 U/L   Total Protein 7.1  6.4 - 8.3 g/dL   Albumin 3.3 (*) 3.5 - 5.0 g/dL   Calcium 9.6  8.4 - 10.4 mg/dL   Anion Gap 10  3 - 11 mEq/L    Lab Results  Component Value Date   WBC 3.9 03/08/2013   HGB 9.3* 03/08/2013   HCT 29.0* 03/08/2013   MCV 101.4* 03/08/2013   PLT 304 03/08/2013   I reviewed her peripheral smear today. 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 At that time, there were no evidence of high-grade dysplasia. 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 I will bring her back in 2 weeks for further workup. I will present her case at the next hematology meeting to review the bone marrow biopsy At present time, she is not symptomatic. She does require blood transfusion #2 mild leukopenia She is not symptomatic. We will observe #3 iron overload Repeat ferritin is pending. We will observe #4 severe hypertension I recommend she follow with her primary care provider for blood pressure adjustment #5 venous access Due to your history of sepsis, I will remove the PICC line as it is not needed but now. All questions were answered. The patient knows to call the clinic with any problems, questions or concerns. No barriers to learning was detected.  I spent 25 minutes counseling the patient face to face. The total time spent in the appointment was 40 minutes and more than 50% was on counseling.     [facility], [place], MD 03/08/2013 3:29 PM

## 2013-03-09 ENCOUNTER — Ambulatory Visit: Payer: Medicare PPO

## 2013-03-22 ENCOUNTER — Other Ambulatory Visit (HOSPITAL_BASED_OUTPATIENT_CLINIC_OR_DEPARTMENT_OTHER): Payer: Medicare PPO

## 2013-03-22 DIAGNOSIS — D649 Anemia, unspecified: Secondary | ICD-10-CM

## 2013-03-22 LAB — CBC & DIFF AND RETIC
BASO%: 0.2 % (ref 0.0–2.0)
Basophils Absolute: 0 10*3/uL (ref 0.0–0.1)
EOS ABS: 0.1 10*3/uL (ref 0.0–0.5)
EOS%: 1.2 % (ref 0.0–7.0)
HCT: 26.6 % — ABNORMAL LOW (ref 34.8–46.6)
HEMOGLOBIN: 8.6 g/dL — AB (ref 11.6–15.9)
Immature Retic Fract: 20.3 % — ABNORMAL HIGH (ref 1.60–10.00)
LYMPH#: 0.6 10*3/uL — AB (ref 0.9–3.3)
LYMPH%: 8.7 % — ABNORMAL LOW (ref 14.0–49.7)
MCH: 32.1 pg (ref 25.1–34.0)
MCHC: 32.3 g/dL (ref 31.5–36.0)
MCV: 99.3 fL (ref 79.5–101.0)
MONO#: 0.5 10*3/uL (ref 0.1–0.9)
MONO%: 6.9 % (ref 0.0–14.0)
NEUT%: 83 % — ABNORMAL HIGH (ref 38.4–76.8)
NEUTROS ABS: 5.4 10*3/uL (ref 1.5–6.5)
Platelets: 319 10*3/uL (ref 145–400)
RBC: 2.68 10*6/uL — ABNORMAL LOW (ref 3.70–5.45)
RDW: 14.7 % — ABNORMAL HIGH (ref 11.2–14.5)
RETIC %: 8.98 % — AB (ref 0.70–2.10)
RETIC CT ABS: 240.66 10*3/uL — AB (ref 33.70–90.70)
WBC: 6.5 10*3/uL (ref 3.9–10.3)
nRBC: 0 % (ref 0–0)

## 2013-03-22 LAB — HOLD TUBE, BLOOD BANK

## 2013-03-22 LAB — LACTATE DEHYDROGENASE (CC13): LDH: 169 U/L (ref 125–245)

## 2013-03-23 LAB — HAPTOGLOBIN: HAPTOGLOBIN: 203 mg/dL (ref 45–215)

## 2013-03-23 LAB — DIRECT ANTIGLOBULIN TEST (NOT AT ARMC)
DAT (Complement): NEGATIVE
DAT IgG: NEGATIVE

## 2013-04-05 ENCOUNTER — Other Ambulatory Visit: Payer: Self-pay | Admitting: Hematology and Oncology

## 2013-04-05 ENCOUNTER — Ambulatory Visit (HOSPITAL_BASED_OUTPATIENT_CLINIC_OR_DEPARTMENT_OTHER): Payer: Medicare PPO | Admitting: Hematology and Oncology

## 2013-04-05 ENCOUNTER — Telehealth: Payer: Self-pay | Admitting: Hematology and Oncology

## 2013-04-05 ENCOUNTER — Encounter: Payer: Self-pay | Admitting: Hematology and Oncology

## 2013-04-05 ENCOUNTER — Other Ambulatory Visit (HOSPITAL_BASED_OUTPATIENT_CLINIC_OR_DEPARTMENT_OTHER): Payer: Medicare PPO

## 2013-04-05 VITALS — BP 156/74 | HR 71 | Temp 97.8°F | Resp 18 | Ht 63.0 in | Wt 152.5 lb

## 2013-04-05 DIAGNOSIS — D72819 Decreased white blood cell count, unspecified: Secondary | ICD-10-CM

## 2013-04-05 DIAGNOSIS — M109 Gout, unspecified: Secondary | ICD-10-CM

## 2013-04-05 DIAGNOSIS — D649 Anemia, unspecified: Secondary | ICD-10-CM

## 2013-04-05 HISTORY — DX: Gout, unspecified: M10.9

## 2013-04-05 LAB — LACTATE DEHYDROGENASE (CC13): LDH: 185 U/L (ref 125–245)

## 2013-04-05 LAB — CBC & DIFF AND RETIC
BASO%: 0.3 % (ref 0.0–2.0)
Basophils Absolute: 0 10*3/uL (ref 0.0–0.1)
EOS%: 5.7 % (ref 0.0–7.0)
Eosinophils Absolute: 0.2 10*3/uL (ref 0.0–0.5)
HEMATOCRIT: 32.7 % — AB (ref 34.8–46.6)
HGB: 10.5 g/dL — ABNORMAL LOW (ref 11.6–15.9)
IMMATURE RETIC FRACT: 6.7 % (ref 1.60–10.00)
LYMPH%: 19.3 % (ref 14.0–49.7)
MCH: 31.9 pg (ref 25.1–34.0)
MCHC: 32.1 g/dL (ref 31.5–36.0)
MCV: 99.4 fL (ref 79.5–101.0)
MONO#: 0.3 10*3/uL (ref 0.1–0.9)
MONO%: 7.1 % (ref 0.0–14.0)
NEUT#: 2.4 10*3/uL (ref 1.5–6.5)
NEUT%: 67.6 % (ref 38.4–76.8)
PLATELETS: 199 10*3/uL (ref 145–400)
RBC: 3.29 10*6/uL — ABNORMAL LOW (ref 3.70–5.45)
RDW: 13.8 % (ref 11.2–14.5)
Retic %: 3.88 % — ABNORMAL HIGH (ref 0.70–2.10)
Retic Ct Abs: 127.65 10*3/uL — ABNORMAL HIGH (ref 33.70–90.70)
WBC: 3.5 10*3/uL — ABNORMAL LOW (ref 3.9–10.3)
lymph#: 0.7 10*3/uL — ABNORMAL LOW (ref 0.9–3.3)
nRBC: 0 % (ref 0–0)

## 2013-04-05 LAB — BASIC METABOLIC PANEL (CC13)
Anion Gap: 9 mEq/L (ref 3–11)
BUN: 15.1 mg/dL (ref 7.0–26.0)
CALCIUM: 9.7 mg/dL (ref 8.4–10.4)
CO2: 27 mEq/L (ref 22–29)
CREATININE: 0.9 mg/dL (ref 0.6–1.1)
Chloride: 106 mEq/L (ref 98–109)
Glucose: 79 mg/dl (ref 70–140)
Potassium: 3.7 mEq/L (ref 3.5–5.1)
Sodium: 142 mEq/L (ref 136–145)

## 2013-04-05 LAB — HOLD TUBE, BLOOD BANK

## 2013-04-05 MED ORDER — PREDNISONE 20 MG PO TABS: 20.0000 mg | ORAL_TABLET | Freq: Every day | ORAL | Status: DC

## 2013-04-05 NOTE — Telephone Encounter (Signed)
gv and printed appt sched anda vs for pt for March.... °

## 2013-04-05 NOTE — Progress Notes (Signed)
[place] OFFICE PROGRESS NOTE  [name], MD DIAGNOSIS:  Severe anemia, anemia chronic disease  SUMMARY OF HEMATOLOGIC HISTORY: This is a pleasant 73 year old lady with background history of severe anemia. In July of 2013 she had bone marrow aspirate and biopsy that came back hypercellular but unremarkable. She was recommended to take iron supplements. Between November to December 2014, she had multiple hospitalization requiring blood transfusion. INTERVAL HISTORY: Nicole Meyer 73 y.o. female returns for further followup. She complained of feeling tired. Recently she went back to oral iron supplement. 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 She was also placed on antibiotics treatment but was not taking it. The patient denies any recent signs or symptoms of bleeding such as spontaneous epistaxis, hematuria or hematochezia.  I have reviewed the past medical history, past surgical history, social history and family history with the patient and they are unchanged from previous note.  ALLERGIES:  is allergic to penicillins; brovana; and paxil.  MEDICATIONS:  Current Outpatient Prescriptions  Medication Sig Dispense Refill  . albuterol (PROVENTIL) (2.5 MG/3ML) 0.083% nebulizer solution Take 2.5 mg by nebulization every 6 (six) hours as needed for wheezing or shortness of breath. Wheezing and shortness of breath      . aspirin EC 81 MG tablet Take 81 mg by mouth daily.      . beta carotene w/minerals (OCUVITE) tablet Take 1 tablet by mouth daily.      . calcium-vitamin D (OSCAL WITH D) 500-200 MG-UNIT per tablet Take 1 tablet by mouth 2 (two) times daily.      . Ferrous Sulfate (IRON) 28 MG TABS Take 1 tablet by mouth daily.      [name] lisinopril (PRINIVIL) 10 MG tablet Take 1 tablet (10 mg total) by mouth daily.  30 tablet  2  . Multiple Vitamin (MULTIVITAMIN) tablet Take 1 tablet by mouth daily.      .  ranitidine (ZANTAC) 150 MG tablet Take 150 mg by mouth daily.      [name] HYDROcodone-acetaminophen (NORCO/VICODIN) 5-325 MG per tablet       . predniSONE (DELTASONE) 20 MG tablet Take 1 tablet (20 mg total) by mouth daily with breakfast.  7 tablet  0   No current facility-administered medications for this visit.     REVIEW OF SYSTEMS:   Constitutional: Denies fevers, chills or night sweats Eyes: Denies blurriness of vision Ears, nose, mouth, throat, and face: Denies mucositis or sore throat Respiratory: Denies cough, dyspnea or wheezes Cardiovascular: Denies palpitation, chest discomfort or lower extremity swelling Gastrointestinal:  Denies nausea, heartburn or change in bowel habits Skin: Denies abnormal skin rashes Lymphatics: Denies new lymphadenopathy or easy bruising Neurological:Denies numbness, tingling or new weaknesses Behavioral/Psych: Mood is stable, no new changes  All other systems were reviewed with the patient and are negative.  PHYSICAL EXAMINATION: ECOG PERFORMANCE STATUS: 1 - Symptomatic but completely ambulatory  Filed Vitals:   04/05/13 1054  BP: 156/74  Pulse: 71  Temp: 97.8 F (36.6 C)  Resp: 18   Filed Weights   04/05/13 1054  Weight: 152 lb 8 oz (69.174 kg)    GENERAL:alert, no distress and comfortable SKIN: skin color, texture, turgor are normal, no rashes or significant lesions EYES: normal, Conjunctiva are pink and non-injected, sclera clear OROPHARYNX:no exudate, no erythema and lips, buccal mucosa, and tongue normal  NECK: supple, thyroid normal size, non-tender, without nodularity LYMPH:  no palpable lymphadenopathy in the cervical, axillary  or inguinal LUNGS: clear to auscultation and percussion with normal breathing effort HEART: regular rate & rhythm and no murmurs and no lower extremity edema ABDOMEN:abdomen soft, non-tender and normal bowel sounds Musculoskeletal:no cyanosis of digits and no clubbing . She has acute gout attack affecting her  left middle finger NEURO: alert & oriented x 3 with fluent speech, no focal motor/sensory deficits  LABORATORY DATA:  I have reviewed the data as listed Results for orders placed in visit on 04/05/13 (from the past 48 hour(s))  HOLD TUBE, BLOOD BANK     Status: None   Collection Time    04/05/13 10:24 AM      Result Value Ref Range   Hold Tube, Blood Bank Blood Bank Order Cancelled    CBC & DIFF AND RETIC     Status: Abnormal   Collection Time    04/05/13 10:24 AM      Result Value Ref Range   WBC 3.5 (*) 3.9 - 10.3 10e3/uL   NEUT# 2.4  1.5 - 6.5 10e3/uL   HGB 10.5 (*) 11.6 - 15.9 g/dL   HCT 32.7 (*) 34.8 - 46.6 %   Platelets 199  145 - 400 10e3/uL   MCV 99.4  79.5 - 101.0 fL   MCH 31.9  25.1 - 34.0 pg   MCHC 32.1  31.5 - 36.0 g/dL   RBC 3.29 (*) 3.70 - 5.45 10e6/uL   RDW 13.8  11.2 - 14.5 %   lymph# 0.7 (*) 0.9 - 3.3 10e3/uL   MONO# 0.3  0.1 - 0.9 10e3/uL   Eosinophils Absolute 0.2  0.0 - 0.5 10e3/uL   Basophils Absolute 0.0  0.0 - 0.1 10e3/uL   NEUT% 67.6  38.4 - 76.8 %   LYMPH% 19.3  14.0 - 49.7 %   MONO% 7.1  0.0 - 14.0 %   EOS% 5.7  0.0 - 7.0 %   BASO% 0.3  0.0 - 2.0 %   nRBC 0  0 - 0 %   Retic % 3.88 (*) 0.70 - 2.10 %   Retic Ct Abs 127.65 (*) 33.70 - 90.70 10e3/uL   Immature Retic Fract 6.70  1.60 - 94.49 %  BASIC METABOLIC PANEL (QP59)     Status: None   Collection Time    04/05/13 10:25 AM      Result Value Ref Range   Sodium 142  136 - 145 mEq/L   Potassium 3.7  3.5 - 5.1 mEq/L   Chloride 106  98 - 109 mEq/L   CO2 27  22 - 29 mEq/L   Glucose 79  70 - 140 mg/dl   BUN 15.1  7.0 - 26.0 mg/dL   Creatinine 0.9  0.6 - 1.1 mg/dL   Calcium 9.7  8.4 - 10.4 mg/dL   Anion Gap 9  3 - 11 mEq/L    Lab Results  Component Value Date   WBC 3.5* 04/05/2013   HGB 10.5* 04/05/2013   HCT 32.7* 04/05/2013   MCV 99.4 04/05/2013   PLT 199 04/05/2013   ASSESSMENT & PLAN:  #1 leukopenia Causes unknown. She had bone marrow biopsy which was reviewed at the Hematology  conference recently and it was unremarkable. I recommend observation for now #2 recurrent acute gout attack I give her another course of prednisone to take 20 mg daily for one week. If it does not resolve, I recommend she call her primary care provider for further management #3 anemia This has improved since I last saw her.  I suspect she may have anemia chronic disease. I recommended she discontinue iron supplement because of most recent ferritin suggest that she have evidence of iron overload. All questions were answered. The patient knows to call the clinic with any problems, questions or concerns. No barriers to learning was detected.  I spent 15 minutes counseling the patient face to face. The total time spent in the appointment was 20 minutes and more than 50% was on counseling.     [place], [place], MD 04/05/2013 11:38 AM

## 2013-04-30 ENCOUNTER — Other Ambulatory Visit: Payer: Self-pay | Admitting: Hematology and Oncology

## 2013-04-30 DIAGNOSIS — D649 Anemia, unspecified: Secondary | ICD-10-CM

## 2013-05-03 ENCOUNTER — Telehealth: Payer: Self-pay | Admitting: Hematology and Oncology

## 2013-05-03 ENCOUNTER — Other Ambulatory Visit (HOSPITAL_BASED_OUTPATIENT_CLINIC_OR_DEPARTMENT_OTHER): Payer: Medicare PPO

## 2013-05-03 ENCOUNTER — Ambulatory Visit (HOSPITAL_BASED_OUTPATIENT_CLINIC_OR_DEPARTMENT_OTHER): Payer: Medicare PPO | Admitting: Hematology and Oncology

## 2013-05-03 ENCOUNTER — Other Ambulatory Visit: Payer: Self-pay | Admitting: Hematology and Oncology

## 2013-05-03 ENCOUNTER — Encounter: Payer: Self-pay | Admitting: Hematology and Oncology

## 2013-05-03 VITALS — BP 169/74 | HR 86 | Temp 97.8°F | Resp 18 | Wt 158.0 lb

## 2013-05-03 DIAGNOSIS — D649 Anemia, unspecified: Secondary | ICD-10-CM

## 2013-05-03 DIAGNOSIS — R17 Unspecified jaundice: Secondary | ICD-10-CM | POA: Insufficient documentation

## 2013-05-03 DIAGNOSIS — R7989 Other specified abnormal findings of blood chemistry: Secondary | ICD-10-CM

## 2013-05-03 DIAGNOSIS — D599 Acquired hemolytic anemia, unspecified: Secondary | ICD-10-CM

## 2013-05-03 DIAGNOSIS — M109 Gout, unspecified: Secondary | ICD-10-CM

## 2013-05-03 DIAGNOSIS — D72819 Decreased white blood cell count, unspecified: Secondary | ICD-10-CM

## 2013-05-03 DIAGNOSIS — R5381 Other malaise: Secondary | ICD-10-CM

## 2013-05-03 DIAGNOSIS — R5383 Other fatigue: Secondary | ICD-10-CM

## 2013-05-03 HISTORY — DX: Other disorders of bilirubin metabolism: E80.6

## 2013-05-03 LAB — COMPREHENSIVE METABOLIC PANEL (CC13)
ALBUMIN: 3.2 g/dL — AB (ref 3.5–5.0)
ALT: 13 U/L (ref 0–55)
AST: 14 U/L (ref 5–34)
Alkaline Phosphatase: 64 U/L (ref 40–150)
Anion Gap: 9 mEq/L (ref 3–11)
BUN: 13.5 mg/dL (ref 7.0–26.0)
CO2: 25 mEq/L (ref 22–29)
Calcium: 9.4 mg/dL (ref 8.4–10.4)
Chloride: 107 mEq/L (ref 98–109)
Creatinine: 0.9 mg/dL (ref 0.6–1.1)
GLUCOSE: 119 mg/dL (ref 70–140)
Potassium: 3.6 mEq/L (ref 3.5–5.1)
Sodium: 141 mEq/L (ref 136–145)
Total Bilirubin: 2.02 mg/dL — ABNORMAL HIGH (ref 0.20–1.20)
Total Protein: 6.8 g/dL (ref 6.4–8.3)

## 2013-05-03 LAB — CBC & DIFF AND RETIC
BASO%: 0.2 % (ref 0.0–2.0)
Basophils Absolute: 0 10*3/uL (ref 0.0–0.1)
EOS%: 4.5 % (ref 0.0–7.0)
Eosinophils Absolute: 0.2 10*3/uL (ref 0.0–0.5)
HCT: 28.7 % — ABNORMAL LOW (ref 34.8–46.6)
HGB: 9.4 g/dL — ABNORMAL LOW (ref 11.6–15.9)
Immature Retic Fract: 12.5 % — ABNORMAL HIGH (ref 1.60–10.00)
LYMPH%: 9.9 % — AB (ref 14.0–49.7)
MCH: 32 pg (ref 25.1–34.0)
MCHC: 32.8 g/dL (ref 31.5–36.0)
MCV: 97.6 fL (ref 79.5–101.0)
MONO#: 0.5 10*3/uL (ref 0.1–0.9)
MONO%: 8.9 % (ref 0.0–14.0)
NEUT#: 3.9 10*3/uL (ref 1.5–6.5)
NEUT%: 76.5 % (ref 38.4–76.8)
PLATELETS: 216 10*3/uL (ref 145–400)
RBC: 2.94 10*6/uL — AB (ref 3.70–5.45)
RDW: 13.7 % (ref 11.2–14.5)
RETIC %: 4.43 % — AB (ref 0.70–2.10)
Retic Ct Abs: 130.24 10*3/uL — ABNORMAL HIGH (ref 33.70–90.70)
WBC: 5.2 10*3/uL (ref 3.9–10.3)
lymph#: 0.5 10*3/uL — ABNORMAL LOW (ref 0.9–3.3)

## 2013-05-03 LAB — URIC ACID (CC13): Uric Acid, Serum: 8.3 mg/dl — ABNORMAL HIGH (ref 2.6–7.4)

## 2013-05-03 LAB — SEDIMENTATION RATE: Sed Rate: 81 mm/hr — ABNORMAL HIGH (ref 0–22)

## 2013-05-03 LAB — CHCC SMEAR

## 2013-05-03 LAB — LACTATE DEHYDROGENASE (CC13): LDH: 193 U/L (ref 125–245)

## 2013-05-03 LAB — BILIRUBIN, DIRECT: BILIRUBIN DIRECT: 0.3 mg/dL (ref 0.0–0.3)

## 2013-05-03 NOTE — Progress Notes (Signed)
[place] OFFICE PROGRESS NOTE  Nicole Huh, MD DIAGNOSIS:  Chronic anemia  SUMMARY OF HEMATOLOGIC HISTORY: This is a pleasant 73 year old lady with background history of severe anemia. In July of 2013 she had bone marrow aspirate and biopsy that came back hypercellular but unremarkable. She was recommended to take iron supplements. Between November to December 2014, she had multiple hospitalization requiring blood transfusion. Iron supplement was discontinued and she was being observed. INTERVAL HISTORY: Nicole Meyer 73 y.o. female returns for further follow-up. She complained of mild fatigue. She verified that she has stopped taking iron supplement. The patient denies any recent signs or symptoms of bleeding such as spontaneous epistaxis, hematuria or hematochezia. I have reviewed the past medical history, past surgical history, social history and family history with the patient and they are unchanged from previous note.  ALLERGIES:  is allergic to penicillins; brovana; and paxil.  MEDICATIONS:  Current Outpatient Prescriptions  Medication Sig Dispense Refill  . aspirin EC 81 MG tablet Take 81 mg by mouth daily.      . beta carotene w/minerals (OCUVITE) tablet Take 1 tablet by mouth daily.      . calcium-vitamin D (OSCAL WITH D) 500-200 MG-UNIT per tablet Take 1 tablet by mouth 2 (two) times daily.      [name] HYDROcodone-acetaminophen (NORCO/VICODIN) 5-325 MG per tablet       . lisinopril (PRINIVIL) 10 MG tablet Take 1 tablet (10 mg total) by mouth daily.  30 tablet  2  . Multiple Vitamin (MULTIVITAMIN) tablet Take 1 tablet by mouth daily.      . ranitidine (ZANTAC) 150 MG tablet Take 150 mg by mouth daily.       No current facility-administered medications for this visit.     REVIEW OF SYSTEMS:   Constitutional: Denies fevers, chills or night sweats Eyes: Denies blurriness of vision Ears, nose, mouth, throat, and face: Denies mucositis or sore  throat Respiratory: Denies cough, dyspnea or wheezes Cardiovascular: Denies palpitation, chest discomfort or lower extremity swelling Gastrointestinal:  Denies nausea, heartburn or change in bowel habits Skin: Denies abnormal skin rashes Lymphatics: Denies new lymphadenopathy. She has some mild easy bruising Neurological:Denies numbness, tingling or new weaknesses Behavioral/Psych: Mood is stable, no new changes  All other systems were reviewed with the patient and are negative.  PHYSICAL EXAMINATION: ECOG PERFORMANCE STATUS: 0 - Asymptomatic  Filed Vitals:   05/03/13 1147  BP: 169/74  Pulse: 86  Temp: 97.8 F (36.6 C)  Resp: 18   Filed Weights   05/03/13 1147  Weight: 158 lb (71.668 kg)    GENERAL:alert, no distress and comfortable SKIN: skin color, texture, turgor are normal, no rashes or significant lesions. Some bruises on noted. EYES: normal, Conjunctiva are pink and non-injected, sclera clear OROPHARYNX:no exudate, no erythema and lips, buccal mucosa, and tongue normal  NECK: supple, thyroid normal size, non-tender, without nodularity LYMPH:  no palpable lymphadenopathy in the cervical, axillary or inguinal LUNGS: clear to auscultation and percussion with normal breathing effort HEART: regular rate & rhythm and no murmurs and no lower extremity edema ABDOMEN:abdomen soft, non-tender and normal bowel sounds Musculoskeletal:no cyanosis of digits and no clubbing  NEURO: alert & oriented x 3 with fluent speech, no focal motor/sensory deficits  LABORATORY DATA:  I have reviewed the data as listed Results for orders placed in visit on 05/03/13 (from the past 48 hour(s))  CBC & DIFF AND RETIC     Status: Abnormal   Collection Time  05/03/13 11:23 AM      Result Value Ref Range   WBC 5.2  3.9 - 10.3 10e3/uL   NEUT# 3.9  1.5 - 6.5 10e3/uL   HGB 9.4 (*) 11.6 - 15.9 g/dL   HCT 28.7 (*) 34.8 - 46.6 %   Platelets 216  145 - 400 10e3/uL   MCV 97.6  79.5 - 101.0 fL   MCH  32.0  25.1 - 34.0 pg   MCHC 32.8  31.5 - 36.0 g/dL   RBC 2.94 (*) 3.70 - 5.45 10e6/uL   RDW 13.7  11.2 - 14.5 %   lymph# 0.5 (*) 0.9 - 3.3 10e3/uL   MONO# 0.5  0.1 - 0.9 10e3/uL   Eosinophils Absolute 0.2  0.0 - 0.5 10e3/uL   Basophils Absolute 0.0  0.0 - 0.1 10e3/uL   NEUT% 76.5  38.4 - 76.8 %   LYMPH% 9.9 (*) 14.0 - 49.7 %   MONO% 8.9  0.0 - 14.0 %   EOS% 4.5  0.0 - 7.0 %   BASO% 0.2  0.0 - 2.0 %   Retic % 4.43 (*) 0.70 - 2.10 %   Retic Ct Abs 130.24 (*) 33.70 - 90.70 10e3/uL   Immature Retic Fract 12.50 (*) 1.60 - 10.00 %  [facility] SMEAR     Status: None   Collection Time    05/03/13 11:23 AM      Result Value Ref Range   Smear Result Smear Available    LACTATE DEHYDROGENASE (CC13)     Status: None   Collection Time    05/03/13 11:23 AM      Result Value Ref Range   LDH 193  125 - 245 U/L  URIC ACID (CC13)     Status: Abnormal   Collection Time    05/03/13 11:23 AM      Result Value Ref Range   Uric Acid, Serum 8.3 (*) 2.6 - 7.4 mg/dl  COMPREHENSIVE METABOLIC PANEL (DP82)     Status: Abnormal   Collection Time    05/03/13 11:23 AM      Result Value Ref Range   Sodium 141  136 - 145 mEq/L   Potassium 3.6  3.5 - 5.1 mEq/L   Chloride 107  98 - 109 mEq/L   CO2 25  22 - 29 mEq/L   Glucose 119  70 - 140 mg/dl   BUN 13.5  7.0 - 26.0 mg/dL   Creatinine 0.9  0.6 - 1.1 mg/dL   Total Bilirubin 2.02 (*) 0.20 - 1.20 mg/dL   Alkaline Phosphatase 64  40 - 150 U/L   AST 14  5 - 34 U/L   ALT 13  0 - 55 U/L   Total Protein 6.8  6.4 - 8.3 g/dL   Albumin 3.2 (*) 3.5 - 5.0 g/dL   Calcium 9.4  8.4 - 10.4 mg/dL   Anion Gap 9  3 - 11 mEq/L    Lab Results  Component Value Date   WBC 5.2 05/03/2013   HGB 9.4* 05/03/2013   HCT 28.7* 05/03/2013   MCV 97.6 05/03/2013   PLT 216 05/03/2013   ASSESSMENT & PLAN:  #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 Her most recent CT scan from December 2014 show no evidence of splenomegaly. She does not respond to prednisone therapy. She will continue on  multivitamin only for now. With the next visit, I will send off orders to rule out hereditary spherocytosis/unstable hemoglobinopathy and to rule out PNH. In the meantime, she'll continue followup with her primary care provider for supportive care. #3 iron overload The patient is advised to discontinue iron supplement.  All questions were answered. The patient knows to call the clinic with any problems, questions or concerns. No barriers to learning was detected.  I spent 15 minutes counseling the patient face to face. The total time spent in the appointment was 20 minutes and more than 50% was on counseling.     [place], [place], MD 05/03/2013 1:13 PM

## 2013-05-03 NOTE — Telephone Encounter (Signed)
, °

## 2013-06-21 ENCOUNTER — Other Ambulatory Visit: Payer: Self-pay | Admitting: Gastroenterology

## 2013-06-25 ENCOUNTER — Telehealth: Payer: Self-pay | Admitting: Hematology and Oncology

## 2013-06-25 NOTE — Telephone Encounter (Signed)
pt cld wanting to resch appt/stated cannot come on Tues/resch appt for 6/18-pt understood/agreed

## 2013-08-03 ENCOUNTER — Other Ambulatory Visit: Payer: Medicare PPO

## 2013-08-03 ENCOUNTER — Ambulatory Visit: Payer: Medicare PPO | Admitting: Hematology and Oncology

## 2013-08-04 ENCOUNTER — Other Ambulatory Visit (HOSPITAL_COMMUNITY): Payer: Self-pay | Admitting: Gastroenterology

## 2013-08-04 DIAGNOSIS — R110 Nausea: Secondary | ICD-10-CM

## 2013-08-05 ENCOUNTER — Other Ambulatory Visit (HOSPITAL_BASED_OUTPATIENT_CLINIC_OR_DEPARTMENT_OTHER): Payer: Medicare PPO

## 2013-08-05 ENCOUNTER — Telehealth: Payer: Self-pay | Admitting: Hematology and Oncology

## 2013-08-05 ENCOUNTER — Encounter: Payer: Self-pay | Admitting: Hematology and Oncology

## 2013-08-05 ENCOUNTER — Ambulatory Visit (HOSPITAL_BASED_OUTPATIENT_CLINIC_OR_DEPARTMENT_OTHER): Payer: Medicare PPO | Admitting: Hematology and Oncology

## 2013-08-05 VITALS — BP 153/70 | HR 78 | Temp 98.0°F | Resp 18 | Ht 63.0 in | Wt 152.5 lb

## 2013-08-05 DIAGNOSIS — D649 Anemia, unspecified: Secondary | ICD-10-CM

## 2013-08-05 DIAGNOSIS — N183 Chronic kidney disease, stage 3 unspecified: Secondary | ICD-10-CM

## 2013-08-05 DIAGNOSIS — D589 Hereditary hemolytic anemia, unspecified: Secondary | ICD-10-CM

## 2013-08-05 DIAGNOSIS — D598 Other acquired hemolytic anemias: Secondary | ICD-10-CM

## 2013-08-05 LAB — COMPREHENSIVE METABOLIC PANEL (CC13)
ALK PHOS: 68 U/L (ref 40–150)
ALT: 9 U/L (ref 0–55)
AST: 12 U/L (ref 5–34)
Albumin: 3.2 g/dL — ABNORMAL LOW (ref 3.5–5.0)
Anion Gap: 8 mEq/L (ref 3–11)
BILIRUBIN TOTAL: 1.71 mg/dL — AB (ref 0.20–1.20)
BUN: 14.7 mg/dL (ref 7.0–26.0)
CO2: 23 mEq/L (ref 22–29)
Calcium: 9.4 mg/dL (ref 8.4–10.4)
Chloride: 108 mEq/L (ref 98–109)
Creatinine: 1 mg/dL (ref 0.6–1.1)
Glucose: 120 mg/dl (ref 70–140)
Potassium: 3.9 mEq/L (ref 3.5–5.1)
SODIUM: 138 meq/L (ref 136–145)
TOTAL PROTEIN: 6.8 g/dL (ref 6.4–8.3)

## 2013-08-05 LAB — CBC & DIFF AND RETIC
BASO%: 0.7 % (ref 0.0–2.0)
Basophils Absolute: 0 10*3/uL (ref 0.0–0.1)
EOS ABS: 0.3 10*3/uL (ref 0.0–0.5)
EOS%: 6.9 % (ref 0.0–7.0)
HCT: 26.8 % — ABNORMAL LOW (ref 34.8–46.6)
HGB: 8.7 g/dL — ABNORMAL LOW (ref 11.6–15.9)
Immature Retic Fract: 11.1 % — ABNORMAL HIGH (ref 1.60–10.00)
LYMPH%: 15.3 % (ref 14.0–49.7)
MCH: 31.1 pg (ref 25.1–34.0)
MCHC: 32.5 g/dL (ref 31.5–36.0)
MCV: 95.7 fL (ref 79.5–101.0)
MONO#: 0.4 10*3/uL (ref 0.1–0.9)
MONO%: 7.8 % (ref 0.0–14.0)
NEUT%: 69.3 % (ref 38.4–76.8)
NEUTROS ABS: 3.1 10*3/uL (ref 1.5–6.5)
NRBC: 0 % (ref 0–0)
PLATELETS: 219 10*3/uL (ref 145–400)
RBC: 2.8 10*6/uL — ABNORMAL LOW (ref 3.70–5.45)
RDW: 13.2 % (ref 11.2–14.5)
Retic %: 3.43 % — ABNORMAL HIGH (ref 0.70–2.10)
Retic Ct Abs: 96.04 10*3/uL — ABNORMAL HIGH (ref 33.70–90.70)
WBC: 4.5 10*3/uL (ref 3.9–10.3)
lymph#: 0.7 10*3/uL — ABNORMAL LOW (ref 0.9–3.3)

## 2013-08-05 NOTE — Assessment & Plan Note (Signed)
This is multifactorial with component of hemolysis. Previously, she did not respond to prednisone. I need to order additional work up to exclude unstable hemoglobinopathy for the cause of her anemia. In the meantime, I recommend high-dose folic acid supplementation with this vitamin B12.

## 2013-08-05 NOTE — Assessment & Plan Note (Signed)
This is stable. Will monitor her carefully.

## 2013-08-05 NOTE — Progress Notes (Signed)
[place] OFFICE PROGRESS NOTE  Nicole Huh, MD SUMMARY OF HEMATOLOGIC HISTORY: This is a pleasant lady with background history of severe anemia. In July of 2013 she had bone marrow aspirate and biopsy that came back hypercellular but unremarkable. She was recommended to take iron supplements. Between November to December 2014, she had multiple hospitalization requiring blood transfusion. Iron supplement was discontinued and she was being observed. She was also placed on prednisone but did not respond to it. INTERVAL HISTORY: Nicole Meyer 73 y.o. female returns for further followup. She complained of fatigue. She denies passage of dark urine. Denies any chest pain  I have reviewed the past medical history, past surgical history, social history and family history with the patient and they are unchanged from previous note.  ALLERGIES:  is allergic to penicillins; brovana; and paxil.  MEDICATIONS:  Current Outpatient Prescriptions  Medication Sig Dispense Refill  . aspirin EC 81 MG tablet Take 81 mg by mouth daily.      . beta carotene w/minerals (OCUVITE) tablet Take 1 tablet by mouth daily.      . calcium-vitamin D (OSCAL WITH D) 500-200 MG-UNIT per tablet Take 1 tablet by mouth 2 (two) times daily.      [name] HYDROcodone-acetaminophen (NORCO/VICODIN) 5-325 MG per tablet       . lisinopril (PRINIVIL) 10 MG tablet Take 1 tablet (10 mg total) by mouth daily.  30 tablet  2  . Multiple Vitamin (MULTIVITAMIN) tablet Take 1 tablet by mouth daily.      . ranitidine (ZANTAC) 150 MG tablet Take 150 mg by mouth daily.       No current facility-administered medications for this visit.     REVIEW OF SYSTEMS:   Constitutional: Denies fevers, chills or night sweats Eyes: Denies blurriness of vision Ears, nose, mouth, throat, and face: Denies mucositis or sore throat Respiratory: Denies cough, dyspnea or wheezes Cardiovascular: Denies palpitation, chest discomfort or lower  extremity swelling Gastrointestinal:  Denies nausea, heartburn or change in bowel habits Skin: Denies abnormal skin rashes Lymphatics: Denies new lymphadenopathy or easy bruising Neurological:Denies numbness, tingling or new weaknesses Behavioral/Psych: Mood is stable, no new changes  All other systems were reviewed with the patient and are negative.  PHYSICAL EXAMINATION: ECOG PERFORMANCE STATUS: 1 - Symptomatic but completely ambulatory  Filed Vitals:   08/05/13 1217  BP: 153/70  Pulse: 78  Temp: 98 F (36.7 C)  Resp: 18   Filed Weights   08/05/13 1217  Weight: 152 lb 8 oz (69.174 kg)    GENERAL:alert, no distress and comfortable SKIN: skin color, texture, turgor are normal, no rashes or significant lesions EYES: normal, Conjunctiva are pale and non-injected, sclera clear OROPHARYNX:no exudate, no erythema and lips, buccal mucosa, and tongue normal  NECK: supple, thyroid normal size, non-tender, without nodularity LYMPH:  no palpable lymphadenopathy in the cervical, axillary or inguinal LUNGS: clear to auscultation and percussion with normal breathing effort HEART: regular rate & rhythm and no murmurs and no lower extremity edema ABDOMEN:abdomen soft, non-tender and normal bowel sounds Musculoskeletal:no cyanosis of digits and no clubbing  NEURO: alert & oriented x 3 with fluent speech, no focal motor/sensory deficits  LABORATORY DATA:  I have reviewed the data as listed Results for orders placed in visit on 08/05/13 (from the past 48 hour(s))  CBC & DIFF AND RETIC     Status: Abnormal   Collection Time    08/05/13 11:57 AM      Result Value Ref  Range   WBC 4.5  3.9 - 10.3 10e3/uL   NEUT# 3.1  1.5 - 6.5 10e3/uL   HGB 8.7 (*) 11.6 - 15.9 g/dL   HCT 26.8 (*) 34.8 - 46.6 %   Platelets 219  145 - 400 10e3/uL   MCV 95.7  79.5 - 101.0 fL   MCH 31.1  25.1 - 34.0 pg   MCHC 32.5  31.5 - 36.0 g/dL   RBC 2.80 (*) 3.70 - 5.45 10e6/uL   RDW 13.2  11.2 - 14.5 %   lymph# 0.7  (*) 0.9 - 3.3 10e3/uL   MONO# 0.4  0.1 - 0.9 10e3/uL   Eosinophils Absolute 0.3  0.0 - 0.5 10e3/uL   Basophils Absolute 0.0  0.0 - 0.1 10e3/uL   NEUT% 69.3  38.4 - 76.8 %   LYMPH% 15.3  14.0 - 49.7 %   MONO% 7.8  0.0 - 14.0 %   EOS% 6.9  0.0 - 7.0 %   BASO% 0.7  0.0 - 2.0 %   nRBC 0  0 - 0 %   Retic % 3.43 (*) 0.70 - 2.10 %   Retic Ct Abs 96.04 (*) 33.70 - 90.70 10e3/uL   Immature Retic Fract 11.10 (*) 1.60 - 10.00 %  COMPREHENSIVE METABOLIC PANEL (CH88)     Status: Abnormal   Collection Time    08/05/13 11:58 AM      Result Value Ref Range   Sodium 138  136 - 145 mEq/L   Potassium 3.9  3.5 - 5.1 mEq/L   Chloride 108  98 - 109 mEq/L   CO2 23  22 - 29 mEq/L   Glucose 120  70 - 140 mg/dl   BUN 14.7  7.0 - 26.0 mg/dL   Creatinine 1.0  0.6 - 1.1 mg/dL   Total Bilirubin 1.71 (*) 0.20 - 1.20 mg/dL   Alkaline Phosphatase 68  40 - 150 U/L   AST 12  5 - 34 U/L   ALT 9  0 - 55 U/L   Total Protein 6.8  6.4 - 8.3 g/dL   Albumin 3.2 (*) 3.5 - 5.0 g/dL   Calcium 9.4  8.4 - 10.4 mg/dL   Anion Gap 8  3 - 11 mEq/L    Lab Results  Component Value Date   WBC 4.5 08/05/2013   HGB 8.7* 08/05/2013   HCT 26.8* 08/05/2013   MCV 95.7 08/05/2013   PLT 219 08/05/2013    ASSESSMENT & PLAN:  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 Previously, she did not respond to prednisone. I need to order additional work up to exclude unstable hemoglobinopathy for the cause of her anemia. In the meantime, I recommend high-dose folic acid supplementation with this vitamin B12.  Chronic kidney disease (CKD), stage III (moderate) This is stable. Will monitor her carefully.   I recommend the patient returns on a weekly basis for blood work and transfuse as needed until further workup is completed.  All questions were answered. The patient knows to call the clinic with any problems, questions or concerns. No barriers to learning was detected.  I spent 25 minutes counseling the patient face to  face. The total time spent in the appointment was 30 minutes and more than 50% was on counseling.     [facility], Dymir Neeson, MD 08/05/2013 8:36 PM

## 2013-08-05 NOTE — Telephone Encounter (Signed)
gv and printed appt sched and avs for pt for June and July....pt needed 7.6 lab to be on wed due to cateract surgery

## 2013-08-09 ENCOUNTER — Other Ambulatory Visit (HOSPITAL_BASED_OUTPATIENT_CLINIC_OR_DEPARTMENT_OTHER): Payer: Medicare PPO

## 2013-08-09 DIAGNOSIS — D589 Hereditary hemolytic anemia, unspecified: Secondary | ICD-10-CM

## 2013-08-09 DIAGNOSIS — D649 Anemia, unspecified: Secondary | ICD-10-CM

## 2013-08-09 LAB — CBC & DIFF AND RETIC
BASO%: 0.2 % (ref 0.0–2.0)
Basophils Absolute: 0 10*3/uL (ref 0.0–0.1)
EOS%: 7.4 % — ABNORMAL HIGH (ref 0.0–7.0)
Eosinophils Absolute: 0.4 10*3/uL (ref 0.0–0.5)
HEMATOCRIT: 25.9 % — AB (ref 34.8–46.6)
HGB: 8.5 g/dL — ABNORMAL LOW (ref 11.6–15.9)
IMMATURE RETIC FRACT: 14.8 % — AB (ref 1.60–10.00)
LYMPH%: 12.4 % — AB (ref 14.0–49.7)
MCH: 31.3 pg (ref 25.1–34.0)
MCHC: 32.8 g/dL (ref 31.5–36.0)
MCV: 95.2 fL (ref 79.5–101.0)
MONO#: 0.3 10*3/uL (ref 0.1–0.9)
MONO%: 6.4 % (ref 0.0–14.0)
NEUT#: 3.8 10*3/uL (ref 1.5–6.5)
NEUT%: 73.6 % (ref 38.4–76.8)
PLATELETS: 250 10*3/uL (ref 145–400)
RBC: 2.72 10*6/uL — ABNORMAL LOW (ref 3.70–5.45)
RDW: 13.6 % (ref 11.2–14.5)
Retic %: 5.33 % — ABNORMAL HIGH (ref 0.70–2.10)
Retic Ct Abs: 144.98 10*3/uL — ABNORMAL HIGH (ref 33.70–90.70)
WBC: 5.2 10*3/uL (ref 3.9–10.3)
lymph#: 0.6 10*3/uL — ABNORMAL LOW (ref 0.9–3.3)
nRBC: 0 % (ref 0–0)

## 2013-08-09 LAB — LACTATE DEHYDROGENASE (CC13): LDH: 186 U/L (ref 125–245)

## 2013-08-09 LAB — HOLD TUBE, BLOOD BANK

## 2013-08-13 ENCOUNTER — Encounter: Payer: Self-pay | Admitting: *Deleted

## 2013-08-14 LAB — PNH PROFILE (-HIGH SENSITIVITY)
Number of markers:: 9
Viability (%): 67 %

## 2013-08-14 LAB — RBC OSMOTIC FRAGILITY
NACL  0.30%: 98 % (ref 80–100)
NACL  0.35%: 92 % (ref 72–100)
NACL  0.40%: 92 % (ref 65–100)
NACL  0.45%: 97 % — ABNORMAL HIGH (ref 54–96)
NACL  0.50%: 86 % (ref 36–88)
NACL  0.55%: 77 % — ABNORMAL HIGH (ref 5–70)
NACL  0.60%: 70 % — ABNORMAL HIGH (ref 0–40)
NACL  0.65%: 46 % — ABNORMAL HIGH (ref 0–19)
NACL 0.85%: 0 % (ref 0–0)

## 2013-08-16 ENCOUNTER — Telehealth: Payer: Self-pay | Admitting: Nurse Practitioner

## 2013-08-16 ENCOUNTER — Other Ambulatory Visit (HOSPITAL_BASED_OUTPATIENT_CLINIC_OR_DEPARTMENT_OTHER): Payer: Medicare PPO

## 2013-08-16 DIAGNOSIS — D589 Hereditary hemolytic anemia, unspecified: Secondary | ICD-10-CM

## 2013-08-16 DIAGNOSIS — D649 Anemia, unspecified: Secondary | ICD-10-CM

## 2013-08-16 LAB — CBC & DIFF AND RETIC
BASO%: 0.4 % (ref 0.0–2.0)
BASOS ABS: 0 10*3/uL (ref 0.0–0.1)
EOS ABS: 0.4 10*3/uL (ref 0.0–0.5)
EOS%: 8 % — ABNORMAL HIGH (ref 0.0–7.0)
HEMATOCRIT: 27 % — AB (ref 34.8–46.6)
HEMOGLOBIN: 8.7 g/dL — AB (ref 11.6–15.9)
IMMATURE RETIC FRACT: 17 % — AB (ref 1.60–10.00)
LYMPH#: 0.7 10*3/uL — AB (ref 0.9–3.3)
LYMPH%: 14.7 % (ref 14.0–49.7)
MCH: 30.9 pg (ref 25.1–34.0)
MCHC: 32.2 g/dL (ref 31.5–36.0)
MCV: 95.7 fL (ref 79.5–101.0)
MONO#: 0.3 10*3/uL (ref 0.1–0.9)
MONO%: 5.7 % (ref 0.0–14.0)
NEUT%: 71.2 % (ref 38.4–76.8)
NEUTROS ABS: 3.4 10*3/uL (ref 1.5–6.5)
Platelets: 264 10*3/uL (ref 145–400)
RBC: 2.82 10*6/uL — ABNORMAL LOW (ref 3.70–5.45)
RDW: 14.2 % (ref 11.2–14.5)
Retic %: 4.96 % — ABNORMAL HIGH (ref 0.70–2.10)
Retic Ct Abs: 139.87 10*3/uL — ABNORMAL HIGH (ref 33.70–90.70)
WBC: 4.8 10*3/uL (ref 3.9–10.3)
nRBC: 0 % (ref 0–0)

## 2013-08-16 LAB — LACTATE DEHYDROGENASE (CC13): LDH: 175 U/L (ref 125–245)

## 2013-08-16 LAB — HOLD TUBE, BLOOD BANK

## 2013-08-16 NOTE — Telephone Encounter (Signed)
Spoke to patient and her husband in the lobby to inform her labs today are stable, not requiring transfusion per MD. Per Dr. Alvy Bimler, patient encouraged to keep her appointment with MD next week and continue taking folic acid 1 mg daily. Patient verbalizes understanding of the above.

## 2013-08-17 ENCOUNTER — Ambulatory Visit (HOSPITAL_COMMUNITY): Payer: Medicare PPO

## 2013-08-19 ENCOUNTER — Ambulatory Visit (HOSPITAL_COMMUNITY)
Admission: RE | Admit: 2013-08-19 | Discharge: 2013-08-19 | Disposition: A | Discharge status: Home / Self Care | Payer: Medicare PPO | Source: Ambulatory Visit | Attending: Gastroenterology | Admitting: Gastroenterology

## 2013-08-19 DIAGNOSIS — R109 Unspecified abdominal pain: Secondary | ICD-10-CM | POA: Insufficient documentation

## 2013-08-19 DIAGNOSIS — R110 Nausea: Secondary | ICD-10-CM | POA: Insufficient documentation

## 2013-08-19 MED ORDER — TECHNETIUM TC 99M SULFUR COLLOID
2.0000 | Freq: Once | INTRAVENOUS | Status: AC | PRN
  Administered 2013-08-19: 2 via INTRAVENOUS

## 2013-08-25 ENCOUNTER — Other Ambulatory Visit (HOSPITAL_BASED_OUTPATIENT_CLINIC_OR_DEPARTMENT_OTHER): Payer: Medicare PPO

## 2013-08-25 ENCOUNTER — Telehealth: Payer: Self-pay | Admitting: *Deleted

## 2013-08-25 DIAGNOSIS — D649 Anemia, unspecified: Secondary | ICD-10-CM

## 2013-08-25 DIAGNOSIS — D589 Hereditary hemolytic anemia, unspecified: Secondary | ICD-10-CM

## 2013-08-25 LAB — CBC & DIFF AND RETIC
BASO%: 0.5 % (ref 0.0–2.0)
Basophils Absolute: 0 10*3/uL (ref 0.0–0.1)
EOS%: 9.2 % — ABNORMAL HIGH (ref 0.0–7.0)
Eosinophils Absolute: 0.4 10*3/uL (ref 0.0–0.5)
HEMATOCRIT: 30.9 % — AB (ref 34.8–46.6)
HGB: 10.1 g/dL — ABNORMAL LOW (ref 11.6–15.9)
Immature Retic Fract: 8.5 % (ref 1.60–10.00)
LYMPH%: 16 % (ref 14.0–49.7)
MCH: 31.4 pg (ref 25.1–34.0)
MCHC: 32.7 g/dL (ref 31.5–36.0)
MCV: 96 fL (ref 79.5–101.0)
MONO#: 0.4 10*3/uL (ref 0.1–0.9)
MONO%: 9.4 % (ref 0.0–14.0)
NEUT#: 2.6 10*3/uL (ref 1.5–6.5)
NEUT%: 64.9 % (ref 38.4–76.8)
PLATELETS: 216 10*3/uL (ref 145–400)
RBC: 3.22 10*6/uL — ABNORMAL LOW (ref 3.70–5.45)
RDW: 13.9 % (ref 11.2–14.5)
Retic %: 3.19 % — ABNORMAL HIGH (ref 0.70–2.10)
Retic Ct Abs: 102.72 10*3/uL — ABNORMAL HIGH (ref 33.70–90.70)
WBC: 3.9 10*3/uL (ref 3.9–10.3)
lymph#: 0.6 10*3/uL — ABNORMAL LOW (ref 0.9–3.3)

## 2013-08-25 LAB — HOLD TUBE, BLOOD BANK

## 2013-08-25 LAB — LACTATE DEHYDROGENASE (CC13): LDH: 174 U/L (ref 125–245)

## 2013-08-25 NOTE — Telephone Encounter (Signed)
Spoke w/ pt in lobby. Informed her of CBC ok and to keep her appt next week as scheduled. She verbalized understanding.

## 2013-08-25 NOTE — Telephone Encounter (Signed)
Message copied by Cathlean Cower on Wed Aug 25, 2013 10:47 AM ------      Message from: [facility], [place]: Wed Aug 25, 2013 10:37 AM      Regarding: cbc       pls let her know cbc ok      ----- Message -----         From: [facility]         Sent: 08/25/2013  10:30 AM           To: Heath Lark, MD                   ------

## 2013-08-30 ENCOUNTER — Other Ambulatory Visit (HOSPITAL_BASED_OUTPATIENT_CLINIC_OR_DEPARTMENT_OTHER): Payer: Medicare PPO

## 2013-08-30 ENCOUNTER — Encounter: Payer: Self-pay | Admitting: Hematology and Oncology

## 2013-08-30 ENCOUNTER — Ambulatory Visit (HOSPITAL_BASED_OUTPATIENT_CLINIC_OR_DEPARTMENT_OTHER): Payer: Medicare PPO | Admitting: Hematology and Oncology

## 2013-08-30 ENCOUNTER — Telehealth: Payer: Self-pay | Admitting: Hematology and Oncology

## 2013-08-30 VITALS — BP 141/64 | HR 80 | Temp 98.4°F | Resp 18 | Ht 63.0 in | Wt 152.6 lb

## 2013-08-30 DIAGNOSIS — D596 Hemoglobinuria due to hemolysis from other external causes: Secondary | ICD-10-CM

## 2013-08-30 DIAGNOSIS — G8929 Other chronic pain: Secondary | ICD-10-CM

## 2013-08-30 DIAGNOSIS — D489 Neoplasm of uncertain behavior, unspecified: Secondary | ICD-10-CM

## 2013-08-30 DIAGNOSIS — M2550 Pain in unspecified joint: Secondary | ICD-10-CM

## 2013-08-30 DIAGNOSIS — D599 Acquired hemolytic anemia, unspecified: Secondary | ICD-10-CM

## 2013-08-30 DIAGNOSIS — D589 Hereditary hemolytic anemia, unspecified: Secondary | ICD-10-CM

## 2013-08-30 LAB — CBC & DIFF AND RETIC
BASO%: 0.2 % (ref 0.0–2.0)
BASOS ABS: 0 10*3/uL (ref 0.0–0.1)
EOS%: 5.3 % (ref 0.0–7.0)
Eosinophils Absolute: 0.3 10*3/uL (ref 0.0–0.5)
HCT: 29.6 % — ABNORMAL LOW (ref 34.8–46.6)
HEMOGLOBIN: 9.7 g/dL — AB (ref 11.6–15.9)
IMMATURE RETIC FRACT: 4.6 % (ref 1.60–10.00)
LYMPH#: 0.6 10*3/uL — AB (ref 0.9–3.3)
LYMPH%: 10 % — ABNORMAL LOW (ref 14.0–49.7)
MCH: 31.5 pg (ref 25.1–34.0)
MCHC: 32.8 g/dL (ref 31.5–36.0)
MCV: 96.1 fL (ref 79.5–101.0)
MONO#: 0.5 10*3/uL (ref 0.1–0.9)
MONO%: 8.2 % (ref 0.0–14.0)
NEUT#: 4.6 10*3/uL (ref 1.5–6.5)
NEUT%: 76.3 % (ref 38.4–76.8)
Platelets: 170 10*3/uL (ref 145–400)
RBC: 3.08 10*6/uL — ABNORMAL LOW (ref 3.70–5.45)
RDW: 13.5 % (ref 11.2–14.5)
RETIC CT ABS: 76.69 10*3/uL (ref 33.70–90.70)
Retic %: 2.49 % — ABNORMAL HIGH (ref 0.70–2.10)
WBC: 6 10*3/uL (ref 3.9–10.3)

## 2013-08-30 LAB — LACTATE DEHYDROGENASE (CC13): LDH: 196 U/L (ref 125–245)

## 2013-08-30 LAB — HOLD TUBE, BLOOD BANK

## 2013-08-30 MED ORDER — PREDNISONE 10 MG PO TABS
20.0000 mg | ORAL_TABLET | Freq: Every day | ORAL | Status: DC
Start: 2013-08-30 — End: 2014-08-01

## 2013-08-30 NOTE — Telephone Encounter (Signed)
Confirmed apt labs/ov per 07/13 POF gave AVS .......KJ

## 2013-08-30 NOTE — Assessment & Plan Note (Signed)
I discussed with her the risk and benefits of long-term prednisone. I will put her back on 20 mg daily and slow taper over the next few months. She has acute joint swelling at the left fourth digit distal interphalangeal joint.

## 2013-08-30 NOTE — Assessment & Plan Note (Signed)
Screening tests for PNH was negative. 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 I recommend she takes high-dose folic acid and vitamin B12 supplements. I discussed with her the pros and cons of splenectomy but the patient declined further surgery at this point. I would bring her back on a monthly basis to check and monitor her CBC closely for now.

## 2013-08-30 NOTE — Progress Notes (Signed)
[place] OFFICE PROGRESS NOTE  Nicole Huh, MD  SUMMARY OF HEMATOLOGIC HISTORY: This is a pleasant lady with background history of severe anemia. In July of 2013 she had bone marrow aspirate and biopsy that came back hypercellular but unremarkable. She was recommended to take iron supplements. Between November to December 2014, she had multiple hospitalization requiring blood transfusion. Iron supplement was discontinued and she was being observed. She was also placed on prednisone but did not respond to it. Osmotic fragility testing came back positive, suspicious for hemoglobinopathy such as spherocytosis. INTERVAL HISTORY: Nicole Meyer 73 y.o. female returns for further followup. She complained of fatigue. The patient denies any recent signs or symptoms of bleeding such as spontaneous epistaxis, hematuria or hematochezia. She complained of severe joint pains especially in her hands.  I have reviewed the past medical history, past surgical history, social history and family history with the patient and they are unchanged from previous note.  ALLERGIES:  is allergic to penicillins; brovana; and paxil.  MEDICATIONS:  Current Outpatient Prescriptions  Medication Sig Dispense Refill  . ALPRAZolam (XANAX) 0.5 MG tablet Take 0.5 mg by mouth daily.      [name] aspirin EC 81 MG tablet Take 81 mg by mouth daily.      . beta carotene w/minerals (OCUVITE) tablet Take 1 tablet by mouth daily.      . Calcium Carb-Cholecalciferol (CALCIUM 600 + D) 600-200 MG-UNIT TABS Take 1 tablet by mouth daily.      . calcium-vitamin D (OSCAL WITH D) 500-200 MG-UNIT per tablet Take 1 tablet by mouth 2 (two) times daily.      . colchicine (COLCRYS) 0.6 MG tablet Take 0.6 mg by mouth daily.      . Cranberry 500 MG CAPS Take 500 mg by mouth daily.      [name] DICLOFENAC SODIUM PO Take 50 mg by mouth 2 (two) times daily.      . folic acid (FOLVITE) 1 MG tablet Take 1 mg by mouth daily.      [name]  HYDROcodone-acetaminophen (NORCO/VICODIN) 5-325 MG per tablet       . lisinopril (PRINIVIL) 10 MG tablet Take 1 tablet (10 mg total) by mouth daily.  30 tablet  2  . Multiple Vitamin (MULTIVITAMIN) tablet Take 1 tablet by mouth daily.      . Nutritional Supplements (ESTROVEN PO) Take 1 tablet by mouth daily.      . ranitidine (ZANTAC) 150 MG tablet Take 150 mg by mouth daily.      . predniSONE (DELTASONE) 10 MG tablet Take 2 tablets (20 mg total) by mouth daily with breakfast.  90 tablet  1  . vitamin B-12 (CYANOCOBALAMIN) 500 MCG tablet Take 500 mcg by mouth daily.       No current facility-administered medications for this visit.     REVIEW OF SYSTEMS:   Constitutional: Denies fevers, chills or night sweats Eyes: Denies blurriness of vision Ears, nose, mouth, throat, and face: Denies mucositis or sore throat Respiratory: Denies cough, dyspnea or wheezes Cardiovascular: Denies palpitation, chest discomfort or lower extremity swelling Gastrointestinal:  Denies nausea, heartburn or change in bowel habits Skin: Denies abnormal skin rashes Lymphatics: Denies new lymphadenopathy or easy bruising Neurological:Denies numbness, tingling or new weaknesses Behavioral/Psych: Mood is stable, no new changes  All other systems were reviewed with the patient and are negative.  PHYSICAL EXAMINATION: ECOG PERFORMANCE STATUS: 1 - Symptomatic but completely ambulatory  Filed Vitals:   08/30/13 1106  BP:  141/64  Pulse: 80  Temp: 98.4 F (36.9 C)  Resp: 18   Filed Weights   08/30/13 1106  Weight: 152 lb 9.6 oz (69.219 kg)    GENERAL:alert, no distress and comfortable SKIN: skin color, texture, turgor are normal, no rashes or significant lesions. Noticed extensive bruising EYES: normal, Conjunctiva are pink and non-injected, sclera clear OROPHARYNX:no exudate, no erythema and lips, buccal mucosa, and tongue normal  NECK: supple, thyroid normal size, non-tender, without nodularity LYMPH:  no  palpable lymphadenopathy in the cervical, axillary or inguinal LUNGS: clear to auscultation and percussion with normal breathing effort HEART: regular rate & rhythm and no murmurs and no lower extremity edema ABDOMEN:abdomen soft, non-tender and normal bowel sounds Musculoskeletal:no cyanosis of digits and no clubbing. Noted significant joint swelling in her hands. NEURO: alert & oriented x 3 with fluent speech, no focal motor/sensory deficits  LABORATORY DATA:  I have reviewed the data as listed Results for orders placed in visit on 08/30/13 (from the past 48 hour(s))  CBC & DIFF AND RETIC     Status: Abnormal   Collection Time    08/30/13 10:38 AM      Result Value Ref Range   WBC 6.0  3.9 - 10.3 10e3/uL   NEUT# 4.6  1.5 - 6.5 10e3/uL   HGB 9.7 (*) 11.6 - 15.9 g/dL   HCT 29.6 (*) 34.8 - 46.6 %   Platelets 170  145 - 400 10e3/uL   MCV 96.1  79.5 - 101.0 fL   MCH 31.5  25.1 - 34.0 pg   MCHC 32.8  31.5 - 36.0 g/dL   RBC 3.08 (*) 3.70 - 5.45 10e6/uL   RDW 13.5  11.2 - 14.5 %   lymph# 0.6 (*) 0.9 - 3.3 10e3/uL   MONO# 0.5  0.1 - 0.9 10e3/uL   Eosinophils Absolute 0.3  0.0 - 0.5 10e3/uL   Basophils Absolute 0.0  0.0 - 0.1 10e3/uL   NEUT% 76.3  38.4 - 76.8 %   LYMPH% 10.0 (*) 14.0 - 49.7 %   MONO% 8.2  0.0 - 14.0 %   EOS% 5.3  0.0 - 7.0 %   BASO% 0.2  0.0 - 2.0 %   Retic % 2.49 (*) 0.70 - 2.10 %   Retic Ct Abs 76.69  33.70 - 90.70 10e3/uL   Immature Retic Fract 4.60  1.60 - 10.00 %  LACTATE DEHYDROGENASE (CC13)     Status: None   Collection Time    08/30/13 10:38 AM      Result Value Ref Range   LDH 196  125 - 245 U/L  HOLD TUBE, BLOOD BANK     Status: None   Collection Time    08/30/13 10:38 AM      Result Value Ref Range   Hold Tube, Blood Bank Blood Bank Order Cancelled per Cameo      Lab Results  Component Value Date   WBC 6.0 08/30/2013   HGB 9.7* 08/30/2013   HCT 29.6* 08/30/2013   MCV 96.1 08/30/2013   PLT 170 08/30/2013    ASSESSMENT & PLAN:  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 I recommend she takes high-dose folic acid and vitamin B12 supplements. I discussed with her the pros and cons of splenectomy but the patient declined further surgery at this point. I would bring her back on a monthly basis to check and monitor her CBC  closely for now.  Chronic joint pain I discussed with her the risk and benefits of long-term prednisone. I will put her back on 20 mg daily and slow taper over the next few months. She has acute joint swelling at the left fourth digit distal interphalangeal joint.    All questions were answered. The patient knows to call the clinic with any problems, questions or concerns. No barriers to learning was detected.  I spent 25 minutes counseling the patient face to face. The total time spent in the appointment was 30 minutes and more than 50% was on counseling.     [facility], [place], MD 08/30/2013 9:45 PM

## 2013-09-27 ENCOUNTER — Other Ambulatory Visit (HOSPITAL_BASED_OUTPATIENT_CLINIC_OR_DEPARTMENT_OTHER): Payer: Medicare PPO

## 2013-09-27 ENCOUNTER — Ambulatory Visit (HOSPITAL_BASED_OUTPATIENT_CLINIC_OR_DEPARTMENT_OTHER): Payer: Medicare PPO | Admitting: Hematology and Oncology

## 2013-09-27 ENCOUNTER — Encounter: Payer: Self-pay | Admitting: Hematology and Oncology

## 2013-09-27 ENCOUNTER — Telehealth: Payer: Self-pay | Admitting: Hematology and Oncology

## 2013-09-27 VITALS — BP 154/68 | HR 77 | Temp 98.7°F | Resp 18 | Ht 63.0 in | Wt 150.7 lb

## 2013-09-27 DIAGNOSIS — D599 Acquired hemolytic anemia, unspecified: Secondary | ICD-10-CM

## 2013-09-27 DIAGNOSIS — M2550 Pain in unspecified joint: Secondary | ICD-10-CM

## 2013-09-27 DIAGNOSIS — G8929 Other chronic pain: Secondary | ICD-10-CM

## 2013-09-27 DIAGNOSIS — D589 Hereditary hemolytic anemia, unspecified: Secondary | ICD-10-CM

## 2013-09-27 LAB — CBC & DIFF AND RETIC
BASO%: 0.3 % (ref 0.0–2.0)
Basophils Absolute: 0 10*3/uL (ref 0.0–0.1)
EOS%: 4.2 % (ref 0.0–7.0)
Eosinophils Absolute: 0.2 10*3/uL (ref 0.0–0.5)
HCT: 32 % — ABNORMAL LOW (ref 34.8–46.6)
HGB: 10.4 g/dL — ABNORMAL LOW (ref 11.6–15.9)
Immature Retic Fract: 6.9 % (ref 1.60–10.00)
LYMPH%: 11.4 % — AB (ref 14.0–49.7)
MCH: 31.1 pg (ref 25.1–34.0)
MCHC: 32.5 g/dL (ref 31.5–36.0)
MCV: 95.8 fL (ref 79.5–101.0)
MONO#: 0.3 10*3/uL (ref 0.1–0.9)
MONO%: 5.5 % (ref 0.0–14.0)
NEUT#: 4.5 10*3/uL (ref 1.5–6.5)
NEUT%: 78.6 % — ABNORMAL HIGH (ref 38.4–76.8)
PLATELETS: 222 10*3/uL (ref 145–400)
RBC: 3.34 10*6/uL — ABNORMAL LOW (ref 3.70–5.45)
RDW: 13.6 % (ref 11.2–14.5)
RETIC %: 2.44 % — AB (ref 0.70–2.10)
Retic Ct Abs: 81.5 10*3/uL (ref 33.70–90.70)
WBC: 5.8 10*3/uL (ref 3.9–10.3)
lymph#: 0.7 10*3/uL — ABNORMAL LOW (ref 0.9–3.3)

## 2013-09-27 LAB — HOLD TUBE, BLOOD BANK

## 2013-09-27 MED ORDER — PREDNISONE 2.5 MG PO TABS: 2.5000 mg | ORAL_TABLET | Freq: Every day | ORAL | Status: DC

## 2013-09-27 NOTE — Assessment & Plan Note (Signed)
I discussed with her the risk and benefits of long-term prednisone. I will continue prednisone taper. She needs chronic pain management through her primary care provider.

## 2013-09-27 NOTE — Telephone Encounter (Signed)
Pt confirmed labs/ov per 08/10 POF, gave pt AVS...Marland KitchenMarland KitchenKJ

## 2013-09-27 NOTE — Progress Notes (Signed)
[place] OFFICE PROGRESS NOTE  [name], MD SUMMARY OF HEMATOLOGIC HISTORY: This is a pleasant lady with background history of severe anemia. In July of 2013 she had bone marrow aspirate and biopsy that came back hypercellular but unremarkable. She was recommended to take iron supplements. Between November to December 2014, she had multiple hospitalization requiring blood transfusion. Iron supplement was discontinued and she was being observed. She was also placed on prednisone but did not respond to it. Osmotic fragility testing came back positive, suspicious for hemoglobinopathy such as spherocytosis. INTERVAL HISTORY: Nicole Meyer 73 y.o. female returns for  Further followup. She denies bleeding. She continues to complain of chronic joint pain. The pain is limited to her knees and hands. Prednisone helps. I have reviewed the past medical history, past surgical history, social history and family history with the patient and they are unchanged from previous note.  ALLERGIES:  is allergic to penicillins; brovana; and paxil.  MEDICATIONS:  Current Outpatient Prescriptions  Medication Sig Dispense Refill  . ALPRAZolam (XANAX) 0.5 MG tablet Take 0.5 mg by mouth daily.      [name] aspirin EC 81 MG tablet Take 81 mg by mouth daily.      . beta carotene w/minerals (OCUVITE) tablet Take 1 tablet by mouth daily.      . Calcium Carb-Cholecalciferol (CALCIUM 600 + D) 600-200 MG-UNIT TABS Take 1 tablet by mouth daily.      . calcium-vitamin D (OSCAL WITH D) 500-200 MG-UNIT per tablet Take 1 tablet by mouth 2 (two) times daily.      . Cranberry 500 MG CAPS Take 500 mg by mouth daily.      . folic acid (FOLVITE) 1 MG tablet Take 1 mg by mouth daily.      [name] HYDROcodone-acetaminophen (NORCO/VICODIN) 5-325 MG per tablet       . lisinopril (PRINIVIL) 10 MG tablet Take 1 tablet (10 mg total) by mouth daily.  30 tablet  2  . Multiple Vitamin (MULTIVITAMIN) tablet Take 1 tablet by  mouth daily.      . Nutritional Supplements (ESTROVEN PO) Take 1 tablet by mouth daily.      . predniSONE (DELTASONE) 10 MG tablet Take 2 tablets (20 mg total) by mouth daily with breakfast.  90 tablet  1  . ranitidine (ZANTAC) 150 MG tablet Take 150 mg by mouth daily.      . vitamin B-12 (CYANOCOBALAMIN) 500 MCG tablet Take 500 mcg by mouth daily.      . predniSONE (DELTASONE) 2.5 MG tablet Take 1 tablet (2.5 mg total) by mouth daily with breakfast.  50 tablet  0   No current facility-administered medications for this visit.     REVIEW OF SYSTEMS:   Constitutional: Denies fevers, chills or night sweats Eyes: Denies blurriness of vision Ears, nose, mouth, throat, and face: Denies mucositis or sore throat Respiratory: Denies cough, dyspnea or wheezes Cardiovascular: Denies palpitation, chest discomfort or lower extremity swelling Gastrointestinal:  Denies nausea, heartburn or change in bowel habits Skin: Denies abnormal skin rashes Lymphatics: Denies new lymphadenopathy or easy bruising Neurological:Denies numbness, tingling or new weaknesses Behavioral/Psych: Mood is stable, no new changes  All other systems were reviewed with the patient and are negative.  PHYSICAL EXAMINATION: ECOG PERFORMANCE STATUS: 0 - Asymptomatic  Filed Vitals:   09/27/13 1032  BP: 154/68  Pulse: 77  Temp: 98.7 F (37.1 C)  Resp: 18   Filed Weights   09/27/13 1032  Weight: 150 lb  11.2 oz (68.357 kg)    GENERAL:alert, no distress and comfortable SKIN: She has extensive bruises. No rashes. EYES: normal, Conjunctiva are pink and non-injected, sclera clear Musculoskeletal:no cyanosis of digits and no clubbing  NEURO: alert & oriented x 3 with fluent speech, no focal motor/sensory deficits  LABORATORY DATA:  I have reviewed the data as listed Results for orders placed in visit on 09/27/13 (from the past 48 hour(s))  CBC & DIFF AND RETIC     Status: Abnormal   Collection Time    09/27/13 10:17 AM       Result Value Ref Range   WBC 5.8  3.9 - 10.3 10e3/uL   NEUT# 4.5  1.5 - 6.5 10e3/uL   HGB 10.4 (*) 11.6 - 15.9 g/dL   HCT 32.0 (*) 34.8 - 46.6 %   Platelets 222  145 - 400 10e3/uL   MCV 95.8  79.5 - 101.0 fL   MCH 31.1  25.1 - 34.0 pg   MCHC 32.5  31.5 - 36.0 g/dL   RBC 3.34 (*) 3.70 - 5.45 10e6/uL   RDW 13.6  11.2 - 14.5 %   lymph# 0.7 (*) 0.9 - 3.3 10e3/uL   MONO# 0.3  0.1 - 0.9 10e3/uL   Eosinophils Absolute 0.2  0.0 - 0.5 10e3/uL   Basophils Absolute 0.0  0.0 - 0.1 10e3/uL   NEUT% 78.6 (*) 38.4 - 76.8 %   LYMPH% 11.4 (*) 14.0 - 49.7 %   MONO% 5.5  0.0 - 14.0 %   EOS% 4.2  0.0 - 7.0 %   BASO% 0.3  0.0 - 2.0 %   Retic % 2.44 (*) 0.70 - 2.10 %   Retic Ct Abs 81.50  33.70 - 90.70 10e3/uL   Immature Retic Fract 6.90  1.60 - 10.00 %    Lab Results  Component Value Date   WBC 5.8 09/27/2013   HGB 10.4* 09/27/2013   HCT 32.0* 09/27/2013   MCV 95.8 09/27/2013   PLT 222 09/27/2013   ASSESSMENT & PLAN:  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 I recommend she takes high-dose folic acid and vitamin B12 supplements. I discussed with her the pros and cons of splenectomy but the patient declined further surgery at this point. I will continue prednisone taper and she should come off it by an of September. I will see her back in 3 months.    Chronic joint pain I discussed with her the risk and benefits of long-term prednisone. I will continue prednisone taper. She needs chronic pain management through her primary care provider.    All questions were answered. The patient knows to call the clinic with any problems, questions or concerns. No barriers to learning was detected.  I spent 15 minutes counseling the patient face to face. The total time spent in the appointment was 20 minutes and more than 50% was on counseling.     [place], [place], MD 09/27/2013  10:55 AM

## 2013-09-27 NOTE — Assessment & Plan Note (Signed)
Screening tests for PNH was negative. 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 I recommend she takes high-dose folic acid and vitamin B12 supplements. I discussed with her the pros and cons of splenectomy but the patient declined further surgery at this point. I will continue prednisone taper and she should come off it by an of September. I will see her back in 3 months.

## 2013-10-28 ENCOUNTER — Other Ambulatory Visit: Payer: Self-pay

## 2013-10-28 DIAGNOSIS — Z1231 Encounter for screening mammogram for malignant neoplasm of breast: Secondary | ICD-10-CM

## 2013-11-16 ENCOUNTER — Ambulatory Visit: Payer: Medicare PPO

## 2013-11-17 ENCOUNTER — Ambulatory Visit
Admission: RE | Admit: 2013-11-17 | Discharge: 2013-11-17 | Disposition: A | Discharge status: Home / Self Care | Payer: Medicare PPO | Source: Ambulatory Visit

## 2013-11-17 DIAGNOSIS — Z1231 Encounter for screening mammogram for malignant neoplasm of breast: Secondary | ICD-10-CM

## 2013-12-07 ENCOUNTER — Telehealth: Payer: Self-pay | Admitting: Hematology and Oncology

## 2013-12-07 ENCOUNTER — Other Ambulatory Visit: Payer: Self-pay | Admitting: *Deleted

## 2013-12-07 ENCOUNTER — Telehealth: Payer: Self-pay | Admitting: *Deleted

## 2013-12-07 ENCOUNTER — Ambulatory Visit (HOSPITAL_BASED_OUTPATIENT_CLINIC_OR_DEPARTMENT_OTHER): Payer: Medicare PPO

## 2013-12-07 ENCOUNTER — Other Ambulatory Visit: Payer: Self-pay | Admitting: Hematology and Oncology

## 2013-12-07 DIAGNOSIS — D589 Hereditary hemolytic anemia, unspecified: Secondary | ICD-10-CM

## 2013-12-07 LAB — CBC & DIFF AND RETIC
BASO%: 0.3 % (ref 0.0–2.0)
BASOS ABS: 0 10*3/uL (ref 0.0–0.1)
EOS%: 6.2 % (ref 0.0–7.0)
Eosinophils Absolute: 0.2 10*3/uL (ref 0.0–0.5)
HEMATOCRIT: 28.9 % — AB (ref 34.8–46.6)
HGB: 9.6 g/dL — ABNORMAL LOW (ref 11.6–15.9)
IMMATURE RETIC FRACT: 10.9 % — AB (ref 1.60–10.00)
LYMPH#: 0.4 10*3/uL — AB (ref 0.9–3.3)
LYMPH%: 10.1 % — AB (ref 14.0–49.7)
MCH: 31.9 pg (ref 25.1–34.0)
MCHC: 33.2 g/dL (ref 31.5–36.0)
MCV: 96 fL (ref 79.5–101.0)
MONO#: 0.3 10*3/uL (ref 0.1–0.9)
MONO%: 8.4 % (ref 0.0–14.0)
NEUT#: 2.7 10*3/uL (ref 1.5–6.5)
NEUT%: 75 % (ref 38.4–76.8)
Platelets: 166 10*3/uL (ref 145–400)
RBC: 3.01 10*6/uL — ABNORMAL LOW (ref 3.70–5.45)
RDW: 13.4 % (ref 11.2–14.5)
Retic %: 2.83 % — ABNORMAL HIGH (ref 0.70–2.10)
Retic Ct Abs: 85.18 10*3/uL (ref 33.70–90.70)
WBC: 3.6 10*3/uL — ABNORMAL LOW (ref 3.9–10.3)

## 2013-12-07 LAB — COMPREHENSIVE METABOLIC PANEL (CC13)
ALT: 12 U/L (ref 0–55)
AST: 15 U/L (ref 5–34)
Albumin: 3.2 g/dL — ABNORMAL LOW (ref 3.5–5.0)
Alkaline Phosphatase: 69 U/L (ref 40–150)
Anion Gap: 9 mEq/L (ref 3–11)
BUN: 14 mg/dL (ref 7.0–26.0)
CALCIUM: 9.5 mg/dL (ref 8.4–10.4)
CHLORIDE: 108 meq/L (ref 98–109)
CO2: 23 meq/L (ref 22–29)
Creatinine: 1.1 mg/dL (ref 0.6–1.1)
GLUCOSE: 135 mg/dL (ref 70–140)
Potassium: 3.9 mEq/L (ref 3.5–5.1)
SODIUM: 139 meq/L (ref 136–145)
Total Bilirubin: 2.81 mg/dL — ABNORMAL HIGH (ref 0.20–1.20)
Total Protein: 6.7 g/dL (ref 6.4–8.3)

## 2013-12-07 LAB — HOLD TUBE, BLOOD BANK

## 2013-12-07 LAB — LACTATE DEHYDROGENASE (CC13): LDH: 198 U/L (ref 125–245)

## 2013-12-07 NOTE — Telephone Encounter (Signed)
s.w. pt and sched for appt...pt aware of d.t

## 2013-12-07 NOTE — Telephone Encounter (Signed)
added appt per pof....per Tammi she will contact pt

## 2013-12-07 NOTE — Telephone Encounter (Signed)
Patients husband called to say patient is feeling like she is going to pass out. To come in for labs and wait for Dr Alvy Bimler to see results.

## 2013-12-07 NOTE — Telephone Encounter (Signed)
Pt would like to resume prednisone 2.5 mg daily per Dr Alvy Bimler suggestion to see if it will increase her energy level.. To have labs and see Dr Alvy Bimler in [facility] November

## 2013-12-09 ENCOUNTER — Ambulatory Visit: Payer: Medicare PPO | Admitting: Hematology and Oncology

## 2014-01-03 ENCOUNTER — Telehealth: Payer: Self-pay | Admitting: Hematology and Oncology

## 2014-01-03 ENCOUNTER — Other Ambulatory Visit (HOSPITAL_BASED_OUTPATIENT_CLINIC_OR_DEPARTMENT_OTHER): Payer: Medicare PPO

## 2014-01-03 ENCOUNTER — Encounter: Payer: Self-pay | Admitting: Hematology and Oncology

## 2014-01-03 ENCOUNTER — Ambulatory Visit (HOSPITAL_BASED_OUTPATIENT_CLINIC_OR_DEPARTMENT_OTHER): Payer: Medicare PPO | Admitting: Hematology and Oncology

## 2014-01-03 VITALS — BP 159/81 | HR 72 | Temp 97.9°F | Resp 18 | Ht 63.0 in | Wt 156.3 lb

## 2014-01-03 DIAGNOSIS — D589 Hereditary hemolytic anemia, unspecified: Secondary | ICD-10-CM

## 2014-01-03 LAB — HOLD TUBE, BLOOD BANK

## 2014-01-03 LAB — CBC & DIFF AND RETIC
BASO%: 0.2 % (ref 0.0–2.0)
Basophils Absolute: 0 10*3/uL (ref 0.0–0.1)
EOS ABS: 0.3 10*3/uL (ref 0.0–0.5)
EOS%: 7.7 % — ABNORMAL HIGH (ref 0.0–7.0)
HCT: 31.6 % — ABNORMAL LOW (ref 34.8–46.6)
HGB: 10.5 g/dL — ABNORMAL LOW (ref 11.6–15.9)
Immature Retic Fract: 6 % (ref 1.60–10.00)
LYMPH%: 12.2 % — ABNORMAL LOW (ref 14.0–49.7)
MCH: 31.9 pg (ref 25.1–34.0)
MCHC: 33.2 g/dL (ref 31.5–36.0)
MCV: 96 fL (ref 79.5–101.0)
MONO#: 0.3 10*3/uL (ref 0.1–0.9)
MONO%: 7 % (ref 0.0–14.0)
NEUT%: 72.9 % (ref 38.4–76.8)
NEUTROS ABS: 3.1 10*3/uL (ref 1.5–6.5)
Platelets: 193 10*3/uL (ref 145–400)
RBC: 3.29 10*6/uL — AB (ref 3.70–5.45)
RDW: 13.7 % (ref 11.2–14.5)
RETIC %: 2.35 % — AB (ref 0.70–2.10)
Retic Ct Abs: 77.32 10*3/uL (ref 33.70–90.70)
WBC: 4.3 10*3/uL (ref 3.9–10.3)
lymph#: 0.5 10*3/uL — ABNORMAL LOW (ref 0.9–3.3)

## 2014-01-03 NOTE — Assessment & Plan Note (Signed)
Screening tests for PNH was negative. 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 I recommend she takes high-dose folic acid and vitamin B12 supplements. I discussed with her the pros and cons of splenectomy but the patient declined further surgery at this point. I will continue prednisone taper and she is comfortable to remain at 2.5 mg daily that also treats a COPD. I will see her back in 3 months.

## 2014-01-03 NOTE — Progress Notes (Signed)
[facility] OFFICE PROGRESS NOTE  Nicole Huh, MD SUMMARY OF HEMATOLOGIC HISTORY:  This is a pleasant lady with background history of severe anemia. In July of 2013 she had bone marrow aspirate and biopsy that came back hypercellular but unremarkable. She was recommended to take iron supplements. Between November to December 2014, she had multiple hospitalization requiring blood transfusion. Iron supplement was discontinued and she was being observed. She was also placed on prednisone but did not respond to it. Osmotic fragility testing came back positive, suspicious for hemoglobinopathy such as spherocytosis. INTERVAL HISTORY: Nicole Meyer 73 y.o. female returns for further follow-up. She is feeling better. Complain of mild fatigue. The patient denies any recent signs or symptoms of bleeding such as spontaneous epistaxis, hematuria or hematochezia.  I have reviewed the past medical history, past surgical history, social history and family history with the patient and they are unchanged from previous note.  ALLERGIES:  is allergic to penicillins; brovana; and paxil.  MEDICATIONS:  Current Outpatient Prescriptions  Medication Sig Dispense Refill  . ALPRAZolam (XANAX) 0.5 MG tablet Take 0.5 mg by mouth daily.    [name] aspirin EC 81 MG tablet Take 81 mg by mouth daily.    . beta carotene w/minerals (OCUVITE) tablet Take 1 tablet by mouth daily.    . Calcium Carb-Cholecalciferol (CALCIUM 600 + D) 600-200 MG-UNIT TABS Take 1 tablet by mouth daily.    . calcium-vitamin D (OSCAL WITH D) 500-200 MG-UNIT per tablet Take 1 tablet by mouth 2 (two) times daily.    . Cranberry 500 MG CAPS Take 500 mg by mouth daily.    . folic acid (FOLVITE) 1 MG tablet Take 1 mg by mouth daily.    [name] HYDROcodone-acetaminophen (NORCO/VICODIN) 5-325 MG per tablet     . lisinopril (PRINIVIL) 10 MG tablet Take 1 tablet (10 mg total) by mouth daily. 30 tablet 2  . Multiple Vitamin (MULTIVITAMIN) tablet  Take 1 tablet by mouth daily.    . Nutritional Supplements (ESTROVEN PO) Take 1 tablet by mouth daily.    . predniSONE (DELTASONE) 2.5 MG tablet Take 1 tablet (2.5 mg total) by mouth daily with breakfast. 50 tablet 0  . ranitidine (ZANTAC) 150 MG tablet Take 150 mg by mouth daily.    . vitamin B-12 (CYANOCOBALAMIN) 500 MCG tablet Take 500 mcg by mouth daily.    . predniSONE (DELTASONE) 10 MG tablet Take 2 tablets (20 mg total) by mouth daily with breakfast. 90 tablet 1   No current facility-administered medications for this visit.     REVIEW OF SYSTEMS:   Constitutional: Denies fevers, chills or night sweats Eyes: Denies blurriness of vision Ears, nose, mouth, throat, and face: Denies mucositis or sore throat Respiratory: Denies cough, dyspnea or wheezes Cardiovascular: Denies palpitation, chest discomfort or lower extremity swelling Gastrointestinal:  Denies nausea, heartburn or change in bowel habits Skin: Denies abnormal skin rashes Lymphatics: Denies new lymphadenopathy or easy bruising Neurological:Denies numbness, tingling or new weaknesses Behavioral/Psych: Mood is stable, no new changes  All other systems were reviewed with the patient and are negative.  PHYSICAL EXAMINATION: ECOG PERFORMANCE STATUS: 1 - Symptomatic but completely ambulatory  Filed Vitals:   01/03/14 1145  BP: 159/81  Pulse: 72  Temp: 97.9 F (36.6 C)  Resp: 18   Filed Weights   01/03/14 1145  Weight: 156 lb 4.8 oz (70.897 kg)    GENERAL:alert, no distress and comfortable SKIN: skin color, texture, turgor are normal, no rashes or significant  lesions EYES: normal, Conjunctiva are pink and non-injected, sclera clear Musculoskeletal:no cyanosis of digits and no clubbing  NEURO: alert & oriented x 3 with fluent speech, no focal motor/sensory deficits  LABORATORY DATA:  I have reviewed the data as listed Results for orders placed or performed in visit on 01/03/14 (from the past 48 hour(s))  CBC &  Diff and Retic     Status: Abnormal   Collection Time: 01/03/14 11:26 AM  Result Value Ref Range   WBC 4.3 3.9 - 10.3 10e3/uL   NEUT# 3.1 1.5 - 6.5 10e3/uL   HGB 10.5 (L) 11.6 - 15.9 g/dL   HCT 31.6 (L) 34.8 - 46.6 %   Platelets 193 145 - 400 10e3/uL   MCV 96.0 79.5 - 101.0 fL   MCH 31.9 25.1 - 34.0 pg   MCHC 33.2 31.5 - 36.0 g/dL   RBC 3.29 (L) 3.70 - 5.45 10e6/uL   RDW 13.7 11.2 - 14.5 %   lymph# 0.5 (L) 0.9 - 3.3 10e3/uL   MONO# 0.3 0.1 - 0.9 10e3/uL   Eosinophils Absolute 0.3 0.0 - 0.5 10e3/uL   Basophils Absolute 0.0 0.0 - 0.1 10e3/uL   NEUT% 72.9 38.4 - 76.8 %   LYMPH% 12.2 (L) 14.0 - 49.7 %   MONO% 7.0 0.0 - 14.0 %   EOS% 7.7 (H) 0.0 - 7.0 %   BASO% 0.2 0.0 - 2.0 %   Retic % 2.35 (H) 0.70 - 2.10 %   Retic Ct Abs 77.32 33.70 - 90.70 10e3/uL   Immature Retic Fract 6.00 1.60 - 10.00 %  Hold Tube, Blood Bank     Status: None   Collection Time: 01/03/14 11:26 AM  Result Value Ref Range   Hold Tube, Blood Bank Blood Bank Order Cancelled     Lab Results  Component Value Date   WBC 4.3 01/03/2014   HGB 10.5* 01/03/2014   HCT 31.6* 01/03/2014   MCV 96.0 01/03/2014   PLT 193 01/03/2014    ASSESSMENT & PLAN:  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 I recommend she takes high-dose folic acid and vitamin B12 supplements. I discussed with her the pros and cons of splenectomy but the patient declined further surgery at this point. I will continue prednisone taper and she is comfortable to remain at 2.5 mg daily that also treats a COPD. I will see her back in 3 months.       All questions were answered. The patient knows to call the clinic with any problems, questions or concerns. No barriers to learning was detected.  I spent 15 minutes counseling the patient face to face. The total time spent in the appointment was 20 minutes and more than 50%  was on counseling.     [facility], [place], MD 01/03/2014 12:22 PM

## 2014-01-03 NOTE — Telephone Encounter (Signed)
Pt confirmed labs/ov per 11/12 POF, gave pt AVS...... KJ °

## 2014-03-03 ENCOUNTER — Encounter (HOSPITAL_COMMUNITY): Payer: Self-pay | Admitting: Gastroenterology

## 2014-04-04 ENCOUNTER — Telehealth: Payer: Self-pay | Admitting: Hematology and Oncology

## 2014-04-04 ENCOUNTER — Ambulatory Visit (HOSPITAL_BASED_OUTPATIENT_CLINIC_OR_DEPARTMENT_OTHER): Payer: Medicare PPO | Admitting: Hematology and Oncology

## 2014-04-04 ENCOUNTER — Other Ambulatory Visit (HOSPITAL_BASED_OUTPATIENT_CLINIC_OR_DEPARTMENT_OTHER): Payer: Medicare PPO

## 2014-04-04 VITALS — BP 157/82 | HR 79 | Temp 98.2°F | Resp 18 | Ht 63.0 in | Wt 163.2 lb

## 2014-04-04 DIAGNOSIS — D589 Hereditary hemolytic anemia, unspecified: Secondary | ICD-10-CM

## 2014-04-04 DIAGNOSIS — D596 Hemoglobinuria due to hemolysis from other external causes: Secondary | ICD-10-CM

## 2014-04-04 DIAGNOSIS — D599 Acquired hemolytic anemia, unspecified: Secondary | ICD-10-CM

## 2014-04-04 LAB — CBC & DIFF AND RETIC
BASO%: 0.5 % (ref 0.0–2.0)
BASOS ABS: 0 10*3/uL (ref 0.0–0.1)
EOS%: 5.2 % (ref 0.0–7.0)
Eosinophils Absolute: 0.3 10*3/uL (ref 0.0–0.5)
HEMATOCRIT: 35.2 % (ref 34.8–46.6)
HEMOGLOBIN: 11.6 g/dL (ref 11.6–15.9)
Immature Retic Fract: 7.8 % (ref 1.60–10.00)
LYMPH%: 12.8 % — ABNORMAL LOW (ref 14.0–49.7)
MCH: 31.9 pg (ref 25.1–34.0)
MCHC: 33 g/dL (ref 31.5–36.0)
MCV: 96.7 fL (ref 79.5–101.0)
MONO#: 0.4 10*3/uL (ref 0.1–0.9)
MONO%: 6.7 % (ref 0.0–14.0)
NEUT#: 4.4 10*3/uL (ref 1.5–6.5)
NEUT%: 74.8 % (ref 38.4–76.8)
Platelets: 228 10*3/uL (ref 145–400)
RBC: 3.64 10*6/uL — ABNORMAL LOW (ref 3.70–5.45)
RDW: 13.3 % (ref 11.2–14.5)
RETIC %: 2.79 % — AB (ref 0.70–2.10)
Retic Ct Abs: 101.56 10*3/uL — ABNORMAL HIGH (ref 33.70–90.70)
WBC: 5.9 10*3/uL (ref 3.9–10.3)
lymph#: 0.8 10*3/uL — ABNORMAL LOW (ref 0.9–3.3)

## 2014-04-04 LAB — HOLD TUBE, BLOOD BANK

## 2014-04-04 NOTE — Progress Notes (Signed)
[facility] OFFICE PROGRESS NOTE  [name], MD SUMMARY OF HEMATOLOGIC HISTORY:  This is a pleasant lady with background history of severe anemia. In July of 2013 she had bone marrow aspirate and biopsy that came back hypercellular but unremarkable. She was recommended to take iron supplements. Between November to December 2014, she had multiple hospitalization requiring blood transfusion. Iron supplement was discontinued and she was being observed. She was also placed on prednisone but did not respond to it. Osmotic fragility testing came back positive, suspicious for hemoglobinopathy such as spherocytosis. INTERVAL HISTORY: Nicole Meyer 74 y.o. female returns for further follow-up. She is compliant taking vitamin U-76 and folic acid supplement. She is currently at 5 mg of prednisone to treat COPD and joint pain. She has excellent energy level. The patient denies any recent signs or symptoms of bleeding such as spontaneous epistaxis, hematuria or hematochezia.   I have reviewed the past medical history, past surgical history, social history and family history with the patient and they are unchanged from previous note.  ALLERGIES:  is allergic to penicillins; brovana; and paxil.  MEDICATIONS:  Current Outpatient Prescriptions  Medication Sig Dispense Refill  . ALPRAZolam (XANAX) 0.5 MG tablet Take 0.5 mg by mouth daily.    [name] aspirin EC 81 MG tablet Take 81 mg by mouth daily.    . beta carotene w/minerals (OCUVITE) tablet Take 1 tablet by mouth daily.    . Calcium Carb-Cholecalciferol (CALCIUM 600 + D) 600-200 MG-UNIT TABS Take 1 tablet by mouth daily.    . calcium-vitamin D (OSCAL WITH D) 500-200 MG-UNIT per tablet Take 1 tablet by mouth 2 (two) times daily.    . Cranberry 500 MG CAPS Take 500 mg by mouth daily.    . folic acid (FOLVITE) 1 MG tablet Take 1 mg by mouth daily.    [name] lisinopril (PRINIVIL) 10 MG tablet Take 1 tablet (10 mg total) by mouth daily. 30  tablet 2  . Multiple Vitamin (MULTIVITAMIN) tablet Take 1 tablet by mouth daily.    . Nutritional Supplements (ESTROVEN PO) Take 1 tablet by mouth daily.    . predniSONE (DELTASONE) 10 MG tablet Take 2 tablets (20 mg total) by mouth daily with breakfast. (Patient taking differently: Take 5 mg by mouth daily with breakfast. ) 90 tablet 1  . ranitidine (ZANTAC) 150 MG tablet Take 150 mg by mouth daily.    . vitamin B-12 (CYANOCOBALAMIN) 500 MCG tablet Take 500 mcg by mouth daily.     No current facility-administered medications for this visit.     REVIEW OF SYSTEMS:   Constitutional: Denies fevers, chills or night sweats Eyes: Denies blurriness of vision Ears, nose, mouth, throat, and face: Denies mucositis or sore throat Respiratory: Denies cough, dyspnea or wheezes Cardiovascular: Denies palpitation, chest discomfort or lower extremity swelling Gastrointestinal:  Denies nausea, heartburn or change in bowel habits Skin: Denies abnormal skin rashes Lymphatics: Denies new lymphadenopathy or easy bruising Neurological:Denies numbness, tingling or new weaknesses Behavioral/Psych: Mood is stable, no new changes  All other systems were reviewed with the patient and are negative.  PHYSICAL EXAMINATION: ECOG PERFORMANCE STATUS: 0 - Asymptomatic  Filed Vitals:   04/04/14 1121  BP: 157/82  Pulse: 79  Temp: 98.2 F (36.8 C)  Resp: 18   Filed Weights   04/04/14 1121  Weight: 163 lb 3.2 oz (74.027 kg)    GENERAL:alert, no distress and comfortable SKIN: skin color, texture, turgor are normal, no rashes or significant lesions  EYES: normal, Conjunctiva are pink and non-injected, sclera clear Musculoskeletal:no cyanosis of digits and no clubbing  NEURO: alert & oriented x 3 with fluent speech, no focal motor/sensory deficits  LABORATORY DATA:  I have reviewed the data as listed Results for orders placed or performed in visit on 04/04/14 (from the past 48 hour(s))  CBC & Diff and Retic      Status: Abnormal   Collection Time: 04/04/14 11:00 AM  Result Value Ref Range   WBC 5.9 3.9 - 10.3 10e3/uL   NEUT# 4.4 1.5 - 6.5 10e3/uL   HGB 11.6 11.6 - 15.9 g/dL   HCT 35.2 34.8 - 46.6 %   Platelets 228 145 - 400 10e3/uL   MCV 96.7 79.5 - 101.0 fL   MCH 31.9 25.1 - 34.0 pg   MCHC 33.0 31.5 - 36.0 g/dL   RBC 3.64 (L) 3.70 - 5.45 10e6/uL   RDW 13.3 11.2 - 14.5 %   lymph# 0.8 (L) 0.9 - 3.3 10e3/uL   MONO# 0.4 0.1 - 0.9 10e3/uL   Eosinophils Absolute 0.3 0.0 - 0.5 10e3/uL   Basophils Absolute 0.0 0.0 - 0.1 10e3/uL   NEUT% 74.8 38.4 - 76.8 %   LYMPH% 12.8 (L) 14.0 - 49.7 %   MONO% 6.7 0.0 - 14.0 %   EOS% 5.2 0.0 - 7.0 %   BASO% 0.5 0.0 - 2.0 %   Retic % 2.79 (H) 0.70 - 2.10 %   Retic Ct Abs 101.56 (H) 33.70 - 90.70 10e3/uL   Immature Retic Fract 7.80 1.60 - 10.00 %    Lab Results  Component Value Date   WBC 5.9 04/04/2014   HGB 11.6 04/04/2014   HCT 35.2 04/04/2014   MCV 96.7 04/04/2014   PLT 228 04/04/2014    ASSESSMENT & PLAN:  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 I recommend she takes high-dose folic acid and vitamin B12 supplements. I discussed with her the pros and cons of splenectomy but the patient declined further surgery at this point. I will continue prednisone at 5 mg daily that also treats a COPD. I will see her back in 4 months. I plan to taper prednisone further in the future if her anemia resolves.       All questions were answered. The patient knows to call the clinic with any problems, questions or concerns. No barriers to learning was detected.  I spent 15 minutes counseling the patient face to face. The total time spent in the appointment was 20 minutes and more than 50% was on counseling.     [facility], Seher Schlagel, MD 2/15/201611:48 AM

## 2014-04-04 NOTE — Telephone Encounter (Signed)
Gave avs & calendar for June. °

## 2014-04-04 NOTE — Assessment & Plan Note (Signed)
Screening tests for PNH was negative. 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 I recommend she takes high-dose folic acid and vitamin B12 supplements. I discussed with her the pros and cons of splenectomy but the patient declined further surgery at this point. I will continue prednisone at 5 mg daily that also treats a COPD. I will see her back in 4 months. I plan to taper prednisone further in the future if her anemia resolves.

## 2014-08-01 ENCOUNTER — Encounter: Payer: Self-pay | Admitting: Hematology and Oncology

## 2014-08-01 ENCOUNTER — Other Ambulatory Visit (HOSPITAL_BASED_OUTPATIENT_CLINIC_OR_DEPARTMENT_OTHER): Payer: Medicare PPO

## 2014-08-01 ENCOUNTER — Ambulatory Visit (HOSPITAL_BASED_OUTPATIENT_CLINIC_OR_DEPARTMENT_OTHER): Payer: Medicare PPO | Admitting: Hematology and Oncology

## 2014-08-01 ENCOUNTER — Telehealth: Payer: Self-pay | Admitting: Hematology and Oncology

## 2014-08-01 VITALS — BP 158/70 | HR 79 | Temp 98.1°F | Resp 18 | Ht 63.0 in | Wt 166.7 lb

## 2014-08-01 DIAGNOSIS — J449 Chronic obstructive pulmonary disease, unspecified: Secondary | ICD-10-CM | POA: Diagnosis not present

## 2014-08-01 DIAGNOSIS — D589 Hereditary hemolytic anemia, unspecified: Secondary | ICD-10-CM

## 2014-08-01 DIAGNOSIS — D596 Hemoglobinuria due to hemolysis from other external causes: Secondary | ICD-10-CM

## 2014-08-01 DIAGNOSIS — J438 Other emphysema: Secondary | ICD-10-CM

## 2014-08-01 LAB — CBC & DIFF AND RETIC
BASO%: 0.3 % (ref 0.0–2.0)
Basophils Absolute: 0 10*3/uL (ref 0.0–0.1)
EOS%: 7.5 % — AB (ref 0.0–7.0)
Eosinophils Absolute: 0.5 10*3/uL (ref 0.0–0.5)
HCT: 30.3 % — ABNORMAL LOW (ref 34.8–46.6)
HGB: 10.1 g/dL — ABNORMAL LOW (ref 11.6–15.9)
IMMATURE RETIC FRACT: 9.1 % (ref 1.60–10.00)
LYMPH#: 0.5 10*3/uL — AB (ref 0.9–3.3)
LYMPH%: 8.3 % — ABNORMAL LOW (ref 14.0–49.7)
MCH: 32 pg (ref 25.1–34.0)
MCHC: 33.3 g/dL (ref 31.5–36.0)
MCV: 95.9 fL (ref 79.5–101.0)
MONO#: 0.3 10*3/uL (ref 0.1–0.9)
MONO%: 5.5 % (ref 0.0–14.0)
NEUT%: 78.4 % — ABNORMAL HIGH (ref 38.4–76.8)
NEUTROS ABS: 4.8 10*3/uL (ref 1.5–6.5)
Platelets: 190 10*3/uL (ref 145–400)
RBC: 3.16 10*6/uL — ABNORMAL LOW (ref 3.70–5.45)
RDW: 13.1 % (ref 11.2–14.5)
RETIC CT ABS: 71.42 10*3/uL (ref 33.70–90.70)
Retic %: 2.26 % — ABNORMAL HIGH (ref 0.70–2.10)
WBC: 6.1 10*3/uL (ref 3.9–10.3)

## 2014-08-01 LAB — COMPREHENSIVE METABOLIC PANEL (CC13)
ALBUMIN: 3.5 g/dL (ref 3.5–5.0)
ALT: 15 U/L (ref 0–55)
ANION GAP: 9 meq/L (ref 3–11)
AST: 18 U/L (ref 5–34)
Alkaline Phosphatase: 72 U/L (ref 40–150)
BILIRUBIN TOTAL: 1.12 mg/dL (ref 0.20–1.20)
BUN: 21.4 mg/dL (ref 7.0–26.0)
CALCIUM: 9.6 mg/dL (ref 8.4–10.4)
CO2: 23 mEq/L (ref 22–29)
Chloride: 107 mEq/L (ref 98–109)
Creatinine: 1.3 mg/dL — ABNORMAL HIGH (ref 0.6–1.1)
EGFR: 42 mL/min/{1.73_m2} — ABNORMAL LOW (ref >90)
GLUCOSE: 122 mg/dL (ref 70–140)
POTASSIUM: 4.6 meq/L (ref 3.5–5.1)
Sodium: 139 mEq/L (ref 136–145)
TOTAL PROTEIN: 6.8 g/dL (ref 6.4–8.3)

## 2014-08-01 LAB — HOLD TUBE, BLOOD BANK

## 2014-08-01 LAB — LACTATE DEHYDROGENASE (CC13): LDH: 213 U/L (ref 125–245)

## 2014-08-01 MED ORDER — PREDNISONE 10 MG PO TABS: 20.0000 mg | ORAL_TABLET | Freq: Every day | ORAL | Status: DC

## 2014-08-01 NOTE — Telephone Encounter (Signed)
Gave patient avs report and appointments for October  °

## 2014-08-01 NOTE — Progress Notes (Signed)
[facility] OFFICE PROGRESS NOTE  [name], MD SUMMARY OF HEMATOLOGIC HISTORY:  This is a pleasant lady with background history of severe anemia. In July of 2013 she had bone marrow aspirate and biopsy that came back hypercellular but unremarkable. She was recommended to take iron supplements. Between November to December 2014, she had multiple hospitalization requiring blood transfusion. Iron supplement was discontinued and she was being observed. She was also placed on prednisone. Osmotic fragility testing came back positive, suspicious for hemoglobinopathy such as spherocytosis.  INTERVAL HISTORY: Nicole Meyer 74 y.o. female returns for further follow-up. She complained of fatigue. She have 2 pillows orthopnea, diffuse expiratory wheezes and nonproductive cough for about a week. She denies recent smoking. She restarted taking a regular inhaler. She denies fevers or chills. The patient denies any recent signs or symptoms of bleeding such as spontaneous epistaxis, hematuria or hematochezia.   I have reviewed the past medical history, past surgical history, social history and family history with the patient and they are unchanged from previous note.  ALLERGIES:  is allergic to penicillins; brovana; and paxil.  MEDICATIONS:  Current Outpatient Prescriptions  Medication Sig Dispense Refill  . ALPRAZolam (XANAX) 0.5 MG tablet Take 0.5 mg by mouth daily.    [name] aspirin EC 81 MG tablet Take 81 mg by mouth daily.    . beta carotene w/minerals (OCUVITE) tablet Take 1 tablet by mouth daily.    . calcium-vitamin D (OSCAL WITH D) 500-200 MG-UNIT per tablet Take 1 tablet by mouth 2 (two) times daily.    . Cranberry 500 MG CAPS Take 500 mg by mouth daily.    . folic acid (FOLVITE) 1 MG tablet Take 1 mg by mouth daily.    [name] lisinopril (PRINIVIL) 10 MG tablet Take 1 tablet (10 mg total) by mouth daily. 30 tablet 2  . Multiple Vitamin (MULTIVITAMIN) tablet Take 1 tablet by  mouth daily.    . Nutritional Supplements (ESTROVEN PO) Take 1 tablet by mouth daily.    . ondansetron (ZOFRAN-ODT) 4 MG disintegrating tablet     . predniSONE (DELTASONE) 10 MG tablet Take 2 tablets (20 mg total) by mouth daily with breakfast. 90 tablet 1  . ranitidine (ZANTAC) 150 MG tablet Take 150 mg by mouth daily.    . vitamin B-12 (CYANOCOBALAMIN) 500 MCG tablet Take 500 mcg by mouth daily.     No current facility-administered medications for this visit.     REVIEW OF SYSTEMS:   Constitutional: Denies fevers, chills or night sweats Eyes: Denies blurriness of vision Ears, nose, mouth, throat, and face: Denies mucositis or sore throat Respiratory: Denies cough, dyspnea or wheezes Cardiovascular: Denies palpitation, chest discomfort or lower extremity swelling Gastrointestinal:  Denies nausea, heartburn or change in bowel habits Skin: Denies abnormal skin rashes Lymphatics: Denies new lymphadenopathy or easy bruising Neurological:Denies numbness, tingling or new weaknesses Behavioral/Psych: Mood is stable, no new changes  All other systems were reviewed with the patient and are negative.  PHYSICAL EXAMINATION: ECOG PERFORMANCE STATUS: 1 - Symptomatic but completely ambulatory  Filed Vitals:   08/01/14 1045  BP: 158/70  Pulse: 79  Temp: 98.1 F (36.7 C)  Resp: 18   Filed Weights   08/01/14 1045  Weight: 166 lb 11.2 oz (75.615 kg)    GENERAL:alert, no distress and comfortable SKIN: skin color, texture, turgor are normal, no rashes or significant lesions EYES: normal, Conjunctiva are pink and non-injected, sclera clear OROPHARYNX:no exudate, no erythema and lips, buccal mucosa,  and tongue normal  NECK: supple, thyroid normal size, non-tender, without nodularity LYMPH:  no palpable lymphadenopathy in the cervical, axillary or inguinal LUNGS: She has diffuse expiratory wheezes. No crackles at the lung bases. No increased work of breathing HEART: regular rate & rhythm and  no murmurs and no lower extremity edema ABDOMEN:abdomen soft, non-tender and normal bowel sounds Musculoskeletal:no cyanosis of digits and no clubbing  NEURO: alert & oriented x 3 with fluent speech, no focal motor/sensory deficits  LABORATORY DATA:  I have reviewed the data as listed Results for orders placed or performed in visit on 08/01/14 (from the past 48 hour(s))  CBC & Diff and Retic     Status: Abnormal   Collection Time: 08/01/14 10:30 AM  Result Value Ref Range   WBC 6.1 3.9 - 10.3 10e3/uL   NEUT# 4.8 1.5 - 6.5 10e3/uL   HGB 10.1 (L) 11.6 - 15.9 g/dL   HCT 30.3 (L) 34.8 - 46.6 %   Platelets 190 145 - 400 10e3/uL   MCV 95.9 79.5 - 101.0 fL   MCH 32.0 25.1 - 34.0 pg   MCHC 33.3 31.5 - 36.0 g/dL   RBC 3.16 (L) 3.70 - 5.45 10e6/uL   RDW 13.1 11.2 - 14.5 %   lymph# 0.5 (L) 0.9 - 3.3 10e3/uL   MONO# 0.3 0.1 - 0.9 10e3/uL   Eosinophils Absolute 0.5 0.0 - 0.5 10e3/uL   Basophils Absolute 0.0 0.0 - 0.1 10e3/uL   NEUT% 78.4 (H) 38.4 - 76.8 %   LYMPH% 8.3 (L) 14.0 - 49.7 %   MONO% 5.5 0.0 - 14.0 %   EOS% 7.5 (H) 0.0 - 7.0 %   BASO% 0.3 0.0 - 2.0 %   Retic % 2.26 (H) 0.70 - 2.10 %   Retic Ct Abs 71.42 33.70 - 90.70 10e3/uL   Immature Retic Fract 9.10 1.60 - 10.00 %  Hold Tube, Blood Bank     Status: None   Collection Time: 08/01/14 10:30 AM  Result Value Ref Range   Hold Tube, Blood Bank Blood Bank Order Cancelled     Lab Results  Component Value Date   WBC 6.1 08/01/2014   HGB 10.1* 08/01/2014   HCT 30.3* 08/01/2014   MCV 95.9 08/01/2014   PLT 190 08/01/2014    ASSESSMENT & PLAN:  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 I recommend she takes high-dose folic acid and vitamin B12 supplements. I discussed with her the pros and cons of splenectomy but the patient declined further surgery at this point. I will continue prednisone at 5 mg daily  that also treats a COPD. I will see her back in 4 months.  COPD (chronic obstructive pulmonary disease) She has diffuse expiratory wheezes. She has restarted back on regular inhalers I recommend she increase prednisone to 20 mg or 1 week and then taper back to 5 mg. She will continue on proton pump inhibitor on Zantac for GI prophylaxis. Clinically, it does not appear that she have any form of active infection that were ordered anti-biotic therapy. If she does not improve by next week, I recommend she follows with primary care doctor for further follow-up.   All questions were answered. The patient knows to call the clinic with any problems, questions or concerns. No barriers to learning was detected.  I spent 15 minutes counseling the patient face to face. The total time spent in the appointment was  20 minutes and more than 50% was on counseling.     [facility] [address], Tj Kitchings, MD 6/13/201611:17 AM

## 2014-08-01 NOTE — Assessment & Plan Note (Signed)
She has diffuse expiratory wheezes. She has restarted back on regular inhalers I recommend she increase prednisone to 20 mg or 1 week and then taper back to 5 mg. She will continue on proton pump inhibitor on Zantac for GI prophylaxis. Clinically, it does not appear that she have any form of active infection that were ordered anti-biotic therapy. If she does not improve by next week, I recommend she follows with primary care doctor for further follow-up.

## 2014-08-01 NOTE — Assessment & Plan Note (Signed)
Screening tests for PNH was negative. 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 I recommend she takes high-dose folic acid and vitamin B12 supplements. I discussed with her the pros and cons of splenectomy but the patient declined further surgery at this point. I will continue prednisone at 5 mg daily that also treats a COPD. I will see her back in 4 months.

## 2014-11-16 DIAGNOSIS — M170 Bilateral primary osteoarthritis of knee: Secondary | ICD-10-CM | POA: Diagnosis not present

## 2014-11-16 DIAGNOSIS — M1712 Unilateral primary osteoarthritis, left knee: Secondary | ICD-10-CM | POA: Diagnosis not present

## 2014-11-16 DIAGNOSIS — M1711 Unilateral primary osteoarthritis, right knee: Secondary | ICD-10-CM | POA: Diagnosis not present

## 2014-11-28 ENCOUNTER — Ambulatory Visit (HOSPITAL_BASED_OUTPATIENT_CLINIC_OR_DEPARTMENT_OTHER): Payer: Medicare PPO | Admitting: Hematology and Oncology

## 2014-11-28 ENCOUNTER — Encounter: Payer: Self-pay | Admitting: Hematology and Oncology

## 2014-11-28 ENCOUNTER — Other Ambulatory Visit (HOSPITAL_BASED_OUTPATIENT_CLINIC_OR_DEPARTMENT_OTHER): Payer: Medicare PPO

## 2014-11-28 ENCOUNTER — Telehealth: Payer: Self-pay | Admitting: Hematology and Oncology

## 2014-11-28 VITALS — BP 172/72 | HR 70 | Temp 97.6°F | Resp 20 | Ht 63.0 in | Wt 169.6 lb

## 2014-11-28 DIAGNOSIS — D589 Hereditary hemolytic anemia, unspecified: Secondary | ICD-10-CM

## 2014-11-28 DIAGNOSIS — Z23 Encounter for immunization: Secondary | ICD-10-CM | POA: Diagnosis not present

## 2014-11-28 DIAGNOSIS — J449 Chronic obstructive pulmonary disease, unspecified: Secondary | ICD-10-CM

## 2014-11-28 DIAGNOSIS — J438 Other emphysema: Secondary | ICD-10-CM

## 2014-11-28 DIAGNOSIS — R5382 Chronic fatigue, unspecified: Secondary | ICD-10-CM

## 2014-11-28 DIAGNOSIS — D594 Other nonautoimmune hemolytic anemias: Secondary | ICD-10-CM

## 2014-11-28 LAB — CBC & DIFF AND RETIC
BASO%: 0.1 % (ref 0.0–2.0)
Basophils Absolute: 0 10*3/uL (ref 0.0–0.1)
EOS%: 2.1 % (ref 0.0–7.0)
Eosinophils Absolute: 0.2 10*3/uL (ref 0.0–0.5)
HCT: 34.8 % (ref 34.8–46.6)
HGB: 11.5 g/dL — ABNORMAL LOW (ref 11.6–15.9)
Immature Retic Fract: 7.7 % (ref 1.60–10.00)
LYMPH%: 8.1 % — AB (ref 14.0–49.7)
MCH: 32.1 pg (ref 25.1–34.0)
MCHC: 33 g/dL (ref 31.5–36.0)
MCV: 97.2 fL (ref 79.5–101.0)
MONO#: 0.5 10*3/uL (ref 0.1–0.9)
MONO%: 6.5 % (ref 0.0–14.0)
NEUT#: 5.9 10*3/uL (ref 1.5–6.5)
NEUT%: 83.2 % — AB (ref 38.4–76.8)
PLATELETS: 196 10*3/uL (ref 145–400)
RBC: 3.58 10*6/uL — AB (ref 3.70–5.45)
RDW: 12.9 % (ref 11.2–14.5)
Retic %: 1.98 % (ref 0.70–2.10)
Retic Ct Abs: 70.88 10*3/uL (ref 33.70–90.70)
WBC: 7.1 10*3/uL (ref 3.9–10.3)
lymph#: 0.6 10*3/uL — ABNORMAL LOW (ref 0.9–3.3)

## 2014-11-28 MED ORDER — INFLUENZA VAC SPLIT QUAD 0.5 ML IM SUSY
0.5000 mL | PREFILLED_SYRINGE | Freq: Once | INTRAMUSCULAR | Status: AC
  Administered 2014-11-28: 0.5 mL via INTRAMUSCULAR
  Filled 2014-11-28: qty 0.5

## 2014-11-28 NOTE — Assessment & Plan Note (Signed)
She will continue on proton pump inhibitor on Zantac for GI prophylaxis. Her cough has resolved since the last time I saw her and the prednisone appears to be helping with her wheezes. We will continue the same. We discussed the importance of preventive care and reviewed the vaccination programs. She does not have any prior allergic reactions to influenza vaccination. She agrees to proceed with influenza vaccination today and we will administer it today at the clinic.

## 2014-11-28 NOTE — Progress Notes (Signed)
[facility] OFFICE PROGRESS NOTE  [name], MD SUMMARY OF HEMATOLOGIC HISTORY:  This is a pleasant lady with background history of severe anemia. In July of 2013 she had bone marrow aspirate and biopsy that came back hypercellular but unremarkable. She was recommended to take iron supplements. Between November to December 2014, she had multiple hospitalization requiring blood transfusion. Iron supplement was discontinued and she was being observed. She was also placed on prednisone. Osmotic fragility testing came back positive, suspicious for hemoglobinopathy such as spherocytosis. Since June 2016, she was maintained on prednisone 5 mg daily INTERVAL HISTORY: Nicole Meyer 74 y.o. female returns for further follow-up. She denies recent infection. She complained of chronic fatigue. The patient denies any recent signs or symptoms of bleeding such as spontaneous epistaxis, hematuria or hematochezia. Denies joint pain.  I have reviewed the past medical history, past surgical history, social history and family history with the patient and they are unchanged from previous note.  ALLERGIES:  is allergic to penicillins; brovana; and paxil.  MEDICATIONS:  Current Outpatient Prescriptions  Medication Sig Dispense Refill  . ALPRAZolam (XANAX) 0.5 MG tablet Take 0.5 mg by mouth daily.    [name] aspirin EC 81 MG tablet Take 81 mg by mouth daily.    . beta carotene w/minerals (OCUVITE) tablet Take 1 tablet by mouth daily.    . calcium-vitamin D (OSCAL WITH D) 500-200 MG-UNIT per tablet Take 1 tablet by mouth 2 (two) times daily.    . Cranberry 500 MG CAPS Take 500 mg by mouth daily.    . folic acid (FOLVITE) 1 MG tablet Take 1 mg by mouth daily.    [name] lisinopril (PRINIVIL) 10 MG tablet Take 1 tablet (10 mg total) by mouth daily. 30 tablet 2  . Multiple Vitamin (MULTIVITAMIN) tablet Take 1 tablet by mouth daily.    . Nutritional Supplements (ESTROVEN PO) Take 1 tablet by mouth  daily.    . ondansetron (ZOFRAN-ODT) 4 MG disintegrating tablet     . predniSONE (DELTASONE) 10 MG tablet Take 2 tablets (20 mg total) by mouth daily with breakfast. (Patient taking differently: Take 10 mg by mouth daily with breakfast. ) 90 tablet 1  . ranitidine (ZANTAC) 150 MG tablet Take 150 mg by mouth daily.    . vitamin B-12 (CYANOCOBALAMIN) 500 MCG tablet Take 500 mcg by mouth daily.     No current facility-administered medications for this visit.     REVIEW OF SYSTEMS:   Constitutional: Denies fevers, chills or night sweats Eyes: Denies blurriness of vision Ears, nose, mouth, throat, and face: Denies mucositis or sore throat Respiratory: Denies cough, dyspnea or wheezes Cardiovascular: Denies palpitation, chest discomfort or lower extremity swelling Gastrointestinal:  Denies nausea, heartburn or change in bowel habits Skin: Denies abnormal skin rashes Lymphatics: Denies new lymphadenopathy or easy bruising Neurological:Denies numbness, tingling or new weaknesses Behavioral/Psych: Mood is stable, no new changes  All other systems were reviewed with the patient and are negative.  PHYSICAL EXAMINATION: ECOG PERFORMANCE STATUS: 0 - Asymptomatic  Filed Vitals:   11/28/14 1046  BP: 172/72  Pulse: 70  Temp: 97.6 F (36.4 C)  Resp: 20   Filed Weights   11/28/14 1046  Weight: 169 lb 9.6 oz (76.93 kg)    GENERAL:alert, no distress and comfortable SKIN: skin color, texture, turgor are normal, no rashes or significant lesions. Noted thin skin and skin bruises EYES: normal, Conjunctiva are pink and non-injected, sclera clear Musculoskeletal:no cyanosis of digits and no  clubbing  NEURO: alert & oriented x 3 with fluent speech, no focal motor/sensory deficits  LABORATORY DATA:  I have reviewed the data as listed Results for orders placed or performed in visit on 11/28/14 (from the past 48 hour(s))  CBC & Diff and Retic     Status: Abnormal   Collection Time: 11/28/14 10:37  AM  Result Value Ref Range   WBC 7.1 3.9 - 10.3 10e3/uL   NEUT# 5.9 1.5 - 6.5 10e3/uL   HGB 11.5 (L) 11.6 - 15.9 g/dL   HCT 34.8 34.8 - 46.6 %   Platelets 196 145 - 400 10e3/uL   MCV 97.2 79.5 - 101.0 fL   MCH 32.1 25.1 - 34.0 pg   MCHC 33.0 31.5 - 36.0 g/dL   RBC 3.58 (L) 3.70 - 5.45 10e6/uL   RDW 12.9 11.2 - 14.5 %   lymph# 0.6 (L) 0.9 - 3.3 10e3/uL   MONO# 0.5 0.1 - 0.9 10e3/uL   Eosinophils Absolute 0.2 0.0 - 0.5 10e3/uL   Basophils Absolute 0.0 0.0 - 0.1 10e3/uL   NEUT% 83.2 (H) 38.4 - 76.8 %   LYMPH% 8.1 (L) 14.0 - 49.7 %   MONO% 6.5 0.0 - 14.0 %   EOS% 2.1 0.0 - 7.0 %   BASO% 0.1 0.0 - 2.0 %   Retic % 1.98 0.70 - 2.10 %   Retic Ct Abs 70.88 33.70 - 90.70 10e3/uL   Immature Retic Fract 7.70 1.60 - 10.00 %    Lab Results  Component Value Date   WBC 7.1 11/28/2014   HGB 11.5* 11/28/2014   HCT 34.8 11/28/2014   MCV 97.2 11/28/2014   PLT 196 11/28/2014   ASSESSMENT & PLAN:  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 I recommend she takes high-dose folic acid and vitamin B12 supplements. I discussed with her the pros and cons of splenectomy but the patient declined further surgery at this point. I will continue prednisone at 5 mg daily that also treats a COPD.   her blood count appears to be responding to this. I will see her back in 6 months.    Chronic fatigue She has chronic fatigue which I suspect is due to an element of deconditioning. I recommend graduated exercise as tolerated. I will check a TSH level next year when I see her back.  COPD (chronic obstructive pulmonary disease) She will continue on proton pump inhibitor on Zantac for GI prophylaxis. Her cough has resolved since the last time I saw her and the prednisone appears to be helping with her wheezes. We will continue the same. We discussed the importance of preventive care  and reviewed the vaccination programs. She does not have any prior allergic reactions to influenza vaccination. She agrees to proceed with influenza vaccination today and we will administer it today at the clinic.    All questions were answered. The patient knows to call the clinic with any problems, questions or concerns. No barriers to learning was detected.  I spent 15 minutes counseling the patient face to face. The total time spent in the appointment was 20 minutes and more than 50% was on counseling.     Abreanna Drawdy, MD 10/10/201611:47 AM

## 2014-11-28 NOTE — Assessment & Plan Note (Signed)
Screening tests for PNH was negative. 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 I recommend she takes high-dose folic acid and vitamin B12 supplements. I discussed with her the pros and cons of splenectomy but the patient declined further surgery at this point. I will continue prednisone at 5 mg daily that also treats a COPD.   her blood count appears to be responding to this. I will see her back in 6 months.

## 2014-11-28 NOTE — Telephone Encounter (Signed)
Gave and printed appt sched and avs for pt for April °

## 2014-11-28 NOTE — Assessment & Plan Note (Signed)
She has chronic fatigue which I suspect is due to an element of deconditioning. I recommend graduated exercise as tolerated. I will check a TSH level next year when I see her back.

## 2014-12-22 ENCOUNTER — Other Ambulatory Visit: Payer: Self-pay

## 2014-12-22 DIAGNOSIS — Z1231 Encounter for screening mammogram for malignant neoplasm of breast: Secondary | ICD-10-CM

## 2015-01-04 DIAGNOSIS — M353 Polymyalgia rheumatica: Secondary | ICD-10-CM | POA: Diagnosis not present

## 2015-01-04 DIAGNOSIS — K219 Gastro-esophageal reflux disease without esophagitis: Secondary | ICD-10-CM | POA: Diagnosis not present

## 2015-01-04 DIAGNOSIS — M1990 Unspecified osteoarthritis, unspecified site: Secondary | ICD-10-CM | POA: Diagnosis not present

## 2015-01-04 DIAGNOSIS — E7800 Pure hypercholesterolemia, unspecified: Secondary | ICD-10-CM | POA: Diagnosis not present

## 2015-01-04 DIAGNOSIS — J45909 Unspecified asthma, uncomplicated: Secondary | ICD-10-CM | POA: Diagnosis not present

## 2015-01-04 DIAGNOSIS — M4806 Spinal stenosis, lumbar region: Secondary | ICD-10-CM | POA: Diagnosis not present

## 2015-01-04 DIAGNOSIS — F419 Anxiety disorder, unspecified: Secondary | ICD-10-CM | POA: Diagnosis not present

## 2015-01-04 DIAGNOSIS — N183 Chronic kidney disease, stage 3 (moderate): Secondary | ICD-10-CM | POA: Diagnosis not present

## 2015-01-04 DIAGNOSIS — I129 Hypertensive chronic kidney disease with stage 1 through stage 4 chronic kidney disease, or unspecified chronic kidney disease: Secondary | ICD-10-CM | POA: Diagnosis not present

## 2015-01-05 ENCOUNTER — Ambulatory Visit
Admission: RE | Admit: 2015-01-05 | Discharge: 2015-01-05 | Disposition: A | Discharge status: Home / Self Care | Payer: Medicare PPO | Source: Ambulatory Visit

## 2015-01-05 DIAGNOSIS — Z1231 Encounter for screening mammogram for malignant neoplasm of breast: Secondary | ICD-10-CM

## 2015-01-09 ENCOUNTER — Other Ambulatory Visit: Payer: Self-pay | Admitting: Family Medicine

## 2015-01-09 DIAGNOSIS — R928 Other abnormal and inconclusive findings on diagnostic imaging of breast: Secondary | ICD-10-CM

## 2015-01-18 ENCOUNTER — Ambulatory Visit
Admission: RE | Admit: 2015-01-18 | Discharge: 2015-01-18 | Disposition: A | Discharge status: Home / Self Care | Payer: Medicare PPO | Source: Ambulatory Visit | Attending: Family Medicine | Admitting: Family Medicine

## 2015-01-18 DIAGNOSIS — R928 Other abnormal and inconclusive findings on diagnostic imaging of breast: Secondary | ICD-10-CM | POA: Diagnosis not present

## 2015-01-25 DIAGNOSIS — M170 Bilateral primary osteoarthritis of knee: Secondary | ICD-10-CM | POA: Diagnosis not present

## 2015-02-01 DIAGNOSIS — M170 Bilateral primary osteoarthritis of knee: Secondary | ICD-10-CM | POA: Diagnosis not present

## 2015-02-08 DIAGNOSIS — M170 Bilateral primary osteoarthritis of knee: Secondary | ICD-10-CM | POA: Diagnosis not present

## 2015-05-04 NOTE — H&P (Signed)
Nicole Meyer is an 75 y.o. female.    Chief Complaint: right knee pain  HPI: Pt is a 75 y.o. female complaining of right knee pain for multiple years. Pain had continually increased since the beginning. X-rays in the clinic show end-stage arthritic changes of the right knee. Pt has tried various conservative treatments which have failed to alleviate their symptoms, including injections and therapy. Various options are discussed with the patient. Risks, benefits and expectations were discussed with the patient. Patient understand the risks, benefits and expectations and wishes to proceed with surgery.   PCP:  [name], MD  D/C Plans: Home  PMH: Past Medical History  Diagnosis Date  . Leukocytopenia   . HTN (hypertension)   . Asthma   . IBS (irritable bowel syndrome)   . Anxiety   . Depression   . Chronic kidney disease (CKD), stage III (moderate)   . Hypercalcemia   . Fatigue   . Lymphopenia 08/23/2011  . COPD (chronic obstructive pulmonary disease) ([place])   . Renal insufficiency   . UTI (urinary tract infection) 12/26/2011  . Syncope and collapse 12/24/2011    "loss of consciousness for 3-4 min" (12/26/2011)  . Hypercholesteremia     "above borderline; I don't take RX for it" (12/26/2011)  . H/O hiatal hernia   . GERD (gastroesophageal reflux disease)   . Osteoporosis   . 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 Iron deficiency anemia     [facility]  . History of blood transfusion     "2 units in 12/2012; suppose to get 1 unit today" (02/12/2013)  . Arthritis     "knees and back" (02/12/2013)  . Polymyalgia ([place])   . Gout 04/05/2013  . Hyperbilirubinemia 05/03/2013  . PMR (polymyalgia rheumatica) ([facility])     pmr  . IBS (irritable bowel syndrome)     ibs  . Arthritis   . Anxiety   . Depression   . Lumbar spinal stenosis   . Osteoarthritis   . Diverticulitis     PSH: Past Surgical History  Procedure  Laterality Date  . Esophagogastroduodenoscopy  12/26/2011    Procedure: ESOPHAGOGASTRODUODENOSCOPY (EGD);  Surgeon: Inda Castle, MD;  Location: [facility];  Service: Endoscopy;  Laterality: N/A;  . Breast surgery  04-06-12    rt. lumpectomy_benign  . Laparoscopic nissen fundoplication N/A [id number]    Procedure: Laparoscopic Reduction and Repair of Paraesophagel Hiatal Hernia with Nissen;  Surgeon: Adin Hector, MD;  Location: [facility];  Service: General;  Laterality: N/A;  Laparoscopic Reduction and Repair of Paraesophagel Hiatal Hernia with Nissen  . Hernia repair  1970's    "stomach"   . Vaginal hysterectomy  ~ 1980    right ovary  . Tubal ligation  1970's  . Arthroscopic knee surgery-left    . Nissen fundoplication      [id number]  . Colonoscopy, esophagogastroduodenoscopy (egd) and esophageal dilation      06/2013    Social History:  reports that she quit smoking about 15 years ago. Her smoking use included Cigarettes. She has a 40 pack-year smoking history. She has never used smokeless tobacco. She reports that she does not drink alcohol or use illicit drugs.  Allergies:  Allergies  Allergen Reactions  . Penicillins Hives  . Brovana [Arformoterol] Other (See Comments)    "makes me nervous"  . Paxil [Paroxetine Hcl] Rash    Medications: No current facility-administered medications for  this encounter.   Current Outpatient Prescriptions  Medication Sig Dispense Refill  . ALPRAZolam (XANAX) 0.5 MG tablet Take 0.5 mg by mouth daily.    [name] aspirin EC 81 MG tablet Take 81 mg by mouth daily.    . beta carotene w/minerals (OCUVITE) tablet Take 1 tablet by mouth daily.    . calcium-vitamin D (OSCAL WITH D) 500-200 MG-UNIT per tablet Take 1 tablet by mouth 2 (two) times daily.    . Cranberry 500 MG CAPS Take 500 mg by mouth daily.    . folic acid (FOLVITE) 1 MG tablet Take 1 mg by mouth daily.    [name] lisinopril (PRINIVIL) 10 MG tablet Take 1 tablet (10 mg total) by mouth daily. 30  tablet 2  . Multiple Vitamin (MULTIVITAMIN) tablet Take 1 tablet by mouth daily.    . Nutritional Supplements (ESTROVEN PO) Take 1 tablet by mouth daily.    . ondansetron (ZOFRAN-ODT) 4 MG disintegrating tablet     . predniSONE (DELTASONE) 10 MG tablet Take 2 tablets (20 mg total) by mouth daily with breakfast. (Patient taking differently: Take 10 mg by mouth daily with breakfast. ) 90 tablet 1  . ranitidine (ZANTAC) 150 MG tablet Take 150 mg by mouth daily.    . vitamin B-12 (CYANOCOBALAMIN) 500 MCG tablet Take 500 mcg by mouth daily.      No results found for this or any previous visit (from the past 48 hour(s)). No results found.  ROS: Pain with rom of the right lower extremity  Physical Exam:  Alert and oriented 75 y.o. female in no acute distress Cranial nerves 2-12 intact Cervical spine: full rom with no tenderness, nv intact distally Chest: active breath sounds bilaterally, no wheeze rhonchi or rales Heart: regular rate and rhythm, no murmur Abd: non tender non distended with active bowel sounds Hip is stable with rom  Right knee with moderate joint line tenderness nv intact distally Antalgic gait No rashes   Assessment/Plan Assessment: right knee end stage ostoearthritis  Plan: Patient will undergo a right total knee arthroplasty by Dr. [name] at [facility]. Risks benefits and expectations were discussed with the patient. Patient understand risks, benefits and expectations and wishes to proceed.

## 2015-05-08 ENCOUNTER — Encounter (HOSPITAL_COMMUNITY)
Admission: RE | Admit: 2015-05-08 | Discharge: 2015-05-08 | Disposition: A | Discharge status: Home / Self Care | Payer: Medicare Other | Source: Ambulatory Visit | Attending: Orthopedic Surgery | Admitting: Orthopedic Surgery

## 2015-05-08 ENCOUNTER — Encounter (HOSPITAL_COMMUNITY): Payer: Self-pay

## 2015-05-08 DIAGNOSIS — I129 Hypertensive chronic kidney disease with stage 1 through stage 4 chronic kidney disease, or unspecified chronic kidney disease: Secondary | ICD-10-CM | POA: Diagnosis not present

## 2015-05-08 DIAGNOSIS — J449 Chronic obstructive pulmonary disease, unspecified: Secondary | ICD-10-CM | POA: Diagnosis not present

## 2015-05-08 DIAGNOSIS — M1711 Unilateral primary osteoarthritis, right knee: Secondary | ICD-10-CM | POA: Diagnosis not present

## 2015-05-08 DIAGNOSIS — E7800 Pure hypercholesterolemia, unspecified: Secondary | ICD-10-CM | POA: Insufficient documentation

## 2015-05-08 DIAGNOSIS — K219 Gastro-esophageal reflux disease without esophagitis: Secondary | ICD-10-CM | POA: Insufficient documentation

## 2015-05-08 DIAGNOSIS — Z01812 Encounter for preprocedural laboratory examination: Secondary | ICD-10-CM | POA: Insufficient documentation

## 2015-05-08 DIAGNOSIS — M353 Polymyalgia rheumatica: Secondary | ICD-10-CM | POA: Insufficient documentation

## 2015-05-08 DIAGNOSIS — Z7982 Long term (current) use of aspirin: Secondary | ICD-10-CM | POA: Insufficient documentation

## 2015-05-08 DIAGNOSIS — F329 Major depressive disorder, single episode, unspecified: Secondary | ICD-10-CM | POA: Insufficient documentation

## 2015-05-08 DIAGNOSIS — Z87891 Personal history of nicotine dependence: Secondary | ICD-10-CM | POA: Insufficient documentation

## 2015-05-08 DIAGNOSIS — Z7952 Long term (current) use of systemic steroids: Secondary | ICD-10-CM | POA: Diagnosis not present

## 2015-05-08 DIAGNOSIS — F419 Anxiety disorder, unspecified: Secondary | ICD-10-CM | POA: Diagnosis not present

## 2015-05-08 DIAGNOSIS — N183 Chronic kidney disease, stage 3 (moderate): Secondary | ICD-10-CM | POA: Diagnosis not present

## 2015-05-08 DIAGNOSIS — Z79899 Other long term (current) drug therapy: Secondary | ICD-10-CM | POA: Diagnosis not present

## 2015-05-08 DIAGNOSIS — Z01818 Encounter for other preprocedural examination: Secondary | ICD-10-CM | POA: Diagnosis present

## 2015-05-08 DIAGNOSIS — M810 Age-related osteoporosis without current pathological fracture: Secondary | ICD-10-CM | POA: Insufficient documentation

## 2015-05-08 HISTORY — DX: Disease of blood and blood-forming organs, unspecified: D75.9

## 2015-05-08 LAB — BASIC METABOLIC PANEL
ANION GAP: 9 (ref 5–15)
BUN: 21 mg/dL — ABNORMAL HIGH (ref 6–20)
CHLORIDE: 108 mmol/L (ref 101–111)
CO2: 23 mmol/L (ref 22–32)
Calcium: 10 mg/dL (ref 8.9–10.3)
Creatinine, Ser: 1.19 mg/dL — ABNORMAL HIGH (ref 0.44–1.00)
GFR calc Af Amer: 50 mL/min — ABNORMAL LOW (ref >60)
GFR, EST NON AFRICAN AMERICAN: 44 mL/min — AB (ref >60)
Glucose, Bld: 114 mg/dL — ABNORMAL HIGH (ref 65–99)
POTASSIUM: 4.2 mmol/L (ref 3.5–5.1)
SODIUM: 140 mmol/L (ref 135–145)

## 2015-05-08 LAB — CBC
HEMATOCRIT: 33.2 % — AB (ref 36.0–46.0)
HEMOGLOBIN: 11.1 g/dL — AB (ref 12.0–15.0)
MCH: 32.1 pg (ref 26.0–34.0)
MCHC: 33.4 g/dL (ref 30.0–36.0)
MCV: 96 fL (ref 78.0–100.0)
PLATELETS: 246 10*3/uL (ref 150–400)
RBC: 3.46 MIL/uL — ABNORMAL LOW (ref 3.87–5.11)
RDW: 13.5 % (ref 11.5–15.5)
WBC: 5.2 10*3/uL (ref 4.0–10.5)

## 2015-05-08 LAB — SURGICAL PCR SCREEN
MRSA, PCR: NEGATIVE
STAPHYLOCOCCUS AUREUS: NEGATIVE

## 2015-05-08 NOTE — Pre-Procedure Instructions (Addendum)
Nicole Meyer  05/08/2015      [facility] - [place], [place] - [address] [place] [address] Phone: [phone] Fax: [phone]    Your procedure is scheduled on 05/19/15.  Report to [facility] Admitting at 830 A.M.  Call this number if you have problems the morning of surgery:  [phone]   Remember:  Do not eat food or drink liquids after midnight.  Take these medicines the morning of surgery with A SIP OF WATER xanax ,prednisone, zantac, zofran if needed  STOP all herbel meds, nsaids (aleve,naproxen,advil,ibuprofen) 5 days prior to surgery 05/14/15 including aspirin, ocivite, cal+D,cranberry, folic acid, multi vit, vit B-12   Do not wear jewelry, make-up or nail polish.  Do not wear lotions, powders, or perfumes.  You may wear deodorant.  Do not shave 48 hours prior to surgery.  Men may shave face and neck.  Do not bring valuables to the hospital.  [facility] is not responsible for any belongings or valuables.  Contacts, dentures or bridgework may not be worn into surgery.  Leave your suitcase in the car.  After surgery it may be brought to your room.  For patients admitted to the hospital, discharge time will be determined by your treatment team.  Patients discharged the day of surgery will not be allowed to drive home.   Name and phone number of your driver:   Special instructions:   Special Instructions: [facility] - Preparing for Surgery  Before surgery, you can play an important role.  Because skin is not sterile, your skin needs to be as free of germs as possible.  You can reduce the number of germs on you skin by washing with CHG (chlorahexidine gluconate) soap before surgery.  CHG is an antiseptic cleaner which kills germs and bonds with the skin to continue killing germs even after washing.  Please DO NOT use if you have an allergy to CHG or antibacterial soaps.  If your skin becomes reddened/irritated  stop using the CHG and inform your nurse when you arrive at Short Stay.  Do not shave (including legs and underarms) for at least 48 hours prior to the first CHG shower.  You may shave your face.  Please follow these instructions carefully:   1.  Shower with CHG Soap the night before surgery and the morning of Surgery.  2.  If you choose to wash your hair, wash your hair first as usual with your normal shampoo.  3.  After you shampoo, rinse your hair and body thoroughly to remove the Shampoo.  4.  Use CHG as you would any other liquid soap.  You can apply chg directly  to the skin and wash gently with scrungie or a clean washcloth.  5.  Apply the CHG Soap to your body ONLY FROM THE NECK DOWN.  Do not use on open wounds or open sores.  Avoid contact with your eyes ears, mouth and genitals (private parts).  Wash genitals (private parts)       with your normal soap.  6.  Wash thoroughly, paying special attention to the area where your surgery will be performed.  7.  Thoroughly rinse your body with warm water from the neck down.  8.  DO NOT shower/wash with your normal soap after using and rinsing off the CHG Soap.  9.  Pat yourself dry with a clean towel.  10.  Wear clean pajamas.            11.  Place clean sheets on your bed the night of your first shower and do not sleep with pets.  Day of Surgery  Do not apply any lotions/deodorants the morning of surgery.  Please wear clean clothes to the hospital/surgery center.  Please read over the following fact sheets that you were given. Pain Booklet, Coughing and Deep Breathing, Total Joint Packet, MRSA Information and Surgical Site Infection Prevention

## 2015-05-09 NOTE — Progress Notes (Signed)
Anesthesia Chart Review: Patient is a 75 year old female scheduled for right TKA on 05/19/15 by Dr. [name].  History includes former smoker, HTN, IBS, anxiety, depression, CKD stage III, hypercalcemia, fatigue, COPD, hypercholesterolemia, syncope '13 (felt d/t vasovagal response from pain), osteoporosis, skin cancer (nose), Spherocytosis, iron deficiency anemia, leukocytopenia and lymphopenia, GERD, Hiatal hernia s/p Nissen fundoplication '14, polymyalgia rheumatica, hyperbilirubinemia normal 08/01/14), gout, arthritis, hysterectomy, appendectomy. BMI 30.   PCP is Dr. [name] ([facility]). On 04/10/15, he wrote a letter indicating that he felt her HTN, CKD, polymyalgia rheumatica, asthma, and anemia were controlled. "Overall I think she is of slightly above average risk for complication s from surgery and anesthesia but feel that she is okay to proceed with knee replacement surgery."  Hematologist is Dr. Alvy Bimler, last visit 11/28/14. She sees patient for history of severe anemia. In 08/2011, bone marrow biopsy came back hypercellular but unremarkable. Iron recommended. Between November to December 2014, she had multiple hospitalization requiring blood transfusion. Iron supplement discontinued. Osmotic fragility testing came back positive, suspicious for hemoglobinopathy such as spherocytosis. Since June 2016, she was maintained on prednisone 5 mg daily which also treats her PMR. High dose folic acid and vitamin B12 recommended. Patient has declined splenectomy. 6 month follow-up planned (05/24/15).  Meds include albuterol, Xanax, ASA 81 mg, folvite, lisinopril, Macrodantin (complete 05/10/15), Zofran, prednisone 5 mg daily, Zantac.  PAT Vitals: BP 128/63, HR 63, RR 20, T 36.7C, O2 sat 97%.  04/10/15 EKG (PCP): SR. Low r wave in III, aVF.  12/26/11 Echo: Study Conclusions - Left ventricle: The cavity size was normal. Systolic function was normal. The estimated ejection fraction was in the  range of 55% to 60%. Wall motion was normal; there were no regional wall motion abnormalities. Doppler parameters are consistent with abnormal left ventricular relaxation (grade 1 diastolic dysfunction). Doppler parameters are consistent with high ventricular filling pressure. - Aorta: There is moderate atherosclerosis of the aortic root seen best in the parasternal long axis along the inferior surface of the aorta adjacent to the non coronary cusp as well as the anterior surface at the sinotubular junction. - Pulmonary arteries: Systolic pressure was mildly increased. PA peak pressure: 41m Hg (S).  Preoperative labs noted. Cr 1.19, BUN 21, glucose 114. H/H 11.2/33.2.  Patient has medical clearance. If no acute changes then I anticipate that she can proceed as planned. Reviewed hematologic history with anesthesiologist Dr. ETherisa Doyne No additional pre-operative labs requested. Further evaluation on the day of surgery by her anesthesia team.  AMyra Gianotti PA-C [facility]/Anesthesiology Phone [phone]/21/2017 5:50 PM

## 2015-05-16 NOTE — Progress Notes (Signed)
Pt called about her bp meds, she is asking when to stop her Lisinopril. Encouraged her to take everyday except on the day of surgery, voices understanding.

## 2015-05-19 ENCOUNTER — Encounter (HOSPITAL_COMMUNITY)
Admission: RE | Disposition: A | Discharge status: Skilled Nursing Facility | Payer: Self-pay | Source: Ambulatory Visit | Attending: Orthopedic Surgery

## 2015-05-19 ENCOUNTER — Encounter (HOSPITAL_COMMUNITY): Payer: Self-pay | Admitting: General Practice

## 2015-05-19 ENCOUNTER — Inpatient Hospital Stay (HOSPITAL_COMMUNITY): Payer: Medicare Other | Admitting: Anesthesiology

## 2015-05-19 ENCOUNTER — Telehealth: Payer: Self-pay | Admitting: Hematology and Oncology

## 2015-05-19 ENCOUNTER — Inpatient Hospital Stay (HOSPITAL_COMMUNITY): Payer: Medicare Other | Admitting: Vascular Surgery

## 2015-05-19 ENCOUNTER — Inpatient Hospital Stay (HOSPITAL_COMMUNITY)
Admission: RE | Admit: 2015-05-19 | Discharge: 2015-05-22 | DRG: 470 | Disposition: A | Discharge status: Skilled Nursing Facility | Payer: Medicare Other | Source: Ambulatory Visit | Attending: Orthopedic Surgery | Admitting: Orthopedic Surgery

## 2015-05-19 DIAGNOSIS — M810 Age-related osteoporosis without current pathological fracture: Secondary | ICD-10-CM | POA: Diagnosis present

## 2015-05-19 DIAGNOSIS — Z7952 Long term (current) use of systemic steroids: Secondary | ICD-10-CM

## 2015-05-19 DIAGNOSIS — M109 Gout, unspecified: Secondary | ICD-10-CM | POA: Diagnosis present

## 2015-05-19 DIAGNOSIS — Z7982 Long term (current) use of aspirin: Secondary | ICD-10-CM | POA: Diagnosis not present

## 2015-05-19 DIAGNOSIS — Z880 Allergy status to penicillin: Secondary | ICD-10-CM | POA: Diagnosis not present

## 2015-05-19 DIAGNOSIS — M1711 Unilateral primary osteoarthritis, right knee: Secondary | ICD-10-CM | POA: Diagnosis present

## 2015-05-19 DIAGNOSIS — J449 Chronic obstructive pulmonary disease, unspecified: Secondary | ICD-10-CM | POA: Diagnosis present

## 2015-05-19 DIAGNOSIS — Z87891 Personal history of nicotine dependence: Secondary | ICD-10-CM

## 2015-05-19 DIAGNOSIS — K589 Irritable bowel syndrome without diarrhea: Secondary | ICD-10-CM | POA: Diagnosis present

## 2015-05-19 DIAGNOSIS — Z888 Allergy status to other drugs, medicaments and biological substances status: Secondary | ICD-10-CM

## 2015-05-19 DIAGNOSIS — D62 Acute posthemorrhagic anemia: Secondary | ICD-10-CM | POA: Diagnosis not present

## 2015-05-19 DIAGNOSIS — F419 Anxiety disorder, unspecified: Secondary | ICD-10-CM | POA: Diagnosis present

## 2015-05-19 DIAGNOSIS — J45909 Unspecified asthma, uncomplicated: Secondary | ICD-10-CM | POA: Diagnosis present

## 2015-05-19 DIAGNOSIS — Z79899 Other long term (current) drug therapy: Secondary | ICD-10-CM

## 2015-05-19 DIAGNOSIS — M353 Polymyalgia rheumatica: Secondary | ICD-10-CM | POA: Diagnosis present

## 2015-05-19 DIAGNOSIS — I129 Hypertensive chronic kidney disease with stage 1 through stage 4 chronic kidney disease, or unspecified chronic kidney disease: Secondary | ICD-10-CM | POA: Diagnosis present

## 2015-05-19 DIAGNOSIS — N183 Chronic kidney disease, stage 3 (moderate): Secondary | ICD-10-CM | POA: Diagnosis present

## 2015-05-19 DIAGNOSIS — Z96651 Presence of right artificial knee joint: Secondary | ICD-10-CM

## 2015-05-19 DIAGNOSIS — D509 Iron deficiency anemia, unspecified: Secondary | ICD-10-CM | POA: Diagnosis present

## 2015-05-19 DIAGNOSIS — K219 Gastro-esophageal reflux disease without esophagitis: Secondary | ICD-10-CM | POA: Diagnosis present

## 2015-05-19 DIAGNOSIS — Z96659 Presence of unspecified artificial knee joint: Secondary | ICD-10-CM

## 2015-05-19 HISTORY — PX: TOTAL KNEE ARTHROPLASTY: SHX125

## 2015-05-19 LAB — PROTIME-INR
INR: 1.12 (ref 0.00–1.49)
Prothrombin Time: 14.6 seconds (ref 11.6–15.2)

## 2015-05-19 LAB — CBC
HCT: 28 % — ABNORMAL LOW (ref 36.0–46.0)
HEMOGLOBIN: 8.9 g/dL — AB (ref 12.0–15.0)
MCH: 30.5 pg (ref 26.0–34.0)
MCHC: 31.8 g/dL (ref 30.0–36.0)
MCV: 95.9 fL (ref 78.0–100.0)
Platelets: 140 10*3/uL — ABNORMAL LOW (ref 150–400)
RBC: 2.92 MIL/uL — ABNORMAL LOW (ref 3.87–5.11)
RDW: 13.5 % (ref 11.5–15.5)
WBC: 5.8 10*3/uL (ref 4.0–10.5)

## 2015-05-19 LAB — CREATININE, SERUM
CREATININE: 1.09 mg/dL — AB (ref 0.44–1.00)
GFR calc Af Amer: 56 mL/min — ABNORMAL LOW (ref >60)
GFR, EST NON AFRICAN AMERICAN: 48 mL/min — AB (ref >60)

## 2015-05-19 SURGERY — ARTHROPLASTY, KNEE, TOTAL
Anesthesia: General | Site: Knee | Laterality: Right

## 2015-05-19 MED ORDER — FOLIC ACID 1 MG PO TABS
1.0000 mg | ORAL_TABLET | Freq: Every day | ORAL | Status: DC
  Administered 2015-05-20 – 2015-05-22 (×3): 1 mg via ORAL
  Filled 2015-05-19 (×3): qty 1

## 2015-05-19 MED ORDER — PHENOL 1.4 % MT LIQD: 1.0000 | OROMUCOSAL | Status: DC | PRN

## 2015-05-19 MED ORDER — MIDAZOLAM HCL 2 MG/2ML IJ SOLN
INTRAMUSCULAR | Status: AC
  Filled 2015-05-19: qty 2

## 2015-05-19 MED ORDER — POLYETHYLENE GLYCOL 3350 17 G PO PACK: 17.0000 g | PACK | Freq: Every day | ORAL | Status: DC | PRN

## 2015-05-19 MED ORDER — WARFARIN SODIUM 5 MG PO TABS
5.0000 mg | ORAL_TABLET | Freq: Once | ORAL | Status: AC
  Administered 2015-05-19: 5 mg via ORAL
  Filled 2015-05-19: qty 1

## 2015-05-19 MED ORDER — ENOXAPARIN SODIUM 30 MG/0.3ML ~~LOC~~ SOLN
30.0000 mg | Freq: Two times a day (BID) | SUBCUTANEOUS | Status: DC
  Administered 2015-05-20 – 2015-05-22 (×5): 30 mg via SUBCUTANEOUS
  Filled 2015-05-19 (×5): qty 0.3

## 2015-05-19 MED ORDER — LIDOCAINE HCL (CARDIAC) 20 MG/ML IV SOLN
INTRAVENOUS | Status: DC | PRN
  Administered 2015-05-19: 40 mg via INTRAVENOUS

## 2015-05-19 MED ORDER — ONE-DAILY MULTI VITAMINS PO TABS: 1.0000 | ORAL_TABLET | Freq: Every day | ORAL | Status: DC

## 2015-05-19 MED ORDER — HYDROMORPHONE HCL 1 MG/ML IJ SOLN
0.5000 mg | INTRAMUSCULAR | Status: DC | PRN
  Administered 2015-05-19 – 2015-05-20 (×2): 1 mg via INTRAVENOUS
  Filled 2015-05-19 (×2): qty 1

## 2015-05-19 MED ORDER — NITROFURANTOIN MACROCRYSTAL 100 MG PO CAPS: 100.0000 mg | ORAL_CAPSULE | Freq: Two times a day (BID) | ORAL | Status: DC

## 2015-05-19 MED ORDER — PROPOFOL 10 MG/ML IV BOLUS
INTRAVENOUS | Status: AC
  Filled 2015-05-19: qty 20

## 2015-05-19 MED ORDER — ALBUTEROL SULFATE (2.5 MG/3ML) 0.083% IN NEBU: 2.5000 mg | INHALATION_SOLUTION | Freq: Four times a day (QID) | RESPIRATORY_TRACT | Status: DC | PRN

## 2015-05-19 MED ORDER — ALUMINUM & MAGNESIUM HYDROXIDE 200-200 MG/5ML PO SUSP
10.0000 mL | Freq: Four times a day (QID) | ORAL | Status: DC | PRN
  Filled 2015-05-19: qty 10

## 2015-05-19 MED ORDER — CALCIUM CARBONATE-VITAMIN D 500-200 MG-UNIT PO TABS
1.0000 | ORAL_TABLET | Freq: Two times a day (BID) | ORAL | Status: DC
  Administered 2015-05-19 – 2015-05-22 (×6): 1 via ORAL
  Filled 2015-05-19 (×6): qty 1

## 2015-05-19 MED ORDER — ALBUTEROL SULFATE HFA 108 (90 BASE) MCG/ACT IN AERS: 2.0000 | INHALATION_SPRAY | Freq: Four times a day (QID) | RESPIRATORY_TRACT | Status: DC | PRN

## 2015-05-19 MED ORDER — FERROUS SULFATE 325 (65 FE) MG PO TABS
325.0000 mg | ORAL_TABLET | Freq: Three times a day (TID) | ORAL | Status: DC
  Administered 2015-05-19 – 2015-05-22 (×9): 325 mg via ORAL
  Filled 2015-05-19 (×7): qty 1

## 2015-05-19 MED ORDER — ONDANSETRON 4 MG PO TBDP
4.0000 mg | ORAL_TABLET | Freq: Three times a day (TID) | ORAL | Status: DC | PRN
  Filled 2015-05-19: qty 1

## 2015-05-19 MED ORDER — WARFARIN - PHARMACIST DOSING INPATIENT
Freq: Every day | Status: DC
  Administered 2015-05-20: 18:00:00

## 2015-05-19 MED ORDER — DEXMEDETOMIDINE HCL IN NACL 200 MCG/50ML IV SOLN
0.4000 ug/kg/h | INTRAVENOUS | Status: DC
  Administered 2015-05-19: 0.535 ug/kg/h via INTRAVENOUS

## 2015-05-19 MED ORDER — SODIUM CHLORIDE 0.9 % IV SOLN
INTRAVENOUS | Status: DC
  Administered 2015-05-19: 21:00:00 via INTRAVENOUS

## 2015-05-19 MED ORDER — CHLORHEXIDINE GLUCONATE 4 % EX LIQD: 60.0000 mL | Freq: Once | CUTANEOUS | Status: DC

## 2015-05-19 MED ORDER — VITAMIN B-12 1000 MCG PO TABS
500.0000 ug | ORAL_TABLET | Freq: Every day | ORAL | Status: DC
  Administered 2015-05-20 – 2015-05-22 (×3): 500 ug via ORAL
  Filled 2015-05-19 (×4): qty 1

## 2015-05-19 MED ORDER — DEXMEDETOMIDINE BOLUS VIA INFUSION
0.7000 ug/kg | Freq: Once | INTRAVENOUS | Status: DC
  Filled 2015-05-19: qty 53

## 2015-05-19 MED ORDER — BUPIVACAINE-EPINEPHRINE (PF) 0.5% -1:200000 IJ SOLN
INTRAMUSCULAR | Status: DC | PRN
  Administered 2015-05-19: 30 mL

## 2015-05-19 MED ORDER — WARFARIN VIDEO: Freq: Once | Status: DC

## 2015-05-19 MED ORDER — METOCLOPRAMIDE HCL 5 MG/ML IJ SOLN: 5.0000 mg | Freq: Three times a day (TID) | INTRAMUSCULAR | Status: DC | PRN

## 2015-05-19 MED ORDER — FENTANYL CITRATE (PF) 250 MCG/5ML IJ SOLN
INTRAMUSCULAR | Status: AC
  Filled 2015-05-19: qty 5

## 2015-05-19 MED ORDER — DEXTROSE 5 % IV SOLN
500.0000 mg | Freq: Four times a day (QID) | INTRAVENOUS | Status: DC | PRN
  Filled 2015-05-19: qty 5

## 2015-05-19 MED ORDER — METHOCARBAMOL 500 MG PO TABS
500.0000 mg | ORAL_TABLET | Freq: Four times a day (QID) | ORAL | Status: DC | PRN
  Administered 2015-05-19 – 2015-05-21 (×4): 500 mg via ORAL
  Filled 2015-05-19 (×3): qty 1

## 2015-05-19 MED ORDER — GLYCOPYRROLATE 0.2 MG/ML IJ SOLN
INTRAMUSCULAR | Status: DC | PRN
  Administered 2015-05-19: 0.2 mg via INTRAVENOUS

## 2015-05-19 MED ORDER — FAMOTIDINE 20 MG PO TABS
20.0000 mg | ORAL_TABLET | Freq: Every day | ORAL | Status: DC
  Administered 2015-05-20 – 2015-05-22 (×3): 20 mg via ORAL
  Filled 2015-05-19 (×3): qty 1

## 2015-05-19 MED ORDER — ALBUMIN HUMAN 5 % IV SOLN
12.5000 g | Freq: Once | INTRAVENOUS | Status: AC
  Administered 2015-05-19: 12.5 g via INTRAVENOUS

## 2015-05-19 MED ORDER — DEXMEDETOMIDINE BOLUS VIA INFUSION
1.0000 ug/kg | Freq: Once | INTRAVENOUS | Status: DC
  Filled 2015-05-19: qty 75

## 2015-05-19 MED ORDER — ONDANSETRON HCL 4 MG/2ML IJ SOLN
4.0000 mg | Freq: Four times a day (QID) | INTRAMUSCULAR | Status: DC | PRN
  Administered 2015-05-19 – 2015-05-20 (×3): 4 mg via INTRAVENOUS
  Filled 2015-05-19 (×3): qty 2

## 2015-05-19 MED ORDER — LISINOPRIL 10 MG PO TABS
10.0000 mg | ORAL_TABLET | Freq: Every day | ORAL | Status: DC
  Administered 2015-05-20 – 2015-05-22 (×3): 10 mg via ORAL
  Filled 2015-05-19 (×3): qty 1

## 2015-05-19 MED ORDER — ONDANSETRON HCL 4 MG/2ML IJ SOLN: 4.0000 mg | Freq: Once | INTRAMUSCULAR | Status: DC | PRN

## 2015-05-19 MED ORDER — ONDANSETRON HCL 4 MG PO TABS
4.0000 mg | ORAL_TABLET | Freq: Four times a day (QID) | ORAL | Status: DC | PRN
  Administered 2015-05-20: 4 mg via ORAL
  Filled 2015-05-19: qty 1

## 2015-05-19 MED ORDER — PROPOFOL 10 MG/ML IV BOLUS
INTRAVENOUS | Status: DC | PRN
  Administered 2015-05-19: 120 mg via INTRAVENOUS

## 2015-05-19 MED ORDER — ACETAMINOPHEN 650 MG RE SUPP: 650.0000 mg | Freq: Four times a day (QID) | RECTAL | Status: DC | PRN

## 2015-05-19 MED ORDER — MENTHOL 3 MG MT LOZG: 1.0000 | LOZENGE | OROMUCOSAL | Status: DC | PRN

## 2015-05-19 MED ORDER — LIDOCAINE HCL (CARDIAC) 20 MG/ML IV SOLN
INTRAVENOUS | Status: AC
  Filled 2015-05-19: qty 10

## 2015-05-19 MED ORDER — DOCUSATE SODIUM 100 MG PO CAPS
100.0000 mg | ORAL_CAPSULE | Freq: Two times a day (BID) | ORAL | Status: DC
  Administered 2015-05-19 – 2015-05-22 (×6): 100 mg via ORAL
  Filled 2015-05-19 (×6): qty 1

## 2015-05-19 MED ORDER — ACETAMINOPHEN 325 MG PO TABS
650.0000 mg | ORAL_TABLET | Freq: Four times a day (QID) | ORAL | Status: DC | PRN
  Administered 2015-05-20 – 2015-05-22 (×4): 650 mg via ORAL
  Filled 2015-05-19 (×3): qty 2

## 2015-05-19 MED ORDER — ASPIRIN EC 81 MG PO TBEC
81.0000 mg | DELAYED_RELEASE_TABLET | Freq: Every day | ORAL | Status: DC
  Administered 2015-05-20 – 2015-05-22 (×3): 81 mg via ORAL
  Filled 2015-05-19 (×3): qty 1

## 2015-05-19 MED ORDER — OXYCODONE-ACETAMINOPHEN 5-325 MG PO TABS: 1.0000 | ORAL_TABLET | ORAL | Status: DC | PRN

## 2015-05-19 MED ORDER — WARFARIN SODIUM 5 MG PO TABS: 5.0000 mg | ORAL_TABLET | Freq: Every day | ORAL | Status: DC

## 2015-05-19 MED ORDER — FENTANYL CITRATE (PF) 100 MCG/2ML IJ SOLN
INTRAMUSCULAR | Status: DC | PRN
  Administered 2015-05-19: 50 ug via INTRAVENOUS
  Administered 2015-05-19: 25 ug via INTRAVENOUS
  Administered 2015-05-19: 50 ug via INTRAVENOUS
  Administered 2015-05-19: 25 ug via INTRAVENOUS
  Administered 2015-05-19 (×2): 50 ug via INTRAVENOUS

## 2015-05-19 MED ORDER — SODIUM CHLORIDE 0.9 % IR SOLN
Status: DC | PRN
  Administered 2015-05-19: 3000 mL

## 2015-05-19 MED ORDER — PROSIGHT PO TABS
1.0000 | ORAL_TABLET | Freq: Every day | ORAL | Status: DC
  Administered 2015-05-20 – 2015-05-22 (×3): 1 via ORAL
  Filled 2015-05-19 (×5): qty 1

## 2015-05-19 MED ORDER — ONDANSETRON HCL 4 MG/2ML IJ SOLN
INTRAMUSCULAR | Status: DC | PRN
  Administered 2015-05-19: 4 mg via INTRAVENOUS

## 2015-05-19 MED ORDER — ALBUMIN HUMAN 5 % IV SOLN
INTRAVENOUS | Status: AC
  Filled 2015-05-19: qty 250

## 2015-05-19 MED ORDER — MIDAZOLAM HCL 2 MG/2ML IJ SOLN
1.0000 mg | Freq: Once | INTRAMUSCULAR | Status: AC
  Administered 2015-05-19: 1 mg via INTRAVENOUS

## 2015-05-19 MED ORDER — ALUM & MAG HYDROXIDE-SIMETH 200-200-20 MG/5ML PO SUSP: 10.0000 mL | Freq: Four times a day (QID) | ORAL | Status: DC | PRN

## 2015-05-19 MED ORDER — LACTATED RINGERS IV SOLN
INTRAVENOUS | Status: DC
  Administered 2015-05-19 (×2): via INTRAVENOUS

## 2015-05-19 MED ORDER — PREDNISONE 20 MG PO TABS
20.0000 mg | ORAL_TABLET | Freq: Every day | ORAL | Status: DC
  Administered 2015-05-20: 20 mg via ORAL
  Filled 2015-05-19: qty 1

## 2015-05-19 MED ORDER — 0.9 % SODIUM CHLORIDE (POUR BTL) OPTIME
TOPICAL | Status: DC | PRN
  Administered 2015-05-19: 1000 mL

## 2015-05-19 MED ORDER — FENTANYL CITRATE (PF) 100 MCG/2ML IJ SOLN
50.0000 ug | Freq: Once | INTRAMUSCULAR | Status: AC
  Administered 2015-05-19: 50 ug via INTRAVENOUS

## 2015-05-19 MED ORDER — METHOCARBAMOL 500 MG PO TABS: 500.0000 mg | ORAL_TABLET | Freq: Three times a day (TID) | ORAL | Status: DC | PRN

## 2015-05-19 MED ORDER — ALPRAZOLAM 0.5 MG PO TABS
0.5000 mg | ORAL_TABLET | Freq: Every day | ORAL | Status: DC
  Administered 2015-05-20 – 2015-05-22 (×3): 0.5 mg via ORAL
  Filled 2015-05-19 (×3): qty 1

## 2015-05-19 MED ORDER — FENTANYL CITRATE (PF) 100 MCG/2ML IJ SOLN: 25.0000 ug | INTRAMUSCULAR | Status: DC | PRN

## 2015-05-19 MED ORDER — CLINDAMYCIN PHOSPHATE 600 MG/50ML IV SOLN
600.0000 mg | Freq: Four times a day (QID) | INTRAVENOUS | Status: AC
  Administered 2015-05-19 (×2): 600 mg via INTRAVENOUS
  Filled 2015-05-19 (×2): qty 50

## 2015-05-19 MED ORDER — FENTANYL CITRATE (PF) 100 MCG/2ML IJ SOLN
INTRAMUSCULAR | Status: AC
  Filled 2015-05-19: qty 2

## 2015-05-19 MED ORDER — PHENYLEPHRINE HCL 10 MG/ML IJ SOLN
10.0000 mg | INTRAVENOUS | Status: DC | PRN
  Administered 2015-05-19: 20 ug/min via INTRAVENOUS

## 2015-05-19 MED ORDER — ADULT MULTIVITAMIN W/MINERALS CH
1.0000 | ORAL_TABLET | Freq: Every day | ORAL | Status: DC
  Administered 2015-05-20 – 2015-05-22 (×3): 1 via ORAL
  Filled 2015-05-19 (×3): qty 1

## 2015-05-19 MED ORDER — CRANBERRY 500 MG PO CAPS: 500.0000 mg | ORAL_CAPSULE | Freq: Every day | ORAL | Status: DC

## 2015-05-19 MED ORDER — PATIENT'S GUIDE TO USING COUMADIN BOOK
Freq: Once | Status: AC
  Administered 2015-05-19: 17:00:00
  Filled 2015-05-19: qty 1

## 2015-05-19 MED ORDER — OXYCODONE HCL 5 MG PO TABS
5.0000 mg | ORAL_TABLET | ORAL | Status: DC | PRN
  Administered 2015-05-19 – 2015-05-22 (×16): 10 mg via ORAL
  Filled 2015-05-19 (×15): qty 2

## 2015-05-19 MED ORDER — METOCLOPRAMIDE HCL 5 MG PO TABS: 5.0000 mg | ORAL_TABLET | Freq: Three times a day (TID) | ORAL | Status: DC | PRN

## 2015-05-19 MED ORDER — CLINDAMYCIN PHOSPHATE 900 MG/50ML IV SOLN
900.0000 mg | INTRAVENOUS | Status: AC
  Administered 2015-05-19: 900 mg via INTRAVENOUS
  Filled 2015-05-19: qty 50

## 2015-05-19 SURGICAL SUPPLY — 60 items
BANDAGE ESMARK 6X9 LF (GAUZE/BANDAGES/DRESSINGS) ×1 IMPLANT
BLADE SAW SGTL 13.0X1.19X90.0M (BLADE) ×3 IMPLANT
BLADE SAW SGTL 13X75X1.27 (BLADE) ×3 IMPLANT
BNDG ELASTIC 6X10 VLCR STRL LF (GAUZE/BANDAGES/DRESSINGS) ×3 IMPLANT
BNDG ESMARK 6X9 LF (GAUZE/BANDAGES/DRESSINGS) ×3
BNDG GAUZE ELAST 4 BULKY (GAUZE/BANDAGES/DRESSINGS) ×6 IMPLANT
BOWL SMART MIX CTS (DISPOSABLE) ×3 IMPLANT
CAPT KNEE TOTAL 3 ATTUNE ×3 IMPLANT
CEMENT HV SMART SET (Cement) ×6 IMPLANT
CLOSURE WOUND 1/2 X4 (GAUZE/BANDAGES/DRESSINGS) ×2
COVER SURGICAL LIGHT HANDLE (MISCELLANEOUS) ×3 IMPLANT
CUFF TOURNIQUET SINGLE 34IN LL (TOURNIQUET CUFF) ×3 IMPLANT
CUFF TOURNIQUET SINGLE 44IN (TOURNIQUET CUFF) IMPLANT
DRAPE EXTREMITY T 121X128X90 (DRAPE) ×3 IMPLANT
DRAPE IMP U-DRAPE 54X76 (DRAPES) ×3 IMPLANT
DRAPE PROXIMA HALF (DRAPES) ×3 IMPLANT
DRAPE U-SHAPE 47X51 STRL (DRAPES) ×3 IMPLANT
DRSG ADAPTIC 3X8 NADH LF (GAUZE/BANDAGES/DRESSINGS) ×3 IMPLANT
DRSG PAD ABDOMINAL 8X10 ST (GAUZE/BANDAGES/DRESSINGS) ×6 IMPLANT
DURAPREP 26ML APPLICATOR (WOUND CARE) ×3 IMPLANT
ELECT CAUTERY BLADE 6.4 (BLADE) ×3 IMPLANT
ELECT REM PT RETURN 9FT ADLT (ELECTROSURGICAL) ×3
ELECTRODE REM PT RTRN 9FT ADLT (ELECTROSURGICAL) ×1 IMPLANT
GAUZE SPONGE 4X4 12PLY STRL (GAUZE/BANDAGES/DRESSINGS) ×3 IMPLANT
GLOVE BIOGEL PI ORTHO PRO 7.5 (GLOVE) ×2
GLOVE BIOGEL PI ORTHO PRO SZ8 (GLOVE) ×2
GLOVE ORTHO TXT STRL SZ7.5 (GLOVE) ×3 IMPLANT
GLOVE PI ORTHO PRO STRL 7.5 (GLOVE) ×1 IMPLANT
GLOVE PI ORTHO PRO STRL SZ8 (GLOVE) ×1 IMPLANT
GLOVE SURG ORTHO 8.5 STRL (GLOVE) ×3 IMPLANT
GOWN STRL REUS W/ TWL XL LVL3 (GOWN DISPOSABLE) ×3 IMPLANT
GOWN STRL REUS W/TWL XL LVL3 (GOWN DISPOSABLE) ×6
HANDPIECE INTERPULSE COAX TIP (DISPOSABLE) ×2
IMMOBILIZER KNEE 22 UNIV (SOFTGOODS) ×3 IMPLANT
KIT BASIN OR (CUSTOM PROCEDURE TRAY) ×3 IMPLANT
KIT MANIFOLD (MISCELLANEOUS) ×3 IMPLANT
KIT ROOM TURNOVER OR (KITS) ×3 IMPLANT
MANIFOLD NEPTUNE II (INSTRUMENTS) ×3 IMPLANT
NS IRRIG 1000ML POUR BTL (IV SOLUTION) ×3 IMPLANT
PACK TOTAL JOINT (CUSTOM PROCEDURE TRAY) ×3 IMPLANT
PACK UNIVERSAL I (CUSTOM PROCEDURE TRAY) ×3 IMPLANT
PAD ARMBOARD 7.5X6 YLW CONV (MISCELLANEOUS) ×6 IMPLANT
SET HNDPC FAN SPRY TIP SCT (DISPOSABLE) ×1 IMPLANT
STRIP CLOSURE SKIN 1/2X4 (GAUZE/BANDAGES/DRESSINGS) ×4 IMPLANT
SUCTION FRAZIER HANDLE 10FR (MISCELLANEOUS) ×2
SUCTION TUBE FRAZIER 10FR DISP (MISCELLANEOUS) ×1 IMPLANT
SUT MNCRL AB 3-0 PS2 18 (SUTURE) ×3 IMPLANT
SUT VIC AB 0 CT1 27 (SUTURE) ×4
SUT VIC AB 0 CT1 27XBRD ANBCTR (SUTURE) ×2 IMPLANT
SUT VIC AB 1 CT1 27 (SUTURE) ×6
SUT VIC AB 1 CT1 27XBRD ANBCTR (SUTURE) ×3 IMPLANT
SUT VIC AB 2-0 CT1 27 (SUTURE) ×4
SUT VIC AB 2-0 CT1 TAPERPNT 27 (SUTURE) ×2 IMPLANT
TOWEL OR 17X24 6PK STRL BLUE (TOWEL DISPOSABLE) ×3 IMPLANT
TOWEL OR 17X26 10 PK STRL BLUE (TOWEL DISPOSABLE) ×3 IMPLANT
TRAY FOLEY CATH 16FRSI W/METER (SET/KITS/TRAYS/PACK) ×3 IMPLANT
TUBE CONNECTING 12'X1/4 (SUCTIONS) ×1
TUBE CONNECTING 12X1/4 (SUCTIONS) ×2 IMPLANT
WATER STERILE IRR 1000ML POUR (IV SOLUTION) IMPLANT
YANKAUER SUCT BULB TIP NO VENT (SUCTIONS) ×3 IMPLANT

## 2015-05-19 NOTE — Anesthesia Procedure Notes (Addendum)
Anesthesia Regional Block:  Femoral nerve block  Pre-Anesthetic Checklist: ,, timeout performed, Correct Patient, Correct Site, Correct Laterality, Correct Procedure, Correct Position, site marked, Risks and benefits discussed,  Surgical consent,  Pre-op evaluation,  At surgeon's request and post-op pain management  Laterality: Right  Prep: Maximum Sterile Barrier Precautions used and chloraprep       Needles:  Injection technique: Single-shot  Needle Type: Echogenic Stimulator Needle     Needle Length: 10cm 10 cm Needle Gauge: 21 and 21 G    Additional Needles:  Procedures: ultrasound guided (picture in chart) and nerve stimulator Femoral nerve block Narrative:  Injection made incrementally with aspirations every 5 mL.  Performed by: Personally  Anesthesiologist: Lauretta Grill  Additional Notes: Patient tolerated the procedure well without complications   Procedure Name: LMA Insertion Date/Time: 05/19/2015 9:42 AM Performed by: Mariea Clonts Pre-anesthesia Checklist: Emergency Drugs available, Timeout performed, Patient identified, Patient being monitored and Suction available Patient Re-evaluated:Patient Re-evaluated prior to inductionOxygen Delivery Method: Circle system utilized Preoxygenation: Pre-oxygenation with 100% oxygen Intubation Type: IV induction LMA: LMA inserted LMA Size: 4.0 Number of attempts: 1 Placement Confirmation: breath sounds checked- equal and bilateral and positive ETCO2 Tube secured with: Tape Dental Injury: Teeth and Oropharynx as per pre-operative assessment

## 2015-05-19 NOTE — Progress Notes (Signed)
ANTICOAGULATION CONSULT NOTE - Initial Consult  Pharmacy Consult for warfarin Indication: VTE prophylaxis  Allergies  Allergen Reactions  . Penicillins Hives    Last reaction was at 35   . Brovana [Arformoterol] Other (See Comments)    "makes me nervous"  . Paxil [Paroxetine Hcl] Rash    Patient Measurements: Height: 5\' 2"  (157.5 cm) Weight: 165 lb (74.844 kg) IBW/kg (Calculated) : 50.1  Vital Signs: Temp: 97 F (36.1 C) (03/31 1415) Temp Source: Oral (03/31 0822) BP: 113/66 mmHg (03/31 1402) Pulse Rate: 48 (03/31 1407)  Labs: No results for input(s): HGB, HCT, PLT, APTT, LABPROT, INR, HEPARINUNFRC, HEPRLOWMOCWT, CREATININE, CKTOTAL, CKMB, TROPONINI in the last 72 hours.  Estimated Creatinine Clearance: 38.7 mL/min (by C-G formula based on Cr of 1.19).   Medical History: Past Medical History  Diagnosis Date  . Leukocytopenia   . HTN (hypertension)   . IBS (irritable bowel syndrome)   . Anxiety   . Depression   . Chronic kidney disease (CKD), stage III (moderate)   . Hypercalcemia   . Fatigue   . Lymphopenia 08/23/2011  . COPD (chronic obstructive pulmonary disease) ([place])   . Renal insufficiency   . UTI (urinary tract infection) 12/26/2011  . Syncope and collapse 12/24/2011    "loss of consciousness for 3-4 min" (12/26/2011)  . Hypercholesteremia     "above borderline; I don't take RX for it" (12/26/2011)  . H/O hiatal hernia   . GERD (gastroesophageal reflux disease)   . Osteoporosis   . Cancer ([place]) 04-06-12    skin cancer nasal bridge-no problems now  . Iron deficiency anemia     [facility]  . History of blood transfusion     "2 units in 12/2012; suppose to get 1 unit today" (02/12/2013)  . Arthritis     "knees and back" (02/12/2013)  . Polymyalgia ([place])   . Gout 04/05/2013  . Hyperbilirubinemia 05/03/2013  . PMR (polymyalgia rheumatica) ([facility])     pmr  . IBS (irritable bowel syndrome)     ibs  . Arthritis   . Anxiety   .  Depression   . Lumbar spinal stenosis   . Osteoarthritis   . Diverticulitis   . Asthma     no problems recently  . Blood dyscrasia     sperocytosis    Medications:  Prescriptions prior to admission  Medication Sig Dispense Refill Last Dose  . albuterol (PROVENTIL HFA;VENTOLIN HFA) 108 (90 Base) MCG/ACT inhaler Inhale 2 puffs into the lungs every 6 (six) hours as needed for wheezing or shortness of breath.   Past Month at Unknown time  . ALPRAZolam (XANAX) 0.5 MG tablet Take 0.5 mg by mouth every morning.    Taking  . aluminum-magnesium hydroxide 200-200 MG/5ML suspension Take 10 mLs by mouth every 6 (six) hours as needed for indigestion.   Past Week at Unknown time  . aspirin EC 81 MG tablet Take 81 mg by mouth daily.   Past Week at Unknown time  . beta carotene w/minerals (OCUVITE) tablet Take 1 tablet by mouth daily.   Past Week at Unknown time  . calcium-vitamin D (OSCAL WITH D) 500-200 MG-UNIT per tablet Take 1 tablet by mouth 2 (two) times daily.   Past Week at Unknown time  . Cranberry 500 MG CAPS Take 500 mg by mouth daily.   Past Week at Unknown time  . folic acid (FOLVITE) 1 MG tablet Take 1 mg by mouth daily.   Past  Week at Unknown time  . lisinopril (PRINIVIL) 10 MG tablet Take 1 tablet (10 mg total) by mouth daily. 30 tablet 2 05/18/2015 at Unknown time  . Multiple Vitamin (MULTIVITAMIN) tablet Take 1 tablet by mouth daily.   Past Week at Unknown time  . nitrofurantoin (MACRODANTIN) 100 MG capsule Take 100 mg by mouth 2 (two) times daily.     . Nutritional Supplements (ESTROVEN PO) Take 1 tablet by mouth daily.   Past Week at Unknown time  . ondansetron (ZOFRAN-ODT) 4 MG disintegrating tablet Take 4 mg by mouth every 8 (eight) hours as needed.    Past Month at Unknown time  . predniSONE (DELTASONE) 10 MG tablet Take 2 tablets (20 mg total) by mouth daily with breakfast. (Patient taking differently: Take 5 mg by mouth daily with breakfast. ) 90 tablet 1 05/19/2015 at 0700  .  vitamin B-12 (CYANOCOBALAMIN) 500 MCG tablet Take 500 mcg by mouth daily.   Past Week at Unknown time  . ranitidine (ZANTAC) 150 MG tablet Take 150 mg by mouth daily.   Taking   Scheduled:  . albumin human      . [START ON 05/20/2015] ALPRAZolam  0.5 mg Oral Daily  . [START ON 05/20/2015] aspirin EC  81 mg Oral Daily  . [START ON 05/20/2015] beta carotene w/minerals  1 tablet Oral Daily  . calcium-vitamin D  1 tablet Oral BID  . clindamycin (CLEOCIN) IV  600 mg Intravenous Q6H  . docusate sodium  100 mg Oral BID  . [START ON 05/20/2015] enoxaparin (LOVENOX) injection  30 mg Subcutaneous Q12H  . [START ON 05/20/2015] famotidine  20 mg Oral Daily  . fentaNYL      . ferrous sulfate  325 mg Oral TID PC  . [START ON Q000111Q folic acid  1 mg Oral Daily  . lisinopril  10 mg Oral Daily  . midazolam      . [START ON 05/20/2015] multivitamin with minerals  1 tablet Oral Daily  . [START ON 05/20/2015] predniSONE  20 mg Oral Q breakfast  . [START ON 05/20/2015] vitamin B-12  500 mcg Oral Daily    Assessment: 74 yoF now s/p RIGHT TOTAL KNEE ARTHROPLASTY. Pharmacy consulted to start warfarin for VTE prophylaxis.  Goal of Therapy:  INR 2-3 Monitor platelets by anticoagulation protocol: Yes   Plan:  Give warfarin 5 mg po x 1 Monitor daily INR, CBC, clinical course, s/sx of bleed, PO intake, DDI   Thank you for allowing [place] to participate in this patients care. Jens Som, PharmD Pager: [phone]  05/19/2015,2:50 PM

## 2015-05-19 NOTE — Progress Notes (Signed)
Lunch relief by M. Brande RN 

## 2015-05-19 NOTE — Progress Notes (Signed)
Orthopedic Tech Progress Note Patient Details:  Nicole Meyer 1940/06/30 [id number]  CPM Right Knee CPM Right Knee: On Right Knee Flexion (Degrees): 90 Right Knee Extension (Degrees): 0 Additional Comments: Trapeze bar   Nicole Meyer 05/19/2015, 12:30 PM

## 2015-05-19 NOTE — Anesthesia Postprocedure Evaluation (Signed)
Anesthesia Post Note  Patient: Real Cons Salvaggio  Procedure(s) Performed: Procedure(s) (LRB): RIGHT TOTAL KNEE ARTHROPLASTY (Right)  Patient location during evaluation: PACU Anesthesia Type: General and Regional Level of consciousness: awake Pain management: pain level controlled Vital Signs Assessment: post-procedure vital signs reviewed and stable Respiratory status: spontaneous breathing Cardiovascular status: stable Postop Assessment: no signs of nausea or vomiting Anesthetic complications: no    Last Vitals:  Filed Vitals:   05/19/15 1415 05/19/15 1604  BP:  126/61  Pulse:  64  Temp: 36.1 C 36.2 C  Resp:  16    Last Pain:  Filed Vitals:   05/19/15 1605  PainSc: 9                  Larnell Granlund

## 2015-05-19 NOTE — Progress Notes (Signed)
Utilization review completed.  

## 2015-05-19 NOTE — Transfer of Care (Signed)
Immediate Anesthesia Transfer of Care Note  Patient: Nicole Meyer  Procedure(s) Performed: Procedure(s): RIGHT TOTAL KNEE ARTHROPLASTY (Right)  Patient Location: PACU  Anesthesia Type:GA combined with regional for post-op pain  Level of Consciousness: awake, alert  and oriented  Airway & Oxygen Therapy: Patient Spontanous Breathing and Patient connected to nasal cannula oxygen  Post-op Assessment: Report given to RN and Post -op Vital signs reviewed and stable  Post vital signs: Reviewed and stable  Last Vitals:  Filed Vitals:   05/19/15 0915 05/19/15 1148  BP: 149/50   Pulse: 73   Temp:  36.4 C  Resp: 23     Complications: No apparent anesthesia complications

## 2015-05-19 NOTE — Discharge Instructions (Signed)
Ice to the knee as much as you can.  Elevate the right leg when possible. Prop under the heel or ankle, NEVER prop behind the knee.  Keep the incision clean and dry and covered for one week, then ok to get wet in the shower.  Ok to put full weight on the knee with a walker.  Do exercises every hour while awake and use the CPM machine for 4-6 hours per day in two hour increments.  Follow up in the office with Dr [name] in two weeks [phone]   Information on my medicine - Coumadin   (Warfarin)  This medication education was reviewed with me or my healthcare representative as part of my discharge preparation.    Why was Coumadin prescribed for you? Coumadin was prescribed for you because you have a blood clot or a medical condition that can cause an increased risk of forming blood clots. Blood clots can cause serious health problems by blocking the flow of blood to the heart, lung, or brain. Coumadin can prevent harmful blood clots from forming. As a reminder your indication for Coumadin is:   Blood Clot Prevention After Orthopedic Surgery  What test will check on my response to Coumadin? While on Coumadin (warfarin) you will need to have an INR test regularly to ensure that your dose is keeping you in the desired range. The INR (international normalized ratio) number is calculated from the result of the laboratory test called prothrombin time (PT).  If an INR APPOINTMENT HAS NOT ALREADY BEEN MADE FOR YOU please schedule an appointment to have this lab work done by your health care provider within 7 days. Your INR goal is usually a number between:  2 to 3 or your provider may give you a more narrow range like 2-2.5.  Ask your health care provider during an office visit what your goal INR is.  What  do you need to  know  About  COUMADIN? Take Coumadin (warfarin) exactly as prescribed by your healthcare provider about the same time each day.  DO NOT stop taking without talking to the doctor who  prescribed the medication.  Stopping without other blood clot prevention medication to take the place of Coumadin may increase your risk of developing a new clot or stroke.  Get refills before you run out.  What do you do if you miss a dose? If you miss a dose, take it as soon as you remember on the same day then continue your regularly scheduled regimen the next day.  Do not take two doses of Coumadin at the same time.  Important Safety Information A possible side effect of Coumadin (Warfarin) is an increased risk of bleeding. You should call your healthcare provider right away if you experience any of the following: ? Bleeding from an injury or your nose that does not stop. ? Unusual colored urine (red or dark brown) or unusual colored stools (red or black). ? Unusual bruising for unknown reasons. ? A serious fall or if you hit your head (even if there is no bleeding).  Some foods or medicines interact with Coumadin (warfarin) and might alter your response to warfarin. To help avoid this: ? Eat a balanced diet, maintaining a consistent amount of Vitamin K. ? Notify your provider about major diet changes you plan to make. ? Avoid alcohol or limit your intake to 1 drink for women and 2 drinks for men per day. (1 drink is 5 oz. wine, 12 oz. beer, or 1.5  oz. liquor.)  Make sure that ANY health care provider who prescribes medication for you knows that you are taking Coumadin (warfarin).  Also make sure the healthcare provider who is monitoring your Coumadin knows when you have started a new medication including herbals and non-prescription products.  Coumadin (Warfarin)  Major Drug Interactions  Increased Warfarin Effect Decreased Warfarin Effect  Alcohol (large quantities) Antibiotics (esp. Septra/Bactrim, Flagyl, Cipro) Amiodarone (Cordarone) Aspirin (ASA) Cimetidine (Tagamet) Megestrol (Megace) NSAIDs (ibuprofen, naproxen, etc.) Piroxicam (Feldene) Propafenone (Rythmol  SR) Propranolol (Inderal) Isoniazid (INH) Posaconazole (Noxafil) Barbiturates (Phenobarbital) Carbamazepine (Tegretol) Chlordiazepoxide (Librium) Cholestyramine (Questran) Griseofulvin Oral Contraceptives Rifampin Sucralfate (Carafate) Vitamin K   Coumadin (Warfarin) Major Herbal Interactions  Increased Warfarin Effect Decreased Warfarin Effect  Garlic Ginseng Ginkgo biloba Coenzyme Q10 Green tea St. Johns wort    Coumadin (Warfarin) FOOD Interactions  Eat a consistent number of servings per week of foods HIGH in Vitamin K (1 serving =  cup)  Collards (cooked, or boiled & drained) Kale (cooked, or boiled & drained) Mustard greens (cooked, or boiled & drained) Parsley *serving size only =  cup Spinach (cooked, or boiled & drained) Swiss chard (cooked, or boiled & drained) Turnip greens (cooked, or boiled & drained)  Eat a consistent number of servings per week of foods MEDIUM-HIGH in Vitamin K (1 serving = 1 cup)  Asparagus (cooked, or boiled & drained) Broccoli (cooked, boiled & drained, or raw & chopped) Brussel sprouts (cooked, or boiled & drained) *serving size only =  cup Lettuce, raw (green leaf, endive, romaine) Spinach, raw Turnip greens, raw & chopped   These websites have more information on Coumadin (warfarin):  FailFactory.se; VeganReport.com.au;

## 2015-05-19 NOTE — Interval H&P Note (Signed)
History and Physical Interval Note:  05/19/2015 9:32 AM  Real Cons Que  has presented today for surgery, with the diagnosis of right knee end stage osteoarthritis  The various methods of treatment have been discussed with the patient and family. After consideration of risks, benefits and other options for treatment, the patient has consented to  Procedure(s): RIGHT TOTAL KNEE ARTHROPLASTY (Right) as a surgical intervention .  The patient's history has been reviewed, patient examined, no change in status, stable for surgery.  I have reviewed the patient's chart and labs.  Questions were answered to the patient's satisfaction.     Nicole Meyer,STEVEN R

## 2015-05-19 NOTE — Progress Notes (Signed)
SBP in the 80's, SB low-mid 40's, she is arousable, more comfortable, not agitated. Precedex was shut off at 1244. Dr Linna Caprice fully updated. New order for IV Albumin. Will cont to monitor closely.

## 2015-05-19 NOTE — Telephone Encounter (Signed)
pt husband called to cx appt....pt just had knee surgery....pt will call back to sched when feeling better

## 2015-05-19 NOTE — Brief Op Note (Signed)
05/19/2015  11:41 AM  PATIENT:  Nicole Meyer  75 y.o. female  PRE-OPERATIVE DIAGNOSIS:  right knee end stage osteoarthritis  POST-OPERATIVE DIAGNOSIS:  right knee end stage osteoarthritis  PROCEDURE:  Procedure(s): RIGHT TOTAL KNEE ARTHROPLASTY (Right) DePuy Attune  SURGEON:  Surgeon(s) and Role:    * Netta Cedars, MD - Primary  PHYSICIAN ASSISTANT:   ASSISTANTS: Ventura Bruns, PA-C   ANESTHESIA:   regional and general  EBL:  Total I/O In: 1000 [I.V.:1000] Out: 200 [Urine:50; Blood:150]  BLOOD ADMINISTERED:none  DRAINS: none   LOCAL MEDICATIONS USED:  NONE  SPECIMEN:  No Specimen  DISPOSITION OF SPECIMEN:  N/A  COUNTS:  YES  TOURNIQUET:   Total Tourniquet Time Documented: Thigh (Right) - 86 minutes Total: Thigh (Right) - 86 minutes   DICTATION: .Other Dictation: Dictation Number [id number]  PLAN OF CARE: Admit to inpatient   PATIENT DISPOSITION:  PACU - hemodynamically stable.   Delay start of Pharmacological VTE agent (>24hrs) due to surgical blood loss or risk of bleeding: no

## 2015-05-19 NOTE — Anesthesia Preprocedure Evaluation (Signed)
Anesthesia Evaluation  Patient identified by MRN, date of birth, ID band Patient awake    Reviewed: Allergy & Precautions, NPO status , Patient's Chart, lab work & pertinent test results  History of Anesthesia Complications Negative for: history of anesthetic complications  Airway Mallampati: II  TM Distance: >3 FB Neck ROM: Full    Dental no notable dental hx. (+) Dental Advisory Given, Edentulous Upper, Edentulous Lower   Pulmonary asthma , COPD, former smoker,    Pulmonary exam normal breath sounds clear to auscultation       Cardiovascular hypertension, Normal cardiovascular exam Rhythm:Regular Rate:Normal     Neuro/Psych PSYCHIATRIC DISORDERS Anxiety Depression negative neurological ROS     GI/Hepatic negative GI ROS, Neg liver ROS, GERD  Medicated and Controlled,  Endo/Other  obesity  Renal/GU Renal disease  negative genitourinary   Musculoskeletal  (+) Arthritis , Polymyalgia rheumatica on prednisone daily   Abdominal   Peds negative pediatric ROS (+)  Hematology  (+) Blood dyscrasia, anemia ,   Anesthesia Other Findings   Reproductive/Obstetrics negative OB ROS                             Anesthesia Physical Anesthesia Plan  ASA: II  Anesthesia Plan: General   Post-op Pain Management: GA combined w/ Regional for post-op pain   Induction: Intravenous  Airway Management Planned: LMA  Additional Equipment:   Intra-op Plan:   Post-operative Plan: Extubation in OR  Informed Consent: I have reviewed the patients History and Physical, chart, labs and discussed the procedure including the risks, benefits and alternatives for the proposed anesthesia with the patient or authorized representative who has indicated his/her understanding and acceptance.   Dental advisory given  Plan Discussed with: CRNA  Anesthesia Plan Comments:         Anesthesia Quick Evaluation

## 2015-05-19 NOTE — Evaluation (Signed)
Physical Therapy Evaluation Patient Details Name: Nicole Meyer MRN: [id number] DOB: 1941-01-27 Today's Date: 05/19/2015   History of Present Illness  Patient is a 75 y/o female with hx of gout, anxiety, depression, osteoporosis and Hypercholesteremia s/p right TKA.  Clinical Impression  Patient presents with pain, nausea and post surgical deficits RLE s/p Rt TKA impacting mobility. Pt very anxious. Nausea and pain limiting mobility. Tolerated SPT to chair with Mod A and encouragement. Pt plans to discharge home with support from spouse. Will follow acutely to maximize independence and mobility prior to return home.    Follow Up Recommendations Home health PT;Supervision/Assistance - 24 hour    Equipment Recommendations  Rolling walker with 5" wheels    Recommendations for Other Services OT consult     Precautions / Restrictions Precautions Precautions: Knee;Fall Precaution Comments: Reviewed no pillow under knee and precautions. Restrictions Weight Bearing Restrictions: Yes RLE Weight Bearing: Weight bearing as tolerated      Mobility  Bed Mobility Overal bed mobility: Needs Assistance Bed Mobility: Supine to Sit     Supine to sit: Mod assist;HOB elevated     General bed mobility comments: Assist with RLE and to elevate trunk. USe of rail.  Transfers Overall transfer level: Needs assistance Equipment used: Rolling walker (2 wheeled) Transfers: Sit to/from [facility] Sit to Stand: Mod assist Stand pivot transfers: Mod assist       General transfer comment: Assist to boost from EOB wtih cues for hand placement/technique. SPT bed to chair Mod A for balance, pt trying to sit prematurely. Crying in pain. + nausea.  Ambulation/Gait                Stairs            Wheelchair Mobility    Modified Rankin (Stroke Patients Only)       Balance Overall balance assessment: Needs assistance Sitting-balance support: Feet supported;No  upper extremity supported Sitting balance-Leahy Scale: Fair     Standing balance support: During functional activity;Bilateral upper extremity supported Standing balance-Leahy Scale: Poor Standing balance comment: Reliant on BUEs for support.                             Pertinent Vitals/Pain Pain Assessment: Faces Faces Pain Scale: Hurts whole lot Pain Location: right knee with movement Pain Descriptors / Indicators: Sore;Aching;Sharp Pain Intervention(s): Monitored during session;Repositioned;Limited activity within patient's tolerance    Home Living Family/patient expects to be discharged to:: Private residence Living Arrangements: Spouse/significant other Available Help at Discharge: Family;Available 24 hours/day Type of Home: House Home Access: Stairs to enter Entrance Stairs-Rails: Right;Left;Can reach both Entrance Stairs-Number of Steps: 3 Home Layout: One level Home Equipment: Bedside commode;Cane - single point      Prior Function Level of Independence: Independent               Hand Dominance   Dominant Hand: Right    Extremity/Trunk Assessment   Upper Extremity Assessment: Defer to OT evaluation           Lower Extremity Assessment: RLE deficits/detail RLE Deficits / Details: Limited AROM/strength secondary to affects of block and post surgery       Communication   Communication: No difficulties  Cognition Arousal/Alertness: Awake/alert Behavior During Therapy: WFL for tasks assessed/performed;Anxious Overall Cognitive Status: Within Functional Limits for tasks assessed  General Comments General comments (skin integrity, edema, etc.): Spouse and daughter present in room.    Exercises Total Joint Exercises Ankle Circles/Pumps: Both;10 reps;Supine Quad Sets: Both;10 reps;Supine Gluteal Sets: Both;10 reps;Supine      Assessment/Plan    PT Assessment Patient needs continued PT services  PT  Diagnosis Difficulty walking;Acute pain   PT Problem List Decreased strength;Pain;Decreased range of motion;Impaired sensation;Decreased activity tolerance;Decreased balance;Decreased mobility;Decreased knowledge of precautions  PT Treatment Interventions Balance training;Gait training;Stair training;Functional mobility training;Therapeutic activities;Therapeutic exercise;Patient/family education   PT Goals (Current goals can be found in the Care Plan section) Acute Rehab PT Goals Patient Stated Goal: to go home PT Goal Formulation: With patient Time For Goal Achievement: 06/02/15 Potential to Achieve Goals: Fair    Frequency 7X/week   Barriers to discharge Inaccessible home environment stairs to enter home    Co-evaluation               End of Session Equipment Utilized During Treatment: Gait belt;Oxygen Activity Tolerance: Patient limited by pain Patient left: in chair;with call bell/phone within reach;with family/visitor present Nurse Communication: Mobility status         Time: [id number] PT Time Calculation (min) (ACUTE ONLY): 30 min   Charges:   PT Evaluation $PT Eval Low Complexity: 1 Procedure PT Treatments $Therapeutic Activity: 8-22 mins   PT G Codes:        Jordi Lacko A Anaira Seay 05/19/2015, 4:31 PM Wray Kearns, [place], DPT [phone]

## 2015-05-20 LAB — BASIC METABOLIC PANEL
ANION GAP: 7 (ref 5–15)
BUN: 13 mg/dL (ref 6–20)
CALCIUM: 8.9 mg/dL (ref 8.9–10.3)
CO2: 23 mmol/L (ref 22–32)
Chloride: 106 mmol/L (ref 101–111)
Creatinine, Ser: 1.1 mg/dL — ABNORMAL HIGH (ref 0.44–1.00)
GFR, EST AFRICAN AMERICAN: 55 mL/min — AB (ref >60)
GFR, EST NON AFRICAN AMERICAN: 48 mL/min — AB (ref >60)
GLUCOSE: 148 mg/dL — AB (ref 65–99)
Potassium: 4.3 mmol/L (ref 3.5–5.1)
Sodium: 136 mmol/L (ref 135–145)

## 2015-05-20 LAB — PROTIME-INR
INR: 1.2 (ref 0.00–1.49)
PROTHROMBIN TIME: 15.4 s — AB (ref 11.6–15.2)

## 2015-05-20 LAB — CBC
HCT: 26.2 % — ABNORMAL LOW (ref 36.0–46.0)
HEMOGLOBIN: 8.3 g/dL — AB (ref 12.0–15.0)
MCH: 30.6 pg (ref 26.0–34.0)
MCHC: 31.7 g/dL (ref 30.0–36.0)
MCV: 96.7 fL (ref 78.0–100.0)
PLATELETS: 182 10*3/uL (ref 150–400)
RBC: 2.71 MIL/uL — ABNORMAL LOW (ref 3.87–5.11)
RDW: 13.7 % (ref 11.5–15.5)
WBC: 7.4 10*3/uL (ref 4.0–10.5)

## 2015-05-20 MED ORDER — PREDNISONE 5 MG PO TABS
5.0000 mg | ORAL_TABLET | Freq: Every day | ORAL | Status: DC
  Administered 2015-05-21 – 2015-05-22 (×2): 5 mg via ORAL
  Filled 2015-05-20 (×2): qty 1

## 2015-05-20 MED ORDER — WARFARIN SODIUM 5 MG PO TABS
5.0000 mg | ORAL_TABLET | Freq: Once | ORAL | Status: AC
  Administered 2015-05-20: 5 mg via ORAL
  Filled 2015-05-20: qty 1

## 2015-05-20 NOTE — Progress Notes (Signed)
Physical Therapy Treatment Patient Details Name: Nicole Meyer MRN: [id number] DOB: January 30, 1941 Today's Date: 05/20/2015    History of Present Illness Patient is a 75 y/o female with hx of gout, anxiety, depression, osteoporosis and Hypercholesteremia s/p right TKA.    PT Comments    Based on her performance this afternoon, I do not feel patient will progress quickly enough to be functionally safe if d/c to home. We will continue to work with patient as tolerated. She is anxious, lethargic, and has difficulty with basic tasks, requiring up to moderate assistance to mobilize. Recommendations updated. Patient will continue to benefit from skilled physical therapy services to further improve independence with functional mobility.   Follow Up Recommendations  SNF     Equipment Recommendations  Rolling walker with 5" wheels    Recommendations for Other Services       Precautions / Restrictions Precautions Precautions: Knee;Fall Precaution Comments: Reviewed no pillow under knee and precautions. Required Braces or Orthoses: Knee Immobilizer - Right Restrictions Weight Bearing Restrictions: Yes RLE Weight Bearing: Weight bearing as tolerated    Mobility  Bed Mobility Overal bed mobility: Needs Assistance Bed Mobility: Supine to Sit     Supine to sit: HOB elevated;Min guard     General bed mobility comments: Min guard for safety. VC for technique.  Transfers Overall transfer level: Needs assistance Equipment used: Rolling walker (2 wheeled) Transfers: Sit to/from Stand Sit to Stand: Mod assist         General transfer comment: Mod assist for boost to stand. Requires extra time and max VC. VC for hand placement and anterior weight shift. Performed x2 from bed. attempts to sit prematurely  Ambulation/Gait Ambulation/Gait assistance: Mod assist Ambulation Distance (Feet): 6 Feet Assistive device: Rolling walker (2 wheeled) Gait Pattern/deviations: Step-to  pattern;Decreased step length - right;Decreased step length - left;Decreased stride length;Decreased dorsiflexion - right;Decreased dorsiflexion - left;Decreased weight shift to right;Shuffle;Antalgic;Trunk flexed;Wide base of support Gait velocity: slow Gait velocity interpretation: Below normal speed for age/gender General Gait Details: Max VC for upright posture and to keep hands on RW at all times, frequently reaching for bed to sit down despite being several feet away from bed. Not listening to safety instructions. Poor advancement of either LE, shuffling, requiring mod assist for balance and walker control.   Stairs            Wheelchair Mobility    Modified Rankin (Stroke Patients Only)       Balance                                    Cognition Arousal/Alertness: Lethargic;Suspect due to medications Behavior During Therapy: [facility] for tasks assessed/performed;Anxious Overall Cognitive Status: Within Functional Limits for tasks assessed Area of Impairment: Safety/judgement     Memory: Decreased recall of precautions   Safety/Judgement: Decreased awareness of safety          Exercises Total Joint Exercises Ankle Circles/Pumps: Both;10 reps;Supine    General Comments        Pertinent Vitals/Pain Pain Assessment: Faces Faces Pain Scale: Hurts worst Pain Location: Rt knee Pain Descriptors / Indicators: Crying;Grimacing;Guarding Pain Intervention(s): Monitored during session;Repositioned;Limited activity within patient's tolerance;Premedicated before session    Home Living                      Prior Function  PT Goals (current goals can now be found in the care plan section) Acute Rehab PT Goals Patient Stated Goal: to go home PT Goal Formulation: With patient Time For Goal Achievement: 06/02/15 Potential to Achieve Goals: Fair Progress towards PT goals: Progressing toward goals    Frequency  7X/week    PT Plan  Discharge plan needs to be updated    Co-evaluation             End of Session Equipment Utilized During Treatment: Gait belt Activity Tolerance: Patient limited by lethargy;Patient limited by pain Patient left: in chair;with call bell/phone within reach;with SCD's reapplied     Time: [id number] PT Time Calculation (min) (ACUTE ONLY): 18 min  Charges:  $Therapeutic Activity: 8-22 mins                    G Codes:      Ellouise Newer 2015/06/10, 10:07 AM  Elayne Snare, [place]

## 2015-05-20 NOTE — Progress Notes (Signed)
Subjective: 1 Day Post-Op Procedure(s) (LRB): RIGHT TOTAL KNEE ARTHROPLASTY (Right)  Patient reports pain as mild to moderate.  C/o N and lack of appetite since surgery.  Has consumed minimal via PO since surgery.  C/o difficulty sleeping due to pain.  Denies BM, however admits to flatulence.  Denies fever, chills, N/V.  Objective:   VITALS:  Temp:  [97 F (36.1 C)-100.1 F (37.8 C)] 100.1 F (37.8 C) (04/01 0536) Pulse Rate:  [42-81] 73 (04/01 0536) Resp:  [13-18] 18 (04/01 0536) BP: (80-153)/(45-71) 124/58 mmHg (04/01 0536) SpO2:  [91 %-100 %] 100 % (04/01 0536)  General: WDWN patient in NAD. Psych:  Appropriate mood and affect. Neuro:  A&O x 3, Moving all extremities, sensation intact to light touch HEENT:  EOMs intact Chest:  Even non-labored respirations Skin:  Dressing C/D/I, no rashes or lesions Extremities: warm/dry, mild edema, no erythema or echymosis.  No lymphadenopathy. Pulses: Femoral 2+ MSK:  ROM: full ankle, MMT: patient can perform quad set, (-) Homan's    LABS  Recent Labs  05/19/15 1548 05/20/15 0507  HGB 8.9* 8.3*  WBC 5.8 7.4  PLT 140* 182    Recent Labs  05/19/15 1548 05/20/15 0507  NA  --  136  K  --  4.3  CL  --  106  CO2  --  23  BUN  --  13  CREATININE 1.09* 1.10*  GLUCOSE  --  148*    Recent Labs  05/19/15 1548 05/20/15 0507  INR 1.12 1.20     Assessment/Plan: 1 Day Post-Op Procedure(s) (LRB): RIGHT TOTAL KNEE ARTHROPLASTY (Right)  Up with therapy  TDWB as tolerated with therapy Lovenox for DVT prophylaxis Plan for dressing change tomorrow. Plan for D/C to SNF when bed available, likely Monday.  Mechele Claude, PA-C, [employer] Office:  [phone]

## 2015-05-20 NOTE — Progress Notes (Signed)
ANTICOAGULATION CONSULT NOTE - Follow Up Consult  Pharmacy Consult for Warfarin Indication: VTE prophylaxis s/p R-TKA on 3/31  Allergies  Allergen Reactions  . Penicillins Hives    Last reaction was at 35   . Brovana [Arformoterol] Other (See Comments)    "makes me nervous"  . Paxil [Paroxetine Hcl] Rash    Patient Measurements: Height: 5\' 2"  (157.5 cm) Weight: 165 lb (74.844 kg) IBW/kg (Calculated) : 50.1  Vital Signs: Temp: 100.1 F (37.8 C) (04/01 0536) Temp Source: Oral (04/01 0536) BP: 124/58 mmHg (04/01 0536) Pulse Rate: 73 (04/01 0536)  Labs:  Recent Labs  05/19/15 1548 05/20/15 0507  HGB 8.9* 8.3*  HCT 28.0* 26.2*  PLT 140* 182  LABPROT 14.6 15.4*  INR 1.12 1.20  CREATININE 1.09* 1.10*    Estimated Creatinine Clearance: 41.9 mL/min (by C-G formula based on Cr of 1.1).   Assessment: Nicole Meyer s/p R-TKA on 3/31 and started on warfarin post-op for VTE prophylaxis. The patient is being bridged with Lovenox 30 mg bid until INR >/=1.8.  INR today remains SUBtherapeutic (INR 1.2 << 1.12, goal of 2-3). Hgb/Hct low but stable, plts up to wnl. No overt s/sx of bleeding noted.  The patient has been educated on warfarin this admission.  Goal of Therapy:  INR 2-3   Plan:  1. Repeat warfarin 5 mg x 1 dose at 1800 today 2. Will continue to monitor for any signs/symptoms of bleeding and will follow up with PT/INR in the a.m.   Alycia Rossetti, PharmD, BCPS Clinical Pharmacist Pager: [phone] 05/20/2015 3:43 PM

## 2015-05-20 NOTE — Progress Notes (Signed)
Orthopedic Tech Progress Note Patient Details:  Nicole Meyer 30-Apr-1940 [id number]  Patient ID: [name], female   DOB: 1940-12-08, 75 y.o.   MRN: [id number] Placed pt's rle on cpm @ 0-70 DEGREES @1500 ; WILL INCREASE AS PT TOLERATES; RN notified  Hildred Priest 05/20/2015, 3:05 PM

## 2015-05-20 NOTE — Op Note (Signed)
Nicole Meyer, COPPLER NO.:  [id number]  MEDICAL RECORD NO.:  [id number]  LOCATION:  [id number]                        FACILITY:  [facility]  PHYSICIAN:  [name]. [name], [name] DATE OF BIRTH:  18-Oct-1940  DATE OF PROCEDURE:  05/19/2015 DATE OF DISCHARGE:                              OPERATIVE REPORT   PREOPERATIVE DIAGNOSIS:  Right knee end-stage osteoarthritis.  POSTOPERATIVE DIAGNOSIS:  Right knee end-stage osteoarthritis.  PROCEDURE PERFORMED:  Right total knee replacement using DePuy Attune prosthesis.  ATTENDING SURGEON:  [name]. [name], [name]  ASSISTANT:  Abbott Pao. Dixon, PA-C.  ANESTHESIA:  General anesthesia plus femoral block anesthesia was used.  ESTIMATED BLOOD LOSS:  Minimal.  FLUID REPLACEMENT:  1200 mL crystalloid.  INSTRUMENT COUNTS:  Correct.  COMPLICATIONS:  There were no complications.  ANTIBIOTICS:  Perioperative antibiotics were given.  TOURNIQUET TIME:  1 hour at 350 mmHg.  INDICATIONS:  The patient is a 75 year old female with worsening right knee pain secondary to end-stage arthritis.  The patient has bone-on- bone changes on x-rays and has had progressive pain despite conservative management including injections, modification, activity, anti- inflammatories, presents for operative total knee arthroplasty to restore function and eliminate pain to the knee.  Informed consent obtained.  DESCRIPTION OF PROCEDURE:  After an adequate level of anesthesia achieved, the patient was positioned in the supine position.  Right leg correctly identified.  Nonsterile tourniquet was placed on the right proximal thigh.  Right leg was sterilely prepped and draped in usual manner.  After time-out was called, we elevated the leg, exsanguinated using an Esmarch bandage and then flexed the knee.  We performed our longitudinal incision with the knee in flexion.  Dissection down through the subcutaneous tissues.  We identified the medial parapatellar  tissues and performed a medial parapatellar arthrotomy with the fresh #10-blade scalpel.  We everted the patella and divided the lateral patellofemoral ligaments.  We then entered the distal femur with a step-cut drill.  We placed our intramedullary resection guide and resected 9 mm of bone off the right knee, set on 5 degrees right.  Once we did that, we removed ACL, PCL, and meniscal tissue, subluxed the tibia anteriorly and made our perpendicular tibial cut, 9 degrees perpendicular and long axis of the tibia with 3 degrees posterior slope, that was done with an external alignment jig.  Once we made that cut, we then removed the remaining meniscal tissue that we did not get initially.  We then went ahead and went back to the femur.  We sized our femur to a size 4 anterior down and then made our anterior, posterior and chamfer cuts with a 4-in-1 block.  We then went ahead and removed posterior osteophytes with a laminar spreader and posterior capsule.  Next, we directed our attention toward the tibia.  We sized the tibia with size 4 and did our modular drill and keel punch.  We checked our gaps, which were symmetric with a size 6 poly.  Once we completed our tibial preparation, we left the tibial trial tray in place and then we used the box cut guide to resect our box for the posterior cruciate substituting prosthesis for this DePuy Attune system.  We made our cuts and then impacted our size 4 trial prosthesis in place.  We drilled our lug holes.  We then reduced the knee with a +6 poly and had nice flexion and extension, stability and symmetry.  We made resurfaced our patella going from 20-mm thickness down to a 30-mm thickness, drilled our lug holes for the 35 patellar button.  We placed that in place and then ranged the knee and had excellent tracking with no-touch technique.  Once we completed that, we removed all trial components, we irrigated thoroughly.  We then vacuum mixed DePuy  high-viscosity cement on the back table and then cemented all the components in place, size 4 tibia, size 4 right lugged femur and then we placed a size 6 trial, placed the knee in extension and then placed the 35 patellar button in place and then held that with patellar clamp until all cement was hardened on the back table and removed the excess cement with 0.25-inch curved osteotome.  We trialed again with a +8 and that gave [place] better stability in flexion, so we chose the real size 4 8-mm poly and placed that into position and had a nice little snap as we reduced the medial condyle.  We ranged the knee, excellent patellar tracking and nice stability in flexion.  We then irrigated thoroughly with pulse irrigator and then repaired the medial parapatellar arthrotomy with interrupted #1 Vicryl suture followed by 0 Vicryl, 2-0 Vicryl layers, subcutaneous closure and 4-0 running Monocryl for skin.  Steri-Strips applied followed by a sterile dressing.  The patient tolerated the surgery well.     [name]. [name], [name]     SRN/MEDQ  D:  05/19/2015  T:  05/20/2015  Job:  [id number]

## 2015-05-20 NOTE — Evaluation (Signed)
Occupational Therapy Evaluation Patient Details Name: Nicole Meyer MRN: [id number] DOB: [birth year] Today's Date: 05/20/2015    History of Present Illness Patient is a 75 y.o. female with hx of gout, CKD, GERD, COPD, fatigue, iron deficiency anemia, HTN, arthritis, polymyalgia, leukocytopenia, diverticulitis, anxiety, depression, osteoporosis and Hypercholesteremia s/p right TKA.   Clinical Impression   Pt s/p above. Pt independent with ADLs, PTA. Feel pt will benefit from acute OT to increase independence prior to d/c. Recommending SNF for d/c.      Follow Up Recommendations  SNF    Equipment Recommendations  Other (comment) (defer to next venue)    Recommendations for Other Services       Precautions / Restrictions Precautions Precautions: Knee;Fall         Required Braces or Orthoses: Knee Immobilizer - Right Restrictions Weight Bearing Restrictions: Yes RLE Weight Bearing: Weight bearing as tolerated      Mobility Bed Mobility Overal bed mobility: Needs Assistance Bed Mobility: Sit to Supine     Sit to supine: Mod assist   General bed mobility comments: cues for bed mobility. assist with bilateral LEs when returning to bed.  Transfers Overall transfer level: Needs assistance Equipment used: Rolling walker (2 wheeled) Transfers: Sit to/from [facility] Sit to Stand: Mod assist Stand pivot transfers: Mod assist;+2 safety/equipment       General transfer comment: cues given for technique.    Balance    Unsteady with stand pivot transfer-Mod assist (+2 for safety)                                        ADL Overall ADL's : Needs assistance/impaired                     Lower Body Dressing: Sit to/from stand;Maximal assistance;+2 for safety/equipment    Toilet Transfer: Moderate assistance;+2 for safety/equipment;Stand-pivot;RW (from chair to bed)           Functional mobility during ADLs: Moderate  assistance;+2 for safety/equipment;Rolling walker (stand pivot transfer)       Vision     Perception     Praxis      Pertinent Vitals/Pain Pain Assessment: 0-10 Pain Score: 8  Pain Location: Rt knee Pain Descriptors / Indicators: Moaning Pain Intervention(s): Monitored during session;Repositioned     Hand Dominance     Extremity/Trunk Assessment Upper Extremity Assessment Upper Extremity Assessment: Generalized weakness   Lower Extremity Assessment Lower Extremity Assessment: Defer to PT evaluation       Communication Communication Communication: No difficulties   Cognition Arousal/Alertness: Lethargic;Suspect due to medications Behavior During Therapy: [facility] for tasks assessed/performed;Anxious Overall Cognitive Status:  (unsure of baseline-pt lethargic in session) Area of Impairment: Following commands;Attention   Current Attention Level: Sustained Following Commands: Follows one step commands with increased time         General Comments          Shoulder Instructions      Home Living Family/patient expects to be discharged to:: Private residence Living Arrangements: Spouse/significant other Available Help at Discharge: Family;Available 24 hours/day Type of Home: House Home Access: Stairs to enter [employer] of Steps: 3 Entrance Stairs-Rails: Right;Left;Can reach both Home Layout: One level     Bathroom Shower/Tub: Teacher, early years/pre:  (standard and handicapped height)     Home Equipment: Bedside commode;Cane - single point  Prior Functioning/Environment Level of Independence: Independent             OT Diagnosis: Acute pain   OT Problem List: Decreased strength;Decreased range of motion;Impaired balance (sitting and/or standing);Decreased activity tolerance;Decreased knowledge of use of DME or AE;Decreased knowledge of precautions;Pain;Decreased cognition   OT Treatment/Interventions: Patient/family  education;Self-care/ADL training;DME and/or AE instruction;Therapeutic activities;Balance training    OT Goals(Current goals can be found in the care plan section) Acute Rehab OT Goals Patient Stated Goal: not stated OT Goal Formulation: With patient/family Time For Goal Achievement: 05/27/15 Potential to Achieve Goals: Good ADL Goals Pt Will Perform Lower Body Dressing: with min assist;sit to/from stand;with adaptive equipment Pt Will Transfer to Toilet: with min guard assist;ambulating;bedside commode Pt Will Perform Toileting - Clothing Manipulation and hygiene: sit to/from stand;with min assist  OT Frequency: Min 2X/week   Barriers to D/C:            Co-evaluation              End of Session Equipment Utilized During Treatment: Gait belt;Rolling walker;Right knee immobilizer;Oxygen CPM Right Knee CPM Right Knee: Off  Activity Tolerance: Patient limited by pain;Other (comment);Patient limited by lethargy (nauseous) Patient left: in bed;with call bell/phone within reach;with bed alarm set;with family/visitor present   Time: 1100-1119 OT Time Calculation (min): 19 min Charges:  OT General Charges $OT Visit: 1 Procedure OT Evaluation $OT Eval Moderate Complexity: 1 Procedure G-CodesBenito Mccreedy OTR/L [id number] 05/20/2015, 1:07 PM

## 2015-05-20 NOTE — Clinical Social Work Note (Signed)
Clinical Social Worker received referral for possible ST-SNF placement.  Chart reviewed.  PT/OT recommending home with home health.  RN Case Manager to follow up with patient to discuss home health needs.    CSW signing off - please re consult if social work needs arise.  Barbette Or, [place]

## 2015-05-20 NOTE — Progress Notes (Signed)
Orthopedic Tech Progress Note Patient Details:  WHITNEY BRENCHLEY 1941/02/12 [id number]  Patient ID: [name], female   DOB: 1940-03-09, 75 y.o.   MRN: [id number] Pt. refused cpm. Michela Pitcher hadn't slept and just got to sleep. Will call when ready to get in cpm.  Karolee Stamps 05/20/2015, 5:43 AM

## 2015-05-21 LAB — CBC
HEMATOCRIT: 23.5 % — AB (ref 36.0–46.0)
Hemoglobin: 7.9 g/dL — ABNORMAL LOW (ref 12.0–15.0)
MCH: 32.2 pg (ref 26.0–34.0)
MCHC: 33.6 g/dL (ref 30.0–36.0)
MCV: 95.9 fL (ref 78.0–100.0)
PLATELETS: 151 10*3/uL (ref 150–400)
RBC: 2.45 MIL/uL — ABNORMAL LOW (ref 3.87–5.11)
RDW: 13.5 % (ref 11.5–15.5)
WBC: 7.5 10*3/uL (ref 4.0–10.5)

## 2015-05-21 LAB — PROTIME-INR
INR: 1.52 — AB (ref 0.00–1.49)
Prothrombin Time: 18.3 seconds — ABNORMAL HIGH (ref 11.6–15.2)

## 2015-05-21 MED ORDER — WARFARIN SODIUM 5 MG PO TABS
5.0000 mg | ORAL_TABLET | Freq: Once | ORAL | Status: AC
  Administered 2015-05-21: 5 mg via ORAL
  Filled 2015-05-21: qty 1

## 2015-05-21 NOTE — Clinical Social Work Placement (Signed)
   CLINICAL SOCIAL WORK PLACEMENT  NOTE  Date:  05/21/2015  Patient Details  Name: Nicole Meyer MRN: [id number] Date of Birth: Jun 18, 1940  Clinical Social Work is seeking post-discharge placement for this patient at the [place] level of care (*CSW will initial, date and re-position this form in  chart as items are completed):  Yes   Patient/family provided with [place] Work Department's list of facilities offering this level of care within the geographic area requested by the patient (or if unable, by the patient's family).  Yes   Patient/family informed of their freedom to choose among providers that offer the needed level of care, that participate in Medicare, Medicaid or managed care program needed by the patient, have an available bed and are willing to accept the patient.  Yes   Patient/family informed of [facility]'s ownership interest in [facility] and [facility], as well as of the fact that they are under no obligation to receive care at these facilities.  PASRR submitted to EDS on 05/21/15     PASRR number received on 05/21/15     Existing PASRR number confirmed on       FL2 transmitted to all facilities in geographic area requested by pt/family on 05/21/15     FL2 transmitted to all facilities within larger geographic area on       Patient informed that his/her managed care company has contracts with or will negotiate with certain facilities, including the following:            Patient/family informed of bed offers received.  Patient chooses bed at       Physician recommends and patient chooses bed at      Patient to be transferred to   on  .  Patient to be transferred to facility by       Patient family notified on   of transfer.  Name of family member notified:        PHYSICIAN       Additional Comment:    _______________________________________________ Matilde Bash, [place] 05/21/2015, 3:24  PM

## 2015-05-21 NOTE — NC FL2 (Signed)
[place] LEVEL OF CARE SCREENING TOOL     IDENTIFICATION  Patient Name: Nicole Meyer Birthdate: [birth year] Sex: female Admission Date (Current Location): 05/19/2015  [facility] and [place] Number:  Herbalist and Address:  [facility] [place]. [facility], [place] [address] [place], [place] [address]      Provider Number: [id number]  Attending Physician Name and Address:  Netta Cedars, MD  Relative Name and Phone Number:       Current Level of Care: SNF Recommended Level of Care: [place] Prior Approval Number:    Date Approved/Denied:   PASRR Number: [id number]  Discharge Plan: SNF    Current Diagnoses: Patient Active Problem List   Diagnosis Date Noted  . S/P total knee replacement using cement 05/19/2015  . Chronic joint pain 08/30/2013  . Hyperbilirubinemia 05/03/2013  . Abdominal pain 01/03/2013  . Protein-calorie malnutrition, moderate ([place]) 04/10/2012  . Umbilical hernia s/p primary repair 04/08/2012 03/30/2012  . Obesity (BMI 30-39.9) 01/08/2012  . 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 04/08/2012 12/26/2011  . Chest pain, noncardiac - probably due to giant hiatal hernia 12/25/2011  . COPD (chronic obstructive pulmonary disease) ([place]) 12/24/2011  . Lymphopenia 08/23/2011  . Hemolytic anemia ([place])   . Leukocytopenia   . HTN (hypertension)   . Asthma   . IBS (irritable bowel syndrome)   . Arthritis   . Anxiety   . Chronic kidney disease (CKD), stage III (moderate)   . Chronic fatigue     Orientation RESPIRATION BLADDER Height & Weight     Self, Time, Situation  Normal   Weight: 165 lb (74.844 kg) Height:  5\' 2"  (157.5 cm)  BEHAVIORAL SYMPTOMS/MOOD NEUROLOGICAL BOWEL NUTRITION STATUS           AMBULATORY STATUS COMMUNICATION OF NEEDS Skin   Extensive Assist Verbally Surgical wounds                       Personal Care Assistance Level of Assistance  Bathing Bathing Assistance:  Maximum assistance         Functional Limitations Info             SPECIAL CARE FACTORS FREQUENCY                       Contractures Contractures Info: Not present    Additional Factors Info  Allergies   Allergies Info: Penicillins, brovana, Paxil           Current Medications (05/21/2015):  This is the current hospital active medication list Current Facility-Administered Medications  Medication Dose Route Frequency Provider Last Rate Last Dose  . 0.9 %  sodium chloride infusion   Intravenous Continuous Netta Cedars, MD 50 mL/hr at 05/19/15 2122    . acetaminophen (TYLENOL) tablet 650 mg  650 mg Oral Q6H PRN Netta Cedars, MD   650 mg at 05/21/15 0522   Or  . acetaminophen (TYLENOL) suppository 650 mg  650 mg Rectal Q6H PRN Netta Cedars, MD      . albuterol (PROVENTIL) (2.5 MG/3ML) 0.083% nebulizer solution 2.5 mg  2.5 mg Nebulization Q6H PRN Netta Cedars, MD      . ALPRAZolam Duanne Moron) tablet 0.5 mg  0.5 mg Oral Daily Netta Cedars, MD   0.5 mg at 05/21/15 0917  . alum & mag hydroxide-simeth (MAALOX/MYLANTA) 200-200-20 MG/5ML suspension 10 mL  10 mL Oral Q6H PRN Skeet Simmer, RPH      .  aspirin EC tablet 81 mg  81 mg Oral Daily Netta Cedars, MD   81 mg at 05/21/15 [id number]  . calcium-vitamin D (OSCAL WITH D) 500-200 MG-UNIT per tablet 1 tablet  1 tablet Oral BID Netta Cedars, MD   1 tablet at 05/21/15 [phone]  . docusate sodium (COLACE) capsule 100 mg  100 mg Oral BID Netta Cedars, MD   100 mg at 05/21/15 0916  . enoxaparin (LOVENOX) injection 30 mg  30 mg Subcutaneous Q12H Netta Cedars, MD   30 mg at 05/21/15 0917  . famotidine (PEPCID) tablet 20 mg  20 mg Oral Daily Netta Cedars, MD   20 mg at 05/21/15 0917  . ferrous sulfate tablet 325 mg  325 mg Oral TID PC Netta Cedars, MD   325 mg at 05/21/15 1241  . folic acid (FOLVITE) tablet 1 mg  1 mg Oral Daily Netta Cedars, MD   1 mg at 05/21/15 0917  . HYDROmorphone (DILAUDID) injection 0.5-1 mg  0.5-1 mg Intravenous Q2H PRN Netta Cedars, MD   1 mg at 05/20/15 0651  . lactated ringers infusion   Intravenous Continuous Lauretta Grill, MD 10 mL/hr at 05/19/15 [phone]    . lisinopril (PRINIVIL,ZESTRIL) tablet 10 mg  10 mg Oral Daily Netta Cedars, MD   10 mg at 05/21/15 0917  . menthol-cetylpyridinium (CEPACOL) lozenge 3 mg  1 lozenge Oral PRN Netta Cedars, MD       Or  . phenol (CHLORASEPTIC) mouth spray 1 spray  1 spray Mouth/Throat PRN Netta Cedars, MD      . methocarbamol (ROBAXIN) tablet 500 mg  500 mg Oral Q6H PRN Netta Cedars, MD   500 mg at 05/20/15 1512   Or  . methocarbamol (ROBAXIN) 500 mg in dextrose 5 % 50 mL IVPB  500 mg Intravenous Q6H PRN Netta Cedars, MD      . metoCLOPramide (REGLAN) tablet 5-10 mg  5-10 mg Oral Q8H PRN Netta Cedars, MD       Or  . metoCLOPramide (REGLAN) injection 5-10 mg  5-10 mg Intravenous Q8H PRN Netta Cedars, MD      . multivitamin (PROSIGHT) tablet 1 tablet  1 tablet Oral Daily Netta Cedars, MD   1 tablet at 05/21/15 [phone]  . multivitamin with minerals tablet 1 tablet  1 tablet Oral Daily Netta Cedars, MD   1 tablet at 05/21/15 0916  . ondansetron (ZOFRAN) tablet 4 mg  4 mg Oral Q6H PRN Netta Cedars, MD   4 mg at 05/20/15 2159   Or  . ondansetron [facility]) injection 4 mg  4 mg Intravenous Q6H PRN Netta Cedars, MD   4 mg at 05/20/15 [id number]  . ondansetron (ZOFRAN-ODT) disintegrating tablet 4 mg  4 mg Oral Q8H PRN Netta Cedars, MD      . oxyCODONE (Oxy IR/ROXICODONE) immediate release tablet 5-10 mg  5-10 mg Oral Q3H PRN Netta Cedars, MD   10 mg at 05/21/15 1241  . polyethylene glycol (MIRALAX / GLYCOLAX) packet 17 g  17 g Oral Daily PRN Netta Cedars, MD      . predniSONE (DELTASONE) tablet 5 mg  5 mg Oral Q breakfast Netta Cedars, MD   5 mg at 05/21/15 0917  . vitamin B-12 (CYANOCOBALAMIN) tablet 500 mcg  500 mcg Oral Daily Netta Cedars, MD   500 mcg at 05/21/15 [phone]  . warfarin (COUMADIN) tablet 5 mg  5 mg Oral ONCE-1800 Lyndee Leo, [facility]      . warfarin (COUMADIN) video  Does not apply Once  Jens Som, [facility]      . Warfarin - Pharmacist Dosing Inpatient   Does not apply [place], [facility]         Discharge Medications: Please see discharge summary for a list of discharge medications.  Relevant Imaging Results:  Relevant Lab Results:   Additional Information    Moshe Cipro Berneice Heinrich, LCSW

## 2015-05-21 NOTE — Clinical Social Work Note (Signed)
Clinical Social Work Assessment  Patient Details  Name: Nicole Meyer MRN: [id number] Date of Birth: 04-04-1940  Date of referral:  05/21/15               Reason for consult:  Facility Placement                Permission sought to share information with:  Family Supports Permission granted to share information::  Yes, Verbal Permission Granted  Name::     Nicole Meyer  Agency::  [facility]  Relationship::  husband  Contact Information:     Housing/Transportation Living arrangements for the past 2 months:  Single Family Home Source of Information:  Patient, Adult Children, Spouse Patient Interpreter Needed:  None Criminal Activity/Legal Involvement Pertinent to Current Situation/Hospitalization:  No - Comment as needed Significant Relationships:  Adult Children, Spouse Lives with:  Spouse Do you feel safe going back to the place where you live?  Yes Need for family participation in patient care:  No (Coment)  Care giving concerns:  None, at this time.  Social Worker assessment / plan:  Met with Pt, husband, daughter and family friend.  Pt voiced her disappointment with not being able to go home but she stated that she just doesn't think that she's ready.  Her husband agreed.  MSW answered questions about SNF placement.  Family asked that MSW search [facility] SNFs, with the hopes that [place] will be an option.  MSW provided family with a SNF list.  Employment status:  Retired Nurse, adult PT Recommendations:  [place] / Referral to community resources:   SNF list  Patient/Family's Response to care:  Pt and family disappointed that Pt can't d/c home yet.  They do agree, though, that Pt just isn't ready.  Patient/Family's Understanding of and Emotional Response to Diagnosis, Current Treatment, and Prognosis:  All are on board with SNF.  Emotional  Assessment Appearance:  Appears stated age Attitude/Demeanor/Rapport:   (calm) Affect (typically observed):  Accepting Orientation:  Oriented to Self, Oriented to Place, Oriented to  Time, Oriented to Situation Alcohol / Substance use:  Never Used Psych involvement (Current and /or in the community):  No (Comment)  Discharge Needs  Concerns to be addressed:  No discharge needs identified Readmission within the last 30 days:  No Current discharge risk:  None Barriers to Discharge:  No Barriers Identified   Matilde Bash, [place] 05/21/2015, 2:50 PM

## 2015-05-21 NOTE — Clinical Social Work Placement (Signed)
   CLINICAL SOCIAL WORK PLACEMENT  NOTE  Date:  05/21/2015  Patient Details  Name: Nicole Meyer MRN: [id number] Date of Birth: Jun 25, 1940  Clinical Social Work is seeking post-discharge placement for this patient at the [place] level of care (*CSW will initial, date and re-position this form in  chart as items are completed):  Yes   Patient/family provided with [place] Work Department's list of facilities offering this level of care within the geographic area requested by the patient (or if unable, by the patient's family).  Yes   Patient/family informed of their freedom to choose among providers that offer the needed level of care, that participate in Medicare, Medicaid or managed care program needed by the patient, have an available bed and are willing to accept the patient.  Yes   Patient/family informed of [facility]'s ownership interest in [facility] and [facility], as well as of the fact that they are under no obligation to receive care at these facilities.  PASRR submitted to EDS on 05/21/15     PASRR number received on 05/21/15     Existing PASRR number confirmed on       FL2 transmitted to all facilities in geographic area requested by pt/family on       FL2 transmitted to all facilities within larger geographic area on       Patient informed that his/her managed care company has contracts with or will negotiate with certain facilities, including the following:            Patient/family informed of bed offers received.  Patient chooses bed at       Physician recommends and patient chooses bed at      Patient to be transferred to   on  .  Patient to be transferred to facility by       Patient family notified on   of transfer.  Name of family member notified:        PHYSICIAN       Additional Comment:    _______________________________________________ Matilde Bash, [place] 05/21/2015, 3:01 PM

## 2015-05-21 NOTE — Progress Notes (Signed)
   Subjective:  Patient reports pain as mild to moderate.  Slow to progress with PT.  Objective:   VITALS:   Filed Vitals:   05/20/15 0536 05/20/15 1542 05/20/15 2219 05/21/15 0659  BP: 124/58 126/62 150/54 145/52  Pulse: 73 74 95 92  Temp: 100.1 F (37.8 C) 98.9 F (37.2 C) 100.2 F (37.9 C) 98.9 F (37.2 C)  TempSrc: Oral Oral Oral Oral  Resp: 18 18 18 18   Height:      Weight:      SpO2: 100% 99% 94% 95%    ABD soft Sensation intact distally Intact pulses distally Dorsiflexion/Plantar flexion intact Incision: scant drainage Compartment soft dressing changed. wound benign   Lab Results  Component Value Date   WBC 7.5 05/21/2015   HGB 7.9* 05/21/2015   HCT 23.5* 05/21/2015   MCV 95.9 05/21/2015   PLT 151 05/21/2015   BMET    Component Value Date/Time   NA 136 05/20/2015 0507   NA 139 08/01/2014 1031   K 4.3 05/20/2015 0507   K 4.6 08/01/2014 1031   CL 106 05/20/2015 0507   CL 107 07/06/2012 1005   CO2 23 05/20/2015 0507   CO2 23 08/01/2014 1031   GLUCOSE 148* 05/20/2015 0507   GLUCOSE 122 08/01/2014 1031   GLUCOSE 97 07/06/2012 1005   BUN 13 05/20/2015 0507   BUN 21.4 08/01/2014 1031   CREATININE 1.10* 05/20/2015 0507   CREATININE 1.3* 08/01/2014 1031   CALCIUM 8.9 05/20/2015 0507   CALCIUM 9.6 08/01/2014 1031   GFRNONAA 48* 05/20/2015 0507   GFRAA 55* 05/20/2015 0507     Assessment/Plan: 2 Days Post-Op   Active Problems:   S/P total knee replacement using cement   Up with therapy Chemical DVT ppx PT/OT Dispo: d/c home after clears therapy   Lochlann Mastrangelo, Horald Pollen 05/21/2015, 12:07 PM   Rod Can, MD Cell [phone]

## 2015-05-21 NOTE — Progress Notes (Signed)
ANTICOAGULATION CONSULT NOTE - Follow Up Consult  Pharmacy Consult for Warfarin Indication: VTE prophylaxis s/p R-TKA on 3/31  Allergies  Allergen Reactions  . Penicillins Hives    Last reaction was at 35   . Brovana [Arformoterol] Other (See Comments)    "makes me nervous"  . Paxil [Paroxetine Hcl] Rash    Patient Measurements: Height: 5\' 2"  (157.5 cm) Weight: 165 lb (74.844 kg) IBW/kg (Calculated) : 50.1  Vital Signs: Temp: 98.9 F (37.2 C) (04/02 0659) Temp Source: Oral (04/02 0659) BP: 145/52 mmHg (04/02 0659) Pulse Rate: 92 (04/02 0659)  Labs:  Recent Labs  05/19/15 1548 05/20/15 0507 05/21/15 0555  HGB 8.9* 8.3* 7.9*  HCT 28.0* 26.2* 23.5*  PLT 140* 182 151  LABPROT 14.6 15.4* 18.3*  INR 1.12 1.20 1.52*  CREATININE 1.09* 1.10*  --     Estimated Creatinine Clearance: 41.9 mL/min (by C-G formula based on Cr of 1.1).   Assessment: 26 YOF s/p R-TKA on 3/31 and started on warfarin post-op for VTE prophylaxis. The patient is being bridged with Lovenox 30 mg bid until INR >/=1.8.  INR today remains SUBtherapeutic (INR 1.52 << 1.2, goal of 2-3). Hgb/Hct low but stable, plts wnl. No overt s/sx of bleeding noted.  The patient has been educated on warfarin this admission.  Goal of Therapy:  INR 2-3   Plan:  1. Repeat warfarin 5 mg x 1 dose at 1800 today 2. Will continue to monitor for any signs/symptoms of bleeding and will follow up with PT/INR in the a.m.   Erin Hearing PharmD., BCPS Clinical Pharmacist Pager [phone] 05/21/2015 9:53 AM

## 2015-05-21 NOTE — Progress Notes (Signed)
Physical Therapy Treatment Patient Details Name: Nicole Meyer MRN: [id number] DOB: 10-01-1940 Today's Date: 05/21/2015    History of Present Illness Patient is a 75 y.o. female with hx of gout, CKD, GERD, COPD, fatigue, iron deficiency anemia, HTN, arthritis, polymyalgia, leukocytopenia, diverticulitis, anxiety, depression, osteoporosis and Hypercholesteremia s/p right TKA.    PT Comments    At this time the patient is progressing slowly with PT regarding mobility. She has been able to ambulate 7 feet with min assistance. Pt reporting that she is hoping to be able to go home but states that she is unsure that if she would be able to get into her home. Based upon the patient's current mobility level, recommending shor term SNF for further rehabilitation prior to returning to home.   Follow Up Recommendations  SNF     Equipment Recommendations  None recommended by PT (to be addressed at next venue)    Recommendations for Other Services       Precautions / Restrictions Precautions Precautions: Knee;Fall Precaution Booklet Issued: Yes (comment) Precaution Comments: HEP provided Required Braces or Orthoses: Knee Immobilizer - Right Restrictions Weight Bearing Restrictions: Yes RLE Weight Bearing: Weight bearing as tolerated    Mobility  Bed Mobility Overal bed mobility: Needs Assistance Bed Mobility: Supine to Sit     Supine to sit: Min assist     General bed mobility comments: min assist provided with Rt LE. Bed flat no rail.   Transfers Overall transfer level: Needs assistance Equipment used: Rolling walker (2 wheeled) Transfers: Sit to/from Stand Sit to Stand: Min assist         General transfer comment: cues for hand position  Ambulation/Gait Ambulation/Gait assistance: Min assist Ambulation Distance (Feet): 7 Feet Assistive device: Rolling walker (2 wheeled) Gait Pattern/deviations: Step-to pattern;Decreased step length - right;Decreased step length -  left;Trunk flexed Gait velocity: very slow pattern   General Gait Details: Heavy cues needed for gait sequence throughout and assist to  move rw. Initial assist needed to advance Rt LE forward. Pt fatiguing quickly and chair brought from behind for pt to sit.    Stairs            Wheelchair Mobility    Modified Rankin (Stroke Patients Only)       Balance Overall balance assessment: Needs assistance Sitting-balance support: No upper extremity supported Sitting balance-Leahy Scale: Fair     Standing balance support: Bilateral upper extremity supported Standing balance-Leahy Scale: Poor Standing balance comment: using rw                    Cognition Arousal/Alertness: Awake/alert Behavior During Therapy: WFL for tasks assessed/performed;Anxious Overall Cognitive Status: Within Functional Limits for tasks assessed                      Exercises Total Joint Exercises Ankle Circles/Pumps: Both;10 reps;Supine Quad Sets: Both;10 reps;Supine Heel Slides: AAROM;Right;10 reps Straight Leg Raises: Strengthening;Right;10 reps;Other (comment) (max assist needed. ) Goniometric ROM: approx 5-40 degrees    General Comments        Pertinent Vitals/Pain Pain Assessment: 0-10 Pain Score: 8  Pain Location: Rt knee Pain Descriptors / Indicators: Aching;Sore Pain Intervention(s): Monitored during session;RN gave pain meds during session    Home Living                      Prior Function            PT Goals (current goals  can now be found in the care plan section) Acute Rehab PT Goals Patient Stated Goal: go home PT Goal Formulation: With patient Time For Goal Achievement: 06/02/15 Potential to Achieve Goals: Fair Progress towards PT goals: Progressing toward goals    Frequency  7X/week    PT Plan Current plan remains appropriate    Co-evaluation             End of Session Equipment Utilized During Treatment: Gait belt;Right knee  immobilizer Activity Tolerance: Patient tolerated treatment well;Patient limited by fatigue Patient left: in chair;with call bell/phone within reach;with family/visitor present     Time: 0910-0943 PT Time Calculation (min) (ACUTE ONLY): 33 min  Charges:  $Gait Training: 8-22 mins $Therapeutic Exercise: 8-22 mins                    G Codes:      Cassell Clement, PT, CSCS Pager [phone] Office [phone]  05/21/2015, 10:48 AM

## 2015-05-22 ENCOUNTER — Encounter (HOSPITAL_COMMUNITY): Payer: Self-pay | Admitting: Orthopedic Surgery

## 2015-05-22 LAB — CBC
HEMATOCRIT: 21.5 % — AB (ref 36.0–46.0)
HEMOGLOBIN: 7.2 g/dL — AB (ref 12.0–15.0)
MCH: 32.1 pg (ref 26.0–34.0)
MCHC: 33.5 g/dL (ref 30.0–36.0)
MCV: 96 fL (ref 78.0–100.0)
Platelets: 161 10*3/uL (ref 150–400)
RBC: 2.24 MIL/uL — ABNORMAL LOW (ref 3.87–5.11)
RDW: 13.5 % (ref 11.5–15.5)
WBC: 6.6 10*3/uL (ref 4.0–10.5)

## 2015-05-22 LAB — PROTIME-INR
INR: 1.69 — ABNORMAL HIGH (ref 0.00–1.49)
Prothrombin Time: 19.8 seconds — ABNORMAL HIGH (ref 11.6–15.2)

## 2015-05-22 LAB — PREPARE RBC (CROSSMATCH)

## 2015-05-22 MED ORDER — FUROSEMIDE 10 MG/ML IJ SOLN
20.0000 mg | Freq: Once | INTRAMUSCULAR | Status: AC
  Administered 2015-05-22: 20 mg via INTRAVENOUS
  Filled 2015-05-22: qty 2

## 2015-05-22 MED ORDER — WARFARIN SODIUM 5 MG PO TABS: 5.0000 mg | ORAL_TABLET | Freq: Once | ORAL | Status: DC

## 2015-05-22 MED ORDER — DIPHENHYDRAMINE HCL 25 MG PO CAPS
25.0000 mg | ORAL_CAPSULE | Freq: Once | ORAL | Status: AC
  Administered 2015-05-22: 25 mg via ORAL
  Filled 2015-05-22: qty 1

## 2015-05-22 MED ORDER — SODIUM CHLORIDE 0.9 % IV SOLN: Freq: Once | INTRAVENOUS | Status: DC

## 2015-05-22 MED ORDER — ACETAMINOPHEN 325 MG PO TABS
650.0000 mg | ORAL_TABLET | Freq: Once | ORAL | Status: AC
  Administered 2015-05-22: 650 mg via ORAL
  Filled 2015-05-22: qty 2

## 2015-05-22 NOTE — Progress Notes (Signed)
Called report to [employer] at [facility]. Audie Box, RN

## 2015-05-22 NOTE — Progress Notes (Signed)
ANTICOAGULATION CONSULT NOTE - Follow Up Consult  Pharmacy Consult for Warfarin Indication: VTE prophylaxis s/p R-TKA on 3/31  Allergies  Allergen Reactions  . Penicillins Hives    Last reaction was at 35   . Brovana [Arformoterol] Other (See Comments)    "makes me nervous"  . Paxil [Paroxetine Hcl] Rash    Patient Measurements: Height: 5\' 2"  (157.5 cm) Weight: 165 lb (74.844 kg) IBW/kg (Calculated) : 50.1  Vital Signs: Temp: 99.1 F (37.3 C) (04/03 1054) Temp Source: Oral (04/03 1054) BP: 111/55 mmHg (04/03 1054) Pulse Rate: 83 (04/03 1054)  Labs:  Recent Labs  05/19/15 1548 05/20/15 0507 05/21/15 0555 05/22/15 0352  HGB 8.9* 8.3* 7.9* 7.2*  HCT 28.0* 26.2* 23.5* 21.5*  PLT 140* 182 151 161  LABPROT 14.6 15.4* 18.3* 19.8*  INR 1.12 1.20 1.52* 1.69*  CREATININE 1.09* 1.10*  --   --     Estimated Creatinine Clearance: 41.9 mL/min (by C-G formula based on Cr of 1.1).   Assessment: 29 YOF s/p R-TKA on 3/31 and started on warfarin post-op for VTE prophylaxis. The patient is being bridged with Lovenox 30 mg bid until INR >/=1.8.  INR today remains SUBtherapeutic (INR 1.2>>1.52>>1.69, goal of 2-3). Hgb/Hct low but stable, plts wnl. No overt s/sx of bleeding noted.  The patient has been educated on warfarin this admission.  Goal of Therapy:  INR 2-3   Plan:  Warfarin 5mg  tonight x1 Daily INR Monitor s/sx of bleeding   Andrey Cota. Diona Deterding, PharmD, [place] Clinical Pharmacist Pager [phone]  05/22/2015 11:21 AM

## 2015-05-22 NOTE — Progress Notes (Signed)
Patient no longer meets criteria for requiring precautions due to history of ESBL (2014). RN notified   Luther Hearing RN BSN Infection Prevention

## 2015-05-22 NOTE — Clinical Social Work Note (Addendum)
PTAR arranged for 5PM.  Clinical Social Worker will sign off for now as social work intervention is no longer needed. Please consult [place] again if new need arises.  Glendon Axe, MSW, LCSWA [phone] 05/22/2015 2:36 PM

## 2015-05-22 NOTE — Discharge Summary (Signed)
Physician Discharge Summary   Patient ID: Nicole Meyer MRN: [id number] DOB/AGE: July 24, 1940 75 y.o.  Admit date: 05/19/2015 Discharge date: 05/22/2015  Admission Diagnoses:  Active Problems:   S/P total knee replacement using cement   Discharge Diagnoses:  Same   Surgeries: Procedure(s): RIGHT TOTAL KNEE ARTHROPLASTY on 05/19/2015   Consultants: PT, OT, Social Work  Discharged Condition: Stable  Hospital Course: Nicole Meyer is an 75 y.o. female who was admitted 05/19/2015 with a chief complaint of right knee pain, and found to have a diagnosis of primary OA right knee.  They were brought to the operating room on 05/19/2015 and underwent the above named procedures.    The patient had an uncomplicated hospital course and was stable for discharge.  Recent vital signs:  Filed Vitals:   05/21/15 2112 05/22/15 0643  BP: 150/63 148/60  Pulse: 109 91  Temp: 99.5 F (37.5 C) 98.7 F (37.1 C)  Resp: 19 16    Recent laboratory studies:  Results for orders placed or performed during the hospital encounter of 05/19/15  CBC  Result Value Ref Range   WBC 5.8 4.0 - 10.5 K/uL   RBC 2.92 (L) 3.87 - 5.11 MIL/uL   Hemoglobin 8.9 (L) 12.0 - 15.0 g/dL   HCT 28.0 (L) 36.0 - 46.0 %   MCV 95.9 78.0 - 100.0 fL   MCH 30.5 26.0 - 34.0 pg   MCHC 31.8 30.0 - 36.0 g/dL   RDW 13.5 11.5 - 15.5 %   Platelets 140 (L) 150 - 400 K/uL  Creatinine, serum  Result Value Ref Range   Creatinine, Ser 1.09 (H) 0.44 - 1.00 mg/dL   GFR calc non Af Amer 48 (L) >60 mL/min   GFR calc Af Amer 56 (L) >60 mL/min  Protime-INR  Result Value Ref Range   Prothrombin Time 14.6 11.6 - 15.2 seconds   INR 1.12 0.00 - 1.49  Protime-INR  Result Value Ref Range   Prothrombin Time 15.4 (H) 11.6 - 15.2 seconds   INR 1.20 0.00 - 1.49  CBC  Result Value Ref Range   WBC 7.4 4.0 - 10.5 K/uL   RBC 2.71 (L) 3.87 - 5.11 MIL/uL   Hemoglobin 8.3 (L) 12.0 - 15.0 g/dL   HCT 26.2 (L) 36.0 - 46.0 %   MCV 96.7 78.0 -  100.0 fL   MCH 30.6 26.0 - 34.0 pg   MCHC 31.7 30.0 - 36.0 g/dL   RDW 13.7 11.5 - 15.5 %   Platelets 182 150 - 400 K/uL  Basic metabolic panel  Result Value Ref Range   Sodium 136 135 - 145 mmol/L   Potassium 4.3 3.5 - 5.1 mmol/L   Chloride 106 101 - 111 mmol/L   CO2 23 22 - 32 mmol/L   Glucose, Bld 148 (H) 65 - 99 mg/dL   BUN 13 6 - 20 mg/dL   Creatinine, Ser 1.10 (H) 0.44 - 1.00 mg/dL   Calcium 8.9 8.9 - 10.3 mg/dL   GFR calc non Af Amer 48 (L) >60 mL/min   GFR calc Af Amer 55 (L) >60 mL/min   Anion gap 7 5 - 15  Protime-INR  Result Value Ref Range   Prothrombin Time 18.3 (H) 11.6 - 15.2 seconds   INR 1.52 (H) 0.00 - 1.49  CBC  Result Value Ref Range   WBC 7.5 4.0 - 10.5 K/uL   RBC 2.45 (L) 3.87 - 5.11 MIL/uL   Hemoglobin 7.9 (L) 12.0 - 15.0 g/dL  HCT 23.5 (L) 36.0 - 46.0 %   MCV 95.9 78.0 - 100.0 fL   MCH 32.2 26.0 - 34.0 pg   MCHC 33.6 30.0 - 36.0 g/dL   RDW 13.5 11.5 - 15.5 %   Platelets 151 150 - 400 K/uL  Protime-INR  Result Value Ref Range   Prothrombin Time 19.8 (H) 11.6 - 15.2 seconds   INR 1.69 (H) 0.00 - 1.49  CBC  Result Value Ref Range   WBC 6.6 4.0 - 10.5 K/uL   RBC 2.24 (L) 3.87 - 5.11 MIL/uL   Hemoglobin 7.2 (L) 12.0 - 15.0 g/dL   HCT 21.5 (L) 36.0 - 46.0 %   MCV 96.0 78.0 - 100.0 fL   MCH 32.1 26.0 - 34.0 pg   MCHC 33.5 30.0 - 36.0 g/dL   RDW 13.5 11.5 - 15.5 %   Platelets 161 150 - 400 K/uL    Discharge Medications:     Medication List    TAKE these medications        albuterol 108 (90 Base) MCG/ACT inhaler  Commonly known as:  PROVENTIL HFA;VENTOLIN HFA  Inhale 2 puffs into the lungs every 6 (six) hours as needed for wheezing or shortness of breath.     ALPRAZolam 0.5 MG tablet  Commonly known as:  XANAX  Take 0.5 mg by mouth every morning.     aluminum-magnesium hydroxide 200-200 MG/5ML suspension  Take 10 mLs by mouth every 6 (six) hours as needed for indigestion.     aspirin EC 81 MG tablet  Take 81 mg by mouth daily.      beta carotene w/minerals tablet  Take 1 tablet by mouth daily.     calcium-vitamin D 500-200 MG-UNIT tablet  Commonly known as:  OSCAL WITH D  Take 1 tablet by mouth 2 (two) times daily.     Cranberry 500 MG Caps  Take 500 mg by mouth daily.     ESTROVEN PO  Take 1 tablet by mouth daily.     folic acid 1 MG tablet  Commonly known as:  FOLVITE  Take 1 mg by mouth daily.     lisinopril 10 MG tablet  Commonly known as:  PRINIVIL  Take 1 tablet (10 mg total) by mouth daily.     methocarbamol 500 MG tablet  Commonly known as:  ROBAXIN  Take 1 tablet (500 mg total) by mouth 3 (three) times daily as needed.     multivitamin tablet  Take 1 tablet by mouth daily.     nitrofurantoin 100 MG capsule  Commonly known as:  MACRODANTIN  Take 100 mg by mouth 2 (two) times daily.     ondansetron 4 MG disintegrating tablet  Commonly known as:  ZOFRAN-ODT  Take 4 mg by mouth every 8 (eight) hours as needed.     oxyCODONE-acetaminophen 5-325 MG tablet  Commonly known as:  ROXICET  Take 1-2 tablets by mouth every 4 (four) hours as needed for severe pain.     predniSONE 10 MG tablet  Commonly known as:  DELTASONE  Take 2 tablets (20 mg total) by mouth daily with breakfast.     ranitidine 150 MG tablet  Commonly known as:  ZANTAC  Take 150 mg by mouth daily.     vitamin B-12 500 MCG tablet  Commonly known as:  CYANOCOBALAMIN  Take 500 mcg by mouth daily.     warfarin 5 MG tablet  Commonly known as:  COUMADIN  Take 1 tablet (5 mg  total) by mouth daily. Take as directed per the pharmacist for INR target from 2.5-3.0 for 30 days post op        Diagnostic Studies: No results found.  Disposition: SNF Rehab      Discharge Instructions    Weight bearing as tolerated    Complete by:  As directed   Laterality:  right  Extremity:  Lower           Follow-up Information    Follow up with Guerin Lashomb,STEVEN R, MD. Call in 2 weeks.   Specialty:  Orthopedic Surgery   Why:  [phone]     Contact information:   [address] [place] [address] [phone]        Signed: Augustin Schooling 05/22/2015, 7:21 AM

## 2015-05-22 NOTE — Progress Notes (Signed)
Orthopedics Progress Note  Subjective: Patient feeling better today  Objective:  Filed Vitals:   05/21/15 2112 05/22/15 0643  BP: 150/63 148/60  Pulse: 109 91  Temp: 99.5 F (37.5 C) 98.7 F (37.1 C)  Resp: 19 16    General: Awake and alert  Musculoskeletal: patient resting comfortably in bed, knee is moderately swollen. Pt able to do an ankle pump without pain.  Some pain with AAROM of the knee Neurovascularly intact  Lab Results  Component Value Date   WBC 6.6 05/22/2015   HGB 7.2* 05/22/2015   HCT 21.5* 05/22/2015   MCV 96.0 05/22/2015   PLT 161 05/22/2015       Component Value Date/Time   NA 136 05/20/2015 0507   NA 139 08/01/2014 1031   K 4.3 05/20/2015 0507   K 4.6 08/01/2014 1031   CL 106 05/20/2015 0507   CL 107 07/06/2012 1005   CO2 23 05/20/2015 0507   CO2 23 08/01/2014 1031   GLUCOSE 148* 05/20/2015 0507   GLUCOSE 122 08/01/2014 1031   GLUCOSE 97 07/06/2012 1005   BUN 13 05/20/2015 0507   BUN 21.4 08/01/2014 1031   CREATININE 1.10* 05/20/2015 0507   CREATININE 1.3* 08/01/2014 1031   CALCIUM 8.9 05/20/2015 0507   CALCIUM 9.6 08/01/2014 1031   GFRNONAA 48* 05/20/2015 0507   GFRAA 55* 05/20/2015 0507    Lab Results  Component Value Date   INR 1.69* 05/22/2015   INR 1.52* 05/21/2015   INR 1.20 05/20/2015    Assessment/Plan: POD #3 s/p Procedure(s): RIGHT TOTAL KNEE ARTHROPLASTY Acute blood loss anemia. Patient will need transfusion prior to transfer to SNF rehab.  Agree with SNF due to slow progress with therapy.\ Need to work on flexion and extension (use immobilizer at night to maintain extension) FL-2 signed D/C planning- appreciate SW support   [name]. [name], MD 05/22/2015 7:16 AM

## 2015-05-22 NOTE — Clinical Social Work Note (Signed)
Patient currently receiving 2 units of blood-once completed can be discharged. Pt's husband present at bedside.   Clinical Social Worker facilitated patient discharge including contacting patient family and facility to confirm patient discharge plans.  Clinical information faxed to facility and family agreeable with plan.  CSW arranged ambulance transport via [facility] to [place].  RN to call report prior to discharge.  Clinical Social Worker will sign off for now as social work intervention is no longer needed. Please consult [place] again if new need arises.  Glendon Axe, MSW, LCSWA [phone] 05/22/2015 12:29 PM

## 2015-05-22 NOTE — Clinical Social Work Placement (Signed)
   CLINICAL SOCIAL WORK PLACEMENT  NOTE  Date:  05/22/2015  Patient Details  Name: CLOTENE HISER MRN: [id number] Date of Birth: 1940-03-04  Clinical Social Work is seeking post-discharge placement for this patient at the [place] level of care (*CSW will initial, date and re-position this form in  chart as items are completed):  Yes   Patient/family provided with [facility] Work Department's list of facilities offering this level of care within the geographic area requested by the patient (or if unable, by the patient's family).  Yes   Patient/family informed of their freedom to choose among providers that offer the needed level of care, that participate in Medicare, Medicaid or managed care program needed by the patient, have an available bed and are willing to accept the patient.  Yes   Patient/family informed of [facility]'s ownership interest in [facility] and [facility], as well as of the fact that they are under no obligation to receive care at these facilities.  PASRR submitted to EDS on 05/21/15     PASRR number received on 05/21/15     Existing PASRR number confirmed on       FL2 transmitted to all facilities in geographic area requested by pt/family on 05/21/15     FL2 transmitted to all facilities within larger geographic area on       Patient informed that his/her managed care company has contracts with or will negotiate with certain facilities, including the following:        Yes   Patient/family informed of bed offers received.  Patient chooses bed at  [facility]     Physician recommends and patient chooses bed at      Patient to be transferred to  [facility] on 05/22/15.  Patient to be transferred to facility by  [facility] )     Patient family notified on 05/22/15 of transfer.  Name of family member notified:   (Pt's husband, Eddie Dibbles at bedside)     PHYSICIAN Please prepare priority  discharge summary, including medications, Please sign FL2     Additional Comment:    _______________________________________________ Rozell Searing, LCSW 05/22/2015, 12:27 PM

## 2015-05-22 NOTE — Progress Notes (Signed)
Discharge papers explained and medications pt ready for [facility]. Nicole Meyer'

## 2015-05-22 NOTE — Progress Notes (Signed)
Physical Therapy Treatment Patient Details Name: Nicole Meyer MRN: [id number] DOB: [birth year] Today's Date: 05/22/2015    History of Present Illness Patient is a 75 y.o. female with hx of gout, CKD, GERD, COPD, fatigue, iron deficiency anemia, HTN, arthritis, polymyalgia, leukocytopenia, diverticulitis, anxiety, depression, osteoporosis and Hypercholesteremia s/p right TKA.    PT Comments    Patient making gradual progression toward mobility goals. Current plan remains appropriate.   Follow Up Recommendations  SNF     Equipment Recommendations  None recommended by PT (to be addressed at next venue)    Recommendations for Other Services       Precautions / Restrictions Precautions Precautions: Knee;Fall Precaution Booklet Issued: Yes (comment) Precaution Comments: HEP provided Required Braces or Orthoses: Knee Immobilizer - Right Restrictions Weight Bearing Restrictions: Yes RLE Weight Bearing: Weight bearing as tolerated    Mobility  Bed Mobility Overal bed mobility: Needs Assistance Bed Mobility: Supine to Sit     Supine to sit: Min assist     General bed mobility comments: min A to bring R LE to EOB with HOB flat and no use of bedrails  Transfers Overall transfer level: Needs assistance Equipment used: Rolling walker (2 wheeled) Transfers: Sit to/from Stand Sit to Stand: Mod assist         General transfer comment: cues for hand placement and technique with increased time needed to transition hand placment from EOB to RW and achieve erect posture  Ambulation/Gait Ambulation/Gait assistance: Min assist Ambulation Distance (Feet): 15 Feet Assistive device: Rolling walker (2 wheeled) Gait Pattern/deviations: Step-to pattern;Decreased step length - left;Decreased stance time - right;Decreased weight shift to right;Antalgic;Trunk flexed Gait velocity: very slow pattern   General Gait Details: cues for posture, sequencing of gait with use of AD, and weight  shifting to adance bilat LE forward   Stairs            Wheelchair Mobility    Modified Rankin (Stroke Patients Only)       Balance Overall balance assessment: Needs assistance Sitting-balance support: Single extremity supported;Feet supported Sitting balance-Leahy Scale: Fair     Standing balance support: Bilateral upper extremity supported Standing balance-Leahy Scale: Poor                      Cognition Arousal/Alertness: Lethargic;Suspect due to medications Behavior During Therapy: [facility] for tasks assessed/performed;Anxious Overall Cognitive Status: Within Functional Limits for tasks assessed                      Exercises Total Joint Exercises Heel Slides: AAROM;Right;Other (comment) (X2; pt too lethargic to perform therex) Goniometric ROM: 5-40    General Comments        Pertinent Vitals/Pain Pain Assessment: Faces Faces Pain Scale: Hurts even more Pain Location: R knee during ambulation Pain Descriptors / Indicators: Grimacing;Moaning;Sharp Pain Intervention(s): Monitored during session;Premedicated before session;Limited activity within patient's tolerance;Repositioned    Home Living                      Prior Function            PT Goals (current goals can now be found in the care plan section) Acute Rehab PT Goals Patient Stated Goal: none stated PT Goal Formulation: With patient Time For Goal Achievement: 06/02/15 Potential to Achieve Goals: Fair Progress towards PT goals: Progressing toward goals    Frequency  7X/week    PT Plan Current plan remains appropriate  Co-evaluation             End of Session Equipment Utilized During Treatment: Gait belt;Right knee immobilizer Activity Tolerance: Patient limited by lethargy Patient left: in chair;with call bell/phone within reach;with family/visitor present     Time: 1440-1502 PT Time Calculation (min) (ACUTE ONLY): 22 min  Charges:  $Gait Training:  8-22 mins                    G Codes:      Salina April, PTA Pager: [phone]   05/22/2015, 3:55 PM

## 2015-05-23 LAB — TYPE AND SCREEN
ABO/RH(D): O POS
ANTIBODY SCREEN: NEGATIVE
Unit division: 0
Unit division: 0

## 2015-05-24 ENCOUNTER — Ambulatory Visit: Payer: Medicare PPO | Admitting: Hematology and Oncology

## 2015-05-24 ENCOUNTER — Other Ambulatory Visit: Payer: Medicare PPO

## 2015-07-21 ENCOUNTER — Emergency Department (HOSPITAL_COMMUNITY): Payer: Medicare Other

## 2015-07-21 ENCOUNTER — Emergency Department (HOSPITAL_COMMUNITY)
Admission: EM | Admit: 2015-07-21 | Discharge: 2015-07-22 | Disposition: A | Discharge status: Home / Self Care | Payer: Medicare Other | Attending: Emergency Medicine | Admitting: Emergency Medicine

## 2015-07-21 ENCOUNTER — Encounter (HOSPITAL_COMMUNITY): Payer: Self-pay | Admitting: Emergency Medicine

## 2015-07-21 ENCOUNTER — Telehealth: Payer: Self-pay | Admitting: Hematology and Oncology

## 2015-07-21 DIAGNOSIS — N390 Urinary tract infection, site not specified: Secondary | ICD-10-CM | POA: Insufficient documentation

## 2015-07-21 DIAGNOSIS — I129 Hypertensive chronic kidney disease with stage 1 through stage 4 chronic kidney disease, or unspecified chronic kidney disease: Secondary | ICD-10-CM | POA: Insufficient documentation

## 2015-07-21 DIAGNOSIS — J449 Chronic obstructive pulmonary disease, unspecified: Secondary | ICD-10-CM | POA: Diagnosis not present

## 2015-07-21 DIAGNOSIS — Z87891 Personal history of nicotine dependence: Secondary | ICD-10-CM | POA: Diagnosis not present

## 2015-07-21 DIAGNOSIS — N183 Chronic kidney disease, stage 3 (moderate): Secondary | ICD-10-CM | POA: Diagnosis not present

## 2015-07-21 DIAGNOSIS — Z7901 Long term (current) use of anticoagulants: Secondary | ICD-10-CM | POA: Diagnosis not present

## 2015-07-21 DIAGNOSIS — M1990 Unspecified osteoarthritis, unspecified site: Secondary | ICD-10-CM | POA: Insufficient documentation

## 2015-07-21 DIAGNOSIS — Z8659 Personal history of other mental and behavioral disorders: Secondary | ICD-10-CM | POA: Diagnosis not present

## 2015-07-21 DIAGNOSIS — Z862 Personal history of diseases of the blood and blood-forming organs and certain disorders involving the immune mechanism: Secondary | ICD-10-CM | POA: Diagnosis not present

## 2015-07-21 DIAGNOSIS — Z7952 Long term (current) use of systemic steroids: Secondary | ICD-10-CM | POA: Insufficient documentation

## 2015-07-21 DIAGNOSIS — Z7982 Long term (current) use of aspirin: Secondary | ICD-10-CM | POA: Insufficient documentation

## 2015-07-21 DIAGNOSIS — K209 Esophagitis, unspecified without bleeding: Secondary | ICD-10-CM

## 2015-07-21 DIAGNOSIS — Z85828 Personal history of other malignant neoplasm of skin: Secondary | ICD-10-CM | POA: Insufficient documentation

## 2015-07-21 DIAGNOSIS — Z880 Allergy status to penicillin: Secondary | ICD-10-CM | POA: Diagnosis not present

## 2015-07-21 DIAGNOSIS — R1010 Upper abdominal pain, unspecified: Secondary | ICD-10-CM | POA: Diagnosis present

## 2015-07-21 LAB — CBC WITH DIFFERENTIAL/PLATELET
BASOS ABS: 0 10*3/uL (ref 0.0–0.1)
Basophils Relative: 0 %
EOS ABS: 0.1 10*3/uL (ref 0.0–0.7)
EOS PCT: 1 %
HCT: 28.6 % — ABNORMAL LOW (ref 36.0–46.0)
Hemoglobin: 8.9 g/dL — ABNORMAL LOW (ref 12.0–15.0)
LYMPHS ABS: 0.3 10*3/uL — AB (ref 0.7–4.0)
Lymphocytes Relative: 4 %
MCH: 30.9 pg (ref 26.0–34.0)
MCHC: 31.1 g/dL (ref 30.0–36.0)
MCV: 99.3 fL (ref 78.0–100.0)
Monocytes Absolute: 0.5 10*3/uL (ref 0.1–1.0)
Monocytes Relative: 9 %
Neutro Abs: 5.2 10*3/uL (ref 1.7–7.7)
Neutrophils Relative %: 86 %
PLATELETS: 188 10*3/uL (ref 150–400)
RBC: 2.88 MIL/uL — AB (ref 3.87–5.11)
RDW: 14.5 % (ref 11.5–15.5)
WBC: 6 10*3/uL (ref 4.0–10.5)

## 2015-07-21 LAB — URINALYSIS, ROUTINE W REFLEX MICROSCOPIC
Bilirubin Urine: NEGATIVE
Glucose, UA: NEGATIVE mg/dL
Ketones, ur: NEGATIVE mg/dL
NITRITE: NEGATIVE
Protein, ur: NEGATIVE mg/dL
SPECIFIC GRAVITY, URINE: 1.014 (ref 1.005–1.030)
pH: 6 (ref 5.0–8.0)

## 2015-07-21 LAB — COMPREHENSIVE METABOLIC PANEL
ALT: 10 U/L — AB (ref 14–54)
ANION GAP: 7 (ref 5–15)
AST: 14 U/L — ABNORMAL LOW (ref 15–41)
Albumin: 2.9 g/dL — ABNORMAL LOW (ref 3.5–5.0)
Alkaline Phosphatase: 72 U/L (ref 38–126)
BUN: 10 mg/dL (ref 6–20)
CHLORIDE: 106 mmol/L (ref 101–111)
CO2: 25 mmol/L (ref 22–32)
CREATININE: 0.92 mg/dL (ref 0.44–1.00)
Calcium: 9.3 mg/dL (ref 8.9–10.3)
GFR calc non Af Amer: 59 mL/min — ABNORMAL LOW (ref >60)
Glucose, Bld: 138 mg/dL — ABNORMAL HIGH (ref 65–99)
Potassium: 4.5 mmol/L (ref 3.5–5.1)
SODIUM: 138 mmol/L (ref 135–145)
Total Bilirubin: 1.2 mg/dL (ref 0.3–1.2)
Total Protein: 5.8 g/dL — ABNORMAL LOW (ref 6.5–8.1)

## 2015-07-21 LAB — URINE MICROSCOPIC-ADD ON

## 2015-07-21 LAB — I-STAT CG4 LACTIC ACID, ED
LACTIC ACID, VENOUS: 2.12 mmol/L — AB (ref 0.5–2.0)
LACTIC ACID, VENOUS: 0.58 mmol/L (ref 0.5–2.0)

## 2015-07-21 LAB — LIPASE, BLOOD: Lipase: 14 U/L (ref 11–51)

## 2015-07-21 MED ORDER — HYDROMORPHONE HCL 1 MG/ML IJ SOLN
0.5000 mg | Freq: Once | INTRAMUSCULAR | Status: AC
  Administered 2015-07-21: 0.5 mg via INTRAVENOUS
  Filled 2015-07-21: qty 1

## 2015-07-21 MED ORDER — ONDANSETRON HCL 4 MG/2ML IJ SOLN
4.0000 mg | Freq: Once | INTRAMUSCULAR | Status: AC
  Administered 2015-07-21: 4 mg via INTRAVENOUS
  Filled 2015-07-21: qty 2

## 2015-07-21 MED ORDER — OMEPRAZOLE 20 MG PO CPDR: 20.0000 mg | DELAYED_RELEASE_CAPSULE | Freq: Every day | ORAL | Status: DC

## 2015-07-21 MED ORDER — FOSFOMYCIN TROMETHAMINE 3 G PO PACK
3.0000 g | PACK | Freq: Once | ORAL | Status: AC
  Administered 2015-07-21: 3 g via ORAL
  Filled 2015-07-21: qty 3

## 2015-07-21 MED ORDER — SUCRALFATE 1 GM/10ML PO SUSP: 1.0000 g | Freq: Three times a day (TID) | ORAL | Status: DC

## 2015-07-21 MED ORDER — ONDANSETRON HCL 4 MG PO TABS: 4.0000 mg | ORAL_TABLET | Freq: Four times a day (QID) | ORAL | Status: DC

## 2015-07-21 MED ORDER — SODIUM CHLORIDE 0.9 % IV BOLUS (SEPSIS)
500.0000 mL | Freq: Once | INTRAVENOUS | Status: AC
  Administered 2015-07-21: 500 mL via INTRAVENOUS

## 2015-07-21 MED ORDER — IOPAMIDOL (ISOVUE-300) INJECTION 61%
100.0000 mL | Freq: Once | INTRAVENOUS | Status: AC | PRN
  Administered 2015-07-21: 100 mL via INTRAVENOUS

## 2015-07-21 NOTE — ED Notes (Signed)
Patient comes from home states she started having Abd pain since 10am. States she had hernia repair and feel like same pain. Patient states she vomited x1 at 10 am non since. States she took an oxycodone and it relieved the pain but does not want to keep taking the pain medication. Patient states pain the all over the abd.

## 2015-07-21 NOTE — ED Provider Notes (Signed)
CSN: [id number]     Arrival date & time 07/21/15  1751 History   First MD Initiated Contact with Patient 07/21/15 1756     Chief Complaint  Patient presents with  . Abdominal Pain     (Consider location/radiation/quality/duration/timing/severity/associated sxs/prior Treatment) HPI Comments: 75 year old female with history of hypertension, chronic kidney disease, COPD, previous abdominal hernia repair para presents for upper abdominal pain. The patient states that she has had off-and-on pain similar to this for years ever since having a hernia is repaired. She reports today at 10 AM she developed pain and then it slowly become more severe over the course of the day. She reports nausea. Denies vomiting. No diarrhea or constipation. No fevers or chills. She reports she seen multiple specialists for this issue in the past and they could never find a cause.   Past Medical History  Diagnosis Date  . Leukocytopenia   . HTN (hypertension)   . IBS (irritable bowel syndrome)   . Anxiety   . Depression   . Chronic kidney disease (CKD), stage III (moderate)   . Hypercalcemia   . Fatigue   . Lymphopenia 08/23/2011  . COPD (chronic obstructive pulmonary disease) ([place])   . Renal insufficiency   . UTI (urinary tract infection) 12/26/2011  . Syncope and collapse 12/24/2011    "loss of consciousness for 3-4 min" (12/26/2011)  . Hypercholesteremia     "above borderline; I don't take RX for it" (12/26/2011)  . H/O hiatal hernia   . GERD (gastroesophageal reflux disease)   . Osteoporosis   . Cancer ([place]) 04-06-12    skin cancer nasal bridge-no problems now  . Iron deficiency anemia     [facility]  . History of blood transfusion     "2 units in 12/2012; suppose to get 1 unit today" (02/12/2013)  . Arthritis     "knees and back" (02/12/2013)  . Polymyalgia ([place])   . Gout 04/05/2013  . Hyperbilirubinemia 05/03/2013  . PMR (polymyalgia rheumatica) ([facility])     pmr  . IBS  (irritable bowel syndrome)     ibs  . Arthritis   . Anxiety   . Depression   . Lumbar spinal stenosis   . Osteoarthritis   . Diverticulitis   . Asthma     no problems recently  . Blood dyscrasia     sperocytosis   Past Surgical History  Procedure Laterality Date  . Esophagogastroduodenoscopy  12/26/2011    Procedure: ESOPHAGOGASTRODUODENOSCOPY (EGD);  Surgeon: Inda Castle, MD;  Location: [facility];  Service: Endoscopy;  Laterality: N/A;  . Breast surgery  04-06-12    rt. lumpectomy_benign  . Laparoscopic nissen fundoplication N/A [id number]    Procedure: Laparoscopic Reduction and Repair of Paraesophagel Hiatal Hernia with Nissen;  Surgeon: Adin Hector, MD;  Location: [facility];  Service: General;  Laterality: N/A;  Laparoscopic Reduction and Repair of Paraesophagel Hiatal Hernia with Nissen  . Hernia repair  1970's    "stomach"   . Vaginal hysterectomy  ~ 1980    right ovary  . Tubal ligation  1970's  . Arthroscopic knee surgery-left    . Nissen fundoplication      [id number]  . Colonoscopy, esophagogastroduodenoscopy (egd) and esophageal dilation      06/2013  . Appendectomy  80  . Eye surgery      cataracts  . Total knee arthroplasty Right 05/19/2015  . Total knee arthroplasty Right 05/19/2015    Procedure: RIGHT TOTAL KNEE  ARTHROPLASTY;  Surgeon: Netta Cedars, MD;  Location: [facility];  Service: Orthopedics;  Laterality: Right;   Family History  Problem Relation Age of Onset  . Diabetes Father   . Heart disease Father   . Cancer Maternal Aunt     Breast cancer   Social History  Substance Use Topics  . Smoking status: Former Smoker -- 1.00 packs/day for 40 years    Types: Cigarettes    Quit date: 02/19/2000  . Smokeless tobacco: Never Used  . Alcohol Use: No   OB History    No data available     Review of Systems  Constitutional: Positive for appetite change. Negative for fever, chills and fatigue.  HENT: Negative for congestion, rhinorrhea and sinus pressure.    Eyes: Negative for visual disturbance.  Respiratory: Negative for cough, chest tightness and shortness of breath.   Cardiovascular: Negative for chest pain and palpitations.  Gastrointestinal: Positive for nausea and abdominal pain. Negative for vomiting, diarrhea, constipation and blood in stool.  Genitourinary: Negative for dysuria, urgency and frequency.  Musculoskeletal: Negative for myalgias and back pain.  Skin: Negative for rash.  Neurological: Negative for dizziness, weakness and headaches.  Hematological: Bruises/bleeds easily.      Allergies  Penicillins; Brovana; and Paxil  Home Medications   Prior to Admission medications   Medication Sig Start Date End Date Taking? Authorizing Provider  albuterol (PROVENTIL HFA;VENTOLIN HFA) 108 (90 Base) MCG/ACT inhaler Inhale 2 puffs into the lungs every 6 (six) hours as needed for wheezing or shortness of breath.   Yes Historical Provider, MD  Alum Hydroxide-Mag Carbonate 160-105 MG CHEW Chew 1 tablet by mouth daily after breakfast.   Yes Historical Provider, MD  aspirin EC 81 MG tablet Take 81 mg by mouth daily after breakfast.    Yes Historical Provider, MD  Calcium Carb-Cholecalciferol (CALCIUM 600 + D PO) Take 2 tablets by mouth daily after breakfast.   Yes Historical Provider, MD  CRANBERRY PO Take 4,200 mg by mouth daily after breakfast.   Yes Historical Provider, MD  cyanocobalamin 500 MCG tablet Take 500 mcg by mouth daily after breakfast. Vitamin B12   Yes Historical Provider, MD  folic acid (FOLVITE) [id number] MCG tablet Take 400 mcg by mouth daily after breakfast.   Yes Historical Provider, MD  HYDROcodone-acetaminophen (NORCO/VICODIN) 5-325 MG tablet Take 1 tablet by mouth 2 (two) times daily. 07/11/15  Yes Historical Provider, MD  lisinopril (PRINIVIL) 10 MG tablet Take 1 tablet (10 mg total) by mouth daily. Patient taking differently: Take 10 mg by mouth daily after breakfast.  02/15/13  Yes Oswald Hillock, MD  methocarbamol  (ROBAXIN) 500 MG tablet Take 1 tablet (500 mg total) by mouth 3 (three) times daily as needed. Patient taking differently: Take 500 mg by mouth daily after breakfast.  05/19/15  Yes Netta Cedars, MD  Multiple Vitamins-Minerals (OCUVITE PO) Take 1 tablet by mouth daily after breakfast.   Yes Historical Provider, MD  predniSONE (DELTASONE) 5 MG tablet Take 5 mg by mouth daily after breakfast.   Yes Historical Provider, MD  omeprazole (PRILOSEC) 20 MG capsule Take 1 capsule (20 mg total) by mouth daily. 07/21/15   Harvel Quale, MD  ondansetron (ZOFRAN) 4 MG tablet Take 1 tablet (4 mg total) by mouth every 6 (six) hours. 07/21/15   Harvel Quale, MD  oxyCODONE-acetaminophen (ROXICET) 5-325 MG tablet Take 1-2 tablets by mouth every 4 (four) hours as needed for severe pain. Patient not taking: Reported on 07/21/2015  05/19/15   Netta Cedars, MD  predniSONE (DELTASONE) 10 MG tablet Take 2 tablets (20 mg total) by mouth daily with breakfast. Patient not taking: Reported on 07/21/2015 08/01/14   Heath Lark, MD  sucralfate (CARAFATE) 1 GM/10ML suspension Take 10 mLs (1 g total) by mouth 4 (four) times daily -  with meals and at bedtime. 07/21/15   Harvel Quale, MD  warfarin (COUMADIN) 5 MG tablet Take 1 tablet (5 mg total) by mouth daily. Take as directed per the pharmacist for INR target from 2.5-3.0 for 30 days post op Patient not taking: Reported on 07/21/2015 05/19/15   Netta Cedars, MD   BP 116/58 mmHg  Pulse 66  Temp(Src) 98.8 F (37.1 C) (Oral)  Resp 18  SpO2 98% Physical Exam  Constitutional: She is oriented to person, place, and time. She appears well-developed and well-nourished. No distress.  HENT:  Head: Normocephalic and atraumatic.  Right Ear: External ear normal.  Left Ear: External ear normal.  Nose: Nose normal.  Mouth/Throat: Oropharynx is clear and moist. No oropharyngeal exudate.  Eyes: EOM are normal. Pupils are equal, round, and reactive to light.  Neck: Normal range of motion.  Neck supple.  Cardiovascular: Normal rate, regular rhythm and intact distal pulses.   Pulmonary/Chest: Effort normal. No respiratory distress. She has no wheezes. She has no rales.  Abdominal: Soft. She exhibits no distension. There is tenderness in the epigastric area. There is no rigidity and negative Murphy's sign. No hernia.  Musculoskeletal: Normal range of motion. She exhibits no edema or tenderness.  Neurological: She is alert and oriented to person, place, and time.  Skin: Skin is warm and dry. No rash noted. She is not diaphoretic.  Vitals reviewed.   ED Course  Procedures (including critical care time) Labs Review Labs Reviewed  CBC WITH DIFFERENTIAL/PLATELET - Abnormal; Notable for the following:    RBC 2.88 (*)    Hemoglobin 8.9 (*)    HCT 28.6 (*)    Lymphs Abs 0.3 (*)    All other components within normal limits  COMPREHENSIVE METABOLIC PANEL - Abnormal; Notable for the following:    Glucose, Bld 138 (*)    Total Protein 5.8 (*)    Albumin 2.9 (*)    AST 14 (*)    ALT 10 (*)    GFR calc non Af Amer 59 (*)    All other components within normal limits  URINALYSIS, ROUTINE W REFLEX MICROSCOPIC (NOT AT [facility]) - Abnormal; Notable for the following:    APPearance TURBID (*)    Hgb urine dipstick TRACE (*)    Leukocytes, UA LARGE (*)    All other components within normal limits  URINE MICROSCOPIC-ADD ON - Abnormal; Notable for the following:    Squamous Epithelial / LPF 6-30 (*)    Bacteria, UA MANY (*)    All other components within normal limits  I-STAT CG4 LACTIC ACID, ED - Abnormal; Notable for the following:    Lactic Acid, Venous 2.12 (*)    All other components within normal limits  LIPASE, BLOOD  I-STAT TROPOININ, ED  I-STAT CG4 LACTIC ACID, ED    Imaging Review Ct Abdomen Pelvis W Contrast  07/21/2015  CLINICAL DATA:  Acute onset of nausea, vomiting, diarrhea and generalized abdominal pain. Initial encounter. EXAM: CT ABDOMEN AND PELVIS WITH CONTRAST  TECHNIQUE: Multidetector CT imaging of the abdomen and pelvis was performed using the standard protocol following bolus administration of intravenous contrast. CONTRAST:  122mL ISOVUE-300 IOPAMIDOL (ISOVUE-300)  INJECTION 61% COMPARISON:  CT of the abdomen and pelvis performed 02/12/2013, and abdominal ultrasound performed 01/06/2013 FINDINGS: Minimal bibasilar atelectasis is noted. Postoperative change is noted about the gastroesophageal junction. Mild diffuse wall thickening along the distal esophagus may reflect esophagitis or chronic inflammation. Diffuse coronary artery calcifications are seen. A small calcified granuloma is noted within the right hepatic lobe. The spleen is unremarkable in appearance. The gallbladder is within normal limits. The pancreas and adrenal glands are unremarkable. The kidneys are unremarkable in appearance. There is no evidence of hydronephrosis. No renal or ureteral stones are seen. Nonspecific perinephric stranding is noted bilaterally. No free fluid is identified. The small bowel is unremarkable in appearance. The stomach is within normal limits. No acute vascular abnormalities are seen. Diffuse calcification is seen along the abdominal aorta and its branches. The patient is status post appendectomy. Mild diverticulosis is noted along the sigmoid colon, without evidence of diverticulitis. The bladder is moderately distended and grossly unremarkable. A tiny left-sided bladder diverticulum is noted. The patient is status post hysterectomy. No suspicious adnexal masses are seen. No inguinal lymphadenopathy is seen. No acute osseous abnormalities are identified. Vacuum phenomenon is noted along the lower lumbar spine. IMPRESSION: 1. Mild diffuse wall thickening along the distal esophagus may reflect esophagitis or chronic inflammation. Would correlate with the patient's symptoms. 2. Diffuse calcification along the abdominal aorta and its branches. 3. Mild diverticulosis along the  sigmoid colon, without evidence of diverticulitis. 4. Tiny left-sided bladder diverticulum incidentally noted. Electronically Signed   By: Garald Balding [name]   On: 07/21/2015 22:27   I have personally reviewed and evaluated these images and lab results as part of my medical decision-making.   EKG Interpretation None      MDM  Patient was seen and evaluated in stable condition. Patient appeared uncomfortable on initial evaluation. Laboratory studies revealed a mildly elevated lactic acid which cleared with IV fluids. Urine also consistent with infection. Otherwise laboratory results unremarkable for patient. CT with findings of esophagitis which is consistent with where the patient is having pain in the symptoms she is been experiencing. Pain was controlled with antiemetics and pain medicine. Urine cultures from previous infections reviewed. It appeared that fosfomycin would likely cover her UTI in light of her being relatively asymptomatic and without signs of sepsis. Patient was given a dose of fosfomycin here. Results of CT and other laboratory studies discussed at length with patient and her family. Patient was discharged home with instruction to follow-up with her gastroenterologist and with prescription for Carafate and omeprazole. Strict return precautions given. All questions were answered prior to discharge. Final diagnoses:  Esophagitis  UTI (lower urinary tract infection)    1. Esophagitis 2. UTI    Harvel Quale, MD 07/22/15 0005

## 2015-07-21 NOTE — Telephone Encounter (Signed)
pt called to sched appt.Nicole KitchenMarland KitchenMarland KitchenMarland Kitchenpt ok adn aware of d.t

## 2015-07-21 NOTE — Discharge Instructions (Signed)
You were seen and evaluated today for your abdominal pain. This appears to be secondary to esophagitis. Please take the medications prescribed and follow-up with your gastroenterologist. He were also noted to have a urinary tract infection. This was treated with a one time antibiotic that appears that it should work based spun your previous cultures. Please follow-up with her primary care physician regarding your recurrent UTIs. It was noted on your abdominal CT that you do have with known as a diverticulum of your bladder which may be part of the reason why he continued to have recurrent UTIs.  Esophagitis Esophagitis is inflammation of the esophagus. The esophagus is the tube that carries food and liquids from your mouth to your stomach. Esophagitis can cause soreness or pain in the esophagus. This condition can make it difficult and painful to swallow.  CAUSES Most causes of esophagitis are not serious. Common causes of this condition include:  Gastroesophageal reflux disease (GERD). This is when stomach contents move back up into the esophagus (reflux).  Repeated vomiting.  An allergic-type reaction, especially caused by food allergies (eosinophilic esophagitis).  Injury to the esophagus by swallowing large pills with or without water, or swallowing certain types of medicines.  Swallowing (ingesting) harmful chemicals, such as household cleaning products.  Heavy alcohol use.  An infection of the esophagus.This most often occurs in people who have a weakened immune system.  Radiation or chemotherapy treatment for cancer.  Certain diseases such as sarcoidosis, Crohn disease, and scleroderma. SYMPTOMS Symptoms of this condition include:  Difficult or painful swallowing.  Pain with swallowing acidic liquids, such as citrus juices.  Pain with burping.  Chest pain.  Difficulty breathing.  Nausea.  Vomiting.  Pain in the abdomen.  Weight loss.  Ulcers in the  mouth.  Patches of white material in the mouth (candidiasis).  Fever.  Coughing up blood or vomiting blood.  Stool that is black, tarry, or bright red. DIAGNOSIS Your health care provider will take a medical history and perform a physical exam. You may also have other tests, including:  An endoscopy to examine your stomach and esophagus with a small camera.  A test that measures the acidity level in your esophagus.  A test that measures how much pressure is on your esophagus.  A barium swallow or modified barium swallow to show the shape, size, and functioning of your esophagus.  Allergy tests. TREATMENT Treatment for this condition depends on the cause of your esophagitis. In some cases, steroids or other medicines may be given to help relieve your symptoms or to treat the underlying cause of your condition. You may have to make some lifestyle changes, such as:  Avoiding alcohol.  Quitting smoking.  Changing your diet.  Exercising.  Changing your sleep habits and your sleep environment. HOME CARE INSTRUCTIONS Take these actions to decrease your discomfort and to help avoid complications. Diet  Follow a diet as recommended by your health care provider. This may involve avoiding foods and drinks such as:  Coffee and tea (with or without caffeine).  Drinks that contain alcohol.  Energy drinks and sports drinks.  Carbonated drinks or sodas.  Chocolate and cocoa.  Peppermint and mint flavorings.  Garlic and onions.  Horseradish.  Spicy and acidic foods, including peppers, chili powder, curry powder, vinegar, hot sauces, and barbecue sauce.  Citrus fruit juices and citrus fruits, such as oranges, lemons, and limes.  Tomato-based foods, such as red sauce, chili, salsa, and pizza with red sauce.  Maceo Pro and  fatty foods, such as donuts, french fries, potato chips, and high-fat dressings.  High-fat meats, such as hot dogs and fatty cuts of red and white meats,  such as rib eye steak, sausage, ham, and bacon.  High-fat dairy items, such as whole milk, butter, and cream cheese.  Eat small, frequent meals instead of large meals.  Avoid drinking large amounts of liquid with your meals.  Avoid eating meals during the 2-3 hours before bedtime.  Avoid lying down right after you eat.  Do not exercise right after you eat.  Avoid foods and drinks that seem to make your symptoms worse. General Instructions  Pay attention to any changes in your symptoms.  Take over-the-counter and prescription medicines only as told by your health care provider. Do not take aspirin, ibuprofen, or other NSAIDs unless your health care provider told you to do so.  If you have trouble taking pills, use a pill splitter to decrease the size of the pill. This will decrease the chance of the pill getting stuck or injuring your esophagus on the way down. Also, drink water after you take a pill.  Do not use any tobacco products, including cigarettes, chewing tobacco, and e-cigarettes. If you need help quitting, ask your health care provider.  Wear loose-fitting clothing. Do not wear anything tight around your waist that causes pressure on your abdomen.  Raise (elevate) the head of your bed about 6 inches (15 cm).  Try to reduce your stress, such as with yoga or meditation. If you need help reducing stress, ask your health care provider.  If you are overweight, reduce your weight to an amount that is healthy for you. Ask your health care provider for guidance about a safe weight loss goal.  Keep all follow-up visits as told by your health care provider. This is important. SEEK MEDICAL CARE IF:  You have new symptoms.  You have unexplained weight loss.  You have difficulty swallowing, or it hurts to swallow.  You have wheezing or a persistent cough.  Your symptoms do not improve with treatment.  You have frequent heartburn for more than two weeks. SEEK IMMEDIATE  MEDICAL CARE IF:  You have severe pain in your arms, neck, jaw, teeth, or back.  You feel sweaty, dizzy, or light-headed.  You have chest pain or shortness of breath.  You vomit and your vomit looks like blood or coffee grounds.  Your stool is bloody or black.  You have a fever.  You cannot swallow, drink, or eat.   This information is not intended to replace advice given to you by your health care provider. Make sure you discuss any questions you have with your health care provider.   Document Released: 03/14/2004 Document Revised: 10/26/2014 Document Reviewed: 06/01/2014 Elsevier Interactive Patient Education 2016 [employer]  Urinary Tract Infection Urinary tract infections (UTIs) can develop anywhere along your urinary tract. Your urinary tract is your body's drainage system for removing wastes and extra water. Your urinary tract includes two kidneys, two ureters, a bladder, and a urethra. Your kidneys are a pair of bean-shaped organs. Each kidney is about the size of your fist. They are located below your ribs, one on each side of your spine. CAUSES Infections are caused by microbes, which are microscopic organisms, including fungi, viruses, and bacteria. These organisms are so small that they can only be seen through a microscope. Bacteria are the microbes that most commonly cause UTIs. SYMPTOMS  Symptoms of UTIs may vary by age and  gender of the patient and by the location of the infection. Symptoms in young women typically include a frequent and intense urge to urinate and a painful, burning feeling in the bladder or urethra during urination. Older women and men are more likely to be tired, shaky, and weak and have muscle aches and abdominal pain. A fever may mean the infection is in your kidneys. Other symptoms of a kidney infection include pain in your back or sides below the ribs, nausea, and vomiting. DIAGNOSIS To diagnose a UTI, your caregiver will ask you about your  symptoms. Your caregiver will also ask you to provide a urine sample. The urine sample will be tested for bacteria and white blood cells. White blood cells are made by your body to help fight infection. TREATMENT  Typically, UTIs can be treated with medication. Because most UTIs are caused by a bacterial infection, they usually can be treated with the use of antibiotics. The choice of antibiotic and length of treatment depend on your symptoms and the type of bacteria causing your infection. HOME CARE INSTRUCTIONS  If you were prescribed antibiotics, take them exactly as your caregiver instructs you. Finish the medication even if you feel better after you have only taken some of the medication.  Drink enough water and fluids to keep your urine clear or pale yellow.  Avoid caffeine, tea, and carbonated beverages. They tend to irritate your bladder.  Empty your bladder often. Avoid holding urine for long periods of time.  Empty your bladder before and after sexual intercourse.  After a bowel movement, women should cleanse from front to back. Use each tissue only once. SEEK MEDICAL CARE IF:   You have back pain.  You develop a fever.  Your symptoms do not begin to resolve within 3 days. SEEK IMMEDIATE MEDICAL CARE IF:   You have severe back pain or lower abdominal pain.  You develop chills.  You have nausea or vomiting.  You have continued burning or discomfort with urination. MAKE SURE YOU:   Understand these instructions.  Will watch your condition.  Will get help right away if you are not doing well or get worse.   This information is not intended to replace advice given to you by your health care provider. Make sure you discuss any questions you have with your health care provider.   Document Released: 11/14/2004 Document Revised: 10/26/2014 Document Reviewed: 03/15/2011 Elsevier Interactive Patient Education [employer].

## 2015-08-04 ENCOUNTER — Ambulatory Visit (HOSPITAL_BASED_OUTPATIENT_CLINIC_OR_DEPARTMENT_OTHER): Payer: Medicare Other | Admitting: Hematology and Oncology

## 2015-08-04 ENCOUNTER — Other Ambulatory Visit (HOSPITAL_BASED_OUTPATIENT_CLINIC_OR_DEPARTMENT_OTHER): Payer: Medicare Other

## 2015-08-04 ENCOUNTER — Encounter: Payer: Self-pay | Admitting: Hematology and Oncology

## 2015-08-04 ENCOUNTER — Telehealth: Payer: Self-pay | Admitting: Hematology and Oncology

## 2015-08-04 VITALS — BP 170/74 | HR 86 | Temp 98.4°F | Resp 18 | Ht 62.0 in | Wt 164.0 lb

## 2015-08-04 DIAGNOSIS — D594 Other nonautoimmune hemolytic anemias: Secondary | ICD-10-CM

## 2015-08-04 DIAGNOSIS — R5383 Other fatigue: Secondary | ICD-10-CM

## 2015-08-04 DIAGNOSIS — D589 Hereditary hemolytic anemia, unspecified: Secondary | ICD-10-CM | POA: Diagnosis not present

## 2015-08-04 DIAGNOSIS — R5382 Chronic fatigue, unspecified: Secondary | ICD-10-CM

## 2015-08-04 LAB — COMPREHENSIVE METABOLIC PANEL
ALBUMIN: 3.6 g/dL (ref 3.5–5.0)
ALK PHOS: 79 U/L (ref 40–150)
ALT: 10 U/L (ref 0–55)
AST: 13 U/L (ref 5–34)
Anion Gap: 9 mEq/L (ref 3–11)
BILIRUBIN TOTAL: 0.69 mg/dL (ref 0.20–1.20)
BUN: 17.4 mg/dL (ref 7.0–26.0)
CALCIUM: 9.8 mg/dL (ref 8.4–10.4)
CO2: 23 mEq/L (ref 22–29)
Chloride: 107 mEq/L (ref 98–109)
Creatinine: 0.9 mg/dL (ref 0.6–1.1)
EGFR: 63 mL/min/{1.73_m2} — AB (ref >90)
GLUCOSE: 103 mg/dL (ref 70–140)
POTASSIUM: 4.1 meq/L (ref 3.5–5.1)
SODIUM: 139 meq/L (ref 136–145)
TOTAL PROTEIN: 7.2 g/dL (ref 6.4–8.3)

## 2015-08-04 LAB — CBC WITH DIFFERENTIAL/PLATELET
BASO%: 0.3 % (ref 0.0–2.0)
BASOS ABS: 0 10*3/uL (ref 0.0–0.1)
EOS ABS: 0 10*3/uL (ref 0.0–0.5)
EOS%: 0.1 % (ref 0.0–7.0)
HEMATOCRIT: 32.5 % — AB (ref 34.8–46.6)
HEMOGLOBIN: 10.7 g/dL — AB (ref 11.6–15.9)
LYMPH#: 0.4 10*3/uL — AB (ref 0.9–3.3)
LYMPH%: 3.3 % — ABNORMAL LOW (ref 14.0–49.7)
MCH: 31.9 pg (ref 25.1–34.0)
MCHC: 33 g/dL (ref 31.5–36.0)
MCV: 96.6 fL (ref 79.5–101.0)
MONO#: 0.5 10*3/uL (ref 0.1–0.9)
MONO%: 4.3 % (ref 0.0–14.0)
NEUT#: 10.5 10*3/uL — ABNORMAL HIGH (ref 1.5–6.5)
NEUT%: 92 % — AB (ref 38.4–76.8)
PLATELETS: 282 10*3/uL (ref 145–400)
RBC: 3.36 10*6/uL — ABNORMAL LOW (ref 3.70–5.45)
RDW: 14.6 % — AB (ref 11.2–14.5)
WBC: 11.4 10*3/uL — ABNORMAL HIGH (ref 3.9–10.3)

## 2015-08-04 LAB — TSH: TSH: 0.783 m(IU)/L (ref 0.308–3.960)

## 2015-08-04 MED ORDER — PREDNISONE 10 MG PO TABS: 10.0000 mg | ORAL_TABLET | Freq: Every day | ORAL | Status: DC

## 2015-08-04 NOTE — Telephone Encounter (Signed)
Gave and printed appts ched and avs for pt for DEC  °

## 2015-08-04 NOTE — Assessment & Plan Note (Addendum)
Screening tests for PNH was negative. 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 I recommend she takes high-dose folic acid and vitamin B12 supplements. I discussed with her the pros and cons of splenectomy but the patient declined further surgery at this point. I will continue prednisone at 5 mg daily that also treats her COPD.   her blood count appears to be responding to this. I will see her back in 6 months.

## 2015-08-04 NOTE — Progress Notes (Signed)
[place] OFFICE PROGRESS NOTE  [name], MD SUMMARY OF HEMATOLOGIC HISTORY:  This is a pleasant lady with background history of severe anemia. In July of 2013 she had bone marrow aspirate and biopsy that came back hypercellular but unremarkable. She was recommended to take iron supplements. Between November to December 2014, she had multiple hospitalization requiring blood transfusion. Iron supplement was discontinued and she was being observed. She was also placed on prednisone. Osmotic fragility testing came back positive, suspicious for hemoglobinopathy such as spherocytosis. Since June 2016, she was maintained on prednisone 5 mg daily INTERVAL HISTORY: Nicole Meyer 75 y.o. female returns for further follow-up. She had recent knee surgery and is recovering well She has occasional reflux but not severe. Denies epigastric pain. She had recent urinary tract infection, resolved with antibiotics The patient denies any recent signs or symptoms of bleeding such as spontaneous epistaxis, hematuria or hematochezia.   I have reviewed the past medical history, past surgical history, social history and family history with the patient and they are unchanged from previous note.  ALLERGIES:  is allergic to penicillins; brovana; and paxil.  MEDICATIONS:  Current Outpatient Prescriptions  Medication Sig Dispense Refill  . albuterol (PROVENTIL HFA;VENTOLIN HFA) 108 (90 Base) MCG/ACT inhaler Inhale 2 puffs into the lungs every 6 (six) hours as needed for wheezing or shortness of breath.    . Alum Hydroxide-Mag Carbonate 160-105 MG CHEW Chew 1 tablet by mouth daily after breakfast.    . aspirin EC 81 MG tablet Take 81 mg by mouth daily after breakfast.     . Calcium Carb-Cholecalciferol (CALCIUM 600 + D PO) Take 2 tablets by mouth daily after breakfast.    . CRANBERRY PO Take 4,200 mg by mouth daily after breakfast.    . cyanocobalamin 500 MCG tablet Take 500 mcg by mouth  daily after breakfast. Vitamin B12    . docusate sodium (COLACE) 100 MG capsule Take 100 mg by mouth daily.    . folic acid (FOLVITE) A999333 MCG tablet Take 400 mcg by mouth daily after breakfast.    . HYDROcodone-acetaminophen (NORCO/VICODIN) 5-325 MG tablet Take 1 tablet by mouth 2 (two) times daily.    [name] lisinopril (PRINIVIL) 10 MG tablet Take 1 tablet (10 mg total) by mouth daily. (Patient taking differently: Take 10 mg by mouth daily after breakfast. ) 30 tablet 2  . Misc Natural Products (ESTROVEN ENERGY PO) Take by mouth.    . Multiple Vitamins-Minerals (OCUVITE PO) Take 1 tablet by mouth daily after breakfast.    . omeprazole (PRILOSEC) 20 MG capsule Take 1 capsule (20 mg total) by mouth daily. 30 capsule 0  . predniSONE (DELTASONE) 5 MG tablet Take 5 mg by mouth daily after breakfast.    . sucralfate (CARAFATE) 1 GM/10ML suspension Take 10 mLs (1 g total) by mouth 4 (four) times daily -  with meals and at bedtime. 420 mL 0   No current facility-administered medications for this visit.     REVIEW OF SYSTEMS:   Constitutional: Denies fevers, chills or night sweats Eyes: Denies blurriness of vision Ears, nose, mouth, throat, and face: Denies mucositis or sore throat Respiratory: Denies cough, dyspnea or wheezes Cardiovascular: Denies palpitation, chest discomfort or lower extremity swelling Skin: Denies abnormal skin rashes Lymphatics: Denies new lymphadenopathy or easy bruising Neurological:Denies numbness, tingling or new weaknesses Behavioral/Psych: Mood is stable, no new changes  All other systems were reviewed with the patient and are negative.  PHYSICAL EXAMINATION: ECOG PERFORMANCE  STATUS: 1 - Symptomatic but completely ambulatory  Filed Vitals:   08/04/15 1201  BP: 170/74  Pulse: 86  Temp: 98.4 F (36.9 C)  Resp: 18   Filed Weights   08/04/15 1201  Weight: 164 lb (74.39 kg)    GENERAL:alert, no distress and comfortable SKIN: skin color, texture, turgor are  normal, no rashes or significant lesions. Noted minor bruising EYES: normal, Conjunctiva are pink and non-injected, sclera clear Musculoskeletal:no cyanosis of digits and no clubbing  NEURO: alert & oriented x 3 with fluent speech, no focal motor/sensory deficits  LABORATORY DATA:  I have reviewed the data as listed     Component Value Date/Time   NA 139 08/04/2015 1149   NA 138 07/21/2015 1843   K 4.1 08/04/2015 1149   K 4.5 07/21/2015 1843   CL 106 07/21/2015 1843   CL 107 07/06/2012 1005   CO2 23 08/04/2015 1149   CO2 25 07/21/2015 1843   GLUCOSE 103 08/04/2015 1149   GLUCOSE 138* 07/21/2015 1843   GLUCOSE 97 07/06/2012 1005   BUN 17.4 08/04/2015 1149   BUN 10 07/21/2015 1843   CREATININE 0.9 08/04/2015 1149   CREATININE 0.92 07/21/2015 1843   CALCIUM 9.8 08/04/2015 1149   CALCIUM 9.3 07/21/2015 1843   PROT 7.2 08/04/2015 1149   PROT 5.8* 07/21/2015 1843   ALBUMIN 3.6 08/04/2015 1149   ALBUMIN 2.9* 07/21/2015 1843   AST 13 08/04/2015 1149   AST 14* 07/21/2015 1843   ALT 10 08/04/2015 1149   ALT 10* 07/21/2015 1843   ALKPHOS 79 08/04/2015 1149   ALKPHOS 72 07/21/2015 1843   BILITOT 0.69 08/04/2015 1149   BILITOT 1.2 07/21/2015 1843   GFRNONAA 59* 07/21/2015 1843   GFRAA >60 07/21/2015 1843    No results found for: SPEP, UPEP  Lab Results  Component Value Date   WBC 11.4* 08/04/2015   NEUTROABS 10.5* 08/04/2015   HGB 10.7* 08/04/2015   HCT 32.5* 08/04/2015   MCV 96.6 08/04/2015   PLT 282 08/04/2015      Chemistry      Component Value Date/Time   NA 139 08/04/2015 1149   NA 138 07/21/2015 1843   K 4.1 08/04/2015 1149   K 4.5 07/21/2015 1843   CL 106 07/21/2015 1843   CL 107 07/06/2012 1005   CO2 23 08/04/2015 1149   CO2 25 07/21/2015 1843   BUN 17.4 08/04/2015 1149   BUN 10 07/21/2015 1843   CREATININE 0.9 08/04/2015 1149   CREATININE 0.92 07/21/2015 1843      Component Value Date/Time   CALCIUM 9.8 08/04/2015 1149   CALCIUM 9.3 07/21/2015 1843    ALKPHOS 79 08/04/2015 1149   ALKPHOS 72 07/21/2015 1843   AST 13 08/04/2015 1149   AST 14* 07/21/2015 1843   ALT 10 08/04/2015 1149   ALT 10* 07/21/2015 1843   BILITOT 0.69 08/04/2015 1149   BILITOT 1.2 07/21/2015 1843      ASSESSMENT & PLAN:  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 I recommend she takes high-dose folic acid and vitamin B12 supplements. I discussed with her the pros and cons of splenectomy but the patient declined further surgery at this point. I will continue prednisone at 5 mg daily that also treats her COPD.   her blood count appears to be responding to this. I will see her back in 6 months.   All questions were answered.  The patient knows to call the clinic with any problems, questions or concerns. No barriers to learning was detected.  I spent 10 minutes counseling the patient face to face. The total time spent in the appointment was 15 minutes and more than 50% was on counseling.     Alvy Bimler, Logan Vegh, MD 6/16/20171:40 PM

## 2016-01-30 ENCOUNTER — Telehealth: Payer: Self-pay | Admitting: *Deleted

## 2016-01-30 NOTE — Telephone Encounter (Signed)
I am overbooked. Just reschedule to 2nd week of January

## 2016-01-30 NOTE — Telephone Encounter (Signed)
Pt is having hand surgery tomorrow and does not think she will be able to make her appt to see Dr. Alvy Bimler this Friday on 12/15.  She asks if ok to r/s her appt w/ Dr. Alvy Bimler by a week or two?

## 2016-02-01 ENCOUNTER — Telehealth: Payer: Self-pay | Admitting: Hematology and Oncology

## 2016-02-01 NOTE — Telephone Encounter (Signed)
sw pt to confirm r/s 12/15 appt to 02/26/16 per LOS

## 2016-02-02 ENCOUNTER — Other Ambulatory Visit: Payer: Medicare Other

## 2016-02-02 ENCOUNTER — Ambulatory Visit: Payer: Medicare Other | Admitting: Hematology and Oncology

## 2016-02-26 ENCOUNTER — Ambulatory Visit (HOSPITAL_BASED_OUTPATIENT_CLINIC_OR_DEPARTMENT_OTHER): Payer: Medicare Other | Admitting: Hematology and Oncology

## 2016-02-26 ENCOUNTER — Telehealth: Payer: Self-pay | Admitting: Hematology and Oncology

## 2016-02-26 ENCOUNTER — Other Ambulatory Visit: Payer: Self-pay | Admitting: Hematology and Oncology

## 2016-02-26 ENCOUNTER — Encounter: Payer: Self-pay | Admitting: Hematology and Oncology

## 2016-02-26 ENCOUNTER — Other Ambulatory Visit (HOSPITAL_BASED_OUTPATIENT_CLINIC_OR_DEPARTMENT_OTHER): Payer: Medicare Other

## 2016-02-26 VITALS — BP 189/75 | HR 64 | Temp 98.1°F | Resp 18 | Ht 62.0 in | Wt 162.0 lb

## 2016-02-26 DIAGNOSIS — D589 Hereditary hemolytic anemia, unspecified: Secondary | ICD-10-CM

## 2016-02-26 DIAGNOSIS — D594 Other nonautoimmune hemolytic anemias: Secondary | ICD-10-CM

## 2016-02-26 DIAGNOSIS — I10 Essential (primary) hypertension: Secondary | ICD-10-CM

## 2016-02-26 LAB — CBC & DIFF AND RETIC
BASO%: 0.4 % (ref 0.0–2.0)
Basophils Absolute: 0 10*3/uL (ref 0.0–0.1)
EOS ABS: 0.6 10*3/uL — AB (ref 0.0–0.5)
EOS%: 13.8 % — AB (ref 0.0–7.0)
HCT: 32.6 % — ABNORMAL LOW (ref 34.8–46.6)
HGB: 10.6 g/dL — ABNORMAL LOW (ref 11.6–15.9)
Immature Retic Fract: 10.3 % — ABNORMAL HIGH (ref 1.60–10.00)
LYMPH%: 13.6 % — AB (ref 14.0–49.7)
MCH: 30.1 pg (ref 25.1–34.0)
MCHC: 32.5 g/dL (ref 31.5–36.0)
MCV: 92.6 fL (ref 79.5–101.0)
MONO#: 0.4 10*3/uL (ref 0.1–0.9)
MONO%: 9 % (ref 0.0–14.0)
NEUT%: 63.2 % (ref 38.4–76.8)
NEUTROS ABS: 2.9 10*3/uL (ref 1.5–6.5)
Platelets: 199 10*3/uL (ref 145–400)
RBC: 3.52 10*6/uL — ABNORMAL LOW (ref 3.70–5.45)
RDW: 13.7 % (ref 11.2–14.5)
RETIC %: 2.22 % — AB (ref 0.70–2.10)
Retic Ct Abs: 78.14 10*3/uL (ref 33.70–90.70)
WBC: 4.6 10*3/uL (ref 3.9–10.3)
lymph#: 0.6 10*3/uL — ABNORMAL LOW (ref 0.9–3.3)

## 2016-02-26 LAB — COMPREHENSIVE METABOLIC PANEL
ALBUMIN: 3.7 g/dL (ref 3.5–5.0)
ALK PHOS: 93 U/L (ref 40–150)
ALT: 7 U/L (ref 0–55)
ANION GAP: 8 meq/L (ref 3–11)
AST: 13 U/L (ref 5–34)
BILIRUBIN TOTAL: 0.72 mg/dL (ref 0.20–1.20)
BUN: 11.7 mg/dL (ref 7.0–26.0)
CALCIUM: 10.1 mg/dL (ref 8.4–10.4)
CHLORIDE: 106 meq/L (ref 98–109)
CO2: 27 mEq/L (ref 22–29)
CREATININE: 0.9 mg/dL (ref 0.6–1.1)
EGFR: 59 mL/min/{1.73_m2} — ABNORMAL LOW (ref >90)
Glucose: 93 mg/dl (ref 70–140)
Potassium: 4.3 mEq/L (ref 3.5–5.1)
Sodium: 140 mEq/L (ref 136–145)
Total Protein: 6.9 g/dL (ref 6.4–8.3)

## 2016-02-26 LAB — LACTATE DEHYDROGENASE: LDH: 216 U/L (ref 125–245)

## 2016-02-26 NOTE — Telephone Encounter (Signed)
Gave patient avs report and appointments for July.  °

## 2016-02-26 NOTE — Assessment & Plan Note (Signed)
Screening tests for PNH was negative. 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 I recommend she takes high-dose folic acid and vitamin B12 supplements. I discussed with her the pros and cons of splenectomy but the patient declined further surgery at this point. I will continue prednisone at 5 mg daily that also treats her COPD.   her blood count appears to be responding to this and tolerating that well, keeping her hemoglobin >10 I will see her back in 6 months. I recommend calcium and vitamin D supplement to prevent osteoporosis

## 2016-02-26 NOTE — Assessment & Plan Note (Signed)
she will continue current medical management. She is not symptomatic from high BP and attributed that to White coat hypertension I recommend close follow-up with primary care doctor for medication adjustment.  

## 2016-02-26 NOTE — Progress Notes (Signed)
[place] OFFICE PROGRESS NOTE  [name], MD SUMMARY OF HEMATOLOGIC HISTORY:  This is a pleasant lady with background history of severe anemia. In July of 2013 she had bone marrow aspirate and biopsy that came back hypercellular but unremarkable. She was recommended to take iron supplements. Between November to December 2014, she had multiple hospitalization requiring blood transfusion. Iron supplement was discontinued and she was being observed. She was also placed on prednisone. Osmotic fragility testing came back positive, suspicious for hemoglobinopathy such as spherocytosis. Since June 2016, she was maintained on prednisone 5 mg daily INTERVAL HISTORY: Nicole Meyer 76 y.o. female returns for follow-up Since I last saw her, she had right wrist surgery and total knee replacement surgery Her history list is updated The patient denies any recent signs or symptoms of bleeding such as spontaneous epistaxis, hematuria or hematochezia. She tolerated prednisone well Denies gastritis or insomnia Denies recent infection  I have reviewed the past medical history, past surgical history, social history and family history with the patient and they are unchanged from previous note.  ALLERGIES:  is allergic to penicillins; brovana [arformoterol]; and paxil [paroxetine hcl].  MEDICATIONS:  Current Outpatient Prescriptions  Medication Sig Dispense Refill  . albuterol (PROVENTIL HFA;VENTOLIN HFA) 108 (90 Base) MCG/ACT inhaler Inhale 2 puffs into the lungs every 6 (six) hours as needed for wheezing or shortness of breath.    . Alum Hydroxide-Mag Carbonate 160-105 MG CHEW Chew 1 tablet by mouth daily after breakfast.    . aspirin EC 81 MG tablet Take 81 mg by mouth daily after breakfast.     . Calcium Carb-Cholecalciferol (CALCIUM 600 + D PO) Take 2 tablets by mouth daily after breakfast.    . CRANBERRY PO Take 4,200 mg by mouth daily after breakfast.    . cyanocobalamin 500  MCG tablet Take 500 mcg by mouth daily after breakfast. Vitamin B12    . docusate sodium (COLACE) 100 MG capsule Take 100 mg by mouth daily.    . folic acid (FOLVITE) A999333 MCG tablet Take 400 mcg by mouth daily after breakfast.    . HYDROcodone-acetaminophen (NORCO/VICODIN) 5-325 MG tablet Take 1 tablet by mouth 2 (two) times daily.    [name] lisinopril (PRINIVIL) 10 MG tablet Take 1 tablet (10 mg total) by mouth daily. (Patient taking differently: Take 10 mg by mouth daily after breakfast. ) 30 tablet 2  . Misc Natural Products (ESTROVEN ENERGY PO) Take by mouth.    . Multiple Vitamins-Minerals (OCUVITE PO) Take 1 tablet by mouth daily after breakfast.    . omeprazole (PRILOSEC) 20 MG capsule Take 1 capsule (20 mg total) by mouth daily. 30 capsule 0  . predniSONE (DELTASONE) 5 MG tablet Take 5 mg by mouth daily after breakfast.    . sucralfate (CARAFATE) 1 GM/10ML suspension Take 10 mLs (1 g total) by mouth 4 (four) times daily -  with meals and at bedtime. 420 mL 0   No current facility-administered medications for this visit.      REVIEW OF SYSTEMS:   Constitutional: Denies fevers, chills or night sweats Eyes: Denies blurriness of vision Ears, nose, mouth, throat, and face: Denies mucositis or sore throat Respiratory: Denies cough, dyspnea or wheezes Cardiovascular: Denies palpitation, chest discomfort or lower extremity swelling Gastrointestinal:  Denies nausea, heartburn or change in bowel habits Skin: Denies abnormal skin rashes Lymphatics: Denies new lymphadenopathy or easy bruising Neurological:Denies numbness, tingling or new weaknesses Behavioral/Psych: Mood is stable, no new changes  All  other systems were reviewed with the patient and are negative.  PHYSICAL EXAMINATION: ECOG PERFORMANCE STATUS: 1 - Symptomatic but completely ambulatory  Vitals:   02/26/16 1207  BP: (!) 189/75  Pulse: 64  Resp: 18  Temp: 98.1 F (36.7 C)   Filed Weights   02/26/16 1207  Weight: 162 lb  (73.5 kg)    GENERAL:alert, no distress and comfortable SKIN: skin color, texture, turgor are normal, no rashes or significant lesions EYES: normal, Conjunctiva are pink and non-injected, sclera clear Musculoskeletal:no cyanosis of digits and no clubbing  NEURO: alert & oriented x 3 with fluent speech, no focal motor/sensory deficits  LABORATORY DATA:  I have reviewed the data as listed     Component Value Date/Time   NA 140 02/26/2016 1154   K 4.3 02/26/2016 1154   CL 106 07/21/2015 1843   CL 107 07/06/2012 1005   CO2 27 02/26/2016 1154   GLUCOSE 93 02/26/2016 1154   GLUCOSE 97 07/06/2012 1005   BUN 11.7 02/26/2016 1154   CREATININE 0.9 02/26/2016 1154   CALCIUM 10.1 02/26/2016 1154   PROT 6.9 02/26/2016 1154   ALBUMIN 3.7 02/26/2016 1154   AST 13 02/26/2016 1154   ALT 7 02/26/2016 1154   ALKPHOS 93 02/26/2016 1154   BILITOT 0.72 02/26/2016 1154   GFRNONAA 59 (L) 07/21/2015 1843   GFRAA >60 07/21/2015 1843    No results found for: SPEP, UPEP  Lab Results  Component Value Date   WBC 4.6 02/26/2016   NEUTROABS 2.9 02/26/2016   HGB 10.6 (L) 02/26/2016   HCT 32.6 (L) 02/26/2016   MCV 92.6 02/26/2016   PLT 199 02/26/2016      Chemistry      Component Value Date/Time   NA 140 02/26/2016 1154   K 4.3 02/26/2016 1154   CL 106 07/21/2015 1843   CL 107 07/06/2012 1005   CO2 27 02/26/2016 1154   BUN 11.7 02/26/2016 1154   CREATININE 0.9 02/26/2016 1154      Component Value Date/Time   CALCIUM 10.1 02/26/2016 1154   ALKPHOS 93 02/26/2016 1154   AST 13 02/26/2016 1154   ALT 7 02/26/2016 1154   BILITOT 0.72 02/26/2016 1154     ASSESSMENT & PLAN:  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 I recommend she takes high-dose folic acid and vitamin B12 supplements. I discussed with her the pros and cons of splenectomy but the patient declined  further surgery at this point. I will continue prednisone at 5 mg daily that also treats her COPD.   her blood count appears to be responding to this and tolerating that well, keeping her hemoglobin >10 I will see her back in 6 months. I recommend calcium and vitamin D supplement to prevent osteoporosis  HTN (hypertension) she will continue current medical management. She is not symptomatic from high BP and attributed that to White coat hypertension I recommend close follow-up with primary care doctor for medication adjustment.    Orders Placed This Encounter  Procedures  . CBC with Differential/Platelet    Standing Status:   Future    Standing Expiration Date:   04/01/2017  . Comprehensive metabolic panel    Standing Status:   Future    Standing Expiration Date:   04/01/2017  . Lactate dehydrogenase    Standing Status:   Future    Standing Expiration Date:   04/01/2017    All  questions were answered. The patient knows to call the clinic with any problems, questions or concerns. No barriers to learning was detected.  I spent 10 minutes counseling the patient face to face. The total time spent in the appointment was 15 minutes and more than 50% was on counseling.     Heath Lark, MD 1/8/20189:12 PM

## 2016-07-05 ENCOUNTER — Other Ambulatory Visit: Payer: Self-pay | Admitting: Family Medicine

## 2016-07-05 DIAGNOSIS — Z1231 Encounter for screening mammogram for malignant neoplasm of breast: Secondary | ICD-10-CM

## 2016-07-30 ENCOUNTER — Ambulatory Visit
Admission: RE | Admit: 2016-07-30 | Discharge: 2016-07-30 | Disposition: A | Discharge status: Home / Self Care | Payer: Medicare Other | Source: Ambulatory Visit | Attending: Family Medicine | Admitting: Family Medicine

## 2016-07-30 DIAGNOSIS — Z1231 Encounter for screening mammogram for malignant neoplasm of breast: Secondary | ICD-10-CM

## 2016-08-23 ENCOUNTER — Telehealth: Payer: Self-pay | Admitting: Hematology and Oncology

## 2016-08-23 ENCOUNTER — Other Ambulatory Visit (HOSPITAL_BASED_OUTPATIENT_CLINIC_OR_DEPARTMENT_OTHER): Payer: Medicare Other

## 2016-08-23 ENCOUNTER — Ambulatory Visit (HOSPITAL_BASED_OUTPATIENT_CLINIC_OR_DEPARTMENT_OTHER): Payer: Medicare Other | Admitting: Hematology and Oncology

## 2016-08-23 DIAGNOSIS — D594 Other nonautoimmune hemolytic anemias: Secondary | ICD-10-CM

## 2016-08-23 DIAGNOSIS — N183 Chronic kidney disease, stage 3 unspecified: Secondary | ICD-10-CM

## 2016-08-23 DIAGNOSIS — I10 Essential (primary) hypertension: Secondary | ICD-10-CM | POA: Diagnosis not present

## 2016-08-23 DIAGNOSIS — D589 Hereditary hemolytic anemia, unspecified: Secondary | ICD-10-CM

## 2016-08-23 LAB — CBC WITH DIFFERENTIAL/PLATELET
BASO%: 0.9 % (ref 0.0–2.0)
Basophils Absolute: 0 10*3/uL (ref 0.0–0.1)
EOS%: 8 % — AB (ref 0.0–7.0)
Eosinophils Absolute: 0.4 10*3/uL (ref 0.0–0.5)
HCT: 32.9 % — ABNORMAL LOW (ref 34.8–46.6)
HGB: 11.1 g/dL — ABNORMAL LOW (ref 11.6–15.9)
LYMPH#: 0.5 10*3/uL — AB (ref 0.9–3.3)
LYMPH%: 11.2 % — ABNORMAL LOW (ref 14.0–49.7)
MCH: 32.2 pg (ref 25.1–34.0)
MCHC: 33.7 g/dL (ref 31.5–36.0)
MCV: 95.4 fL (ref 79.5–101.0)
MONO#: 0.3 10*3/uL (ref 0.1–0.9)
MONO%: 7.4 % (ref 0.0–14.0)
NEUT#: 3.3 10*3/uL (ref 1.5–6.5)
NEUT%: 72.5 % (ref 38.4–76.8)
Platelets: 174 10*3/uL (ref 145–400)
RBC: 3.44 10*6/uL — AB (ref 3.70–5.45)
RDW: 13.7 % (ref 11.2–14.5)
WBC: 4.6 10*3/uL (ref 3.9–10.3)

## 2016-08-23 LAB — COMPREHENSIVE METABOLIC PANEL
ALT: 16 U/L (ref 0–55)
AST: 18 U/L (ref 5–34)
Albumin: 3.7 g/dL (ref 3.5–5.0)
Alkaline Phosphatase: 87 U/L (ref 40–150)
Anion Gap: 8 mEq/L (ref 3–11)
BUN: 24.9 mg/dL (ref 7.0–26.0)
CHLORIDE: 109 meq/L (ref 98–109)
CO2: 22 meq/L (ref 22–29)
CREATININE: 1.3 mg/dL — AB (ref 0.6–1.1)
Calcium: 9.7 mg/dL (ref 8.4–10.4)
EGFR: 39 mL/min/{1.73_m2} — ABNORMAL LOW (ref >90)
Glucose: 113 mg/dl (ref 70–140)
POTASSIUM: 4.8 meq/L (ref 3.5–5.1)
SODIUM: 139 meq/L (ref 136–145)
Total Bilirubin: 0.8 mg/dL (ref 0.20–1.20)
Total Protein: 6.9 g/dL (ref 6.4–8.3)

## 2016-08-23 LAB — LACTATE DEHYDROGENASE: LDH: 221 U/L (ref 125–245)

## 2016-08-23 MED ORDER — PREDNISONE 5 MG PO TABS: 5.0000 mg | ORAL_TABLET | Freq: Every day | ORAL | 3 refills | Status: DC

## 2016-08-23 NOTE — Telephone Encounter (Signed)
Scheduled appt per 7/6 los - Gave patient AVS and calender per los.  

## 2016-08-24 ENCOUNTER — Encounter: Payer: Self-pay | Admitting: Hematology and Oncology

## 2016-08-24 NOTE — Assessment & Plan Note (Signed)
she will continue current medical management. She is not symptomatic from high BP and attributed that to White coat hypertension I recommend close follow-up with primary care doctor for medication adjustment.

## 2016-08-24 NOTE — Progress Notes (Signed)
[place] OFFICE PROGRESS NOTE  Nicole Arabian, MD SUMMARY OF HEMATOLOGIC HISTORY:  This is a pleasant lady with background history of severe anemia. In July of 2013 she had bone marrow aspirate and biopsy that came back hypercellular but unremarkable. She was recommended to take iron supplements. Between November to December 2014, she had multiple hospitalization requiring blood transfusion. Iron supplement was discontinued and she was being observed. She was also placed on prednisone. Osmotic fragility testing came back positive, suspicious for hemoglobinopathy such as spherocytosis. Since June 2016, she was maintained on prednisone 5 mg daily INTERVAL HISTORY: Nicole Meyer 76 y.o. female returns for further follow-up. She complain of significant arthritis pain and left knee pain. The patient  decline knee replacement surgery due to prior unpleasant side effects with postop stay at the skilled nursing facility She was recently prescribed NSAID She continues to take prednisone therapy She bruises easily The patient denies any recent signs or symptoms of bleeding such as spontaneous epistaxis, hematuria or hematochezia.   I have reviewed the past medical history, past surgical history, social history and family history with the patient and they are unchanged from previous note.  ALLERGIES:  is allergic to penicillins; brovana [arformoterol]; and paxil [paroxetine hcl].  MEDICATIONS:  Current Outpatient Prescriptions  Medication Sig Dispense Refill  . albuterol (PROVENTIL HFA;VENTOLIN HFA) 108 (90 Base) MCG/ACT inhaler Inhale 2 puffs into the lungs every 6 (six) hours as needed for wheezing or shortness of breath.    . Alum Hydroxide-Mag Carbonate 160-105 MG CHEW Chew 1 tablet by mouth daily after breakfast.    . aspirin EC 81 MG tablet Take 81 mg by mouth daily after breakfast.     . Calcium Carb-Cholecalciferol (CALCIUM 600 + D PO) Take 2 tablets by mouth daily  after breakfast.    . celecoxib (CELEBREX) 100 MG capsule Take 100 mg by mouth 2 (two) times daily.    [name] CRANBERRY PO Take 4,200 mg by mouth daily after breakfast.    . cyanocobalamin 500 MCG tablet Take 500 mcg by mouth daily after breakfast. Vitamin B12    . docusate sodium (COLACE) 100 MG capsule Take 100 mg by mouth daily.    . folic acid (FOLVITE) 941 MCG tablet Take 400 mcg by mouth daily after breakfast.    . lisinopril (PRINIVIL) 10 MG tablet Take 1 tablet (10 mg total) by mouth daily. (Patient taking differently: Take 10 mg by mouth daily after breakfast. ) 30 tablet 2  . Misc Natural Products (ESTROVEN ENERGY PO) Take by mouth.    . Multiple Vitamins-Minerals (OCUVITE PO) Take 1 tablet by mouth daily after breakfast.    . omeprazole (PRILOSEC) 20 MG capsule Take 1 capsule (20 mg total) by mouth daily. 30 capsule 0  . predniSONE (DELTASONE) 5 MG tablet Take 1 tablet (5 mg total) by mouth daily after breakfast. 90 tablet 3  . HYDROcodone-acetaminophen (NORCO/VICODIN) 5-325 MG tablet Take 1 tablet by mouth 2 (two) times daily.     No current facility-administered medications for this visit.      REVIEW OF SYSTEMS:   Constitutional: Denies fevers, chills or night sweats Eyes: Denies blurriness of vision Ears, nose, mouth, throat, and face: Denies mucositis or sore throat Respiratory: Denies cough, dyspnea or wheezes Cardiovascular: Denies palpitation, chest discomfort or lower extremity swelling Gastrointestinal:  Denies nausea, heartburn or change in bowel habits Skin: Denies abnormal skin rashes Lymphatics: Denies new lymphadenopathy  Neurological:Denies numbness, tingling or new weaknesses Behavioral/Psych: Mood  is stable, no new changes  All other systems were reviewed with the patient and are negative.  PHYSICAL EXAMINATION: ECOG PERFORMANCE STATUS: 1 - Symptomatic but completely ambulatory  Vitals:   08/23/16 1108  BP: (!) 162/54  Pulse: 67  Resp: 18  Temp: 98.5 F  (36.9 C)   Filed Weights   08/23/16 1108  Weight: 166 lb 8 oz (75.5 kg)    GENERAL:alert, no distress and comfortable SKIN: skin color, texture, turgor are normal, no rashes or significant lesions.  Noted skin bruises EYES: normal, Conjunctiva are pink and non-injected, sclera clear OROPHARYNX:no exudate, no erythema and lips, buccal mucosa, and tongue normal  NECK: supple, thyroid normal size, non-tender, without nodularity LYMPH:  no palpable lymphadenopathy in the cervical, axillary or inguinal LUNGS: clear to auscultation and percussion with normal breathing effort HEART: regular rate & rhythm and no murmurs and no lower extremity edema ABDOMEN:abdomen soft, non-tender and normal bowel sounds Musculoskeletal:no cyanosis of digits and no clubbing  NEURO: alert & oriented x 3 with fluent speech, no focal motor/sensory deficits  LABORATORY DATA:  I have reviewed the data as listed     Component Value Date/Time   NA 139 08/23/2016 1022   K 4.8 08/23/2016 1022   CL 106 07/21/2015 1843   CL 107 07/06/2012 1005   CO2 22 08/23/2016 1022   GLUCOSE 113 08/23/2016 1022   GLUCOSE 97 07/06/2012 1005   BUN 24.9 08/23/2016 1022   CREATININE 1.3 (H) 08/23/2016 1022   CALCIUM 9.7 08/23/2016 1022   PROT 6.9 08/23/2016 1022   ALBUMIN 3.7 08/23/2016 1022   AST 18 08/23/2016 1022   ALT 16 08/23/2016 1022   ALKPHOS 87 08/23/2016 1022   BILITOT 0.80 08/23/2016 1022   GFRNONAA 59 (L) 07/21/2015 1843   GFRAA >60 07/21/2015 1843    No results found for: SPEP, UPEP  Lab Results  Component Value Date   WBC 4.6 08/23/2016   NEUTROABS 3.3 08/23/2016   HGB 11.1 (L) 08/23/2016   HCT 32.9 (L) 08/23/2016   MCV 95.4 08/23/2016   PLT 174 08/23/2016      Chemistry      Component Value Date/Time   NA 139 08/23/2016 1022   K 4.8 08/23/2016 1022   CL 106 07/21/2015 1843   CL 107 07/06/2012 1005   CO2 22 08/23/2016 1022   BUN 24.9 08/23/2016 1022   CREATININE 1.3 (H) 08/23/2016 1022       Component Value Date/Time   CALCIUM 9.7 08/23/2016 1022   ALKPHOS 87 08/23/2016 1022   AST 18 08/23/2016 1022   ALT 16 08/23/2016 1022   BILITOT 0.80 08/23/2016 1022      ASSESSMENT & PLAN:  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 I recommend she takes high-dose folic acid and vitamin B12 supplements. I discussed with her the pros and cons of splenectomy but the patient declined further surgery at this point. I will continue prednisone at 5 mg daily that also treats her COPD.   her blood count appears to be responding to this and tolerating that well, keeping her hemoglobin >10 I will see her back in 6 months. I recommend calcium and vitamin D supplement to prevent osteoporosis I warned her about risk of GI bleed and ulcers with concurrent use of NSAID.  HTN (hypertension) she will continue current medical management. She is not symptomatic from high BP and attributed that to  White coat hypertension I recommend close follow-up with primary care doctor for medication adjustment.   Chronic kidney disease (CKD), stage III (moderate) The patient has mild acute on chronic renal failure, likely exacerbated by recent dehydration We also discussed risk factor modification including tight control of blood pressure Recommend close follow-up with primary care doctor   No orders of the defined types were placed in this encounter.   All questions were answered. The patient knows to call the clinic with any problems, questions or concerns. No barriers to learning was detected.  I spent 15 minutes counseling the patient face to face. The total time spent in the appointment was 20 minutes and more than 50% was on counseling.     Heath Lark, MD 7/7/20188:00 AM

## 2016-08-24 NOTE — Assessment & Plan Note (Signed)
The patient has mild acute on chronic renal failure, likely exacerbated by recent dehydration We also discussed risk factor modification including tight control of blood pressure Recommend close follow-up with primary care doctor

## 2016-08-24 NOTE — Assessment & Plan Note (Signed)
Screening tests for PNH was negative. 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 I recommend she takes high-dose folic acid and vitamin B12 supplements. I discussed with her the pros and cons of splenectomy but the patient declined further surgery at this point. I will continue prednisone at 5 mg daily that also treats her COPD.   her blood count appears to be responding to this and tolerating that well, keeping her hemoglobin >10 I will see her back in 6 months. I recommend calcium and vitamin D supplement to prevent osteoporosis I warned her about risk of GI bleed and ulcers with concurrent use of NSAID.

## 2016-12-09 ENCOUNTER — Other Ambulatory Visit: Payer: Self-pay | Admitting: Family Medicine

## 2017-02-20 ENCOUNTER — Other Ambulatory Visit: Payer: Self-pay

## 2017-02-20 DIAGNOSIS — D594 Other nonautoimmune hemolytic anemias: Secondary | ICD-10-CM

## 2017-02-21 ENCOUNTER — Ambulatory Visit: Payer: Medicare Other | Admitting: Hematology and Oncology

## 2017-02-21 ENCOUNTER — Other Ambulatory Visit: Payer: Medicare Other

## 2017-05-13 ENCOUNTER — Telehealth: Payer: Self-pay | Admitting: Hematology and Oncology

## 2017-05-13 NOTE — Telephone Encounter (Signed)
Returned phone call after patient left voicemail - rescheduled patients missed appts.

## 2017-05-20 ENCOUNTER — Encounter: Payer: Self-pay | Admitting: Hematology and Oncology

## 2017-05-20 ENCOUNTER — Inpatient Hospital Stay: Payer: Medicare Other | Attending: Hematology and Oncology | Admitting: Hematology and Oncology

## 2017-05-20 ENCOUNTER — Inpatient Hospital Stay: Payer: Medicare Other

## 2017-05-20 DIAGNOSIS — R5382 Chronic fatigue, unspecified: Secondary | ICD-10-CM | POA: Diagnosis not present

## 2017-05-20 DIAGNOSIS — D594 Other nonautoimmune hemolytic anemias: Secondary | ICD-10-CM

## 2017-05-20 DIAGNOSIS — J449 Chronic obstructive pulmonary disease, unspecified: Secondary | ICD-10-CM | POA: Diagnosis not present

## 2017-05-20 DIAGNOSIS — I10 Essential (primary) hypertension: Secondary | ICD-10-CM

## 2017-05-20 LAB — CBC WITH DIFFERENTIAL/PLATELET
Basophils Absolute: 0 10*3/uL (ref 0.0–0.1)
Basophils Relative: 0 %
EOS ABS: 0.3 10*3/uL (ref 0.0–0.5)
EOS PCT: 7 %
HCT: 34.6 % — ABNORMAL LOW (ref 34.8–46.6)
HEMOGLOBIN: 11.5 g/dL — AB (ref 11.6–15.9)
LYMPHS ABS: 0.4 10*3/uL — AB (ref 0.9–3.3)
LYMPHS PCT: 8 %
MCH: 31.3 pg (ref 25.1–34.0)
MCHC: 33.2 g/dL (ref 31.5–36.0)
MCV: 94.3 fL (ref 79.5–101.0)
MONOS PCT: 7 %
Monocytes Absolute: 0.3 10*3/uL (ref 0.1–0.9)
NEUTROS PCT: 78 %
Neutro Abs: 3.6 10*3/uL (ref 1.5–6.5)
Platelets: 222 10*3/uL (ref 145–400)
RBC: 3.67 MIL/uL — AB (ref 3.70–5.45)
RDW: 14.1 % (ref 11.2–14.5)
WBC: 4.6 10*3/uL (ref 3.9–10.3)

## 2017-05-20 LAB — COMPREHENSIVE METABOLIC PANEL
ALT: 18 U/L (ref 0–55)
AST: 20 U/L (ref 5–34)
Albumin: 3.9 g/dL (ref 3.5–5.0)
Alkaline Phosphatase: 86 U/L (ref 40–150)
Anion gap: 6 (ref 3–11)
BUN: 22 mg/dL (ref 7–26)
CHLORIDE: 106 mmol/L (ref 98–109)
CO2: 27 mmol/L (ref 22–29)
CREATININE: 1.18 mg/dL — AB (ref 0.60–1.10)
Calcium: 10.1 mg/dL (ref 8.4–10.4)
GFR calc Af Amer: 50 mL/min — ABNORMAL LOW (ref >60)
GFR, EST NON AFRICAN AMERICAN: 43 mL/min — AB (ref >60)
Glucose, Bld: 101 mg/dL (ref 70–140)
POTASSIUM: 4.6 mmol/L (ref 3.5–5.1)
SODIUM: 139 mmol/L (ref 136–145)
Total Bilirubin: 0.7 mg/dL (ref 0.2–1.2)
Total Protein: 7.2 g/dL (ref 6.4–8.3)

## 2017-05-20 LAB — LACTATE DEHYDROGENASE: LDH: 263 U/L — AB (ref 125–245)

## 2017-05-20 NOTE — Assessment & Plan Note (Signed)
She has been complaining of chronic fatigue for over 2 years With her stable hemoglobin, it is unlikely that the anemia is a cause of this I would defer to her primary care doctor for further workup

## 2017-05-20 NOTE — Assessment & Plan Note (Signed)
She has poorly controlled hypertension We discussed blood pressure monitoring She will continue to follow-up closely with her primary care doctor for medication adjustment

## 2017-05-20 NOTE — Progress Notes (Signed)
[name] OFFICE PROGRESS NOTE  [name], MD  ASSESSMENT & PLAN:  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 I recommend Nicole Meyer takes high-dose folic acid and vitamin B12 supplements. I discussed with her the pros and cons of splenectomy but the patient declined further surgery at this point. I will continue prednisone at 5 mg daily that also treats her COPD.   her blood count appears to be responding to this and tolerating that well, keeping her hemoglobin >10 I will see her back in 12 months. I recommend calcium and vitamin D supplement to prevent osteoporosis I warned her about risk of GI bleed and ulcers with concurrent use of NSAID.  HTN (hypertension) Nicole Meyer has poorly controlled hypertension We discussed blood pressure monitoring Nicole Meyer will continue to follow-up closely with her primary care doctor for medication adjustment  Chronic fatigue Nicole Meyer has been complaining of chronic fatigue for over 2 years With her stable hemoglobin, it is unlikely that the anemia is a cause of this I would defer to her primary care doctor for further workup   No orders of the defined types were placed in this encounter.   INTERVAL HISTORY: Nicole Meyer 77 y.o. female returns for further follow-up Nicole Meyer complained of fatigue Nicole Meyer have occasional nausea but denies heartburn Her appetite is stable The patient has chronic fatigue for over 2 years of unknown etiology Nicole Meyer denies recent infection The patient denies any recent signs or symptoms of bleeding such as spontaneous epistaxis, hematuria or hematochezia.  SUMMARY OF HEMATOLOGIC HISTORY:  This is a pleasant lady with background history of severe anemia. In July of 2013 Nicole Meyer had bone marrow aspirate and biopsy that came back hypercellular but unremarkable. Nicole Meyer was recommended to take iron supplements.  Between November to December 2014, Nicole Meyer had multiple hospitalization requiring blood transfusion. Iron supplement was discontinued and Nicole Meyer was being observed. Nicole Meyer was also placed on prednisone. Osmotic fragility testing came back positive, suspicious for hemoglobinopathy such as spherocytosis. Since June 2016, Nicole Meyer was maintained on prednisone 5 mg daily  I have reviewed the past medical history, past surgical history, social history and family history with the patient and they are unchanged from previous note.  ALLERGIES:  is allergic to penicillins; brovana [arformoterol]; and paxil [paroxetine hcl].  MEDICATIONS:  Current Outpatient Medications  Medication Sig Dispense Refill  . albuterol (PROVENTIL HFA;VENTOLIN HFA) 108 (90 Base) MCG/ACT inhaler Inhale 2 puffs into the lungs every 6 (six) hours as needed for wheezing or shortness of breath.    . Alum Hydroxide-Mag Carbonate 160-105 MG CHEW Chew 1 tablet by mouth daily after breakfast.    . aspirin EC 81 MG tablet Take 81 mg by mouth daily after breakfast.     . Calcium Carb-Cholecalciferol (CALCIUM 600 + D PO) Take 2 tablets by mouth daily after breakfast.    . celecoxib (CELEBREX) 100 MG capsule Take 100 mg by mouth 2 (two) times daily.    [name] CRANBERRY PO Take 4,200 mg by mouth daily after breakfast.    . cyanocobalamin 500 MCG tablet Take 500 mcg by mouth daily after breakfast. Vitamin B12    . docusate sodium (COLACE) 100 MG capsule Take 100 mg by mouth daily.    . folic acid (FOLVITE) 379 MCG tablet Take 400 mcg by mouth daily after breakfast.    . HYDROcodone-acetaminophen (NORCO/VICODIN) 5-325 MG tablet Take 1 tablet by mouth 2 (two)  times daily.    [name] lisinopril (PRINIVIL) 10 MG tablet Take 1 tablet (10 mg total) by mouth daily. (Patient taking differently: Take 10 mg by mouth daily after breakfast. ) 30 tablet 2  . Misc Natural Products (ESTROVEN ENERGY PO) Take by mouth.    . Multiple Vitamins-Minerals (OCUVITE PO) Take 1 tablet by  mouth daily after breakfast.    . omeprazole (PRILOSEC) 20 MG capsule Take 1 capsule (20 mg total) by mouth daily. 30 capsule 0  . predniSONE (DELTASONE) 5 MG tablet Take 1 tablet (5 mg total) by mouth daily after breakfast. 90 tablet 3   No current facility-administered medications for this visit.      REVIEW OF SYSTEMS:   Constitutional: Denies fevers, chills or night sweats Eyes: Denies blurriness of vision Ears, nose, mouth, throat, and face: Denies mucositis or sore throat Respiratory: Denies cough, dyspnea or wheezes Cardiovascular: Denies palpitation, chest discomfort or lower extremity swelling Gastrointestinal:  Denies nausea, heartburn or change in bowel habits Skin: Denies abnormal skin rashes Lymphatics: Denies new lymphadenopathy or easy bruising Neurological:Denies numbness, tingling or new weaknesses Behavioral/Psych: Mood is stable, no new changes  All other systems were reviewed with the patient and are negative.  PHYSICAL EXAMINATION: ECOG PERFORMANCE STATUS: 1 - Symptomatic but completely ambulatory  Vitals:   05/20/17 1134  BP: (!) 175/73  Pulse: 77  Resp: 18  Temp: (!) 97.4 F (36.3 C)  SpO2: 100%   Filed Weights   05/20/17 1134  Weight: 147 lb 3.2 oz (66.8 kg)    GENERAL:alert, no distress and comfortable SKIN: skin color, texture, turgor are normal, no rashes or significant lesions EYES: normal, Conjunctiva are pink and non-injected, sclera clear OROPHARYNX:no exudate, no erythema and lips, buccal mucosa, and tongue normal  NECK: supple, thyroid normal size, non-tender, without nodularity LYMPH:  no palpable lymphadenopathy in the cervical, axillary or inguinal LUNGS: clear to auscultation and percussion with normal breathing effort HEART: regular rate & rhythm and no murmurs and no lower extremity edema ABDOMEN:abdomen soft, non-tender and normal bowel sounds Musculoskeletal:no cyanosis of digits and no clubbing  NEURO: alert & oriented x 3 with  fluent speech, no focal motor/sensory deficits  LABORATORY DATA:  I have reviewed the data as listed     Component Value Date/Time   NA 139 05/20/2017 1116   NA 139 08/23/2016 1022   K 4.6 05/20/2017 1116   K 4.8 08/23/2016 1022   CL 106 05/20/2017 1116   CL 107 07/06/2012 1005   CO2 27 05/20/2017 1116   CO2 22 08/23/2016 1022   GLUCOSE 101 05/20/2017 1116   GLUCOSE 113 08/23/2016 1022   GLUCOSE 97 07/06/2012 1005   BUN 22 05/20/2017 1116   BUN 24.9 08/23/2016 1022   CREATININE 1.18 (H) 05/20/2017 1116   CREATININE 1.3 (H) 08/23/2016 1022   CALCIUM 10.1 05/20/2017 1116   CALCIUM 9.7 08/23/2016 1022   PROT 7.2 05/20/2017 1116   PROT 6.9 08/23/2016 1022   ALBUMIN 3.9 05/20/2017 1116   ALBUMIN 3.7 08/23/2016 1022   AST 20 05/20/2017 1116   AST 18 08/23/2016 1022   ALT 18 05/20/2017 1116   ALT 16 08/23/2016 1022   ALKPHOS 86 05/20/2017 1116   ALKPHOS 87 08/23/2016 1022   BILITOT 0.7 05/20/2017 1116   BILITOT 0.80 08/23/2016 1022   GFRNONAA 43 (L) 05/20/2017 1116   GFRAA 50 (L) 05/20/2017 1116    No results found for: SPEP, UPEP  Lab Results  Component Value Date  WBC 4.6 05/20/2017   NEUTROABS 3.6 05/20/2017   HGB 11.5 (L) 05/20/2017   HCT 34.6 (L) 05/20/2017   MCV 94.3 05/20/2017   PLT 222 05/20/2017      Chemistry      Component Value Date/Time   NA 139 05/20/2017 1116   NA 139 08/23/2016 1022   K 4.6 05/20/2017 1116   K 4.8 08/23/2016 1022   CL 106 05/20/2017 1116   CL 107 07/06/2012 1005   CO2 27 05/20/2017 1116   CO2 22 08/23/2016 1022   BUN 22 05/20/2017 1116   BUN 24.9 08/23/2016 1022   CREATININE 1.18 (H) 05/20/2017 1116   CREATININE 1.3 (H) 08/23/2016 1022      Component Value Date/Time   CALCIUM 10.1 05/20/2017 1116   CALCIUM 9.7 08/23/2016 1022   ALKPHOS 86 05/20/2017 1116   ALKPHOS 87 08/23/2016 1022   AST 20 05/20/2017 1116   AST 18 08/23/2016 1022   ALT 18 05/20/2017 1116   ALT 16 08/23/2016 1022   BILITOT 0.7 05/20/2017 1116    BILITOT 0.80 08/23/2016 1022      I spent 15 minutes counseling the patient face to face. The total time spent in the appointment was 20 minutes and more than 50% was on counseling.   All questions were answered. The patient knows to call the clinic with any problems, questions or concerns. No barriers to learning was detected.    Heath Lark, MD 4/2/20191:43 PM

## 2017-05-20 NOTE — Assessment & Plan Note (Signed)
Screening tests for PNH was negative. 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 I recommend she takes high-dose folic acid and vitamin B12 supplements. I discussed with her the pros and cons of splenectomy but the patient declined further surgery at this point. I will continue prednisone at 5 mg daily that also treats her COPD.   her blood count appears to be responding to this and tolerating that well, keeping her hemoglobin >10 I will see her back in 12 months. I recommend calcium and vitamin D supplement to prevent osteoporosis I warned her about risk of GI bleed and ulcers with concurrent use of NSAID.

## 2017-05-21 ENCOUNTER — Telehealth: Payer: Self-pay | Admitting: Hematology and Oncology

## 2017-05-21 NOTE — Telephone Encounter (Signed)
Mailed patient calendar of upcoming April 2020 appointments.  °

## 2017-06-09 ENCOUNTER — Telehealth: Payer: Self-pay

## 2017-06-09 DIAGNOSIS — R011 Cardiac murmur, unspecified: Secondary | ICD-10-CM

## 2017-06-09 NOTE — Telephone Encounter (Signed)
Pt in office today with husband being seen by Dr Caryl Comes. Dr Caryl Comes requested and ECHO be ordered for her.

## 2017-06-11 ENCOUNTER — Other Ambulatory Visit (HOSPITAL_COMMUNITY): Payer: Medicare Other

## 2017-06-13 ENCOUNTER — Ambulatory Visit (HOSPITAL_COMMUNITY): Payer: Medicare Other | Attending: Cardiovascular Disease

## 2017-06-13 ENCOUNTER — Other Ambulatory Visit: Payer: Self-pay

## 2017-06-13 DIAGNOSIS — J449 Chronic obstructive pulmonary disease, unspecified: Secondary | ICD-10-CM | POA: Diagnosis not present

## 2017-06-13 DIAGNOSIS — N183 Chronic kidney disease, stage 3 (moderate): Secondary | ICD-10-CM | POA: Diagnosis not present

## 2017-06-13 DIAGNOSIS — E669 Obesity, unspecified: Secondary | ICD-10-CM | POA: Insufficient documentation

## 2017-06-13 DIAGNOSIS — Z6826 Body mass index (BMI) 26.0-26.9, adult: Secondary | ICD-10-CM | POA: Diagnosis not present

## 2017-06-13 DIAGNOSIS — I081 Rheumatic disorders of both mitral and tricuspid valves: Secondary | ICD-10-CM | POA: Diagnosis not present

## 2017-06-13 DIAGNOSIS — R011 Cardiac murmur, unspecified: Secondary | ICD-10-CM | POA: Diagnosis not present

## 2017-06-13 DIAGNOSIS — Z87891 Personal history of nicotine dependence: Secondary | ICD-10-CM | POA: Diagnosis not present

## 2017-06-13 DIAGNOSIS — I129 Hypertensive chronic kidney disease with stage 1 through stage 4 chronic kidney disease, or unspecified chronic kidney disease: Secondary | ICD-10-CM | POA: Diagnosis not present

## 2017-06-18 ENCOUNTER — Telehealth: Payer: Self-pay

## 2017-06-18 NOTE — Telephone Encounter (Signed)
-----   Message from Deboraha Sprang, MD sent at 06/18/2017 10:53 AM EDT ----- Please Inform Patient Nicole Meyer showed  Normal   heart muscle function    This ended up in my basket   dont know why  Did it happen on DOD day and if so is this a new policy If this is a mistake then ok If not then we need to figure out who is to own responsibility for an outpt Nicole Meyer

## 2017-06-18 NOTE — Telephone Encounter (Signed)
Pt is aware and agreeable to echo results. Test was ordered per patient request during and OV her husband had with Dr. Caryl Comes. Pt has no established cardiologist but would like PCP to have a copy of the study. I will fax/route.

## 2017-11-25 ENCOUNTER — Other Ambulatory Visit: Payer: Self-pay | Admitting: Family Medicine

## 2017-11-25 DIAGNOSIS — Z1231 Encounter for screening mammogram for malignant neoplasm of breast: Secondary | ICD-10-CM

## 2017-12-01 ENCOUNTER — Other Ambulatory Visit: Payer: Self-pay

## 2017-12-01 DIAGNOSIS — I739 Peripheral vascular disease, unspecified: Secondary | ICD-10-CM

## 2018-01-01 ENCOUNTER — Ambulatory Visit
Admission: RE | Admit: 2018-01-01 | Discharge: 2018-01-01 | Disposition: A | Discharge status: Home / Self Care | Payer: Medicare Other | Source: Ambulatory Visit | Attending: Family Medicine | Admitting: Family Medicine

## 2018-01-01 DIAGNOSIS — Z1231 Encounter for screening mammogram for malignant neoplasm of breast: Secondary | ICD-10-CM

## 2018-02-05 ENCOUNTER — Encounter (HOSPITAL_COMMUNITY): Payer: Medicare Other

## 2018-02-05 ENCOUNTER — Encounter: Payer: Medicare Other | Admitting: Vascular Surgery

## 2018-02-27 ENCOUNTER — Emergency Department (HOSPITAL_COMMUNITY): Payer: Medicare Other

## 2018-02-27 ENCOUNTER — Observation Stay (HOSPITAL_COMMUNITY)
Admission: EM | Admit: 2018-02-27 | Discharge: 2018-03-02 | Disposition: A | Discharge status: Home / Self Care | Payer: Medicare Other | Attending: Internal Medicine | Admitting: Internal Medicine

## 2018-02-27 ENCOUNTER — Other Ambulatory Visit: Payer: Self-pay

## 2018-02-27 DIAGNOSIS — N390 Urinary tract infection, site not specified: Secondary | ICD-10-CM | POA: Diagnosis not present

## 2018-02-27 DIAGNOSIS — I6350 Cerebral infarction due to unspecified occlusion or stenosis of unspecified cerebral artery: Principal | ICD-10-CM | POA: Insufficient documentation

## 2018-02-27 DIAGNOSIS — Z79899 Other long term (current) drug therapy: Secondary | ICD-10-CM | POA: Insufficient documentation

## 2018-02-27 DIAGNOSIS — N183 Chronic kidney disease, stage 3 unspecified: Secondary | ICD-10-CM | POA: Diagnosis present

## 2018-02-27 DIAGNOSIS — I739 Peripheral vascular disease, unspecified: Secondary | ICD-10-CM | POA: Diagnosis not present

## 2018-02-27 DIAGNOSIS — M48061 Spinal stenosis, lumbar region without neurogenic claudication: Secondary | ICD-10-CM | POA: Diagnosis not present

## 2018-02-27 DIAGNOSIS — D631 Anemia in chronic kidney disease: Secondary | ICD-10-CM | POA: Insufficient documentation

## 2018-02-27 DIAGNOSIS — I10 Essential (primary) hypertension: Secondary | ICD-10-CM | POA: Diagnosis present

## 2018-02-27 DIAGNOSIS — J45909 Unspecified asthma, uncomplicated: Secondary | ICD-10-CM | POA: Insufficient documentation

## 2018-02-27 DIAGNOSIS — D649 Anemia, unspecified: Secondary | ICD-10-CM | POA: Diagnosis present

## 2018-02-27 DIAGNOSIS — Z87891 Personal history of nicotine dependence: Secondary | ICD-10-CM | POA: Insufficient documentation

## 2018-02-27 DIAGNOSIS — I129 Hypertensive chronic kidney disease with stage 1 through stage 4 chronic kidney disease, or unspecified chronic kidney disease: Secondary | ICD-10-CM | POA: Insufficient documentation

## 2018-02-27 DIAGNOSIS — Z96651 Presence of right artificial knee joint: Secondary | ICD-10-CM | POA: Diagnosis not present

## 2018-02-27 DIAGNOSIS — R17 Unspecified jaundice: Secondary | ICD-10-CM | POA: Diagnosis present

## 2018-02-27 DIAGNOSIS — E785 Hyperlipidemia, unspecified: Secondary | ICD-10-CM

## 2018-02-27 DIAGNOSIS — R531 Weakness: Secondary | ICD-10-CM | POA: Diagnosis present

## 2018-02-27 DIAGNOSIS — N179 Acute kidney failure, unspecified: Secondary | ICD-10-CM

## 2018-02-27 DIAGNOSIS — I639 Cerebral infarction, unspecified: Secondary | ICD-10-CM

## 2018-02-27 LAB — CBC WITH DIFFERENTIAL/PLATELET
ABS IMMATURE GRANULOCYTES: 0.02 10*3/uL (ref 0.00–0.07)
Basophils Absolute: 0 10*3/uL (ref 0.0–0.1)
Basophils Relative: 0 %
Eosinophils Absolute: 0.6 10*3/uL — ABNORMAL HIGH (ref 0.0–0.5)
Eosinophils Relative: 10 %
HEMATOCRIT: 34.6 % — AB (ref 36.0–46.0)
HEMOGLOBIN: 11 g/dL — AB (ref 12.0–15.0)
IMMATURE GRANULOCYTES: 0 %
LYMPHS ABS: 0.2 10*3/uL — AB (ref 0.7–4.0)
LYMPHS PCT: 4 %
MCH: 30 pg (ref 26.0–34.0)
MCHC: 31.8 g/dL (ref 30.0–36.0)
MCV: 94.3 fL (ref 80.0–100.0)
MONOS PCT: 7 %
Monocytes Absolute: 0.4 10*3/uL (ref 0.1–1.0)
NEUTROS PCT: 79 %
NRBC: 0 % (ref 0.0–0.2)
Neutro Abs: 4.4 10*3/uL (ref 1.7–7.7)
Platelets: 179 10*3/uL (ref 150–400)
RBC: 3.67 MIL/uL — ABNORMAL LOW (ref 3.87–5.11)
RDW: 12.4 % (ref 11.5–15.5)
WBC: 5.6 10*3/uL (ref 4.0–10.5)

## 2018-02-27 LAB — URINALYSIS, ROUTINE W REFLEX MICROSCOPIC
BILIRUBIN URINE: NEGATIVE
Glucose, UA: NEGATIVE mg/dL
Ketones, ur: NEGATIVE mg/dL
NITRITE: NEGATIVE
PH: 5 (ref 5.0–8.0)
Protein, ur: 30 mg/dL — AB
SPECIFIC GRAVITY, URINE: 1.015 (ref 1.005–1.030)

## 2018-02-27 LAB — COMPREHENSIVE METABOLIC PANEL
ALBUMIN: 3.4 g/dL — AB (ref 3.5–5.0)
ALT: 14 U/L (ref 0–44)
ANION GAP: 10 (ref 5–15)
AST: 19 U/L (ref 15–41)
Alkaline Phosphatase: 72 U/L (ref 38–126)
BILIRUBIN TOTAL: 1.8 mg/dL — AB (ref 0.3–1.2)
BUN: 29 mg/dL — ABNORMAL HIGH (ref 8–23)
CO2: 21 mmol/L — ABNORMAL LOW (ref 22–32)
Calcium: 9.7 mg/dL (ref 8.9–10.3)
Chloride: 106 mmol/L (ref 98–111)
Creatinine, Ser: 1.52 mg/dL — ABNORMAL HIGH (ref 0.44–1.00)
GFR calc Af Amer: 38 mL/min — ABNORMAL LOW (ref >60)
GFR, EST NON AFRICAN AMERICAN: 32 mL/min — AB (ref >60)
Glucose, Bld: 112 mg/dL — ABNORMAL HIGH (ref 70–99)
POTASSIUM: 4.3 mmol/L (ref 3.5–5.1)
Sodium: 137 mmol/L (ref 135–145)
TOTAL PROTEIN: 7.2 g/dL (ref 6.5–8.1)

## 2018-02-27 MED ORDER — SODIUM CHLORIDE 0.9 % IV BOLUS
1000.0000 mL | Freq: Once | INTRAVENOUS | Status: AC
  Administered 2018-02-27: 1000 mL via INTRAVENOUS

## 2018-02-27 MED ORDER — SODIUM CHLORIDE 0.9 % IV SOLN
1.0000 g | Freq: Once | INTRAVENOUS | Status: AC
  Administered 2018-02-27: 1 g via INTRAVENOUS
  Filled 2018-02-27: qty 10

## 2018-02-27 NOTE — ED Notes (Signed)
Pt in restroom now. Wheeled to restroom via wheelchair.

## 2018-02-27 NOTE — ED Notes (Signed)
Report given to 3W RN. All questions answered.  

## 2018-02-27 NOTE — ED Notes (Signed)
Patient transported to MRI 

## 2018-02-27 NOTE — ED Triage Notes (Signed)
Pt brought in by [facility]. Per EMS pt has had pain in legs and generalized weakness for about 4 days. Pt reports that she has needed to use a walker since Tuesday and normally ambulates fine without one. Pt also reports a decreased PO intake due to a decreased appetite.

## 2018-02-27 NOTE — ED Notes (Signed)
Pt is requesting something to drink. Attending RN aware and will talk to pt.

## 2018-02-27 NOTE — ED Provider Notes (Signed)
[facility] EMERGENCY DEPARTMENT Provider Note   CSN: [id number] Arrival date & time: 02/27/18  1530     History   Chief Complaint Chief Complaint  Patient presents with  . Weakness    HPI TEYA OTTERSON is a 78 y.o. female.  The history is provided by the patient and medical records. No language interpreter was used.   BEYONCE SAWATZKY is a 78 y.o. female  with a PMH of PAD, spinal stenosis, COPD, HTN, HLD, CKD who presents to the Emergency Department complaining of bilateral lower extremity weakness and numbness from the knee down which began 3 days ago. Patient states that she just feels as if her legs are weak.  He ambulates at home without any assistive devices.  Since Tuesday, she has been having to use a walker to ambulate safely.  At onset, she thought maybe she had the flu, therefore checked a temperature.  She states that it was "a little high", but that it was not over 100. She does report back pain which is chronic for her, but feels as if her symptoms are a little worse today. She denies any cough, congestion, abdominal pain, n/v, urinary symptoms.  No medications taken prior to arrival for symptoms. Denies hx of similar.    Past Medical History:  Diagnosis Date  . Anxiety   . Anxiety   . Arthritis    "knees and back" (02/12/2013)  . Arthritis   . Asthma    no problems recently  . Blood dyscrasia    sperocytosis  . Cancer ([place]) 04-06-12   skin cancer nasal bridge-no problems now  . Chronic kidney disease (CKD), stage III (moderate) (HCC)   . COPD (chronic obstructive pulmonary disease) ([place])   . Depression   . Depression   . Diverticulitis   . Fatigue   . GERD (gastroesophageal reflux disease)   . Gout 04/05/2013  . H/O hiatal hernia   . History of blood transfusion    "2 units in 12/2012; suppose to get 1 unit today" (02/12/2013)  . HTN (hypertension)   . Hyperbilirubinemia 05/03/2013  . Hypercalcemia   . Hypercholesteremia    "above borderline; I don't take RX for it" (12/26/2011)  . IBS (irritable bowel syndrome)   . IBS (irritable bowel syndrome)    ibs  . Iron deficiency anemia    [facility]  . Leukocytopenia   . Lumbar spinal stenosis   . Lymphopenia 08/23/2011  . Osteoarthritis   . Osteoporosis   . PMR (polymyalgia rheumatica) (HCC)    pmr  . Polymyalgia ([place])   . Renal insufficiency   . Syncope and collapse 12/24/2011   "loss of consciousness for 3-4 min" (12/26/2011)  . UTI (urinary tract infection) 12/26/2011    Patient Active Problem List   Diagnosis Date Noted  . Acute CVA (cerebrovascular accident) ([place]) 02/27/2018  . S/P total knee replacement using cement 05/19/2015  . Chronic joint pain 08/30/2013  . Hyperbilirubinemia 05/03/2013  . Abdominal pain 01/03/2013  . Protein-calorie malnutrition, moderate ([place]) 04/10/2012  . Umbilical hernia s/p primary repair 04/08/2012 03/30/2012  . Obesity (BMI 30-39.9) 01/08/2012  . 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 04/08/2012 12/26/2011  . Chest pain, noncardiac - probably due to giant hiatal hernia 12/25/2011  . COPD (chronic obstructive pulmonary disease) ([place]) 12/24/2011  . Lymphopenia 08/23/2011  . 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 Leukocytopenia   . HTN (hypertension)   . Asthma   .  IBS (irritable bowel syndrome)   . Arthritis   . Anxiety   . Chronic kidney disease (CKD), stage III (moderate) (HCC)   . Chronic fatigue     Past Surgical History:  Procedure Laterality Date  . APPENDECTOMY  80  . arthroscopic knee surgery-left    . BREAST EXCISIONAL BIOPSY Right 1995  . BREAST SURGERY  04-06-12   rt. lumpectomy_benign  . carpal tunnel right    . COLONOSCOPY, ESOPHAGOGASTRODUODENOSCOPY (EGD) AND ESOPHAGEAL DILATION     06/2013  . ESOPHAGOGASTRODUODENOSCOPY  12/26/2011   Procedure: ESOPHAGOGASTRODUODENOSCOPY (EGD);  Surgeon: Inda Castle, MD;  Location: [facility];  Service: Endoscopy;   Laterality: N/A;  . EYE SURGERY     cataracts  . HERNIA REPAIR  1970's   "stomach"   . LAPAROSCOPIC NISSEN FUNDOPLICATION N/A 0/93/2355   Procedure: Laparoscopic Reduction and Repair of Paraesophagel Hiatal Hernia with Nissen;  Surgeon: Adin Hector, MD;  Location: [facility];  Service: General;  Laterality: N/A;  Laparoscopic Reduction and Repair of Paraesophagel Hiatal Hernia with Nissen  . NISSEN FUNDOPLICATION     08/3218  . TOTAL KNEE ARTHROPLASTY Right 05/19/2015  . TOTAL KNEE ARTHROPLASTY Right 05/19/2015   Procedure: RIGHT TOTAL KNEE ARTHROPLASTY;  Surgeon: Netta Cedars, MD;  Location: [facility];  Service: Orthopedics;  Laterality: Right;  . TUBAL LIGATION  1970's  . VAGINAL HYSTERECTOMY  ~ 1980   right ovary     OB History   No obstetric history on file.      Home Medications    Prior to Admission medications   Medication Sig Start Date End Date Taking? Authorizing Provider  albuterol (PROVENTIL HFA;VENTOLIN HFA) 108 (90 Base) MCG/ACT inhaler Inhale 2 puffs into the lungs every 6 (six) hours as needed for wheezing or shortness of breath.   Yes [provider]  Alum Hydroxide-Mag Carbonate 160-105 MG CHEW Chew 1 tablet by mouth daily after breakfast.   Yes [provider]  aspirin EC 81 MG tablet Take 81 mg by mouth daily after breakfast.    Yes [provider]  Calcium Carb-Cholecalciferol (CALCIUM 600 + D PO) Take 2 tablets by mouth daily after breakfast.   Yes [provider]  celecoxib (CELEBREX) 100 MG capsule Take 100 mg by mouth 2 (two) times daily.   Yes [provider]  cyanocobalamin 500 MCG tablet Take 500 mcg by mouth daily after breakfast. Vitamin B12   Yes [provider]  folic acid (FOLVITE) 254 MCG tablet Take 400 mcg by mouth daily after breakfast.   Yes [provider]  lisinopril (PRINIVIL) 10 MG tablet Take 1 tablet (10 mg total) by mouth daily. Patient taking differently: Take 10 mg by mouth daily  after breakfast.  02/15/13  Yes Darrick Meigs, Marge Duncans, MD  Misc Natural Products (ESTROVEN ENERGY PO) Take 1 tablet by mouth daily.    Yes [provider]  Multiple Vitamins-Minerals (OCUVITE PO) Take 1 tablet by mouth daily after breakfast.   Yes [provider]  naproxen sodium (ALEVE) 220 MG tablet Take 660 mg by mouth daily as needed (mild pain).   Yes [provider]  omeprazole (PRILOSEC) 20 MG capsule Take 1 capsule (20 mg total) by mouth daily. 07/21/15  Yes Harvel Quale, MD  predniSONE (DELTASONE) 5 MG tablet Take 1 tablet (5 mg total) by mouth daily after breakfast. 08/23/16  Yes Heath Lark, MD    Family History Family History  Problem Relation Age of Onset  . Diabetes  Father   . Heart disease Father   . Breast cancer Maternal Aunt        in 27's    Social History Social History   Tobacco Use  . Smoking status: Former Smoker    Packs/day: 1.00    Years: 40.00    Pack years: 40.00    Types: Cigarettes    Last attempt to quit: 02/19/2000    Years since quitting: 18.0  . Smokeless tobacco: Never Used  Substance Use Topics  . Alcohol use: No  . Drug use: No     Allergies   Penicillins; Brovana [arformoterol]; and Paxil [paroxetine hcl]   Review of Systems Review of Systems  Musculoskeletal: Positive for back pain.  Neurological: Positive for weakness and numbness.  All other systems reviewed and are negative.    Physical Exam Updated Vital Signs BP (!) 156/67   Pulse 65   Temp 98.4 F (36.9 C)   Resp 18   Ht 5' 1.5" (1.562 m)   Wt 75.3 kg   SpO2 100%   BMI 30.86 kg/m   Physical Exam Vitals signs and nursing note reviewed.  Constitutional:      General: She is not in acute distress.    Appearance: She is well-developed.  HENT:     Head: Normocephalic and atraumatic.  Cardiovascular:     Rate and Rhythm: Normal rate and regular rhythm.     Heart sounds: Normal heart sounds. No murmur.  Pulmonary:     Effort: Pulmonary  effort is normal. No respiratory distress.     Breath sounds: Normal breath sounds.  Abdominal:     General: There is no distension.     Palpations: Abdomen is soft.     Tenderness: There is no abdominal tenderness.  Musculoskeletal:     Comments: + midline lumbar spinal tenderness. Full ROM and 5/5 muscle strength to all four extremities. Sensation equal and intact throughout LE's.   Skin:    General: Skin is warm and dry.     Comments: Feet somewhat cool to the touch. Unable to palpate DP or PT pulses, however these pulses were able to be found with doppler.   Neurological:     Mental Status: She is alert and oriented to person, place, and time.      ED Treatments / Results  Labs (all labs ordered are listed, but only abnormal results are displayed) Labs Reviewed  CBC WITH DIFFERENTIAL/PLATELET - Abnormal; Notable for the following components:      Result Value   RBC 3.67 (*)    Hemoglobin 11.0 (*)    HCT 34.6 (*)    Lymphs Abs 0.2 (*)    Eosinophils Absolute 0.6 (*)    All other components within normal limits  COMPREHENSIVE METABOLIC PANEL - Abnormal; Notable for the following components:   CO2 21 (*)    Glucose, Bld 112 (*)    BUN 29 (*)    Creatinine, Ser 1.52 (*)    Albumin 3.4 (*)    Total Bilirubin 1.8 (*)    GFR calc non Af Amer 32 (*)    GFR calc Af Amer 38 (*)    All other components within normal limits  URINALYSIS, ROUTINE W REFLEX MICROSCOPIC - Abnormal; Notable for the following components:   APPearance HAZY (*)    Hgb urine dipstick SMALL (*)    Protein, ur 30 (*)    Leukocytes, UA MODERATE (*)    Bacteria, UA MANY (*)  All other components within normal limits  URINE CULTURE    EKG EKG Interpretation  Date/Time:  Friday February 27 2018 15:36:59 EST Ventricular Rate:  71 PR Interval:    QRS Duration: 90 QT Interval:  349 QTC Calculation: 380 R Axis:   -17 Text Interpretation:  Sinus rhythm Borderline left axis deviation Low voltage,  precordial leads No significant change since last tracing Confirmed by Wandra Arthurs [phone]) on 02/27/2018 3:43:50 PM   Radiology Dg Chest 2 View  Result Date: 02/27/2018 CLINICAL DATA:  Generalized weakness for several days EXAM: CHEST - 2 VIEW COMPARISON:  02/12/2013 FINDINGS: Cardiac shadows within normal limits. Aortic calcifications are noted. The lungs are well aerated bilaterally. No focal infiltrate is seen. No acute bony abnormality is noted. IMPRESSION: No active cardiopulmonary disease. Electronically Signed   By: Inez Catalina [name]   On: 02/27/2018 17:19   Ct Head Wo Contrast  Result Date: 02/27/2018 CLINICAL DATA:  Altered level of consciousness unexplained. EXAM: CT HEAD WITHOUT CONTRAST TECHNIQUE: Contiguous axial images were obtained from the base of the skull through the vertex without intravenous contrast. COMPARISON:  02/12/2013 FINDINGS: BRAIN: There is chronic mild sulcal and ventricular prominence consistent with superficial and central atrophy. Small 8 mm focus of hypodensity in the right posterior basal ganglion consistent with lacunar infarct is identified, new since 2014 but age-indeterminate otherwise. No intraparenchymal hemorrhage, mass effect nor midline shift. Mild periventricular and subcortical white matter hypodensities consistent with chronic small vessel ischemic disease are redemonstrated. No acute large vascular territory infarcts. No abnormal extra-axial fluid collections. Basal cisterns are not effaced and midline. The brainstem and cerebellum appear nonacute. VASCULAR: Moderate calcific atherosclerosis of the carotid siphons. SKULL: No skull fracture. No significant scalp soft tissue swelling. SINUSES/ORBITS: The mastoid air-cells are clear. The included paranasal sinuses are well-aerated.The included ocular globes and orbital contents are non-suspicious. OTHER: None. IMPRESSION: 1. Atrophy with chronic small vessel ischemic disease. 2. Small 8 mm focus of hypodensity  in the right posterior basal ganglia consistent with lacunar infarct, new since 2014 but age-indeterminate otherwise. Electronically Signed   By: Ashley Royalty [name]   On: 02/27/2018 17:50   Mr Brain Wo Contrast  Result Date: 02/27/2018 CLINICAL DATA:  Ataxia, stroke suspected. Weakness for 4 days. Patient now requires a walker for 4 days. EXAM: MRI HEAD WITHOUT CONTRAST TECHNIQUE: Multiplanar, multiecho pulse sequences of the brain and surrounding structures were obtained without intravenous contrast. COMPARISON:  CT head without contrast 02/27/2018 and 02/12/13 FINDINGS: Brain: The diffusion-weighted images confirm an acute nonhemorrhagic infarct in the posterior right lentiform nucleus associated T2 signal changes are present. A remote posterior right frontal lobe infarct is noted more superiorly. Mild generalized atrophy and white matter disease is present otherwise. The ventricles are of proportionate to the degree of atrophy. No significant extraaxial fluid collection is present. Mild white matter changes extend into the brainstem. Cerebellum is within normal limits. Vascular: Flow is present in the major intracranial arteries. Skull and upper cervical spine: The craniocervical junction is normal. Upper cervical spine is within normal limits. Marrow signal is unremarkable. Sinuses/Orbits: The paranasal sinuses and mastoid air cells are clear. Bilateral lens replacements are noted. Globes and orbits are otherwise unremarkable. IMPRESSION: 1. Acute/subacute 11 mm nonhemorrhagic infarct in the posterior right lentiform nucleus. 2. Remote more superior posterior right frontal lobe infarct. 3. Mild generalized atrophy and white matter disease is otherwise within normal limits for age. Electronically Signed   By: Wynetta Fines.D.  On: 02/27/2018 19:42   Mr Lumbar Spine Wo Contrast  Result Date: 02/27/2018 CLINICAL DATA:  Initial evaluation for acute bilateral lower extremity numbness, difficulty with  ambulation. EXAM: MRI LUMBAR SPINE WITHOUT CONTRAST TECHNIQUE: Multiplanar, multisequence MR imaging of the lumbar spine was performed. No intravenous contrast was administered. COMPARISON:  None available. FINDINGS: Segmentation: Normal segmentation. Lowest well-formed disc labeled the L5-S1 level. Alignment: Mild roto dextroscoliosis. Trace anterolisthesis of L2 on L3 and L3 on L4. Vertebrae: Vertebral body heights maintained without evidence for acute or chronic fracture. Bone marrow signal intensity within normal limits. Reactive endplate changes noted about the L4-5 interspace. 13 mm benign hemangioma noted within the L2 vertebral body. No other discrete or worrisome osseous lesions. No other abnormal marrow edema. Conus medullaris and cauda equina: Conus extends to the T12-L1 level. Conus and cauda equina appear normal. Paraspinal and other soft tissues: Paraspinous soft tissues demonstrate no acute finding. Chronic fatty atrophy noted within the paraspinous musculature posteriorly. Visualized visceral structures unremarkable. Disc levels: L1-2: Minimal annular disc bulge. Mild bilateral facet hypertrophy. Trace joint effusion on the right. No significant stenosis. L2-3: Trace anterolisthesis. Circumferential disc bulge with disc desiccation and intervertebral disc space narrowing. Moderate facet and ligament flavum hypertrophy. Trace bilateral joint effusions. Resultant borderline mild spinal stenosis. Mild bilateral L2 foraminal narrowing. L3-4: Trace anterolisthesis. Diffuse circumferential disc bulge with disc desiccation and intervertebral disc space narrowing. Superimposed broad-based left subarticular disc protrusion extends into the left lateral recess (series 8, image 17). Superimposed moderate facet and ligament flavum hypertrophy. Trace joint effusion on the right. Resultant moderate canal with left lateral recess stenosis. Mild to moderate left L3 foraminal narrowing. L4-5: Diffuse disc bulge with  disc desiccation and intervertebral disc space narrowing. Chronic reactive endplate changes with marginal endplate osteophytic spurring. Moderate facet and ligament flavum hypertrophy. Trace bilateral joint effusions. Resultant mild left lateral recess narrowing without significant spinal stenosis. Moderate left L4 foraminal narrowing. L5-S1: Mild disc bulge. Mild to moderate facet and ligament flavum hypertrophy. Trace bilateral joint effusions. Mild epidural lipomatosis. No significant spinal stenosis. Moderate bilateral L5 foraminal narrowing. IMPRESSION: 1. No acute abnormality within the lumbar spine. No evidence for cord compression. 2. Multifactorial degenerative changes at L3-4 with resultant moderate canal and left lateral recess stenosis, with mild to moderate left L3 foraminal narrowing. Either the left L3 or descending L4 nerve roots could be affected. 3. Disc bulging with moderate facet hypertrophy at L4-5 with resultant mild left lateral recess stenosis, with moderate left L4 foraminal narrowing. 4. Disc bulging with facet hypertrophy at L5-S1 with resultant moderate bilateral L5 foraminal stenosis. Electronically Signed   By: Jeannine Boga [name]   On: 02/27/2018 18:55    Procedures Procedures (including critical care time)  Medications Ordered in ED Medications  cefTRIAXone (ROCEPHIN) 1 g in sodium chloride 0.9 % 100 mL IVPB (1 g Intravenous New Bag/Given 02/27/18 2143)  sodium chloride 0.9 % bolus 1,000 mL (0 mLs Intravenous Stopped 02/27/18 1829)     Initial Impression / Assessment and Plan / ED Course  I have reviewed the triage vital signs and the nursing notes.  Pertinent labs & imaging results that were available during my care of the patient were reviewed by me and considered in my medical decision making (see chart for details).    KESSIE CROSTON is a 78 y.o. female who presents to ED for bilateral lower extremity weakness for the last few days associated with  difficulty with ambulation.  Typically walks around independently, but  since Tuesday, has been using a walker.  On exam, she does have a wide-based, unsteady gait.  No other neurologic deficits appreciated on exam.  She does have decreased pulses to her lower extremities which were dopplerable.  This appears to be a chronic issue with consultation for ABI's ordered. She states that she has been referred to vascular for poor circulation issues.  She does have history of lumbar stenosis and tenderness to palpation of the L-spine, therefore MRI was obtained with no acute abnormalities, however she does have some degenerative findings.  MRI of the brain does show an acute/subacute 11 mm nonhemorrhagic infarct posteriorly.  I discussed the case with neurology, Dr. Leonel Ramsay.  Recommends medical admission and neurology will follow in consultation.  Urinalysis with moderate leukocytes and many bacteria with 6-10 WBCs.  Sent for culture. Given dose of rocephin. Hospitalist consulted who will admit.   Patient seen by and discussed with Dr. Darl Householder who agrees with treatment plan.   Final Clinical Impressions(s) / ED Diagnoses   Final diagnoses:  Cerebrovascular accident (CVA), unspecified mechanism [facility])    ED Discharge Orders    None       Vergie Zahm, Ozella Almond, PA-C 02/27/18 2158    Drenda Freeze, MD 03/02/18 [phone]

## 2018-02-27 NOTE — ED Notes (Signed)
Unable to locate pt glasses, stood pt up and looked in sheets and in bed, unable to locate. Called CT, per Micah he placed them in the bed. Looked again and still unable to find.

## 2018-02-28 ENCOUNTER — Observation Stay (HOSPITAL_COMMUNITY): Payer: Medicare Other

## 2018-02-28 ENCOUNTER — Observation Stay (HOSPITAL_BASED_OUTPATIENT_CLINIC_OR_DEPARTMENT_OTHER): Payer: Medicare Other

## 2018-02-28 DIAGNOSIS — I10 Essential (primary) hypertension: Secondary | ICD-10-CM

## 2018-02-28 DIAGNOSIS — I739 Peripheral vascular disease, unspecified: Secondary | ICD-10-CM | POA: Diagnosis present

## 2018-02-28 DIAGNOSIS — I639 Cerebral infarction, unspecified: Secondary | ICD-10-CM

## 2018-02-28 DIAGNOSIS — N3000 Acute cystitis without hematuria: Secondary | ICD-10-CM

## 2018-02-28 DIAGNOSIS — M48061 Spinal stenosis, lumbar region without neurogenic claudication: Secondary | ICD-10-CM

## 2018-02-28 DIAGNOSIS — I6389 Other cerebral infarction: Secondary | ICD-10-CM

## 2018-02-28 DIAGNOSIS — E785 Hyperlipidemia, unspecified: Secondary | ICD-10-CM

## 2018-02-28 DIAGNOSIS — D649 Anemia, unspecified: Secondary | ICD-10-CM | POA: Diagnosis present

## 2018-02-28 DIAGNOSIS — N183 Chronic kidney disease, stage 3 (moderate): Secondary | ICD-10-CM

## 2018-02-28 LAB — HEMOGLOBIN A1C
Hgb A1c MFr Bld: 5.2 % (ref 4.8–5.6)
Mean Plasma Glucose: 102.54 mg/dL

## 2018-02-28 LAB — RESPIRATORY PANEL BY PCR
Adenovirus: NOT DETECTED
Bordetella pertussis: NOT DETECTED
CHLAMYDOPHILA PNEUMONIAE-RVPPCR: NOT DETECTED
Coronavirus 229E: NOT DETECTED
Coronavirus HKU1: NOT DETECTED
Coronavirus NL63: NOT DETECTED
Coronavirus OC43: NOT DETECTED
Influenza A: NOT DETECTED
Influenza B: NOT DETECTED
Metapneumovirus: NOT DETECTED
Mycoplasma pneumoniae: NOT DETECTED
PARAINFLUENZA VIRUS 3-RVPPCR: NOT DETECTED
Parainfluenza Virus 1: NOT DETECTED
Parainfluenza Virus 2: NOT DETECTED
Parainfluenza Virus 4: NOT DETECTED
Respiratory Syncytial Virus: NOT DETECTED
Rhinovirus / Enterovirus: NOT DETECTED

## 2018-02-28 LAB — LIPID PANEL
Cholesterol: 154 mg/dL (ref 0–200)
HDL: 22 mg/dL — ABNORMAL LOW (ref >40)
LDL CALC: 101 mg/dL — AB (ref 0–99)
Total CHOL/HDL Ratio: 7 RATIO
Triglycerides: 156 mg/dL — ABNORMAL HIGH (ref <150)
VLDL: 31 mg/dL (ref 0–40)

## 2018-02-28 LAB — BASIC METABOLIC PANEL
Anion gap: 12 (ref 5–15)
BUN: 26 mg/dL — ABNORMAL HIGH (ref 8–23)
CO2: 20 mmol/L — ABNORMAL LOW (ref 22–32)
Calcium: 8.9 mg/dL (ref 8.9–10.3)
Chloride: 107 mmol/L (ref 98–111)
Creatinine, Ser: 1.39 mg/dL — ABNORMAL HIGH (ref 0.44–1.00)
GFR calc Af Amer: 42 mL/min — ABNORMAL LOW (ref >60)
GFR calc non Af Amer: 36 mL/min — ABNORMAL LOW (ref >60)
Glucose, Bld: 115 mg/dL — ABNORMAL HIGH (ref 70–99)
Potassium: 4.1 mmol/L (ref 3.5–5.1)
Sodium: 139 mmol/L (ref 135–145)

## 2018-02-28 LAB — ECHOCARDIOGRAM COMPLETE
HEIGHTINCHES: 61.5 in
Weight: 2504.43 oz

## 2018-02-28 MED ORDER — FOLIC ACID 1 MG PO TABS
1.0000 mg | ORAL_TABLET | Freq: Every day | ORAL | Status: DC
  Administered 2018-02-28 – 2018-03-02 (×3): 1 mg via ORAL
  Filled 2018-02-28 (×4): qty 1

## 2018-02-28 MED ORDER — ALUM HYDROXIDE-MAG CARBONATE 160-105 MG PO CHEW: 1.0000 | CHEWABLE_TABLET | Freq: Every day | ORAL | Status: DC

## 2018-02-28 MED ORDER — GABAPENTIN 600 MG PO TABS
300.0000 mg | ORAL_TABLET | Freq: Two times a day (BID) | ORAL | Status: DC
  Administered 2018-02-28 – 2018-03-02 (×5): 300 mg via ORAL
  Filled 2018-02-28 (×5): qty 1

## 2018-02-28 MED ORDER — ATORVASTATIN CALCIUM 40 MG PO TABS
40.0000 mg | ORAL_TABLET | Freq: Every day | ORAL | Status: DC
  Administered 2018-02-28 – 2018-03-01 (×2): 40 mg via ORAL
  Filled 2018-02-28 (×2): qty 1

## 2018-02-28 MED ORDER — ALBUTEROL SULFATE (2.5 MG/3ML) 0.083% IN NEBU: 2.5000 mg | INHALATION_SOLUTION | Freq: Four times a day (QID) | RESPIRATORY_TRACT | Status: DC | PRN

## 2018-02-28 MED ORDER — SODIUM CHLORIDE 0.9 % IV SOLN
1.0000 g | Freq: Every day | INTRAVENOUS | Status: DC
  Administered 2018-02-28: 1 g via INTRAVENOUS
  Filled 2018-02-28 (×2): qty 10

## 2018-02-28 MED ORDER — ACETAMINOPHEN 160 MG/5ML PO SOLN: 650.0000 mg | ORAL | Status: DC | PRN

## 2018-02-28 MED ORDER — IOPAMIDOL (ISOVUE-370) INJECTION 76%
INTRAVENOUS | Status: AC
  Administered 2018-02-28: 50 mL
  Filled 2018-02-28: qty 50

## 2018-02-28 MED ORDER — ACETAMINOPHEN 650 MG RE SUPP: 650.0000 mg | RECTAL | Status: DC | PRN

## 2018-02-28 MED ORDER — PANTOPRAZOLE SODIUM 40 MG PO TBEC
40.0000 mg | DELAYED_RELEASE_TABLET | Freq: Every day | ORAL | Status: DC
  Administered 2018-02-28 – 2018-03-02 (×3): 40 mg via ORAL
  Filled 2018-02-28 (×3): qty 1

## 2018-02-28 MED ORDER — ACETAMINOPHEN 325 MG PO TABS: 650.0000 mg | ORAL_TABLET | ORAL | Status: DC | PRN

## 2018-02-28 MED ORDER — SODIUM CHLORIDE 0.9 % IV SOLN
INTRAVENOUS | Status: AC
  Administered 2018-02-28: 05:00:00 via INTRAVENOUS

## 2018-02-28 MED ORDER — STROKE: EARLY STAGES OF RECOVERY BOOK
Freq: Once | Status: AC
  Administered 2018-02-28: 06:00:00
  Filled 2018-02-28: qty 1

## 2018-02-28 MED ORDER — CLOPIDOGREL BISULFATE 75 MG PO TABS
75.0000 mg | ORAL_TABLET | Freq: Every day | ORAL | Status: DC
  Administered 2018-02-28 – 2018-03-02 (×3): 75 mg via ORAL
  Filled 2018-02-28 (×3): qty 1

## 2018-02-28 MED ORDER — ALUM HYDROXIDE-MAG CARBONATE 95-358 MG/15ML PO SUSP
15.0000 mL | Freq: Every day | ORAL | Status: DC
  Administered 2018-03-01 – 2018-03-02 (×2): 15 mL via ORAL
  Filled 2018-02-28 (×3): qty 15

## 2018-02-28 MED ORDER — VITAMIN B-12 100 MCG PO TABS
500.0000 ug | ORAL_TABLET | Freq: Every day | ORAL | Status: DC
  Administered 2018-02-28 – 2018-03-02 (×3): 500 ug via ORAL
  Filled 2018-02-28 (×3): qty 5

## 2018-02-28 MED ORDER — ENOXAPARIN SODIUM 30 MG/0.3ML ~~LOC~~ SOLN
30.0000 mg | SUBCUTANEOUS | Status: DC
  Administered 2018-02-28 – 2018-03-02 (×3): 30 mg via SUBCUTANEOUS
  Filled 2018-02-28 (×3): qty 0.3

## 2018-02-28 MED ORDER — ASPIRIN EC 325 MG PO TBEC
325.0000 mg | DELAYED_RELEASE_TABLET | Freq: Every day | ORAL | Status: DC
  Administered 2018-02-28: 325 mg via ORAL
  Filled 2018-02-28: qty 1

## 2018-02-28 MED ORDER — ASPIRIN EC 81 MG PO TBEC
81.0000 mg | DELAYED_RELEASE_TABLET | Freq: Every day | ORAL | Status: DC
  Administered 2018-03-01 – 2018-03-02 (×2): 81 mg via ORAL
  Filled 2018-02-28 (×2): qty 1

## 2018-02-28 NOTE — Progress Notes (Signed)
  Echocardiogram 2D Echocardiogram has been performed.  Jennette Dubin 02/28/2018, 8:34 AM

## 2018-02-28 NOTE — Progress Notes (Signed)
STROKE TEAM PROGRESS NOTE   SUBJECTIVE (INTERVAL HISTORY) Her husband and daughter are at the bedside.  Patient lying in bed, no acute distress.  Overnight no acute event.  Still complains of bilateral lower extremity weaker than normal, able to walk with walker with PT/OT but still weak.  Denies 1 leg over another but bilateral lower extremity weakness.  OBJECTIVE Vitals:   02/28/18 0010 02/28/18 0238 02/28/18 0434 02/28/18 0715  BP: 139/66 (!) 151/65 (!) 135/56 (!) 142/70  Pulse: 67 74 73 69  Resp: 18 18 20 19   Temp: 98.7 F (37.1 C) 98.8 F (37.1 C) 98.5 F (36.9 C) 98.8 F (37.1 C)  TempSrc: Oral Oral Oral Oral  SpO2: 97% 98% 96% 98%  Weight:      Height:        CBC:  Recent Labs  Lab 02/27/18 1545  WBC 5.6  NEUTROABS 4.4  HGB 11.0*  HCT 34.6*  MCV 94.3  PLT 476    Basic Metabolic Panel:  Recent Labs  Lab 02/27/18 1545 02/28/18 0607  NA 137 139  K 4.3 4.1  CL 106 107  CO2 21* 20*  GLUCOSE 112* 115*  BUN 29* 26*  CREATININE 1.52* 1.39*  CALCIUM 9.7 8.9    Lipid Panel:     Component Value Date/Time   CHOL 154 02/28/2018 0607   TRIG 156 (H) 02/28/2018 0607   HDL 22 (L) 02/28/2018 0607   CHOLHDL 7.0 02/28/2018 0607   VLDL 31 02/28/2018 0607   LDLCALC 101 (H) 02/28/2018 0607   HgbA1c:  Lab Results  Component Value Date   HGBA1C 5.2 02/28/2018   Urine Drug Screen: No results found for: LABOPIA, COCAINSCRNUR, LABBENZ, AMPHETMU, THCU, LABBARB  Alcohol Level No results found for: ETH  IMAGING  Ct Angio Head W Or Wo Contrast  Result Date: 02/28/2018 CLINICAL DATA:  Right basal ganglia acute infarct. Ataxia. Weakness. EXAM: CT ANGIOGRAPHY HEAD AND NECK TECHNIQUE: Multidetector CT imaging of the head and neck was performed using the standard protocol during bolus administration of intravenous contrast. Multiplanar CT image reconstructions and MIPs were obtained to evaluate the vascular anatomy. Carotid stenosis measurements (when applicable) are obtained  utilizing NASCET criteria, using the distal internal carotid diameter as the denominator. CONTRAST:  58mL ISOVUE-370 IOPAMIDOL (ISOVUE-370) INJECTION 76% COMPARISON:  MRI brain 02/27/2018. FINDINGS: CTA NECK FINDINGS Aortic arch: A 3 vessel arch configuration is present. Atherosclerotic calcifications are present at the aortic arch without significant stenosis of the great vessel origins. Right carotid system: The right common carotid artery is within normal limits. Dense calcifications are present at the bifurcation. Minimal luminal diameter is 3 mm. There is no significant stenosis relative to the more distal vessel. The distal cervical left ICA right ICA is normal. Left carotid system: The left common carotid artery is tortuous without other significant atherosclerotic calcifications proximal to the bifurcation. Dense calcifications are present at the bifurcation. Minimal luminal diameter is 2.5 mm. This compares with a more distal measurement 4.6 mm. Vertebral arteries: The left vertebral artery is the dominant vessel. Focal calcification is present at the origin the non dominant right vertebral artery. There is no significant stenosis at the origin of the left vertebral artery from the subclavian. No additional stenoses are present in either vertebral artery in the neck. Skeleton: Degenerative disc disease is most evident at C5-6. Vertebral body heights are preserved. Patient is edentulous. No focal lytic or blastic lesions are present. Other neck: Soft tissues the neck are otherwise unremarkable. Salivary  glands are within normal limits. No focal lytic or blastic lesions are present. Upper chest: Lung apices are clear. Thoracic inlet is within normal limits. Esophagus is dilated. Review of the MIP images confirms the above findings CTA HEAD FINDINGS Anterior circulation: Atherosclerotic calcifications are present throughout the cavernous internal carotid arteries bilaterally without a significant luminal  stenosis relative to the more distal vessels. The ICA termini are within normal limits bilaterally. The A1 and M1 segments are normal. The anterior communicating artery is patent. MCA bifurcations are intact. ACA and MCA branch vessels demonstrate mild distal small vessel disease without a significant proximal stenosis or occlusion. Posterior circulation: The left vertebral artery is the dominant vessel. Vertebrobasilar junction is normal. Basilar artery is small. The left posterior cerebral artery originates from the basilar tip. The right posterior cerebral artery is of fetal type. The PCA branch vessels demonstrate mild distal irregularity without a significant proximal stenosis or occlusion. No aneurysms are present. Venous sinuses: The dural sinuses are patent. Straight sinus and deep cerebral veins are intact. Cortical veins are within normal limits. Anatomic variants: Fetal type right posterior cerebral artery. Delayed phase: The delayed postcontrast images accentuate the right basal ganglia infarct. White matter disease is again noted. No pathologic enhancement is present. Review of the MIP images confirms the above findings IMPRESSION: 1. No emergent large vessel occlusion. 2. Atherosclerotic changes at the aortic arch bilateral carotid bifurcations, and cavernous internal carotid arteries bilaterally without a significant stenosis in the anterior circulation. 3. High-grade stenosis the non dominant right vertebral artery origin. 4. Mild diffuse distal small vessel disease within the circle-of-Willis. 5. Mild degenerative changes of the cervical spine. 6. Dilated esophagus with some thickening. Question esophageal motility disorder. Electronically Signed   By: San Morelle [name]   On: 02/28/2018 12:25   Dg Chest 2 View  Result Date: 02/27/2018 CLINICAL DATA:  Generalized weakness for several days EXAM: CHEST - 2 VIEW COMPARISON:  02/12/2013 FINDINGS: Cardiac shadows within normal limits. Aortic  calcifications are noted. The lungs are well aerated bilaterally. No focal infiltrate is seen. No acute bony abnormality is noted. IMPRESSION: No active cardiopulmonary disease. Electronically Signed   By: Inez Catalina [name]   On: 02/27/2018 17:19   Ct Head Wo Contrast  Result Date: 02/27/2018 CLINICAL DATA:  Altered level of consciousness unexplained. EXAM: CT HEAD WITHOUT CONTRAST TECHNIQUE: Contiguous axial images were obtained from the base of the skull through the vertex without intravenous contrast. COMPARISON:  02/12/2013 FINDINGS: BRAIN: There is chronic mild sulcal and ventricular prominence consistent with superficial and central atrophy. Small 8 mm focus of hypodensity in the right posterior basal ganglion consistent with lacunar infarct is identified, new since 2014 but age-indeterminate otherwise. No intraparenchymal hemorrhage, mass effect nor midline shift. Mild periventricular and subcortical white matter hypodensities consistent with chronic small vessel ischemic disease are redemonstrated. No acute large vascular territory infarcts. No abnormal extra-axial fluid collections. Basal cisterns are not effaced and midline. The brainstem and cerebellum appear nonacute. VASCULAR: Moderate calcific atherosclerosis of the carotid siphons. SKULL: No skull fracture. No significant scalp soft tissue swelling. SINUSES/ORBITS: The mastoid air-cells are clear. The included paranasal sinuses are well-aerated.The included ocular globes and orbital contents are non-suspicious. OTHER: None. IMPRESSION: 1. Atrophy with chronic small vessel ischemic disease. 2. Small 8 mm focus of hypodensity in the right posterior basal ganglia consistent with lacunar infarct, new since 2014 but age-indeterminate otherwise. Electronically Signed   By: Ashley Royalty [name]   On: 02/27/2018 17:50  Ct Angio Neck W Or Wo Contrast  Result Date: 02/28/2018 CLINICAL DATA:  Right basal ganglia acute infarct. Ataxia. Weakness. EXAM: CT  ANGIOGRAPHY HEAD AND NECK TECHNIQUE: Multidetector CT imaging of the head and neck was performed using the standard protocol during bolus administration of intravenous contrast. Multiplanar CT image reconstructions and MIPs were obtained to evaluate the vascular anatomy. Carotid stenosis measurements (when applicable) are obtained utilizing NASCET criteria, using the distal internal carotid diameter as the denominator. CONTRAST:  74mL ISOVUE-370 IOPAMIDOL (ISOVUE-370) INJECTION 76% COMPARISON:  MRI brain 02/27/2018. FINDINGS: CTA NECK FINDINGS Aortic arch: A 3 vessel arch configuration is present. Atherosclerotic calcifications are present at the aortic arch without significant stenosis of the great vessel origins. Right carotid system: The right common carotid artery is within normal limits. Dense calcifications are present at the bifurcation. Minimal luminal diameter is 3 mm. There is no significant stenosis relative to the more distal vessel. The distal cervical left ICA right ICA is normal. Left carotid system: The left common carotid artery is tortuous without other significant atherosclerotic calcifications proximal to the bifurcation. Dense calcifications are present at the bifurcation. Minimal luminal diameter is 2.5 mm. This compares with a more distal measurement 4.6 mm. Vertebral arteries: The left vertebral artery is the dominant vessel. Focal calcification is present at the origin the non dominant right vertebral artery. There is no significant stenosis at the origin of the left vertebral artery from the subclavian. No additional stenoses are present in either vertebral artery in the neck. Skeleton: Degenerative disc disease is most evident at C5-6. Vertebral body heights are preserved. Patient is edentulous. No focal lytic or blastic lesions are present. Other neck: Soft tissues the neck are otherwise unremarkable. Salivary glands are within normal limits. No focal lytic or blastic lesions are present.  Upper chest: Lung apices are clear. Thoracic inlet is within normal limits. Esophagus is dilated. Review of the MIP images confirms the above findings CTA HEAD FINDINGS Anterior circulation: Atherosclerotic calcifications are present throughout the cavernous internal carotid arteries bilaterally without a significant luminal stenosis relative to the more distal vessels. The ICA termini are within normal limits bilaterally. The A1 and M1 segments are normal. The anterior communicating artery is patent. MCA bifurcations are intact. ACA and MCA branch vessels demonstrate mild distal small vessel disease without a significant proximal stenosis or occlusion. Posterior circulation: The left vertebral artery is the dominant vessel. Vertebrobasilar junction is normal. Basilar artery is small. The left posterior cerebral artery originates from the basilar tip. The right posterior cerebral artery is of fetal type. The PCA branch vessels demonstrate mild distal irregularity without a significant proximal stenosis or occlusion. No aneurysms are present. Venous sinuses: The dural sinuses are patent. Straight sinus and deep cerebral veins are intact. Cortical veins are within normal limits. Anatomic variants: Fetal type right posterior cerebral artery. Delayed phase: The delayed postcontrast images accentuate the right basal ganglia infarct. White matter disease is again noted. No pathologic enhancement is present. Review of the MIP images confirms the above findings IMPRESSION: 1. No emergent large vessel occlusion. 2. Atherosclerotic changes at the aortic arch bilateral carotid bifurcations, and cavernous internal carotid arteries bilaterally without a significant stenosis in the anterior circulation. 3. High-grade stenosis the non dominant right vertebral artery origin. 4. Mild diffuse distal small vessel disease within the circle-of-Willis. 5. Mild degenerative changes of the cervical spine. 6. Dilated esophagus with some  thickening. Question esophageal motility disorder. Electronically Signed   By: Wynetta Fines.D.  On: 02/28/2018 12:25   Mr Brain Wo Contrast  Result Date: 02/27/2018 CLINICAL DATA:  Ataxia, stroke suspected. Weakness for 4 days. Patient now requires a walker for 4 days. EXAM: MRI HEAD WITHOUT CONTRAST TECHNIQUE: Multiplanar, multiecho pulse sequences of the brain and surrounding structures were obtained without intravenous contrast. COMPARISON:  CT head without contrast 02/27/2018 and 02/12/13 FINDINGS: Brain: The diffusion-weighted images confirm an acute nonhemorrhagic infarct in the posterior right lentiform nucleus associated T2 signal changes are present. A remote posterior right frontal lobe infarct is noted more superiorly. Mild generalized atrophy and white matter disease is present otherwise. The ventricles are of proportionate to the degree of atrophy. No significant extraaxial fluid collection is present. Mild white matter changes extend into the brainstem. Cerebellum is within normal limits. Vascular: Flow is present in the major intracranial arteries. Skull and upper cervical spine: The craniocervical junction is normal. Upper cervical spine is within normal limits. Marrow signal is unremarkable. Sinuses/Orbits: The paranasal sinuses and mastoid air cells are clear. Bilateral lens replacements are noted. Globes and orbits are otherwise unremarkable. IMPRESSION: 1. Acute/subacute 11 mm nonhemorrhagic infarct in the posterior right lentiform nucleus. 2. Remote more superior posterior right frontal lobe infarct. 3. Mild generalized atrophy and white matter disease is otherwise within normal limits for age. Electronically Signed   By: San Morelle [name]   On: 02/27/2018 19:42   Mr Lumbar Spine Wo Contrast  Result Date: 02/27/2018 CLINICAL DATA:  Initial evaluation for acute bilateral lower extremity numbness, difficulty with ambulation. EXAM: MRI LUMBAR SPINE WITHOUT CONTRAST  TECHNIQUE: Multiplanar, multisequence MR imaging of the lumbar spine was performed. No intravenous contrast was administered. COMPARISON:  None available. FINDINGS: Segmentation: Normal segmentation. Lowest well-formed disc labeled the L5-S1 level. Alignment: Mild roto dextroscoliosis. Trace anterolisthesis of L2 on L3 and L3 on L4. Vertebrae: Vertebral body heights maintained without evidence for acute or chronic fracture. Bone marrow signal intensity within normal limits. Reactive endplate changes noted about the L4-5 interspace. 13 mm benign hemangioma noted within the L2 vertebral body. No other discrete or worrisome osseous lesions. No other abnormal marrow edema. Conus medullaris and cauda equina: Conus extends to the T12-L1 level. Conus and cauda equina appear normal. Paraspinal and other soft tissues: Paraspinous soft tissues demonstrate no acute finding. Chronic fatty atrophy noted within the paraspinous musculature posteriorly. Visualized visceral structures unremarkable. Disc levels: L1-2: Minimal annular disc bulge. Mild bilateral facet hypertrophy. Trace joint effusion on the right. No significant stenosis. L2-3: Trace anterolisthesis. Circumferential disc bulge with disc desiccation and intervertebral disc space narrowing. Moderate facet and ligament flavum hypertrophy. Trace bilateral joint effusions. Resultant borderline mild spinal stenosis. Mild bilateral L2 foraminal narrowing. L3-4: Trace anterolisthesis. Diffuse circumferential disc bulge with disc desiccation and intervertebral disc space narrowing. Superimposed broad-based left subarticular disc protrusion extends into the left lateral recess (series 8, image 17). Superimposed moderate facet and ligament flavum hypertrophy. Trace joint effusion on the right. Resultant moderate canal with left lateral recess stenosis. Mild to moderate left L3 foraminal narrowing. L4-5: Diffuse disc bulge with disc desiccation and intervertebral disc space  narrowing. Chronic reactive endplate changes with marginal endplate osteophytic spurring. Moderate facet and ligament flavum hypertrophy. Trace bilateral joint effusions. Resultant mild left lateral recess narrowing without significant spinal stenosis. Moderate left L4 foraminal narrowing. L5-S1: Mild disc bulge. Mild to moderate facet and ligament flavum hypertrophy. Trace bilateral joint effusions. Mild epidural lipomatosis. No significant spinal stenosis. Moderate bilateral L5 foraminal narrowing. IMPRESSION: 1. No acute abnormality within the lumbar  spine. No evidence for cord compression. 2. Multifactorial degenerative changes at L3-4 with resultant moderate canal and left lateral recess stenosis, with mild to moderate left L3 foraminal narrowing. Either the left L3 or descending L4 nerve roots could be affected. 3. Disc bulging with moderate facet hypertrophy at L4-5 with resultant mild left lateral recess stenosis, with moderate left L4 foraminal narrowing. 4. Disc bulging with facet hypertrophy at L5-S1 with resultant moderate bilateral L5 foraminal stenosis. Electronically Signed   By: Jeannine Boga [name]   On: 02/27/2018 18:55   Vas US Carotid (at [place] Only)  Result Date: 02/28/2018 Carotid Arterial Duplex Study Indications:  Weakness. Risk Factors: Hypertension, hyperlipidemia. Performing Technologist: Sharion Dove RVS  Examination Guidelines: A complete evaluation includes B-mode imaging, spectral Doppler, color Doppler, and power Doppler as needed of all accessible portions of each vessel. Bilateral testing is considered an integral part of a complete examination. Limited examinations for reoccurring indications may be performed as noted.  Right Carotid Findings: +----------+--------+--------+--------+------------+------------------+           PSV cm/sEDV cm/sStenosisDescribe    Comments           +----------+--------+--------+--------+------------+------------------+ CCA Prox   78      16                          intimal thickening +----------+--------+--------+--------+------------+------------------+ CCA Distal76      23                          intimal thickening +----------+--------+--------+--------+------------+------------------+ ICA Prox  79      19              heterogenous                   +----------+--------+--------+--------+------------+------------------+ ICA Mid   85      19                                             +----------+--------+--------+--------+------------+------------------+ ICA Distal109     29                                             +----------+--------+--------+--------+------------+------------------+ ECA       90      15                                             +----------+--------+--------+--------+------------+------------------+ +----------+--------+-------+--------+-------------------+           PSV cm/sEDV cmsDescribeArm Pressure (mmHG) +----------+--------+-------+--------+-------------------+ [id number]                                         +----------+--------+-------+--------+-------------------+ +---------+--------+--+--------+--+ VertebralPSV cm/s68EDV cm/s12 +---------+--------+--+--------+--+  Left Carotid Findings: +----------+--------+--------+--------+--------+------------------+           PSV cm/sEDV cm/sStenosisDescribeComments           +----------+--------+--------+--------+--------+------------------+ CCA Prox  122     24  intimal thickening +----------+--------+--------+--------+--------+------------------+ CCA Distal96      21                      intimal thickening +----------+--------+--------+--------+--------+------------------+ ICA Prox  130     24              calcificShadowing          +----------+--------+--------+--------+--------+------------------+ ICA Distal110     30                                          +----------+--------+--------+--------+--------+------------------+ ECA       179                                                +----------+--------+--------+--------+--------+------------------+ +----------+--------+--------+--------+-------------------+ SubclavianPSV cm/sEDV cm/sDescribeArm Pressure (mmHG) +----------+--------+--------+--------+-------------------+           111                                         +----------+--------+--------+--------+-------------------+ +---------+--------+--+--------+--+ VertebralPSV cm/s64EDV cm/s17 +---------+--------+--+--------+--+  Summary: Right Carotid: Velocities in the right ICA are consistent with a 1-39% stenosis. Left Carotid: Velocities in the left ICA are consistent with a 1-39% stenosis. Vertebrals:  Bilateral vertebral arteries demonstrate antegrade flow. Subclavians: Normal flow hemodynamics were seen in bilateral subclavian              arteries. *See table(s) above for measurements and observations.     Preliminary    Transthoracic Echocardiogram  02/28/2018 - Left ventricle: The cavity size was normal. There was mild   concentric hypertrophy. Systolic function was normal. The   estimated ejection fraction was in the range of 55% to 60%. Wall   motion was normal; there were no regional wall motion   abnormalities. Doppler parameters are consistent with abnormal   left ventricular relaxation (grade 1 diastolic dysfunction).   Doppler parameters are consistent with high ventricular filling   pressure. - Aortic valve: Transvalvular velocity was within the normal range.   There was no stenosis. There was no regurgitation. - Mitral valve: Mildly calcified annulus. Transvalvular velocity   was within the normal range. There was no evidence for stenosis.   There was trivial regurgitation. - Right ventricle: The cavity size was normal. Wall thickness was   normal. Systolic function was normal. - Atrial  septum: No defect or patent foramen ovale was identified. - Tricuspid valve: There was mild regurgitation. - Pulmonary arteries: Systolic pressure was within the normal   range. PA peak pressure: 31 mm Hg (S).   PHYSICAL EXAM  Temp:  [98.2 F (36.8 C)-98.8 F (37.1 C)] 98.8 F (37.1 C) (01/11 0715) Pulse Rate:  [61-74] 69 (01/11 0715) Resp:  [14-28] 19 (01/11 0715) BP: (116-169)/(54-86) 142/70 (01/11 0715) SpO2:  [96 %-100 %] 98 % (01/11 0715) Weight:  [71 kg-75.3 kg] 71 kg (01/10 2224)  General - Well nourished, well developed, in no apparent distress.  Ophthalmologic - fundi not visualized due to noncooperation.  Cardiovascular - Regular rate and rhythm.  Mental Status -  Level of arousal and orientation to time, place, and person were intact. Language including expression, naming, repetition, comprehension  was assessed and found intact. Fund of Knowledge was assessed and was intact.  Cranial Nerves II - XII - II - Visual field intact OU. III, IV, VI - Extraocular movements intact. V - Facial sensation intact bilaterally. VII - Facial movement intact bilaterally. VIII - Hearing & vestibular intact bilaterally. X - Palate elevates symmetrically. XI - Chin turning & shoulder shrug intact bilaterally. XII - Tongue protrusion intact.  Motor Strength - The patient's strength was symmetrical in all extremities, BUE 5/5 and BLE 4-/5 proximal and 4+/5 distally and pronator drift was absent.  Bulk was normal and fasciculations were absent.   Motor Tone - Muscle tone was assessed at the neck and appendages and was normal.  Reflexes - The patient's reflexes were symmetrical in all extremities and she had no pathological reflexes.  Sensory - Light touch, temperature/pinprick were assessed and were symmetrical.    Coordination - The patient had normal movements in the hands with no ataxia or dysmetria.  Tremor was absent.  Gait and Station - deferred.   ASSESSMENT/PLAN Ms.  DESIRAE MANCUSI is a 78 y.o. female with history of Htn, Hld, Renal Insufficiency, PMR, Leukopenia, COPD, and anemia requiring transfusions presenting with generalized weakness. She did not receive IV t-PA due to late presentation.  Stroke: Acute/subacute 11 mm nonhemorrhagic infarct in the posterior right lentiform nucleus - possibly small vessel disease  Resultant b/l leg mild weakness than normal  CT head - Small 8 mm focus of hypodensity in the right posterior basal ganglia consistent with lacunar infarct,  MRI head - Acute/subacute 11 mm nonhemorrhagic infarct in the posterior right lentiform nucleus.   CTA H&N - diffuse mild atherosclerosis.  Right nondominant VA origin stenosis  Carotid Doppler  - unremarkable  2D Echo EF 55 to 60%  LDL - 101  HgbA1c - 5.2  VTE prophylaxis - Lovenox  aspirin 81 mg daily prior to admission, now on aspirin 81 and Plavix 75.  Recommend to continue DAPT for 3 weeks and then Plavix alone.  Patient counseled to be compliant with her antithrombotic medications  Ongoing aggressive stroke risk factor management  Therapy recommendations:  HH PT - no OT recommended.  Disposition:  Pending  PMR  Was on prednisone  Present discontinued 1 year ago  Continue follow-up with PCP  Hypertension  Stable . Permissive hypertension (OK if < 220/120) but gradually normalize in 5-7 days . 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 Please call with questions. Pt will follow up with stroke clinic NP at [facility] in about 4 weeks. Thanks for the  consult.  Rosalin Hawking, MD PhD Stroke Neurology 02/28/2018 3:29 PM    To contact Stroke Continuity provider, please refer to [email or web address] After hours, contact General Neurology

## 2018-02-28 NOTE — Progress Notes (Signed)
VASCULAR LAB PRELIMINARY  PRELIMINARY  PRELIMINARY  PRELIMINARY  Carotid duplex completed.    Preliminary report:  1-39% ICA stenosis. Vertebral artery flow is antegrade.   Mercedes Valeriano, RVT 02/28/2018, 8:21 AM

## 2018-02-28 NOTE — Progress Notes (Signed)
SLP Cancellation Note  Patient Details Name: Nicole Meyer MRN: [id number] DOB: 1940/04/17   Cancelled treatment:        Patient's cognitive and linguistic skills are at baseline. She is able to communicate at the conversational level. Husband and daughter at bedside and confirm her skills to be at baseline. Will defer full evaluation at this time.    Charlynne Cousins Turhan Chill, MA, CCC-SLP 02/28/2018 11:25 AM

## 2018-02-28 NOTE — Consult Note (Addendum)
Neurology Consultation Reason for Consult: Stroke Referring Physician: Ward, J  CC: Difficulty walking  History is obtained from: Patient  HPI: Nicole Meyer is a 78 y.o. female who has noted symptoms since Tuesday.  She states that her legs "just will not work right."  She initially thought that she just had the flu, but given that she was continuing to have difficulty, she sought care in the emergency department last night.  She denies lateralizing symptoms, states that she just feels weak all over.  In the ER, an MRI was obtained which demonstrates a small subcortical infarct.  LKW: Tuesday tpa given?: no, outside of window   ROS: A 14 point ROS was performed and is negative except as noted in the HPI.  Unable to obtain due to altered mental status.   Past Medical History:  Diagnosis Date  . Anxiety   . Anxiety   . Arthritis    "knees and back" (02/12/2013)  . Arthritis   . Asthma    no problems recently  . Blood dyscrasia    sperocytosis  . Cancer ([place]) 04-06-12   skin cancer nasal bridge-no problems now  . Chronic kidney disease (CKD), stage III (moderate) (HCC)   . COPD (chronic obstructive pulmonary disease) ([place])   . Depression   . Depression   . Diverticulitis   . Fatigue   . GERD (gastroesophageal reflux disease)   . Gout 04/05/2013  . H/O hiatal hernia   . History of blood transfusion    "2 units in 12/2012; suppose to get 1 unit today" (02/12/2013)  . HTN (hypertension)   . Hyperbilirubinemia 05/03/2013  . Hypercalcemia   . Hypercholesteremia    "above borderline; I don't take RX for it" (12/26/2011)  . IBS (irritable bowel syndrome)   . IBS (irritable bowel syndrome)    ibs  . Iron deficiency anemia    [facility]  . Leukocytopenia   . Lumbar spinal stenosis   . Lymphopenia 08/23/2011  . Osteoarthritis   . Osteoporosis   . PMR (polymyalgia rheumatica) (HCC)    pmr  . Polymyalgia ([place])   . Renal insufficiency   .  Syncope and collapse 12/24/2011   "loss of consciousness for 3-4 min" (12/26/2011)  . UTI (urinary tract infection) 12/26/2011     Family History  Problem Relation Age of Onset  . Diabetes Father   . Heart disease Father   . Breast cancer Maternal Aunt        in 36's     Social History:  reports that she quit smoking about 18 years ago. Her smoking use included cigarettes. She has a 40.00 pack-year smoking history. She has never used smokeless tobacco. She reports that she does not drink alcohol or use drugs.   Exam: Current vital signs: BP 139/66 (BP Location: Left Arm)   Pulse 67   Temp 98.7 F (37.1 C) (Oral)   Resp 18   Ht 5' 1.5" (1.562 m)   Wt 71 kg   SpO2 97%   BMI 29.10 kg/m  Vital signs in last 24 hours: Temp:  [98.2 F (36.8 C)-98.7 F (37.1 C)] 98.7 F (37.1 C) (01/11 0010) Pulse Rate:  [61-73] 67 (01/11 0010) Resp:  [14-28] 18 (01/11 0010) BP: (116-169)/(54-86) 139/66 (01/11 0010) SpO2:  [97 %-100 %] 97 % (01/11 0010) Weight:  [71 kg-75.3 kg] 71 kg (01/10 2224)   Physical Exam  Constitutional: Appears well-developed and well-nourished.  Psych: Affect appropriate to  situation Eyes: No scleral injection HENT: No OP obstrucion Head: Normocephalic.  Cardiovascular: Normal rate and regular rhythm.  Respiratory: Effort normal, non-labored breathing GI: Soft.  No distension. There is no tenderness.  Skin: WDI  Neuro: Mental Status: Patient is awake, alert, oriented to person, place, month, year, and situation. Patient is able to give a clear and coherent history. No signs of aphasia or neglect Cranial Nerves: II: Visual Fields are full. Pupils are equal, round, and reactive to light.   III,IV, VI: EOMI without ptosis or diploplia.  V: Facial sensation is symmetric to temperature VII: Facial movement is symmetric.  VIII: hearing is intact to voice X: Uvula elevates symmetrically XI: Shoulder shrug is symmetric. XII: tongue is midline without atrophy  or fasciculations.  Motor: Tone is normal. Bulk is normal.  She gives poor effort in bilateral lower extremities, 4/5 in the right and 4-/5 in the left.  She has 5/5 strength in bilateral upper extremities Sensory: Sensation is symmetric to light touch and temperature in the arms and legs. Cerebellar: She has mild intentional tremor bilaterally     I have reviewed labs in epic and the results pertinent to this consultation are: Creatinine 1.52  I have reviewed the images obtained: MRI brain- lacunar stroke in the right  Impression: 78 year old female with small subcortical stroke, likely secondary to small vessel disease.  She will need to be assessed for secondary risk factor modification.  Recommendations: - HgbA1c, fasting lipid panel -  MRA  of the brain without contrast - Frequent neuro checks - Echocardiogram - Carotid dopplers - Prophylactic therapy-Antiplatelet med: Aspirin - dose 325mg  PO or 300mg  PR - Risk factor modification - Telemetry monitoring - PT consult, OT consult, Speech consult - Stroke team to follow  Roland Rack, MD [facility] [phone]  If 7pm- 7am, please page neurology on call as listed in [place].

## 2018-02-28 NOTE — Evaluation (Signed)
Physical Therapy Evaluation Patient Details Name: Nicole Meyer MRN: [id number] DOB: 11-02-1940 Today's Date: 02/28/2018   History of Present Illness  Pt is a 78 y/o female admitted secondary to bilateral LE weakness and difficulty walking. 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 PMH including but not limited to anxiety, arthritis, asthma, CKD stage III, COPD, depression, GERD, hypertension, hyperlipidemia and R TKA in 2017.    Clinical Impression  Pt presented supine in bed with HOB elevated, awake and willing to participate in therapy session. Prior to admission, pt reported that she was independent with all functional mobility and ADLs. Pt lives with her spouse in a single level home with a ramped entrance. Pt had previously been managing her husband's PICC line from his recent surgery with infection. Pt's daughter can assist intermittently. Pt currently at min guard to supervision level for functional mobility with use of RW. Pt anxious throughout and fearful of falling; however, did not demonstrate any instability or require any physical assistance. Pt would continue to benefit from skilled physical therapy services at this time while admitted and after d/c to address the below listed limitations in order to improve overall safety and independence with functional mobility.     Follow Up Recommendations Home health PT    Equipment Recommendations  None recommended by PT    Recommendations for Other Services       Precautions / Restrictions Precautions Precautions: Fall Restrictions Weight Bearing Restrictions: No      Mobility  Bed Mobility Overal bed mobility: Needs Assistance Bed Mobility: Supine to Sit     Supine to sit: Supervision     General bed mobility comments: increased time and effort, supervision for safety  Transfers Overall transfer level: Needs assistance Equipment used: Rolling walker (2  wheeled) Transfers: Sit to/from Stand Sit to Stand: Min guard         General transfer comment: good technique utilized, min guard for safety to stand from EOB  Ambulation/Gait Ambulation/Gait assistance: Min Gaffer (Feet): 50 Feet Assistive device: Rolling walker (2 wheeled) Gait Pattern/deviations: Step-through pattern;Decreased stride length Gait velocity: decreased   General Gait Details: pt generally steady with use of RW, occasional cueing to maintain body within from at RW, min guard initially progressing to supervision  Stairs            Wheelchair Mobility    Modified Rankin (Stroke Patients Only) Modified Rankin (Stroke Patients Only) Pre-Morbid Rankin Score: No symptoms Modified Rankin: Moderate disability     Balance Overall balance assessment: Needs assistance Sitting-balance support: Feet supported Sitting balance-Leahy Scale: Good     Standing balance support: Single extremity supported;Bilateral upper extremity supported;No upper extremity supported Standing balance-Leahy Scale: Poor                               Pertinent Vitals/Pain Pain Assessment: Faces Faces Pain Scale: Hurts little more Pain Location: bilateral LEs Pain Descriptors / Indicators: Sore Pain Intervention(s): Monitored during session;Repositioned    Home Living Family/patient expects to be discharged to:: Private residence Living Arrangements: Spouse/significant other Available Help at Discharge: Family;Available PRN/intermittently Type of Home: House Home Access: Ramped entrance     Home Layout: One level Home Equipment: Tub bench;Walker - 2 wheels;Cane - single point      Prior Function Level of Independence: Independent         Comments: has been taking care  of her husband and managing his PICC line at home     Hand Dominance        Extremity/Trunk Assessment   Upper Extremity Assessment Upper Extremity  Assessment: Defer to OT evaluation    Lower Extremity Assessment Lower Extremity Assessment: Generalized weakness       Communication   Communication: No difficulties  Cognition Arousal/Alertness: Awake/alert Behavior During Therapy: Anxious Overall Cognitive Status: No family/caregiver present to determine baseline cognitive functioning Area of Impairment: Safety/judgement;Awareness;Problem solving                         Safety/Judgement: Decreased awareness of deficits Awareness: Emergent Problem Solving: Decreased initiation;Difficulty sequencing;Requires verbal cues        General Comments      Exercises     Assessment/Plan    PT Assessment Patient needs continued PT services  PT Problem List Decreased strength;Decreased balance;Decreased mobility;Decreased coordination;Decreased cognition;Decreased knowledge of use of DME;Decreased safety awareness;Decreased knowledge of precautions       PT Treatment Interventions DME instruction;Gait training;Stair training;Therapeutic exercise;Functional mobility training;Therapeutic activities;Balance training;Neuromuscular re-education;Cognitive remediation;Patient/family education    PT Goals (Current goals can be found in the Care Plan section)  Acute Rehab PT Goals Patient Stated Goal: to not fall PT Goal Formulation: With patient Time For Goal Achievement: 03/14/18 Potential to Achieve Goals: Good    Frequency Min 3X/week   Barriers to discharge        Co-evaluation               AM-PAC PT "6 Clicks" Mobility  Outcome Measure Help needed turning from your back to your side while in a flat bed without using bedrails?: None Help needed moving from lying on your back to sitting on the side of a flat bed without using bedrails?: None Help needed moving to and from a bed to a chair (including a wheelchair)?: None Help needed standing up from a chair using your arms (e.g., wheelchair or bedside  chair)?: None Help needed to walk in hospital room?: None Help needed climbing 3-5 steps with a railing? : A Little 6 Click Score: 23    End of Session Equipment Utilized During Treatment: Gait belt Activity Tolerance: Patient tolerated treatment well Patient left: with call bell/phone within reach;Other (comment)(seated on toilet with OT present) Nurse Communication: Mobility status PT Visit Diagnosis: Other abnormalities of gait and mobility (R26.89);Other symptoms and signs involving the nervous system (M22.633)    Time: 3545-6256 PT Time Calculation (min) (ACUTE ONLY): 20 min   Charges:   PT Evaluation $PT Eval Moderate Complexity: 1 Mod          Sherie Don, PT, DPT  Acute Rehabilitation Services Pager [phone] Office [place] 02/28/2018, 11:36 AM

## 2018-02-28 NOTE — Plan of Care (Signed)
  Problem: Education: Goal: Knowledge of General Education information will improve Description Including pain rating scale, medication(s)/side effects and non-pharmacologic comfort measures Outcome: Progressing Note:  Reviewed with pt. about vital sign, safety and neuro checks; waiting on orders.

## 2018-02-28 NOTE — Progress Notes (Signed)
Pt. Transported from ER via stretcher to 3W-18; alert and oriented x4; husband and daughter with pt.; pt. oriented to room and call button. Paged [facility] admissions that new admit is here- need orders.

## 2018-02-28 NOTE — H&P (Signed)
History and Physical    Nicole Meyer VVO:[id number] DOB: 1941-02-12 DOA: 02/27/2018  PCP: [name], MD Patient coming from: Home  Chief Complaint: Bilateral lower extremity weakness  HPI: Nicole Meyer is a 78 y.o. female with medical history significant of anxiety, arthritis, asthma, CKD stage III, COPD, depression, GERD, hypertension, hyperlipidemia, IBS presenting to the hospital for evaluation of bilateral lower extremity weakness.  Patient reports having bilateral lower extremity pain, weakness, and numbness for the past 4 days.  States she does have chronic bilateral knee pain and has a history of knee replacement.  States it has been difficult for her to walk and she has been using her walker lately.  Denies having any focal weakness.  Denies any history of stroke.  States she has an appointment with a vein doctor in February to get a test to check for poor circulation in her legs.  Denies having any dysuria, urinary frequency, or urgency.  Reports having chills but no fevers.  Reports having decreased p.o. intake for the past few days.  Denies having any nausea, vomiting, diarrhea, or abdominal pain.  ED Course: On exam, noted to have a wide-based unsteady gait.  Decreased pulses in bilateral lower extremities which were dopplerable. Found to have an acute/subacute 11 mm nonhemorrhagic infarct on brain MRI.  UA suggestive of infection.  Review of Systems: As per HPI otherwise 10 point review of systems negative.  Past Medical History:  Diagnosis Date  . Anxiety   . Anxiety   . Arthritis    "knees and back" (02/12/2013)  . Arthritis   . Asthma    no problems recently  . Blood dyscrasia    sperocytosis  . Cancer ([place]) 04-06-12   skin cancer nasal bridge-no problems now  . Chronic kidney disease (CKD), stage III (moderate) (HCC)   . COPD (chronic obstructive pulmonary disease) ([place])   . Depression   . Depression   . Diverticulitis   . Fatigue   . GERD  (gastroesophageal reflux disease)   . Gout 04/05/2013  . H/O hiatal hernia   . History of blood transfusion    "2 units in 12/2012; suppose to get 1 unit today" (02/12/2013)  . HTN (hypertension)   . Hyperbilirubinemia 05/03/2013  . Hypercalcemia   . Hypercholesteremia    "above borderline; I don't take RX for it" (12/26/2011)  . IBS (irritable bowel syndrome)   . IBS (irritable bowel syndrome)    ibs  . Iron deficiency anemia    [facility]  . Leukocytopenia   . Lumbar spinal stenosis   . Lymphopenia 08/23/2011  . Osteoarthritis   . Osteoporosis   . PMR (polymyalgia rheumatica) (HCC)    pmr  . Polymyalgia ([place])   . Renal insufficiency   . Syncope and collapse 12/24/2011   "loss of consciousness for 3-4 min" (12/26/2011)  . UTI (urinary tract infection) 12/26/2011    Past Surgical History:  Procedure Laterality Date  . APPENDECTOMY  80  . arthroscopic knee surgery-left    . BREAST EXCISIONAL BIOPSY Right 1995  . BREAST SURGERY  04-06-12   rt. lumpectomy_benign  . carpal tunnel right    . COLONOSCOPY, ESOPHAGOGASTRODUODENOSCOPY (EGD) AND ESOPHAGEAL DILATION     06/2013  . ESOPHAGOGASTRODUODENOSCOPY  12/26/2011   Procedure: ESOPHAGOGASTRODUODENOSCOPY (EGD);  Surgeon: Inda Castle, MD;  Location: [place];  Service: Endoscopy;  Laterality: N/A;  . EYE SURGERY     cataracts  . HERNIA REPAIR  1970's   "  stomach"   . LAPAROSCOPIC NISSEN FUNDOPLICATION N/A 2/84/1324   Procedure: Laparoscopic Reduction and Repair of Paraesophagel Hiatal Hernia with Nissen;  Surgeon: Adin Hector, MD;  Location: [facility];  Service: General;  Laterality: N/A;  Laparoscopic Reduction and Repair of Paraesophagel Hiatal Hernia with Nissen  . NISSEN FUNDOPLICATION     05/100  . TOTAL KNEE ARTHROPLASTY Right 05/19/2015  . TOTAL KNEE ARTHROPLASTY Right 05/19/2015   Procedure: RIGHT TOTAL KNEE ARTHROPLASTY;  Surgeon: Netta Cedars, MD;  Location: [place];  Service: Orthopedics;   Laterality: Right;  . TUBAL LIGATION  1970's  . VAGINAL HYSTERECTOMY  ~ 1980   right ovary     reports that she quit smoking about 18 years ago. Her smoking use included cigarettes. She has a 40.00 pack-year smoking history. She has never used smokeless tobacco. She reports that she does not drink alcohol or use drugs.  Allergies  Allergen Reactions  . Penicillins Hives    Last reaction was at 34 Has patient had a PCN reaction causing immediate rash, facial/tongue/throat swelling, SOB or lightheadedness with hypotension: Yes Has patient had a PCN reaction causing severe rash involving mucus membranes or skin necrosis: No Has patient had a PCN reaction that required hospitalization No Has patient had a PCN reaction occurring within the last 10 years: No If all of the above answers are "NO", then may proceed with Cephalosporin use.   Garlon Hatchet [Arformoterol] Other (See Comments)    "makes me nervous"  . Paxil [Paroxetine Hcl] Rash    Family History  Problem Relation Age of Onset  . Diabetes Father   . Heart disease Father   . Breast cancer Maternal Aunt        in 50's    Prior to Admission medications   Medication Sig Start Date End Date Taking? Authorizing Provider  albuterol (PROVENTIL HFA;VENTOLIN HFA) 108 (90 Base) MCG/ACT inhaler Inhale 2 puffs into the lungs every 6 (six) hours as needed for wheezing or shortness of breath.   Yes [provider]  Alum Hydroxide-Mag Carbonate 160-105 MG CHEW Chew 1 tablet by mouth daily after breakfast.   Yes [provider]  aspirin EC 81 MG tablet Take 81 mg by mouth daily after breakfast.    Yes [provider]  Calcium Carb-Cholecalciferol (CALCIUM 600 + D PO) Take 2 tablets by mouth daily after breakfast.   Yes [provider]  celecoxib (CELEBREX) 100 MG capsule Take 100 mg by mouth 2 (two) times daily.   Yes [provider]  cyanocobalamin 500 MCG tablet Take 500 mcg by mouth daily after  breakfast. Vitamin B12   Yes [provider]  folic acid (FOLVITE) 725 MCG tablet Take 400 mcg by mouth daily after breakfast.   Yes [provider]  lisinopril (PRINIVIL) 10 MG tablet Take 1 tablet (10 mg total) by mouth daily. Patient taking differently: Take 10 mg by mouth daily after breakfast.  02/15/13  Yes Darrick Meigs, Marge Duncans, MD  Misc Natural Products (ESTROVEN ENERGY PO) Take 1 tablet by mouth daily.    Yes [provider]  Multiple Vitamins-Minerals (OCUVITE PO) Take 1 tablet by mouth daily after breakfast.   Yes [provider]  naproxen sodium (ALEVE) 220 MG tablet Take 660 mg by mouth daily as needed (mild pain).   Yes [provider]  omeprazole (PRILOSEC) 20 MG capsule Take 1 capsule (20 mg total) by mouth daily. 07/21/15  Yes Harvel Quale, MD  predniSONE (DELTASONE) 5  MG tablet Take 1 tablet (5 mg total) by mouth daily after breakfast. 08/23/16  Yes Heath Lark, MD    Physical Exam: Vitals:   02/27/18 2320 02/28/18 0010 02/28/18 0238 02/28/18 0434  BP: (!) 157/69 139/66 (!) 151/65 (!) 135/56  Pulse: 63 67 74 73  Resp: 17 18 18 20   Temp: 98.3 F (36.8 C) 98.7 F (37.1 C) 98.8 F (37.1 C) 98.5 F (36.9 C)  TempSrc: Oral Oral Oral Oral  SpO2: 100% 97% 98% 96%  Weight:      Height:        Physical Exam  Constitutional: She is oriented to person, place, and time. She appears well-developed and well-nourished. No distress.  HENT:  Head: Normocephalic.  Mouth/Throat: Oropharynx is clear and moist.  Eyes: Pupils are equal, round, and reactive to light. EOM are normal.  Neck: Neck supple.  Cardiovascular: Normal rate, regular rhythm and intact distal pulses.  Pulmonary/Chest: Effort normal and breath sounds normal. No respiratory distress. She has no wheezes. She has no rales.  Abdominal: Soft. Bowel sounds are normal. She exhibits no distension. There is no abdominal tenderness. There is no guarding.  Musculoskeletal:         General: No edema.  Neurological: She is alert and oriented to person, place, and time. No cranial nerve deficit.  Speech fluent, tongue midline, no facial droop Strength 5 out of 5 in bilateral upper and lower extremities.  Sensation to light touch intact throughout.  Skin: Skin is warm and dry. She is not diaphoretic.  Psychiatric: She has a normal mood and affect. Her behavior is normal.     Labs on Admission: I have personally reviewed following labs and imaging studies  CBC: Recent Labs  Lab 02/27/18 1545  WBC 5.6  NEUTROABS 4.4  HGB 11.0*  HCT 34.6*  MCV 94.3  PLT 449   Basic Metabolic Panel: Recent Labs  Lab 02/27/18 1545  NA 137  K 4.3  CL 106  CO2 21*  GLUCOSE 112*  BUN 29*  CREATININE 1.52*  CALCIUM 9.7   GFR: Estimated Creatinine Clearance: 27.8 mL/min (A) (by C-G formula based on SCr of 1.52 mg/dL (H)). Liver Function Tests: Recent Labs  Lab 02/27/18 1545  AST 19  ALT 14  ALKPHOS 72  BILITOT 1.8*  PROT 7.2  ALBUMIN 3.4*   No results for input(s): LIPASE, AMYLASE in the last 168 hours. No results for input(s): AMMONIA in the last 168 hours. Coagulation Profile: No results for input(s): INR, PROTIME in the last 168 hours. Cardiac Enzymes: No results for input(s): CKTOTAL, CKMB, CKMBINDEX, TROPONINI in the last 168 hours. BNP (last 3 results) No results for input(s): PROBNP in the last 8760 hours. HbA1C: No results for input(s): HGBA1C in the last 72 hours. CBG: No results for input(s): GLUCAP in the last 168 hours. Lipid Profile: No results for input(s): CHOL, HDL, LDLCALC, TRIG, CHOLHDL, LDLDIRECT in the last 72 hours. Thyroid Function Tests: No results for input(s): TSH, T4TOTAL, FREET4, T3FREE, THYROIDAB in the last 72 hours. Anemia Panel: No results for input(s): VITAMINB12, FOLATE, FERRITIN, TIBC, IRON, RETICCTPCT in the last 72 hours. Urine analysis:    Component Value Date/Time   COLORURINE YELLOW 02/27/2018 1920   APPEARANCEUR  HAZY (A) 02/27/2018 1920   LABSPEC 1.015 02/27/2018 1920   PHURINE 5.0 02/27/2018 1920   GLUCOSEU NEGATIVE 02/27/2018 1920   HGBUR SMALL (A) 02/27/2018 1920   BILIRUBINUR NEGATIVE 02/27/2018 [place] NEGATIVE 02/27/2018 1920   PROTEINUR  30 (A) 02/27/2018 1920   UROBILINOGEN 0.2 02/12/2013 0350   NITRITE NEGATIVE 02/27/2018 1920   LEUKOCYTESUR MODERATE (A) 02/27/2018 1920    Radiological Exams on Admission: Dg Chest 2 View  Result Date: 02/27/2018 CLINICAL DATA:  Generalized weakness for several days EXAM: CHEST - 2 VIEW COMPARISON:  02/12/2013 FINDINGS: Cardiac shadows within normal limits. Aortic calcifications are noted. The lungs are well aerated bilaterally. No focal infiltrate is seen. No acute bony abnormality is noted. IMPRESSION: No active cardiopulmonary disease. Electronically Signed   By: Inez Catalina [name]   On: 02/27/2018 17:19   Ct Head Wo Contrast  Result Date: 02/27/2018 CLINICAL DATA:  Altered level of consciousness unexplained. EXAM: CT HEAD WITHOUT CONTRAST TECHNIQUE: Contiguous axial images were obtained from the base of the skull through the vertex without intravenous contrast. COMPARISON:  02/12/2013 FINDINGS: BRAIN: There is chronic mild sulcal and ventricular prominence consistent with superficial and central atrophy. Small 8 mm focus of hypodensity in the right posterior basal ganglion consistent with lacunar infarct is identified, new since 2014 but age-indeterminate otherwise. No intraparenchymal hemorrhage, mass effect nor midline shift. Mild periventricular and subcortical white matter hypodensities consistent with chronic small vessel ischemic disease are redemonstrated. No acute large vascular territory infarcts. No abnormal extra-axial fluid collections. Basal cisterns are not effaced and midline. The brainstem and cerebellum appear nonacute. VASCULAR: Moderate calcific atherosclerosis of the carotid siphons. SKULL: No skull fracture. No significant scalp  soft tissue swelling. SINUSES/ORBITS: The mastoid air-cells are clear. The included paranasal sinuses are well-aerated.The included ocular globes and orbital contents are non-suspicious. OTHER: None. IMPRESSION: 1. Atrophy with chronic small vessel ischemic disease. 2. Small 8 mm focus of hypodensity in the right posterior basal ganglia consistent with lacunar infarct, new since 2014 but age-indeterminate otherwise. Electronically Signed   By: Ashley Royalty [name]   On: 02/27/2018 17:50   Mr Brain Wo Contrast  Result Date: 02/27/2018 CLINICAL DATA:  Ataxia, stroke suspected. Weakness for 4 days. Patient now requires a walker for 4 days. EXAM: MRI HEAD WITHOUT CONTRAST TECHNIQUE: Multiplanar, multiecho pulse sequences of the brain and surrounding structures were obtained without intravenous contrast. COMPARISON:  CT head without contrast 02/27/2018 and 02/12/13 FINDINGS: Brain: The diffusion-weighted images confirm an acute nonhemorrhagic infarct in the posterior right lentiform nucleus associated T2 signal changes are present. A remote posterior right frontal lobe infarct is noted more superiorly. Mild generalized atrophy and white matter disease is present otherwise. The ventricles are of proportionate to the degree of atrophy. No significant extraaxial fluid collection is present. Mild white matter changes extend into the brainstem. Cerebellum is within normal limits. Vascular: Flow is present in the major intracranial arteries. Skull and upper cervical spine: The craniocervical junction is normal. Upper cervical spine is within normal limits. Marrow signal is unremarkable. Sinuses/Orbits: The paranasal sinuses and mastoid air cells are clear. Bilateral lens replacements are noted. Globes and orbits are otherwise unremarkable. IMPRESSION: 1. Acute/subacute 11 mm nonhemorrhagic infarct in the posterior right lentiform nucleus. 2. Remote more superior posterior right frontal lobe infarct. 3. Mild generalized atrophy  and white matter disease is otherwise within normal limits for age. Electronically Signed   By: San Morelle [name]   On: 02/27/2018 19:42   Mr Lumbar Spine Wo Contrast  Result Date: 02/27/2018 CLINICAL DATA:  Initial evaluation for acute bilateral lower extremity numbness, difficulty with ambulation. EXAM: MRI LUMBAR SPINE WITHOUT CONTRAST TECHNIQUE: Multiplanar, multisequence MR imaging of the lumbar spine was performed. No intravenous contrast was administered.  COMPARISON:  None available. FINDINGS: Segmentation: Normal segmentation. Lowest well-formed disc labeled the L5-S1 level. Alignment: Mild roto dextroscoliosis. Trace anterolisthesis of L2 on L3 and L3 on L4. Vertebrae: Vertebral body heights maintained without evidence for acute or chronic fracture. Bone marrow signal intensity within normal limits. Reactive endplate changes noted about the L4-5 interspace. 13 mm benign hemangioma noted within the L2 vertebral body. No other discrete or worrisome osseous lesions. No other abnormal marrow edema. Conus medullaris and cauda equina: Conus extends to the T12-L1 level. Conus and cauda equina appear normal. Paraspinal and other soft tissues: Paraspinous soft tissues demonstrate no acute finding. Chronic fatty atrophy noted within the paraspinous musculature posteriorly. Visualized visceral structures unremarkable. Disc levels: L1-2: Minimal annular disc bulge. Mild bilateral facet hypertrophy. Trace joint effusion on the right. No significant stenosis. L2-3: Trace anterolisthesis. Circumferential disc bulge with disc desiccation and intervertebral disc space narrowing. Moderate facet and ligament flavum hypertrophy. Trace bilateral joint effusions. Resultant borderline mild spinal stenosis. Mild bilateral L2 foraminal narrowing. L3-4: Trace anterolisthesis. Diffuse circumferential disc bulge with disc desiccation and intervertebral disc space narrowing. Superimposed broad-based left subarticular disc  protrusion extends into the left lateral recess (series 8, image 17). Superimposed moderate facet and ligament flavum hypertrophy. Trace joint effusion on the right. Resultant moderate canal with left lateral recess stenosis. Mild to moderate left L3 foraminal narrowing. L4-5: Diffuse disc bulge with disc desiccation and intervertebral disc space narrowing. Chronic reactive endplate changes with marginal endplate osteophytic spurring. Moderate facet and ligament flavum hypertrophy. Trace bilateral joint effusions. Resultant mild left lateral recess narrowing without significant spinal stenosis. Moderate left L4 foraminal narrowing. L5-S1: Mild disc bulge. Mild to moderate facet and ligament flavum hypertrophy. Trace bilateral joint effusions. Mild epidural lipomatosis. No significant spinal stenosis. Moderate bilateral L5 foraminal narrowing. IMPRESSION: 1. No acute abnormality within the lumbar spine. No evidence for cord compression. 2. Multifactorial degenerative changes at L3-4 with resultant moderate canal and left lateral recess stenosis, with mild to moderate left L3 foraminal narrowing. Either the left L3 or descending L4 nerve roots could be affected. 3. Disc bulging with moderate facet hypertrophy at L4-5 with resultant mild left lateral recess stenosis, with moderate left L4 foraminal narrowing. 4. Disc bulging with facet hypertrophy at L5-S1 with resultant moderate bilateral L5 foraminal stenosis. Electronically Signed   By: Jeannine Boga [name]   On: 02/27/2018 18:55    EKG: Independently reviewed.  Sinus rhythm (heart rate 71).  Assessment/Plan Principal Problem:   Acute CVA (cerebrovascular accident) ([place]) Active Problems:   HTN (hypertension)   Asthma   Chronic kidney disease (CKD), stage III (moderate) (HCC)   UTI (urinary tract infection)   AKI (acute kidney injury) (HCC)   Serum total bilirubin elevated   PAD (peripheral artery disease) (HCC)   Lumbar stenosis   Chronic  anemia   Acute CVA Presenting with a 4-day history of bilateral lower extremity weakness.  Wide-based unsteady gait noted in the ED.  MRI showing acute/subacute 11 mm nonhemorrhagic infarct in the posterior right lentiform nucleus.  In addition, there is a remote more superior posterior right frontal lobe infarct.  Patient was seen by Dr. Leonel Ramsay from neurology. -Monitor on telemetry -A1c, lipid panel -Frequent neurochecks -Echocardiogram -Carotid Dopplers -Aspirin 325 mg daily -Risk factor modification -PT/OT/SLP -Neurology following; appreciate recs  ?UTI Afebrile and no leukocytosis.  Patient is not endorsing any UTI symptoms.  UA showing moderate amount of leukocytes, 6-10 WBCs/ HPF, many bacteria, and negative nitrite. -Continue ceftriaxone -Urine culture  pending  AKI on CKD 3 BUN 29.  Creatinine 1.5, was 1.1 nine months ago. -IV fluid hydration -Avoid nephrotoxic agents/contrast -Repeat BMP in a.m.  Hyperbilirubinemia T bili elevated at 1.8, remainder of LFTs normal.  T bili was normal on labs done 9 months ago. -Right upper quadrant ultrasound  Peripheral arterial disease Bilateral lower extremity pulses dopplerable per ED provider.  Patient has an appointment with vascular surgery in February for ABIs.  -Outpatient follow-up with vascular surgery  History of lumbar stenosis MRI without acute abnormalities, no evidence of cord compression.  She does have multilevel degenerative changes and spinal stenosis.  -Outpatient follow-up with neurosurgery -Tylenol PRN pain  Chronic anemia -Hemoglobin 11.0, stable.  Baseline in the 11.1-11.5.  GERD -Continue PPI  Asthma, COPD -Stable.  No bronchospasm.  Continue albuterol as needed.  Hypertension Currently normotensive. -Hold home lisinopril in the setting of AKI  DVT prophylaxis: Lovenox (renally dosed) Code Status: Patient wishes to be full code. Family Communication: No family available. Disposition Plan:  Anticipate discharge in 1 to 2 days. Consults called: None Admission status: Observation, telemetry   Shela Leff MD [facility] Pager [phone]  If 7PM-7AM, please contact night-coverage [email or web address] Password [email or web address]  02/28/2018, 5:33 AM

## 2018-02-28 NOTE — Evaluation (Signed)
Occupational Therapy Evaluation Patient Details Name: Nicole Meyer MRN: [id number] DOB: 1940/04/09 Today's Date: 02/28/2018    History of Present Illness Pt is a 78 y/o female admitted secondary to bilateral LE weakness and difficulty walking. 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 PMH including but not limited to anxiety, arthritis, asthma, CKD stage III, COPD, depression, GERD, hypertension, hyperlipidemia and R TKA in 2017.   Clinical Impression   Pt admitted with above. She demonstrates the below listed deficits and will benefit from continued OT to maximize safety and independence with BADLs.  Pt presents to OT with LE weakness, decreased activity tolerance, impaired balance.  Pt currently requires min guard assist with ADLs and functional mobility.  She is very anxious about moving and ruminates a bit on deficits.  PTA, she was fully independent with ADLs.  Anticipate good progress.  Will follow acutely.       Follow Up Recommendations  No OT follow up;Supervision - Intermittent    Equipment Recommendations  None recommended by OT    Recommendations for Other Services       Precautions / Restrictions Precautions Precautions: Fall Restrictions Weight Bearing Restrictions: No      Mobility Bed Mobility Overal bed mobility: Needs Assistance Bed Mobility: Supine to Sit     Supine to sit: Supervision     General bed mobility comments: Pt up with PT   Transfers Overall transfer level: Needs assistance Equipment used: Rolling walker (2 wheeled) Transfers: Sit to/from [facility] Sit to Stand: Min guard Stand pivot transfers: Min guard       General transfer comment: min guard for balance and safety     Balance Overall balance assessment: Needs assistance Sitting-balance support: Feet supported Sitting balance-Leahy Scale: Good     Standing balance support: Single extremity  supported;During functional activity Standing balance-Leahy Scale: Fair Standing balance comment: Pt able to maintain static standing with min guard assist                            ADL either performed or assessed with clinical judgement   ADL Overall ADL's : Needs assistance/impaired Eating/Feeding: Independent   Grooming: Wash/dry hands;Brushing hair;Min guard;Standing   Upper Body Bathing: Set up;Sitting   Lower Body Bathing: Min guard;Sit to/from stand   Upper Body Dressing : Set up;Sitting   Lower Body Dressing: Min guard;Sit to/from stand   Toilet Transfer: Min guard;Ambulation;Comfort height toilet;RW;Grab bars   Toileting- Clothing Manipulation and Hygiene: Min guard;Sit to/from stand       Functional mobility during ADLs: Min guard;Rolling walker General ADL Comments: assist for balance      Vision Baseline Vision/History: Wears glasses Wears Glasses: At all times Patient Visual Report: No change from baseline Vision Assessment?: No apparent visual deficits     Perception Perception Perception Tested?: Yes   Praxis Praxis Praxis tested?: Within functional limits    Pertinent Vitals/Pain Pain Assessment: Faces Faces Pain Scale: Hurts little more Pain Location: bilateral LEs Pain Descriptors / Indicators: Sore Pain Intervention(s): Monitored during session;Repositioned     Hand Dominance Right   Extremity/Trunk Assessment Upper Extremity Assessment Upper Extremity Assessment: Overall WFL for tasks assessed   Lower Extremity Assessment Lower Extremity Assessment: Defer to PT evaluation   Cervical / Trunk Assessment Cervical / Trunk Assessment: Normal   Communication Communication Communication: No difficulties   Cognition Arousal/Alertness: Awake/alert Behavior During Therapy: Anxious Overall Cognitive Status:  No family/caregiver present to determine baseline cognitive functioning Area of Impairment:  Safety/judgement;Awareness;Problem solving                         Safety/Judgement: Decreased awareness of deficits Awareness: Emergent Problem Solving: Decreased initiation;Difficulty sequencing;Requires verbal cues General Comments: Pt very anxious re: current deficits and tends to ruminate on current deficits and issues, and requires cues to redirect to task at hand.  By end of session, pt initating better and reports she feels more confident in her abilities    General Comments       Exercises     Shoulder Instructions      Home Living Family/patient expects to be discharged to:: Private residence Living Arrangements: Spouse/significant other Available Help at Discharge: Family;Available PRN/intermittently Type of Home: House Home Access: Ramped entrance     Home Layout: One level     Bathroom Shower/Tub: Teacher, early years/pre: Standard     Home Equipment: Tub bench;Walker - 2 wheels;Cane - single point          Prior Functioning/Environment Level of Independence: Independent        Comments: has been taking care of her husband and managing his PICC line at home        OT Problem List: Decreased strength;Decreased activity tolerance;Impaired balance (sitting and/or standing);Decreased knowledge of use of DME or AE      OT Treatment/Interventions: Self-care/ADL training;Neuromuscular education;DME and/or AE instruction;Therapeutic activities;Patient/family education;Balance training    OT Goals(Current goals can be found in the care plan section) Acute Rehab OT Goals Patient Stated Goal: to get back home to take care of her cats  OT Goal Formulation: With patient Time For Goal Achievement: 03/14/18 Potential to Achieve Goals: Good ADL Goals Pt Will Perform Grooming: with modified independence;standing Pt Will Perform Upper Body Bathing: with set-up;sitting Pt Will Perform Lower Body Bathing: with modified independence;sit to/from  stand Pt Will Perform Upper Body Dressing: with set-up;sitting Pt Will Perform Lower Body Dressing: with modified independence;sit to/from stand Pt Will Transfer to Toilet: with modified independence;ambulating;regular height toilet;bedside commode;grab bars Pt Will Perform Toileting - Clothing Manipulation and hygiene: with modified independence;sit to/from stand Pt Will Perform Tub/Shower Transfer: Tub transfer;with supervision;ambulating;tub bench;rolling walker  OT Frequency: Min 2X/week   Barriers to D/C: Decreased caregiver support          Co-evaluation              AM-PAC OT "6 Clicks" Daily Activity     Outcome Measure Help from another person eating meals?: None Help from another person taking care of personal grooming?: A Little Help from another person toileting, which includes using toliet, bedpan, or urinal?: A Little Help from another person bathing (including washing, rinsing, drying)?: A Little Help from another person to put on and taking off regular upper body clothing?: A Little Help from another person to put on and taking off regular lower body clothing?: A Little 6 Click Score: 19   End of Session Equipment Utilized During Treatment: Gait belt;Rolling walker Nurse Communication: Mobility status  Activity Tolerance: Patient tolerated treatment well Patient left: in chair;with call bell/phone within reach;with chair alarm set  OT Visit Diagnosis: Unsteadiness on feet (R26.81)                Time: 4696-2952 OT Time Calculation (min): 13 min Charges:  OT General Charges $OT Visit: 1 Visit OT Evaluation $OT Eval Moderate Complexity: 1 Mod  Lucille Passy, OTR/L Acute Rehabilitation Services Pager [phone] Office [phone]   Lucille Passy M 02/28/2018, 12:01 PM

## 2018-02-28 NOTE — Progress Notes (Signed)
Paged [facility] admissions again- orders need for pt.

## 2018-03-01 ENCOUNTER — Encounter (HOSPITAL_COMMUNITY): Payer: Self-pay

## 2018-03-01 DIAGNOSIS — N179 Acute kidney failure, unspecified: Secondary | ICD-10-CM | POA: Diagnosis not present

## 2018-03-01 DIAGNOSIS — I10 Essential (primary) hypertension: Secondary | ICD-10-CM | POA: Diagnosis not present

## 2018-03-01 DIAGNOSIS — M353 Polymyalgia rheumatica: Secondary | ICD-10-CM

## 2018-03-01 DIAGNOSIS — D649 Anemia, unspecified: Secondary | ICD-10-CM | POA: Diagnosis not present

## 2018-03-01 DIAGNOSIS — I639 Cerebral infarction, unspecified: Secondary | ICD-10-CM | POA: Diagnosis not present

## 2018-03-01 DIAGNOSIS — N189 Chronic kidney disease, unspecified: Secondary | ICD-10-CM

## 2018-03-01 LAB — BASIC METABOLIC PANEL
Anion gap: 9 (ref 5–15)
BUN: 23 mg/dL (ref 8–23)
CO2: 18 mmol/L — ABNORMAL LOW (ref 22–32)
Calcium: 9.3 mg/dL (ref 8.9–10.3)
Chloride: 108 mmol/L (ref 98–111)
Creatinine, Ser: 1.61 mg/dL — ABNORMAL HIGH (ref 0.44–1.00)
GFR calc Af Amer: 35 mL/min — ABNORMAL LOW (ref >60)
GFR calc non Af Amer: 30 mL/min — ABNORMAL LOW (ref >60)
Glucose, Bld: 182 mg/dL — ABNORMAL HIGH (ref 70–99)
POTASSIUM: 3.9 mmol/L (ref 3.5–5.1)
Sodium: 135 mmol/L (ref 135–145)

## 2018-03-01 LAB — SEDIMENTATION RATE: Sed Rate: 86 mm/hr — ABNORMAL HIGH (ref 0–22)

## 2018-03-01 MED ORDER — SODIUM CHLORIDE 0.9 % IV BOLUS: 500.0000 mL | Freq: Once | INTRAVENOUS | Status: AC

## 2018-03-01 MED ORDER — CEPHALEXIN 250 MG PO CAPS
250.0000 mg | ORAL_CAPSULE | Freq: Three times a day (TID) | ORAL | Status: DC
  Administered 2018-03-01 – 2018-03-02 (×2): 250 mg via ORAL
  Filled 2018-03-01 (×2): qty 1

## 2018-03-01 MED ORDER — CLOPIDOGREL BISULFATE 75 MG PO TABS: 75.0000 mg | ORAL_TABLET | Freq: Every day | ORAL | 0 refills | Status: DC

## 2018-03-01 MED ORDER — CEPHALEXIN 500 MG PO CAPS: 500.0000 mg | ORAL_CAPSULE | Freq: Four times a day (QID) | ORAL | Status: DC

## 2018-03-01 MED ORDER — PREDNISONE 5 MG PO TABS
10.0000 mg | ORAL_TABLET | ORAL | Status: AC
  Administered 2018-03-01: 10 mg via ORAL
  Filled 2018-03-01: qty 2

## 2018-03-01 MED ORDER — PREDNISONE 5 MG PO TABS: 5.0000 mg | ORAL_TABLET | Freq: Every day | ORAL | 0 refills | Status: DC

## 2018-03-01 MED ORDER — ATORVASTATIN CALCIUM 40 MG PO TABS: 40.0000 mg | ORAL_TABLET | Freq: Every day | ORAL | 0 refills | Status: DC

## 2018-03-01 NOTE — Care Management Note (Signed)
Case Management Note  Patient Details  Name: AHNIKA HANNIBAL MRN: [id number] Date of Birth: 1940-10-27  Subjective/Objective:                    Action/Plan:  Spoke w patient and spouse at bedside. They specifically asked for [facility]. Orders will need to faxed to [phone] per field nurse, verified they are accepting patients w [facility] Medicare. Requested rollator be delivered to room prior to DC by [facility].   Expected Discharge Date:                  Expected Discharge Plan:     In-House Referral:     Discharge planning Services  CM Consult  Post Acute Care Choice:  Home Health, Durable Medical Equipment Choice offered to:  Patient  DME Arranged:  Walker rolling with seat DME Agency:  [place]:  PT [place] [facility]  Status of Service:  In process, will continue to follow  If discussed at Long Length of Stay Meetings, dates discussed:    Additional Comments:  Carles Collet, RN 03/01/2018, 11:12 AM

## 2018-03-01 NOTE — Progress Notes (Signed)
Dr Tamala Julian made aware of pt's IV leaking and pt refusal to have replaced, pt expected to go home tomorrow.

## 2018-03-01 NOTE — Progress Notes (Signed)
   Patient was admitted after midnight.  Have seen and agree with overall assessment and plan.  Patient reports that she still thinks that she had the flu.  Will check RSV panel, start atorvastatin 40 mg.  Recommended to continue double antiplatelet therapy for 3 weeks then just placed Plavix alone. Home health physical therapy recommended.  Patient with history of PMR, but not on prednisone in the last year.  Check ESR in a.m.  Urine culture still pending and will continue with IV Rocephin for now.  Possible discharge home in a.m.

## 2018-03-02 DIAGNOSIS — I639 Cerebral infarction, unspecified: Secondary | ICD-10-CM | POA: Diagnosis not present

## 2018-03-02 DIAGNOSIS — D649 Anemia, unspecified: Secondary | ICD-10-CM | POA: Diagnosis not present

## 2018-03-02 DIAGNOSIS — N183 Chronic kidney disease, stage 3 (moderate): Secondary | ICD-10-CM | POA: Diagnosis not present

## 2018-03-02 DIAGNOSIS — I10 Essential (primary) hypertension: Secondary | ICD-10-CM | POA: Diagnosis not present

## 2018-03-02 LAB — CBC
HCT: 28.6 % — ABNORMAL LOW (ref 36.0–46.0)
Hemoglobin: 9.6 g/dL — ABNORMAL LOW (ref 12.0–15.0)
MCH: 31.2 pg (ref 26.0–34.0)
MCHC: 33.6 g/dL (ref 30.0–36.0)
MCV: 92.9 fL (ref 80.0–100.0)
Platelets: 217 10*3/uL (ref 150–400)
RBC: 3.08 MIL/uL — ABNORMAL LOW (ref 3.87–5.11)
RDW: 12.6 % (ref 11.5–15.5)
WBC: 4.9 10*3/uL (ref 4.0–10.5)
nRBC: 0 % (ref 0.0–0.2)

## 2018-03-02 LAB — URINE CULTURE: Culture: >=100000 — AB

## 2018-03-02 LAB — BASIC METABOLIC PANEL
Anion gap: 8 (ref 5–15)
BUN: 21 mg/dL (ref 8–23)
CO2: 21 mmol/L — ABNORMAL LOW (ref 22–32)
Calcium: 9 mg/dL (ref 8.9–10.3)
Chloride: 110 mmol/L (ref 98–111)
Creatinine, Ser: 1.32 mg/dL — ABNORMAL HIGH (ref 0.44–1.00)
GFR calc Af Amer: 45 mL/min — ABNORMAL LOW (ref >60)
GFR calc non Af Amer: 39 mL/min — ABNORMAL LOW (ref >60)
Glucose, Bld: 100 mg/dL — ABNORMAL HIGH (ref 70–99)
Potassium: 3.9 mmol/L (ref 3.5–5.1)
Sodium: 139 mmol/L (ref 135–145)

## 2018-03-02 MED ORDER — GABAPENTIN 600 MG PO TABS: 300.0000 mg | ORAL_TABLET | Freq: Two times a day (BID) | ORAL | 0 refills | Status: DC

## 2018-03-02 MED ORDER — FOSFOMYCIN TROMETHAMINE 3 G PO PACK
3.0000 g | PACK | Freq: Once | ORAL | Status: AC
  Administered 2018-03-02: 3 g via ORAL
  Filled 2018-03-02: qty 3

## 2018-03-02 MED ORDER — ASPIRIN EC 81 MG PO TBEC: 81.0000 mg | DELAYED_RELEASE_TABLET | Freq: Every day | ORAL | 0 refills | Status: AC

## 2018-03-02 MED ORDER — PREDNISONE 5 MG PO TABS
5.0000 mg | ORAL_TABLET | Freq: Every day | ORAL | Status: DC
  Administered 2018-03-02: 5 mg via ORAL
  Filled 2018-03-02: qty 1

## 2018-03-02 NOTE — Progress Notes (Signed)
Reviewed AVS discharge instructions with patient/caregiver. Patient/caregiver verbalizes understanding of instructions received. AVS and prescriptions received by patient/caregiver. If present, telemetry box removed and central cardiac monitoring department notified of discharge. Peripheral IV removed, site benign with tip intact. Patient awaiting daughter to come for pick up to go home. Daughter gets off work around 4-4:30 PM.

## 2018-03-02 NOTE — Discharge Summary (Signed)
Physician Discharge Summary  Nicole Meyer:[id number] DOB: Mar 18, 1940 DOA: 02/27/2018  PCP: [name], MD  Admit date: 02/27/2018 Discharge date: 03/02/2018  Time spent: 45 minutes  Recommendations for Outpatient Follow-up:  1. Follow up  With PCP 1-2 weeks for evaluation of BP control as lisinopril stopped, kidney function and Hg as well as serum glucose 2. Follow up with neurology as scheduled 3. HH PT   Discharge Diagnoses:  Principal Problem:   Acute CVA (cerebrovascular accident) ([place]) Active Problems:   HTN (hypertension)   Chronic kidney disease (CKD), stage III (moderate) ([facility])   AKI (acute kidney injury) ([place])   UTI (urinary tract infection)   Chronic anemia   Asthma   Serum total bilirubin elevated   PAD (peripheral artery disease) ([place])   Lumbar stenosis   Discharge Condition: stable  Diet recommendation: heart healthy  Filed Weights   02/27/18 1543 02/27/18 2224  Weight: 75.3 kg 71 kg    History of present illness:  Nicole Meyer is an 78 y.o. female with past medical history significant for anxiety, pleasant affect CKD 3, COPD, depression, GERD, HTN, HLD, and IBS; who presented with lower extremity weakness. MRI revealed an acute/subacute 67m non-hemorrhagic infarct.  Hospital Course:  CVA: Patient presented as a code stroke with bilateral leg weakness. MRI showing acute/subacute 11 mm nonhemorrhagic infarct of the posterior right lentiform nucleus. Neurology recommending Plavix and aspirin for 3 weeks, and then just continuing on Plavix. Physical therapy evaluated recommending home with physical therapy. - Maximizing  home health. Rollator brought to bedside - Outpatient follow-up with neurology  Urinary tract infection with hematuria: Acute. Urinalysis was positive for moderate leukocytes and many bacteria. Culture ESBL. Patient notes complaints of frequency. Received 2 days of rocephin. Provided with fosfomycin 3g.  Acute kidney  injury superimposed on chronic kidney disease stage II: Baseline creatinine previously noted to be around 1.18, but presented at admission with creatinine of 1.61 with BUN 23. Provided with IV fluids. At discharge creatinie trending down 1.3. recommend OP follow up. Holding nephrotoxins I.e. lisinopril, naprosyn, celebrex.   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 Question of leg pain could be related checkedESR and noted to be 86.  Patient is followed by her primary care provider for this. - Prednisone 10 mg, and then 5 mg daily -discharged on 573mdaily  Hyperglycemia: Blood glucose elevated up to 182.  Patient currently not on steroids and hemoglobin A1c 5.2. OP follow up with PCP   Procedures:    Consultations:    Discharge Exam: Vitals:   03/02/18 0404 03/02/18 0756  BP: (!) 149/53 (!) 147/78  Pulse: 64 63  Resp: 18 18  Temp: 98.2 F (36.8 C) 98.1 F (36.7 C)  SpO2: 97% 97%    General: awake alert no acute distress Cardiovascular: rrr no LE edema no mgr Respiratory: normal effort BS clear bilaterally no wheeze  Discharge Instructions   Discharge Instructions    Call MD for:  persistant dizziness or light-headedness   Complete by:  As directed    Diet - low sodium heart healthy   Complete by:  As directed    Diet - low sodium heart healthy   Complete by:  As directed    Increase activity slowly   Complete by:  As directed    Increase activity slowly   Complete by:  As directed      Allergies as of 03/02/2018  Reactions   Penicillins Hives   Last reaction was at 34 Has patient had a PCN reaction causing immediate rash, facial/tongue/throat swelling, SOB or lightheadedness with hypotension: Yes Has patient had a PCN reaction causing severe rash involving mucus membranes or skin necrosis: No Has patient had a PCN reaction that required hospitalization No Has patient had a PCN reaction occurring within  the last 10 years: No If all of the above answers are "NO", then may proceed with Cephalosporin use.   Brovana [arformoterol] Other (See Comments)   "makes me nervous"   Paxil [paroxetine Hcl] Rash      Medication List    STOP taking these medications   celecoxib 100 MG capsule Commonly known as:  CELEBREX   lisinopril 10 MG tablet Commonly known as:  PRINIVIL   naproxen sodium 220 MG tablet Commonly known as:  ALEVE     TAKE these medications   albuterol 108 (90 Base) MCG/ACT inhaler Commonly known as:  PROVENTIL HFA;VENTOLIN HFA Inhale 2 puffs into the lungs every 6 (six) hours as needed for wheezing or shortness of breath.   Alum Hydroxide-Mag Carbonate 160-105 MG Chew Chew 1 tablet by mouth daily after breakfast.   aspirin EC 81 MG tablet Take 1 tablet (81 mg total) by mouth daily after breakfast for 21 days.   atorvastatin 40 MG tablet Commonly known as:  LIPITOR Take 1 tablet (40 mg total) by mouth daily at 6 PM.   CALCIUM 600 + D PO Take 2 tablets by mouth daily after breakfast.   clopidogrel 75 MG tablet Commonly known as:  PLAVIX Take 1 tablet (75 mg total) by mouth daily.   ESTROVEN ENERGY PO Take 1 tablet by mouth daily.   folic acid 003 MCG tablet Commonly known as:  FOLVITE Take 400 mcg by mouth daily after breakfast.   gabapentin 600 MG tablet Commonly known as:  NEURONTIN Take 0.5 tablets (300 mg total) by mouth 2 (two) times daily.   OCUVITE PO Take 1 tablet by mouth daily after breakfast.   omeprazole 20 MG capsule Commonly known as:  PRILOSEC Take 1 capsule (20 mg total) by mouth daily.   predniSONE 5 MG tablet Commonly known as:  DELTASONE Take 1 tablet (5 mg total) by mouth daily after breakfast.   vitamin B-12 500 MCG tablet Commonly known as:  CYANOCOBALAMIN Take 500 mcg by mouth daily after breakfast. Vitamin B12            Durable Medical Equipment  (From admission, onward)         Start     Ordered   03/01/18 1115   For home use only DME 4 wheeled rolling walker with seat  (Walkers)  Once    Question Answer Comment  Patient needs a walker to treat with the following condition Gait disturbance   Patient needs a walker to treat with the following condition Gait disturbance, post-stroke      03/01/18 1115   03/01/18 1105  For home use only DME 4 wheeled rolling walker with seat  Once    Question:  Patient needs a walker to treat with the following condition  Answer:  Weakness   03/01/18 1105         Allergies  Allergen Reactions  . Penicillins Hives    Last reaction was at 35 Has patient had a PCN reaction causing immediate rash, facial/tongue/throat swelling, SOB or lightheadedness with hypotension: Yes Has patient had a PCN reaction causing severe rash involving  mucus membranes or skin necrosis: No Has patient had a PCN reaction that required hospitalization No Has patient had a PCN reaction occurring within the last 10 years: No If all of the above answers are "NO", then may proceed with Cephalosporin use.   Garlon Hatchet [Arformoterol] Other (See Comments)    "makes me nervous"  . Paxil [Paroxetine Hcl] Rash   Follow-up Information    [name], MD Follow up in 1 week(s).   Specialty:  Family Medicine Why:  These call on Monday and try to make appointment for this week.  Have repeat BMP and CBC obtained. Contact information: [address], [address] [place] [address] [facility].   Specialty:  [place] Why:  They will call you Monday to set up your first home visit Contact information: [address] [place] [address] [phone]            The results of significant diagnostics from this hospitalization (including imaging, microbiology, ancillary and laboratory) are listed below for reference.    Significant Diagnostic Studies: Ct Angio Head W Or Wo Contrast  Result Date: 02/28/2018 CLINICAL  DATA:  Right basal ganglia acute infarct. Ataxia. Weakness. EXAM: CT ANGIOGRAPHY HEAD AND NECK TECHNIQUE: Multidetector CT imaging of the head and neck was performed using the standard protocol during bolus administration of intravenous contrast. Multiplanar CT image reconstructions and MIPs were obtained to evaluate the vascular anatomy. Carotid stenosis measurements (when applicable) are obtained utilizing NASCET criteria, using the distal internal carotid diameter as the denominator. CONTRAST:  49m ISOVUE-370 IOPAMIDOL (ISOVUE-370) INJECTION 76% COMPARISON:  MRI brain 02/27/2018. FINDINGS: CTA NECK FINDINGS Aortic arch: A 3 vessel arch configuration is present. Atherosclerotic calcifications are present at the aortic arch without significant stenosis of the great vessel origins. Right carotid system: The right common carotid artery is within normal limits. Dense calcifications are present at the bifurcation. Minimal luminal diameter is 3 mm. There is no significant stenosis relative to the more distal vessel. The distal cervical left ICA right ICA is normal. Left carotid system: The left common carotid artery is tortuous without other significant atherosclerotic calcifications proximal to the bifurcation. Dense calcifications are present at the bifurcation. Minimal luminal diameter is 2.5 mm. This compares with a more distal measurement 4.6 mm. Vertebral arteries: The left vertebral artery is the dominant vessel. Focal calcification is present at the origin the non dominant right vertebral artery. There is no significant stenosis at the origin of the left vertebral artery from the subclavian. No additional stenoses are present in either vertebral artery in the neck. Skeleton: Degenerative disc disease is most evident at C5-6. Vertebral body heights are preserved. Patient is edentulous. No focal lytic or blastic lesions are present. Other neck: Soft tissues the neck are otherwise unremarkable. Salivary glands are  within normal limits. No focal lytic or blastic lesions are present. Upper chest: Lung apices are clear. Thoracic inlet is within normal limits. Esophagus is dilated. Review of the MIP images confirms the above findings CTA HEAD FINDINGS Anterior circulation: Atherosclerotic calcifications are present throughout the cavernous internal carotid arteries bilaterally without a significant luminal stenosis relative to the more distal vessels. The ICA termini are within normal limits bilaterally. The A1 and M1 segments are normal. The anterior communicating artery is patent. MCA bifurcations are intact. ACA and MCA branch vessels demonstrate mild distal small vessel disease without a significant proximal stenosis or occlusion.  Posterior circulation: The left vertebral artery is the dominant vessel. Vertebrobasilar junction is normal. Basilar artery is small. The left posterior cerebral artery originates from the basilar tip. The right posterior cerebral artery is of fetal type. The PCA branch vessels demonstrate mild distal irregularity without a significant proximal stenosis or occlusion. No aneurysms are present. Venous sinuses: The dural sinuses are patent. Straight sinus and deep cerebral veins are intact. Cortical veins are within normal limits. Anatomic variants: Fetal type right posterior cerebral artery. Delayed phase: The delayed postcontrast images accentuate the right basal ganglia infarct. White matter disease is again noted. No pathologic enhancement is present. Review of the MIP images confirms the above findings IMPRESSION: 1. No emergent large vessel occlusion. 2. Atherosclerotic changes at the aortic arch bilateral carotid bifurcations, and cavernous internal carotid arteries bilaterally without a significant stenosis in the anterior circulation. 3. High-grade stenosis the non dominant right vertebral artery origin. 4. Mild diffuse distal small vessel disease within the circle-of-Willis. 5. Mild  degenerative changes of the cervical spine. 6. Dilated esophagus with some thickening. Question esophageal motility disorder. Electronically Signed   By: San Morelle [name]   On: 02/28/2018 12:25   Dg Chest 2 View  Result Date: 02/27/2018 CLINICAL DATA:  Generalized weakness for several days EXAM: CHEST - 2 VIEW COMPARISON:  02/12/2013 FINDINGS: Cardiac shadows within normal limits. Aortic calcifications are noted. The lungs are well aerated bilaterally. No focal infiltrate is seen. No acute bony abnormality is noted. IMPRESSION: No active cardiopulmonary disease. Electronically Signed   By: Inez Catalina [name]   On: 02/27/2018 17:19   Ct Head Wo Contrast  Result Date: 02/27/2018 CLINICAL DATA:  Altered level of consciousness unexplained. EXAM: CT HEAD WITHOUT CONTRAST TECHNIQUE: Contiguous axial images were obtained from the base of the skull through the vertex without intravenous contrast. COMPARISON:  02/12/2013 FINDINGS: BRAIN: There is chronic mild sulcal and ventricular prominence consistent with superficial and central atrophy. Small 8 mm focus of hypodensity in the right posterior basal ganglion consistent with lacunar infarct is identified, new since 2014 but age-indeterminate otherwise. No intraparenchymal hemorrhage, mass effect nor midline shift. Mild periventricular and subcortical white matter hypodensities consistent with chronic small vessel ischemic disease are redemonstrated. No acute large vascular territory infarcts. No abnormal extra-axial fluid collections. Basal cisterns are not effaced and midline. The brainstem and cerebellum appear nonacute. VASCULAR: Moderate calcific atherosclerosis of the carotid siphons. SKULL: No skull fracture. No significant scalp soft tissue swelling. SINUSES/ORBITS: The mastoid air-cells are clear. The included paranasal sinuses are well-aerated.The included ocular globes and orbital contents are non-suspicious. OTHER: None. IMPRESSION: 1. Atrophy with  chronic small vessel ischemic disease. 2. Small 8 mm focus of hypodensity in the right posterior basal ganglia consistent with lacunar infarct, new since 2014 but age-indeterminate otherwise. Electronically Signed   By: Ashley Royalty [name]   On: 02/27/2018 17:50   Ct Angio Neck W Or Wo Contrast  Result Date: 02/28/2018 CLINICAL DATA:  Right basal ganglia acute infarct. Ataxia. Weakness. EXAM: CT ANGIOGRAPHY HEAD AND NECK TECHNIQUE: Multidetector CT imaging of the head and neck was performed using the standard protocol during bolus administration of intravenous contrast. Multiplanar CT image reconstructions and MIPs were obtained to evaluate the vascular anatomy. Carotid stenosis measurements (when applicable) are obtained utilizing NASCET criteria, using the distal internal carotid diameter as the denominator. CONTRAST:  34m ISOVUE-370 IOPAMIDOL (ISOVUE-370) INJECTION 76% COMPARISON:  MRI brain 02/27/2018. FINDINGS: CTA NECK FINDINGS Aortic arch: A 3 vessel arch configuration is present.  Atherosclerotic calcifications are present at the aortic arch without significant stenosis of the great vessel origins. Right carotid system: The right common carotid artery is within normal limits. Dense calcifications are present at the bifurcation. Minimal luminal diameter is 3 mm. There is no significant stenosis relative to the more distal vessel. The distal cervical left ICA right ICA is normal. Left carotid system: The left common carotid artery is tortuous without other significant atherosclerotic calcifications proximal to the bifurcation. Dense calcifications are present at the bifurcation. Minimal luminal diameter is 2.5 mm. This compares with a more distal measurement 4.6 mm. Vertebral arteries: The left vertebral artery is the dominant vessel. Focal calcification is present at the origin the non dominant right vertebral artery. There is no significant stenosis at the origin of the left vertebral artery from the  subclavian. No additional stenoses are present in either vertebral artery in the neck. Skeleton: Degenerative disc disease is most evident at C5-6. Vertebral body heights are preserved. Patient is edentulous. No focal lytic or blastic lesions are present. Other neck: Soft tissues the neck are otherwise unremarkable. Salivary glands are within normal limits. No focal lytic or blastic lesions are present. Upper chest: Lung apices are clear. Thoracic inlet is within normal limits. Esophagus is dilated. Review of the MIP images confirms the above findings CTA HEAD FINDINGS Anterior circulation: Atherosclerotic calcifications are present throughout the cavernous internal carotid arteries bilaterally without a significant luminal stenosis relative to the more distal vessels. The ICA termini are within normal limits bilaterally. The A1 and M1 segments are normal. The anterior communicating artery is patent. MCA bifurcations are intact. ACA and MCA branch vessels demonstrate mild distal small vessel disease without a significant proximal stenosis or occlusion. Posterior circulation: The left vertebral artery is the dominant vessel. Vertebrobasilar junction is normal. Basilar artery is small. The left posterior cerebral artery originates from the basilar tip. The right posterior cerebral artery is of fetal type. The PCA branch vessels demonstrate mild distal irregularity without a significant proximal stenosis or occlusion. No aneurysms are present. Venous sinuses: The dural sinuses are patent. Straight sinus and deep cerebral veins are intact. Cortical veins are within normal limits. Anatomic variants: Fetal type right posterior cerebral artery. Delayed phase: The delayed postcontrast images accentuate the right basal ganglia infarct. White matter disease is again noted. No pathologic enhancement is present. Review of the MIP images confirms the above findings IMPRESSION: 1. No emergent large vessel occlusion. 2.  Atherosclerotic changes at the aortic arch bilateral carotid bifurcations, and cavernous internal carotid arteries bilaterally without a significant stenosis in the anterior circulation. 3. High-grade stenosis the non dominant right vertebral artery origin. 4. Mild diffuse distal small vessel disease within the circle-of-Willis. 5. Mild degenerative changes of the cervical spine. 6. Dilated esophagus with some thickening. Question esophageal motility disorder. Electronically Signed   By: San Morelle [name]   On: 02/28/2018 12:25   Mr Brain Wo Contrast  Result Date: 02/27/2018 CLINICAL DATA:  Ataxia, stroke suspected. Weakness for 4 days. Patient now requires a walker for 4 days. EXAM: MRI HEAD WITHOUT CONTRAST TECHNIQUE: Multiplanar, multiecho pulse sequences of the brain and surrounding structures were obtained without intravenous contrast. COMPARISON:  CT head without contrast 02/27/2018 and 02/12/13 FINDINGS: Brain: The diffusion-weighted images confirm an acute nonhemorrhagic infarct in the posterior right lentiform nucleus associated T2 signal changes are present. A remote posterior right frontal lobe infarct is noted more superiorly. Mild generalized atrophy and white matter disease is present otherwise. The ventricles  are of proportionate to the degree of atrophy. No significant extraaxial fluid collection is present. Mild white matter changes extend into the brainstem. Cerebellum is within normal limits. Vascular: Flow is present in the major intracranial arteries. Skull and upper cervical spine: The craniocervical junction is normal. Upper cervical spine is within normal limits. Marrow signal is unremarkable. Sinuses/Orbits: The paranasal sinuses and mastoid air cells are clear. Bilateral lens replacements are noted. Globes and orbits are otherwise unremarkable. IMPRESSION: 1. Acute/subacute 11 mm nonhemorrhagic infarct in the posterior right lentiform nucleus. 2. Remote more superior posterior  right frontal lobe infarct. 3. Mild generalized atrophy and white matter disease is otherwise within normal limits for age. Electronically Signed   By: San Morelle [name]   On: 02/27/2018 19:42   Mr Lumbar Spine Wo Contrast  Result Date: 02/27/2018 CLINICAL DATA:  Initial evaluation for acute bilateral lower extremity numbness, difficulty with ambulation. EXAM: MRI LUMBAR SPINE WITHOUT CONTRAST TECHNIQUE: Multiplanar, multisequence MR imaging of the lumbar spine was performed. No intravenous contrast was administered. COMPARISON:  None available. FINDINGS: Segmentation: Normal segmentation. Lowest well-formed disc labeled the L5-S1 level. Alignment: Mild roto dextroscoliosis. Trace anterolisthesis of L2 on L3 and L3 on L4. Vertebrae: Vertebral body heights maintained without evidence for acute or chronic fracture. Bone marrow signal intensity within normal limits. Reactive endplate changes noted about the L4-5 interspace. 13 mm benign hemangioma noted within the L2 vertebral body. No other discrete or worrisome osseous lesions. No other abnormal marrow edema. Conus medullaris and cauda equina: Conus extends to the T12-L1 level. Conus and cauda equina appear normal. Paraspinal and other soft tissues: Paraspinous soft tissues demonstrate no acute finding. Chronic fatty atrophy noted within the paraspinous musculature posteriorly. Visualized visceral structures unremarkable. Disc levels: L1-2: Minimal annular disc bulge. Mild bilateral facet hypertrophy. Trace joint effusion on the right. No significant stenosis. L2-3: Trace anterolisthesis. Circumferential disc bulge with disc desiccation and intervertebral disc space narrowing. Moderate facet and ligament flavum hypertrophy. Trace bilateral joint effusions. Resultant borderline mild spinal stenosis. Mild bilateral L2 foraminal narrowing. L3-4: Trace anterolisthesis. Diffuse circumferential disc bulge with disc desiccation and intervertebral disc space  narrowing. Superimposed broad-based left subarticular disc protrusion extends into the left lateral recess (series 8, image 17). Superimposed moderate facet and ligament flavum hypertrophy. Trace joint effusion on the right. Resultant moderate canal with left lateral recess stenosis. Mild to moderate left L3 foraminal narrowing. L4-5: Diffuse disc bulge with disc desiccation and intervertebral disc space narrowing. Chronic reactive endplate changes with marginal endplate osteophytic spurring. Moderate facet and ligament flavum hypertrophy. Trace bilateral joint effusions. Resultant mild left lateral recess narrowing without significant spinal stenosis. Moderate left L4 foraminal narrowing. L5-S1: Mild disc bulge. Mild to moderate facet and ligament flavum hypertrophy. Trace bilateral joint effusions. Mild epidural lipomatosis. No significant spinal stenosis. Moderate bilateral L5 foraminal narrowing. IMPRESSION: 1. No acute abnormality within the lumbar spine. No evidence for cord compression. 2. Multifactorial degenerative changes at L3-4 with resultant moderate canal and left lateral recess stenosis, with mild to moderate left L3 foraminal narrowing. Either the left L3 or descending L4 nerve roots could be affected. 3. Disc bulging with moderate facet hypertrophy at L4-5 with resultant mild left lateral recess stenosis, with moderate left L4 foraminal narrowing. 4. Disc bulging with facet hypertrophy at L5-S1 with resultant moderate bilateral L5 foraminal stenosis. Electronically Signed   By: Jeannine Boga [name]   On: 02/27/2018 18:55   Vas US Carotid (at [facility] Only)  Result Date: 03/02/2018  Carotid Arterial Duplex Study Indications:       Weakness. Risk Factors:      Hypertension, hyperlipidemia. Comparison Study:  No prior study Performing Technologist: Sharion Dove RVS  Examination Guidelines: A complete evaluation includes B-mode imaging, spectral Doppler, color Doppler, and power Doppler as  needed of all accessible portions of each vessel. Bilateral testing is considered an integral part of a complete examination. Limited examinations for reoccurring indications may be performed as noted.  Right Carotid Findings: +----------+--------+--------+--------+------------+------------------+           PSV cm/sEDV cm/sStenosisDescribe    Comments           +----------+--------+--------+--------+------------+------------------+ CCA Prox  78      16                          intimal thickening +----------+--------+--------+--------+------------+------------------+ CCA Distal76      23                          intimal thickening +----------+--------+--------+--------+------------+------------------+ ICA Prox  79      19              heterogenous                   +----------+--------+--------+--------+------------+------------------+ ICA Mid   85      19                                             +----------+--------+--------+--------+------------+------------------+ ICA Distal109     29                                             +----------+--------+--------+--------+------------+------------------+ ECA       90      15                                             +----------+--------+--------+--------+------------+------------------+ +----------+--------+-------+--------+-------------------+           PSV cm/sEDV cmsDescribeArm Pressure (mmHG) +----------+--------+-------+--------+-------------------+ [id number]                                         +----------+--------+-------+--------+-------------------+ +---------+--------+--+--------+--+ VertebralPSV cm/s68EDV cm/s12 +---------+--------+--+--------+--+  Left Carotid Findings: +----------+--------+--------+--------+--------+------------------+           PSV cm/sEDV cm/sStenosisDescribeComments           +----------+--------+--------+--------+--------+------------------+ CCA Prox   122     24                      intimal thickening +----------+--------+--------+--------+--------+------------------+ CCA Distal96      21                      intimal thickening +----------+--------+--------+--------+--------+------------------+ ICA Prox  130     24              calcificShadowing          +----------+--------+--------+--------+--------+------------------+ ICA Distal110     30                                         +----------+--------+--------+--------+--------+------------------+  ECA       179                                                +----------+--------+--------+--------+--------+------------------+ +----------+--------+--------+--------+-------------------+ SubclavianPSV cm/sEDV cm/sDescribeArm Pressure (mmHG) +----------+--------+--------+--------+-------------------+           111                                         +----------+--------+--------+--------+-------------------+ +---------+--------+--+--------+--+ VertebralPSV cm/s64EDV cm/s17 +---------+--------+--+--------+--+  Summary: Right Carotid: Velocities in the right ICA are consistent with a 1-39% stenosis. Left Carotid: Velocities in the left ICA are consistent with a 1-39% stenosis. Vertebrals:  Bilateral vertebral arteries demonstrate antegrade flow. Subclavians: Normal flow hemodynamics were seen in bilateral subclavian              arteries. *See table(s) above for measurements and observations.  Electronically signed by Antony Contras MD on 03/02/2018 at 9:41:45 AM.    Final    US Abdomen Limited Ruq  Result Date: 02/28/2018 CLINICAL DATA:  78 y/o  F; elevated bilirubin. EXAM: ULTRASOUND ABDOMEN LIMITED RIGHT UPPER QUADRANT COMPARISON:  07/21/2015 CT abdomen and pelvis. FINDINGS: Gallbladder: Multiple gallstones measuring up to 4 mm. No gallbladder wall thickening or pericholecystic fluid. Negative sonographic Murphy's sign. Common bile duct: Diameter: 4.8 mm Liver:  No focal liver lesion identified. Increased liver echogenicity. Portal vein is patent on color Doppler imaging with normal direction of blood flow towards the liver. IMPRESSION: 1. Cholelithiasis.  No findings of acute cholecystitis. 2. Increased liver echogenicity compatible with steatosis. Electronically Signed   By: Kristine Garbe [name]   On: 02/28/2018 05:36    Microbiology: Recent Results (from the past 240 hour(s))  Urine culture     Status: Abnormal   Collection Time: 02/27/18  7:20 PM  Result Value Ref Range Status   Specimen Description URINE, RANDOM  Final   Special Requests   Final    NONE Performed at [facility], [address] [place], [place] [address]    Culture (A)  Final    >=100,000 COLONIES/mL ESCHERICHIA COLI Confirmed Extended Spectrum Beta-Lactamase Producer (ESBL).  In bloodstream infections from ESBL organisms, carbapenems are preferred over piperacillin/tazobactam. They are shown to have a lower risk of mortality.    Report Status 03/02/2018 FINAL  Final   Organism ID, Bacteria ESCHERICHIA COLI (A)  Final      Susceptibility   Escherichia coli - MIC*    AMPICILLIN >=32 RESISTANT Resistant     CEFAZOLIN >=64 RESISTANT Resistant     CEFTRIAXONE >=64 RESISTANT Resistant     CIPROFLOXACIN >=4 RESISTANT Resistant     GENTAMICIN <=1 SENSITIVE Sensitive     IMIPENEM <=0.25 SENSITIVE Sensitive     NITROFURANTOIN <=16 SENSITIVE Sensitive     TRIMETH/SULFA >=320 RESISTANT Resistant     AMPICILLIN/SULBACTAM 16 INTERMEDIATE Intermediate     PIP/TAZO <=4 SENSITIVE Sensitive     Extended ESBL POSITIVE Resistant     * >=100,000 COLONIES/mL ESCHERICHIA COLI  Respiratory Panel by PCR     Status: None   Collection Time: 02/28/18  1:04 PM  Result Value Ref Range Status   Adenovirus NOT DETECTED NOT DETECTED Final   Coronavirus 229E NOT DETECTED NOT DETECTED Final   Coronavirus HKU1  NOT DETECTED NOT DETECTED Final   Coronavirus NL63 NOT DETECTED NOT  DETECTED Final   Coronavirus OC43 NOT DETECTED NOT DETECTED Final   Metapneumovirus NOT DETECTED NOT DETECTED Final   Rhinovirus / Enterovirus NOT DETECTED NOT DETECTED Final   Influenza A NOT DETECTED NOT DETECTED Final   Influenza B NOT DETECTED NOT DETECTED Final   Parainfluenza Virus 1 NOT DETECTED NOT DETECTED Final   Parainfluenza Virus 2 NOT DETECTED NOT DETECTED Final   Parainfluenza Virus 3 NOT DETECTED NOT DETECTED Final   Parainfluenza Virus 4 NOT DETECTED NOT DETECTED Final   Respiratory Syncytial Virus NOT DETECTED NOT DETECTED Final   Bordetella pertussis NOT DETECTED NOT DETECTED Final   Chlamydophila pneumoniae NOT DETECTED NOT DETECTED Final   Mycoplasma pneumoniae NOT DETECTED NOT DETECTED Final     Labs: Basic Metabolic Panel: Recent Labs  Lab 02/27/18 1545 02/28/18 0607 03/01/18 0844 03/02/18 0611  NA 137 139 135 139  K 4.3 4.1 3.9 3.9  CL 106 107 108 110  CO2 21* 20* 18* 21*  GLUCOSE 112* 115* 182* 100*  BUN 29* 26* 23 21  CREATININE 1.52* 1.39* 1.61* 1.32*  CALCIUM 9.7 8.9 9.3 9.0   Liver Function Tests: Recent Labs  Lab 02/27/18 1545  AST 19  ALT 14  ALKPHOS 72  BILITOT 1.8*  PROT 7.2  ALBUMIN 3.4*   No results for input(s): LIPASE, AMYLASE in the last 168 hours. No results for input(s): AMMONIA in the last 168 hours. CBC: Recent Labs  Lab 02/27/18 1545 03/02/18 0611  WBC 5.6 4.9  NEUTROABS 4.4  --   HGB 11.0* 9.6*  HCT 34.6* 28.6*  MCV 94.3 92.9  PLT 179 217   Cardiac Enzymes: No results for input(s): CKTOTAL, CKMB, CKMBINDEX, TROPONINI in the last 168 hours. BNP: BNP (last 3 results) No results for input(s): BNP in the last 8760 hours.  ProBNP (last 3 results) No results for input(s): PROBNP in the last 8760 hours.  CBG: No results for input(s): GLUCAP in the last 168 hours.     SignedRadene Gunning NP [facility] 03/02/2018, 10:54 AM

## 2018-03-02 NOTE — Progress Notes (Signed)
Progress Note    Nicole Meyer  NUU:[id number] DOB: 02-08-1941  DOA: 02/27/2018 PCP: [name], MD    Brief Narrative:   Chief complaint: leg weakness  Medical records reviewed and are as summarized below:  Nicole Meyer is an 78 y.o. female with past medical history significant for anxiety, pleasant affect CKD 3, COPD, depression, GERD, HTN, HLD, and IBS; who presents with lower extremity weakness.  Assessment/Plan:   Principal Problem:   Acute CVA (cerebrovascular accident) ([employer]) Active Problems:   HTN (hypertension)   Asthma   Chronic kidney disease (CKD), stage III (moderate) (HCC)   UTI (urinary tract infection)   AKI (acute kidney injury) (HCC)   Serum total bilirubin elevated   PAD (peripheral artery disease) (HCC)   Lumbar stenosis   Chronic anemia CVA: Patient presented as a code stroke  with bilateral leg weakness. MRI showing acute/subacute 11 mm nonhemorrhagic infarct of the posterior right lentiform nucleus.  Neurology recommending Plavix and aspirin for 3 weeks, and then just continuing on Plavix.  Physical therapy evaluated recommending home with physical therapy. - Signed home health orders and Rollator brought to bedside - Outpatient follow-up with neurology   Urinary tract infection with hematuria: Acute.  Urinalysis was positive for moderate leukocytes and many bacteria.  Patient notes complaints of frequency.  Patient received 2 days of Rocephin IV.   - Switched to cephalexin po  Acute kidney injury superimposed on chronic kidney disease: Baseline creatinine previously noted to be around 1.18, but presented at admission with creatinine of 1.61 with BUN 23. - Bolus half a liter of normal saline IV fluids - Encourage patient to keep hydrated - Recheck BMP in a.m.   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 Question of leg pain could be related checked ESR and noted to be 86.  Patient is  followed by her primary care provider for this. - Prednisone 10 mg, and then 5 mg daily  Upper respiratory infection: Continue symptomatic treatment.  Hyperglycemia: Blood glucose elevated up to 182.  Patient currently not on steroids and hemoglobin A1c 5.2. - Continue to monitor and start subcutaneous insulin if needed  Body mass index is 29.1 kg/m.   Family Communication/Anticipated D/C date and plan/Code Status   DVT prophylaxis: Lovenox ordered. Code Status: Full Code.  Family Communication: Discussed plan of care with patient family present  Disposition Plan: Possible discharge home in 1 to 2 days   Medical Consultants:    Neurology   Anti-Infectives:    Ceftriaxone 2 days, then cephalexin  Subjective:   Patient initially states that she does not feel great, but is ready to go home. After lab work obtained states that she may need another day.  Objective:    Vitals:   03/01/18 1542 03/01/18 1955 03/01/18 2349 03/02/18 0404  BP: (!) 136/56 125/87 139/68 (!) 149/53  Pulse: 76 68 64 64  Resp: 18 18 17 18   Temp: 98.6 F (37 C) 98.7 F (37.1 C) 98.4 F (36.9 C) 98.2 F (36.8 C)  TempSrc: Oral Oral Oral Oral  SpO2: 98% 99% 97% 97%  Weight:      Height:       No intake or output data in the 24 hours ending 03/02/18 0437 Filed Weights   02/27/18 1543 02/27/18 2224  Weight: 75.3 kg 71 kg    Exam: Constitutional: elderly female in NAD, calm, comfortable Eyes: PERRL, lids and conjunctivae normal ENMT: Mucous  membranes are dry. Posterior pharynx clear of any exudate or lesions.  Neck: normal, supple, no masses, no thyromegaly Respiratory: clear to auscultation bilaterally, no wheezing, no crackles. Normal respiratory effort. No accessory muscle use.  Cardiovascular: Regular rate and rhythm, no murmurs / rubs / gallops. No extremity edema. 2+ pedal pulses. No carotid bruits.  Abdomen: no tenderness, no masses palpated. No hepatosplenomegaly. Bowel sounds  positive.  Musculoskeletal: no clubbing / cyanosis. No joint deformity upper and lower extremities. Good ROM, no contractures. Normal muscle tone.  Skin: no rashes, lesions, ulcers. No induration Neurologic: CN 2-12 grossly intact. Sensation intact, DTR normal. Strength 5/5 in upper extremities and 4/5 in lower extremities. Psychiatric: Normal judgment and insight. Alert and oriented x 3. Normal mood.    Data Reviewed:   I have personally reviewed following labs and imaging studies:  Labs: Labs show the following:   Basic Metabolic Panel: Recent Labs  Lab 02/27/18 1545 02/28/18 0607 03/01/18 0844  NA 137 139 135  K 4.3 4.1 3.9  CL 106 107 108  CO2 21* 20* 18*  GLUCOSE 112* 115* 182*  BUN 29* 26* 23  CREATININE 1.52* 1.39* 1.61*  CALCIUM 9.7 8.9 9.3   GFR Estimated Creatinine Clearance: 26.3 mL/min (A) (by C-G formula based on SCr of 1.61 mg/dL (H)). Liver Function Tests: Recent Labs  Lab 02/27/18 1545  AST 19  ALT 14  ALKPHOS 72  BILITOT 1.8*  PROT 7.2  ALBUMIN 3.4*   No results for input(s): LIPASE, AMYLASE in the last 168 hours. No results for input(s): AMMONIA in the last 168 hours. Coagulation profile No results for input(s): INR, PROTIME in the last 168 hours.  CBC: Recent Labs  Lab 02/27/18 1545  WBC 5.6  NEUTROABS 4.4  HGB 11.0*  HCT 34.6*  MCV 94.3  PLT 179   Cardiac Enzymes: No results for input(s): CKTOTAL, CKMB, CKMBINDEX, TROPONINI in the last 168 hours. BNP (last 3 results) No results for input(s): PROBNP in the last 8760 hours. CBG: No results for input(s): GLUCAP in the last 168 hours. D-Dimer: No results for input(s): DDIMER in the last 72 hours. Hgb A1c: Recent Labs    02/28/18 0607  HGBA1C 5.2   Lipid Profile: Recent Labs    02/28/18 0607  CHOL 154  HDL 22*  LDLCALC 101*  TRIG 156*  CHOLHDL 7.0   Thyroid function studies: No results for input(s): TSH, T4TOTAL, T3FREE, THYROIDAB in the last 72 hours.  Invalid  input(s): FREET3 Anemia work up: No results for input(s): VITAMINB12, FOLATE, FERRITIN, TIBC, IRON, RETICCTPCT in the last 72 hours. Sepsis Labs: Recent Labs  Lab 02/27/18 1545  WBC 5.6    Microbiology Recent Results (from the past 240 hour(s))  Urine culture     Status: Abnormal (Preliminary result)   Collection Time: 02/27/18  7:20 PM  Result Value Ref Range Status   Specimen Description URINE, RANDOM  Final   Special Requests   Final    NONE Performed at [facility], [address] [place], [place] [address]    Culture >=100,000 COLONIES/mL ESCHERICHIA COLI (A)  Final   Report Status PENDING  Incomplete  Respiratory Panel by PCR     Status: None   Collection Time: 02/28/18  1:04 PM  Result Value Ref Range Status   Adenovirus NOT DETECTED NOT DETECTED Final   Coronavirus 229E NOT DETECTED NOT DETECTED Final   Coronavirus HKU1 NOT DETECTED NOT DETECTED Final   Coronavirus NL63 NOT DETECTED NOT  DETECTED Final   Coronavirus OC43 NOT DETECTED NOT DETECTED Final   Metapneumovirus NOT DETECTED NOT DETECTED Final   Rhinovirus / Enterovirus NOT DETECTED NOT DETECTED Final   Influenza A NOT DETECTED NOT DETECTED Final   Influenza B NOT DETECTED NOT DETECTED Final   Parainfluenza Virus 1 NOT DETECTED NOT DETECTED Final   Parainfluenza Virus 2 NOT DETECTED NOT DETECTED Final   Parainfluenza Virus 3 NOT DETECTED NOT DETECTED Final   Parainfluenza Virus 4 NOT DETECTED NOT DETECTED Final   Respiratory Syncytial Virus NOT DETECTED NOT DETECTED Final   Bordetella pertussis NOT DETECTED NOT DETECTED Final   Chlamydophila pneumoniae NOT DETECTED NOT DETECTED Final   Mycoplasma pneumoniae NOT DETECTED NOT DETECTED Final    Procedures and diagnostic studies:  Ct Angio Head W Or Wo Contrast  Result Date: 02/28/2018 CLINICAL DATA:  Right basal ganglia acute infarct. Ataxia. Weakness. EXAM: CT ANGIOGRAPHY HEAD AND NECK TECHNIQUE: Multidetector CT imaging of the head and neck was  performed using the standard protocol during bolus administration of intravenous contrast. Multiplanar CT image reconstructions and MIPs were obtained to evaluate the vascular anatomy. Carotid stenosis measurements (when applicable) are obtained utilizing NASCET criteria, using the distal internal carotid diameter as the denominator. CONTRAST:  57m ISOVUE-370 IOPAMIDOL (ISOVUE-370) INJECTION 76% COMPARISON:  MRI brain 02/27/2018. FINDINGS: CTA NECK FINDINGS Aortic arch: A 3 vessel arch configuration is present. Atherosclerotic calcifications are present at the aortic arch without significant stenosis of the great vessel origins. Right carotid system: The right common carotid artery is within normal limits. Dense calcifications are present at the bifurcation. Minimal luminal diameter is 3 mm. There is no significant stenosis relative to the more distal vessel. The distal cervical left ICA right ICA is normal. Left carotid system: The left common carotid artery is tortuous without other significant atherosclerotic calcifications proximal to the bifurcation. Dense calcifications are present at the bifurcation. Minimal luminal diameter is 2.5 mm. This compares with a more distal measurement 4.6 mm. Vertebral arteries: The left vertebral artery is the dominant vessel. Focal calcification is present at the origin the non dominant right vertebral artery. There is no significant stenosis at the origin of the left vertebral artery from the subclavian. No additional stenoses are present in either vertebral artery in the neck. Skeleton: Degenerative disc disease is most evident at C5-6. Vertebral body heights are preserved. Patient is edentulous. No focal lytic or blastic lesions are present. Other neck: Soft tissues the neck are otherwise unremarkable. Salivary glands are within normal limits. No focal lytic or blastic lesions are present. Upper chest: Lung apices are clear. Thoracic inlet is within normal limits. Esophagus  is dilated. Review of the MIP images confirms the above findings CTA HEAD FINDINGS Anterior circulation: Atherosclerotic calcifications are present throughout the cavernous internal carotid arteries bilaterally without a significant luminal stenosis relative to the more distal vessels. The ICA termini are within normal limits bilaterally. The A1 and M1 segments are normal. The anterior communicating artery is patent. MCA bifurcations are intact. ACA and MCA branch vessels demonstrate mild distal small vessel disease without a significant proximal stenosis or occlusion. Posterior circulation: The left vertebral artery is the dominant vessel. Vertebrobasilar junction is normal. Basilar artery is small. The left posterior cerebral artery originates from the basilar tip. The right posterior cerebral artery is of fetal type. The PCA branch vessels demonstrate mild distal irregularity without a significant proximal stenosis or occlusion. No aneurysms are present. Venous sinuses: The dural sinuses are patent. Straight sinus  and deep cerebral veins are intact. Cortical veins are within normal limits. Anatomic variants: Fetal type right posterior cerebral artery. Delayed phase: The delayed postcontrast images accentuate the right basal ganglia infarct. White matter disease is again noted. No pathologic enhancement is present. Review of the MIP images confirms the above findings IMPRESSION: 1. No emergent large vessel occlusion. 2. Atherosclerotic changes at the aortic arch bilateral carotid bifurcations, and cavernous internal carotid arteries bilaterally without a significant stenosis in the anterior circulation. 3. High-grade stenosis the non dominant right vertebral artery origin. 4. Mild diffuse distal small vessel disease within the circle-of-Willis. 5. Mild degenerative changes of the cervical spine. 6. Dilated esophagus with some thickening. Question esophageal motility disorder. Electronically Signed   By:  San Morelle [name]   On: 02/28/2018 12:25   Ct Angio Neck W Or Wo Contrast  Result Date: 02/28/2018 CLINICAL DATA:  Right basal ganglia acute infarct. Ataxia. Weakness. EXAM: CT ANGIOGRAPHY HEAD AND NECK TECHNIQUE: Multidetector CT imaging of the head and neck was performed using the standard protocol during bolus administration of intravenous contrast. Multiplanar CT image reconstructions and MIPs were obtained to evaluate the vascular anatomy. Carotid stenosis measurements (when applicable) are obtained utilizing NASCET criteria, using the distal internal carotid diameter as the denominator. CONTRAST:  5m ISOVUE-370 IOPAMIDOL (ISOVUE-370) INJECTION 76% COMPARISON:  MRI brain 02/27/2018. FINDINGS: CTA NECK FINDINGS Aortic arch: A 3 vessel arch configuration is present. Atherosclerotic calcifications are present at the aortic arch without significant stenosis of the great vessel origins. Right carotid system: The right common carotid artery is within normal limits. Dense calcifications are present at the bifurcation. Minimal luminal diameter is 3 mm. There is no significant stenosis relative to the more distal vessel. The distal cervical left ICA right ICA is normal. Left carotid system: The left common carotid artery is tortuous without other significant atherosclerotic calcifications proximal to the bifurcation. Dense calcifications are present at the bifurcation. Minimal luminal diameter is 2.5 mm. This compares with a more distal measurement 4.6 mm. Vertebral arteries: The left vertebral artery is the dominant vessel. Focal calcification is present at the origin the non dominant right vertebral artery. There is no significant stenosis at the origin of the left vertebral artery from the subclavian. No additional stenoses are present in either vertebral artery in the neck. Skeleton: Degenerative disc disease is most evident at C5-6. Vertebral body heights are preserved. Patient is edentulous. No focal  lytic or blastic lesions are present. Other neck: Soft tissues the neck are otherwise unremarkable. Salivary glands are within normal limits. No focal lytic or blastic lesions are present. Upper chest: Lung apices are clear. Thoracic inlet is within normal limits. Esophagus is dilated. Review of the MIP images confirms the above findings CTA HEAD FINDINGS Anterior circulation: Atherosclerotic calcifications are present throughout the cavernous internal carotid arteries bilaterally without a significant luminal stenosis relative to the more distal vessels. The ICA termini are within normal limits bilaterally. The A1 and M1 segments are normal. The anterior communicating artery is patent. MCA bifurcations are intact. ACA and MCA branch vessels demonstrate mild distal small vessel disease without a significant proximal stenosis or occlusion. Posterior circulation: The left vertebral artery is the dominant vessel. Vertebrobasilar junction is normal. Basilar artery is small. The left posterior cerebral artery originates from the basilar tip. The right posterior cerebral artery is of fetal type. The PCA branch vessels demonstrate mild distal irregularity without a significant proximal stenosis or occlusion. No aneurysms are present. Venous sinuses: The dural  sinuses are patent. Straight sinus and deep cerebral veins are intact. Cortical veins are within normal limits. Anatomic variants: Fetal type right posterior cerebral artery. Delayed phase: The delayed postcontrast images accentuate the right basal ganglia infarct. White matter disease is again noted. No pathologic enhancement is present. Review of the MIP images confirms the above findings IMPRESSION: 1. No emergent large vessel occlusion. 2. Atherosclerotic changes at the aortic arch bilateral carotid bifurcations, and cavernous internal carotid arteries bilaterally without a significant stenosis in the anterior circulation. 3. High-grade stenosis the non dominant  right vertebral artery origin. 4. Mild diffuse distal small vessel disease within the circle-of-Willis. 5. Mild degenerative changes of the cervical spine. 6. Dilated esophagus with some thickening. Question esophageal motility disorder. Electronically Signed   By: San Morelle [name]   On: 02/28/2018 12:25   Vas US Carotid (at [place] Only)  Result Date: 02/28/2018 Carotid Arterial Duplex Study Indications:  Weakness. Risk Factors: Hypertension, hyperlipidemia. Performing Technologist: Sharion Dove RVS  Examination Guidelines: A complete evaluation includes B-mode imaging, spectral Doppler, color Doppler, and power Doppler as needed of all accessible portions of each vessel. Bilateral testing is considered an integral part of a complete examination. Limited examinations for reoccurring indications may be performed as noted.  Right Carotid Findings: +----------+--------+--------+--------+------------+------------------+           PSV cm/sEDV cm/sStenosisDescribe    Comments           +----------+--------+--------+--------+------------+------------------+ CCA Prox  78      16                          intimal thickening +----------+--------+--------+--------+------------+------------------+ CCA Distal76      23                          intimal thickening +----------+--------+--------+--------+------------+------------------+ ICA Prox  79      19              heterogenous                   +----------+--------+--------+--------+------------+------------------+ ICA Mid   85      19                                             +----------+--------+--------+--------+------------+------------------+ ICA Distal109     29                                             +----------+--------+--------+--------+------------+------------------+ ECA       90      15                                              +----------+--------+--------+--------+------------+------------------+ +----------+--------+-------+--------+-------------------+           PSV cm/sEDV cmsDescribeArm Pressure (mmHG) +----------+--------+-------+--------+-------------------+ [id number]                                         +----------+--------+-------+--------+-------------------+ +---------+--------+--+--------+--+ VertebralPSV cm/s68EDV cm/s12 +---------+--------+--+--------+--+  Left Carotid Findings: +----------+--------+--------+--------+--------+------------------+           PSV cm/sEDV cm/sStenosisDescribeComments           +----------+--------+--------+--------+--------+------------------+ CCA Prox  122     24                      intimal thickening +----------+--------+--------+--------+--------+------------------+ CCA Distal96      21                      intimal thickening +----------+--------+--------+--------+--------+------------------+ ICA Prox  130     24              calcificShadowing          +----------+--------+--------+--------+--------+------------------+ ICA Distal110     30                                         +----------+--------+--------+--------+--------+------------------+ ECA       179                                                +----------+--------+--------+--------+--------+------------------+ +----------+--------+--------+--------+-------------------+ SubclavianPSV cm/sEDV cm/sDescribeArm Pressure (mmHG) +----------+--------+--------+--------+-------------------+           111                                         +----------+--------+--------+--------+-------------------+ +---------+--------+--+--------+--+ VertebralPSV cm/s64EDV cm/s17 +---------+--------+--+--------+--+  Summary: Right Carotid: Velocities in the right ICA are consistent with a 1-39% stenosis. Left Carotid: Velocities in the left ICA are consistent with a  1-39% stenosis. Vertebrals:  Bilateral vertebral arteries demonstrate antegrade flow. Subclavians: Normal flow hemodynamics were seen in bilateral subclavian              arteries. *See table(s) above for measurements and observations.     Preliminary    US Abdomen Limited Ruq  Result Date: 02/28/2018 CLINICAL DATA:  78 y/o  F; elevated bilirubin. EXAM: ULTRASOUND ABDOMEN LIMITED RIGHT UPPER QUADRANT COMPARISON:  07/21/2015 CT abdomen and pelvis. FINDINGS: Gallbladder: Multiple gallstones measuring up to 4 mm. No gallbladder wall thickening or pericholecystic fluid. Negative sonographic Murphy's sign. Common bile duct: Diameter: 4.8 mm Liver: No focal liver lesion identified. Increased liver echogenicity. Portal vein is patent on color Doppler imaging with normal direction of blood flow towards the liver. IMPRESSION: 1. Cholelithiasis.  No findings of acute cholecystitis. 2. Increased liver echogenicity compatible with steatosis. Electronically Signed   By: Kristine Garbe [name]   On: 02/28/2018 05:36    Medications:   . aluminum hydroxide-magnesium carbonate  15 mL Oral QAC breakfast  . aspirin EC  81 mg Oral Daily  . atorvastatin  40 mg Oral q1800  . cephALEXin  250 mg Oral Q8H  . clopidogrel  75 mg Oral Daily  . enoxaparin (LOVENOX) injection  30 mg Subcutaneous Q24H  . folic acid  1 mg Oral QPC breakfast  . gabapentin  300 mg Oral BID  . pantoprazole  40 mg Oral Daily  . vitamin B-12  500 mcg Oral QPC breakfast   Continuous Infusions:   LOS: 0 days   Chrisie Jankovich A Karo Rog  [facility]   *Please refer to [email or web address],  password [email or web address] to get updated schedule on who will round on this patient, as hospitalists switch teams weekly. If 7PM-7AM, please contact night-coverage at [email or web address], password [email or web address] for any overnight needs.

## 2018-03-02 NOTE — Progress Notes (Signed)
Called [facility] for hospital f/u visit. Follow-up appointment made for Tuesday, March 10, 2018 at 4:15 PM.

## 2018-03-02 NOTE — Discharge Instructions (Signed)
Plavix and aspirin for 3 weeks, and then just continuing on Plavix

## 2018-03-02 NOTE — Care Management Note (Signed)
Case Management Note  Patient Details  Name: Nicole Meyer MRN: [id number] Date of Birth: 1941/01/19  Subjective/Objective:                    Action/Plan: New HH orders faxed to [facility].  Pt states rollator arrived and her spouse took it home. Daughter to provide transport home today after 4:30 pm. Bedside RN aware.   Expected Discharge Date:  03/02/18               Expected Discharge Plan:  [place]  In-House Referral:     Discharge planning Services  CM Consult  Post Acute Care Choice:  Home Health, Durable Medical Equipment Choice offered to:  Patient  DME Arranged:  [facility] rolling with seat DME Agency:  [place]:  PT, RN, OT, Social Work, Nurse's Aide [place] Agency:  [facility]  Status of Service:  Completed, signed off  If discussed at [employer], dates discussed:    Additional Comments:  Pollie Friar, RN 03/02/2018, 10:59 AM

## 2018-03-02 NOTE — Progress Notes (Signed)
Pt's IV meds swtiched to PO, pt doesn't have IV line as she didn't want one put on as she might be discharged today

## 2018-03-26 ENCOUNTER — Ambulatory Visit: Payer: Medicare Other | Admitting: Vascular Surgery

## 2018-03-26 ENCOUNTER — Other Ambulatory Visit: Payer: Self-pay

## 2018-03-26 ENCOUNTER — Encounter: Payer: Self-pay | Admitting: Vascular Surgery

## 2018-03-26 ENCOUNTER — Ambulatory Visit (HOSPITAL_COMMUNITY)
Admission: RE | Admit: 2018-03-26 | Discharge: 2018-03-26 | Disposition: A | Discharge status: Home / Self Care | Payer: Medicare Other | Source: Ambulatory Visit | Attending: Vascular Surgery | Admitting: Vascular Surgery

## 2018-03-26 VITALS — BP 166/79 | HR 58 | Temp 97.6°F | Resp 18 | Ht 61.5 in | Wt 156.0 lb

## 2018-03-26 DIAGNOSIS — I739 Peripheral vascular disease, unspecified: Secondary | ICD-10-CM | POA: Insufficient documentation

## 2018-03-26 NOTE — Progress Notes (Signed)
Referring Physician: Dr Marisue Humble  Patient name: Nicole Meyer MRN: [id number] DOB: 1940/10/13 Sex: female  REASON FOR CONSULT: Abnormal pulse exam  HPI: Nicole Meyer is a 78 y.o. female, sent for evaluation of an abnormal pulse exam in her lower extremities.  She denies any claudication symptoms.  She denies rest pain.  He has no nonhealing wounds.  Other chronic medical problems include arthritis elevated cholesterol lumbar stenosis.  These are all currently stable.  She is currently considering having a left total knee replacement.  She is on Lipitor and Plavix.  She had a recent stroke which left her with left-sided weakness but states this is improving.  Work-up showed no significant carotid stenosis.  Her stroke was in the mid January.  Past Medical History:  Diagnosis Date  . Anxiety   . Anxiety   . Arthritis    "knees and back" (02/12/2013)  . Arthritis   . Asthma    no problems recently  . Blood dyscrasia    sperocytosis  . Cancer ([place]) 04-06-12   skin cancer nasal bridge-no problems now  . Chronic kidney disease (CKD), stage III (moderate) (HCC)   . COPD (chronic obstructive pulmonary disease) ([place])   . Depression   . Depression   . Diverticulitis   . Fatigue   . GERD (gastroesophageal reflux disease)   . Gout 04/05/2013  . H/O hiatal hernia   . History of blood transfusion    "2 units in 12/2012; suppose to get 1 unit today" (02/12/2013)  . HTN (hypertension)   . Hyperbilirubinemia 05/03/2013  . Hypercalcemia   . Hypercholesteremia    "above borderline; I don't take RX for it" (12/26/2011)  . IBS (irritable bowel syndrome)   . IBS (irritable bowel syndrome)    ibs  . Iron deficiency anemia    [facility]  . Leukocytopenia   . Lumbar spinal stenosis   . Lymphopenia 08/23/2011  . Osteoarthritis   . Osteoporosis   . PMR (polymyalgia rheumatica) (HCC)    pmr  . Polymyalgia ([place])   . Renal insufficiency   . Syncope and collapse  12/24/2011   "loss of consciousness for 3-4 min" (12/26/2011)  . UTI (urinary tract infection) 12/26/2011   Past Surgical History:  Procedure Laterality Date  . APPENDECTOMY  80  . arthroscopic knee surgery-left    . BREAST EXCISIONAL BIOPSY Right 1995  . BREAST SURGERY  04-06-12   rt. lumpectomy_benign  . carpal tunnel right    . COLONOSCOPY, ESOPHAGOGASTRODUODENOSCOPY (EGD) AND ESOPHAGEAL DILATION     06/2013  . ESOPHAGOGASTRODUODENOSCOPY  12/26/2011   Procedure: ESOPHAGOGASTRODUODENOSCOPY (EGD);  Surgeon: Inda Castle, MD;  Location: [facility];  Service: Endoscopy;  Laterality: N/A;  . EYE SURGERY     cataracts  . HERNIA REPAIR  1970's   "stomach"   . LAPAROSCOPIC NISSEN FUNDOPLICATION N/A 6/62/9476   Procedure: Laparoscopic Reduction and Repair of Paraesophagel Hiatal Hernia with Nissen;  Surgeon: Adin Hector, MD;  Location: [facility];  Service: General;  Laterality: N/A;  Laparoscopic Reduction and Repair of Paraesophagel Hiatal Hernia with Nissen  . NISSEN FUNDOPLICATION     06/4648  . TOTAL KNEE ARTHROPLASTY Right 05/19/2015  . TOTAL KNEE ARTHROPLASTY Right 05/19/2015   Procedure: RIGHT TOTAL KNEE ARTHROPLASTY;  Surgeon: Netta Cedars, MD;  Location: [facility];  Service: Orthopedics;  Laterality: Right;  . TUBAL LIGATION  1970's  . VAGINAL HYSTERECTOMY  ~ 1980   right ovary  Family History  Problem Relation Age of Onset  . Diabetes Father   . Heart disease Father   . Breast cancer Maternal Aunt        in 8's    SOCIAL HISTORY: Social History   Socioeconomic History  . Marital status: Married    Spouse name: Not on file  . Number of children: 2  . Years of education: Not on file  . Highest education level: Not on file  Occupational History    Comment: retired Automotive engineer  Social Needs  . Financial resource strain: Not on file  . Food insecurity:    Worry: Not on file    Inability: Not on file  . Transportation needs:    Medical: Not on file     Non-medical: Not on file  Tobacco Use  . Smoking status: Former Smoker    Packs/day: 1.00    Years: 40.00    Pack years: 40.00    Types: Cigarettes    Last attempt to quit: 02/19/2000    Years since quitting: 18.1  . Smokeless tobacco: Never Used  Substance and Sexual Activity  . Alcohol use: No  . Drug use: No  . Sexual activity: Never    Birth control/protection: Post-menopausal  Lifestyle  . Physical activity:    Days per week: Not on file    Minutes per session: Not on file  . Stress: Not on file  Relationships  . Social connections:    Talks on phone: Not on file    Gets together: Not on file    Attends religious service: Not on file    Active member of club or organization: Not on file    Attends meetings of clubs or organizations: Not on file    Relationship status: Not on file  . Intimate partner violence:    Fear of current or ex partner: Not on file    Emotionally abused: Not on file    Physically abused: Not on file    Forced sexual activity: Not on file  Other Topics Concern  . Not on file  Social History Narrative  . Not on file    Allergies  Allergen Reactions  . Penicillins Hives    Last reaction was at 34 Has patient had a PCN reaction causing immediate rash, facial/tongue/throat swelling, SOB or lightheadedness with hypotension: Yes Has patient had a PCN reaction causing severe rash involving mucus membranes or skin necrosis: No Has patient had a PCN reaction that required hospitalization No Has patient had a PCN reaction occurring within the last 10 years: No If all of the above answers are "NO", then may proceed with Cephalosporin use.   Garlon Hatchet [Arformoterol] Other (See Comments)    "makes me nervous"  . Paxil [Paroxetine Hcl] Rash    Current Outpatient Medications  Medication Sig Dispense Refill  . albuterol (PROVENTIL HFA;VENTOLIN HFA) 108 (90 Base) MCG/ACT inhaler Inhale 2 puffs into the lungs every 6 (six) hours as needed for wheezing or  shortness of breath.    . Alum Hydroxide-Mag Carbonate 160-105 MG CHEW Chew 1 tablet by mouth daily after breakfast.    . amLODipine (NORVASC) 5 MG tablet Take 1 tablet by mouth daily.    [name] atorvastatin (LIPITOR) 40 MG tablet Take 1 tablet (40 mg total) by mouth daily at 6 PM. 30 tablet 0  . Calcium Carb-Cholecalciferol (CALCIUM 600 + D PO) Take 2 tablets by mouth daily after breakfast.    . clopidogrel (PLAVIX)  75 MG tablet Take 1 tablet (75 mg total) by mouth daily. 30 tablet 0  . cyanocobalamin 500 MCG tablet Take 500 mcg by mouth daily after breakfast. Vitamin S85    . folic acid (FOLVITE) 462 MCG tablet Take 400 mcg by mouth daily after breakfast.    . gabapentin (NEURONTIN) 600 MG tablet Take 0.5 tablets (300 mg total) by mouth 2 (two) times daily. 60 tablet 0  . Misc Natural Products (ESTROVEN ENERGY PO) Take 1 tablet by mouth daily.     . Multiple Vitamins-Minerals (OCUVITE PO) Take 1 tablet by mouth daily after breakfast.    . omeprazole (PRILOSEC) 20 MG capsule Take 1 capsule (20 mg total) by mouth daily. 30 capsule 0  . predniSONE (DELTASONE) 5 MG tablet Take 1 tablet (5 mg total) by mouth daily after breakfast. 30 tablet 0   No current facility-administered medications for this visit.     ROS:   General:  No weight loss, Fever, chills  HEENT: No recent headaches, no nasal bleeding, no visual changes, no sore throat  Neurologic: No dizziness, blackouts, seizures. No recent symptoms of stroke or mini- stroke. No recent episodes of slurred speech, or temporary blindness.  Cardiac: No recent episodes of chest pain/pressure, no shortness of breath at rest.  No shortness of breath with exertion.  Denies history of atrial fibrillation or irregular heartbeat  Vascular: No history of rest pain in feet.  No history of claudication.  No history of non-healing ulcer, No history of DVT   Pulmonary: No home oxygen, no productive cough, no hemoptysis,  No asthma or  wheezing  Musculoskeletal:  [X]  Arthritis, [X]  Low back pain,  [X]  Joint pain  Hematologic:No history of hypercoagulable state.  No history of easy bleeding.  No history of anemia  Gastrointestinal: No hematochezia or melena,  No gastroesophageal reflux, no trouble swallowing  Urinary: [ ]  chronic Kidney disease, [ ]  on HD - [ ]  MWF or [ ]  TTHS, [ ]  Burning with urination, [ ]  Frequent urination, [ ]  Difficulty urinating;   Skin: No rashes  Psychological: No history of anxiety,  No history of depression   Physical Examination  Vitals:   03/26/18 1018  BP: (!) 166/79  Pulse: (!) 58  Resp: 18  Temp: 97.6 F (36.4 C)  SpO2: 97%  Weight: 156 lb (70.8 kg)  Height: 5' 1.5" (1.562 m)    Body mass index is 29 kg/m.  General:  Alert and oriented, no acute distress HEENT: Normal Neck: No bruit or JVD Pulmonary: Clear to auscultation bilaterally Cardiac: Regular Rate and Rhythm without murmur Abdomen: Soft, non-tender, non-distended, no mass Skin: No rash Extremity Pulses:  2+ radial, brachial, femoral, absent dorsalis pedis, posterior tibial pulses bilaterally Musculoskeletal: No deformity or edema  Neurologic: Upper and lower extremity motor 5/5 and symmetric  DATA:  Patient had bilateral ABIs today which were greater than 1 bilaterally.  ASSESSMENT: Patient asymptomatic with nonpalpable pedal pulses but normal ABIs.  Do not believe she is currently at risk of limb loss.  She is also currently asymptomatic.  He has also recently had a stroke and would not be a candidate for any intervention unless she had limb threatening ischemia at this point.   PLAN: Patient will follow-up in 6 months time at that time we will obtain bilateral lower extremity duplex exam to make sure she has no significant stenosis.  If this is normal she can probably follow-up on an as-needed basis.   Ruta Hinds,  MD [facility] Office: [phone] Pager:  [phone]

## 2018-05-04 ENCOUNTER — Inpatient Hospital Stay: Payer: Medicare Other | Admitting: Adult Health

## 2018-05-15 ENCOUNTER — Inpatient Hospital Stay: Payer: Medicare Other | Admitting: Adult Health

## 2018-05-18 ENCOUNTER — Telehealth: Payer: Self-pay

## 2018-05-18 NOTE — Telephone Encounter (Signed)
Called and left a message asking to call the office. 

## 2018-05-18 NOTE — Telephone Encounter (Signed)
-----   Message from Heath Lark, MD sent at 05/18/2018 11:09 AM EDT ----- Regarding: reschedule to July If she is ok reschedule to July

## 2018-05-26 ENCOUNTER — Other Ambulatory Visit: Payer: Medicare Other

## 2018-05-26 ENCOUNTER — Ambulatory Visit: Payer: Medicare Other | Admitting: Hematology and Oncology

## 2018-06-26 ENCOUNTER — Telehealth: Payer: Self-pay | Admitting: Adult Health

## 2018-06-26 NOTE — Telephone Encounter (Signed)
Pt called re: her upcoming appointment that was set for 05-12. When pt was told the appointment would need to be VV pt asked to be r/s to late July for an in office visit.

## 2018-06-29 NOTE — Telephone Encounter (Signed)
Noted! Thank you

## 2018-06-30 ENCOUNTER — Inpatient Hospital Stay: Payer: Self-pay | Admitting: Adult Health

## 2018-08-24 ENCOUNTER — Other Ambulatory Visit: Payer: Self-pay | Admitting: Hematology and Oncology

## 2018-08-24 DIAGNOSIS — N183 Chronic kidney disease, stage 3 unspecified: Secondary | ICD-10-CM

## 2018-08-24 DIAGNOSIS — D594 Other nonautoimmune hemolytic anemias: Secondary | ICD-10-CM

## 2018-08-25 ENCOUNTER — Inpatient Hospital Stay (HOSPITAL_BASED_OUTPATIENT_CLINIC_OR_DEPARTMENT_OTHER): Payer: Medicare Other | Admitting: Hematology and Oncology

## 2018-08-25 ENCOUNTER — Other Ambulatory Visit: Payer: Self-pay

## 2018-08-25 ENCOUNTER — Inpatient Hospital Stay: Payer: Medicare Other | Attending: Hematology and Oncology

## 2018-08-25 DIAGNOSIS — I10 Essential (primary) hypertension: Secondary | ICD-10-CM

## 2018-08-25 DIAGNOSIS — Z79899 Other long term (current) drug therapy: Secondary | ICD-10-CM | POA: Insufficient documentation

## 2018-08-25 DIAGNOSIS — D594 Other nonautoimmune hemolytic anemias: Secondary | ICD-10-CM

## 2018-08-25 DIAGNOSIS — J449 Chronic obstructive pulmonary disease, unspecified: Secondary | ICD-10-CM | POA: Insufficient documentation

## 2018-08-25 DIAGNOSIS — Z8673 Personal history of transient ischemic attack (TIA), and cerebral infarction without residual deficits: Secondary | ICD-10-CM

## 2018-08-25 DIAGNOSIS — R531 Weakness: Secondary | ICD-10-CM | POA: Diagnosis not present

## 2018-08-25 DIAGNOSIS — D589 Hereditary hemolytic anemia, unspecified: Secondary | ICD-10-CM | POA: Diagnosis not present

## 2018-08-25 DIAGNOSIS — Z880 Allergy status to penicillin: Secondary | ICD-10-CM

## 2018-08-25 DIAGNOSIS — N183 Chronic kidney disease, stage 3 unspecified: Secondary | ICD-10-CM

## 2018-08-25 LAB — COMPREHENSIVE METABOLIC PANEL
ALT: 11 U/L (ref 0–44)
AST: 15 U/L (ref 15–41)
Albumin: 3.9 g/dL (ref 3.5–5.0)
Alkaline Phosphatase: 83 U/L (ref 38–126)
Anion gap: 11 (ref 5–15)
BUN: 22 mg/dL (ref 8–23)
CO2: 24 mmol/L (ref 22–32)
Calcium: 10.2 mg/dL (ref 8.9–10.3)
Chloride: 107 mmol/L (ref 98–111)
Creatinine, Ser: 1.33 mg/dL — ABNORMAL HIGH (ref 0.44–1.00)
GFR calc Af Amer: 44 mL/min — ABNORMAL LOW (ref >60)
GFR calc non Af Amer: 38 mL/min — ABNORMAL LOW (ref >60)
Glucose, Bld: 115 mg/dL — ABNORMAL HIGH (ref 70–99)
Potassium: 4.5 mmol/L (ref 3.5–5.1)
Sodium: 142 mmol/L (ref 135–145)
Total Bilirubin: 0.7 mg/dL (ref 0.3–1.2)
Total Protein: 7.2 g/dL (ref 6.5–8.1)

## 2018-08-25 LAB — CBC WITH DIFFERENTIAL/PLATELET
Abs Immature Granulocytes: 0.02 10*3/uL (ref 0.00–0.07)
Basophils Absolute: 0 10*3/uL (ref 0.0–0.1)
Basophils Relative: 1 %
Eosinophils Absolute: 0.3 10*3/uL (ref 0.0–0.5)
Eosinophils Relative: 4 %
HCT: 38.6 % (ref 36.0–46.0)
Hemoglobin: 12.8 g/dL (ref 12.0–15.0)
Immature Granulocytes: 0 %
Lymphocytes Relative: 6 %
Lymphs Abs: 0.4 10*3/uL — ABNORMAL LOW (ref 0.7–4.0)
MCH: 31.4 pg (ref 26.0–34.0)
MCHC: 33.2 g/dL (ref 30.0–36.0)
MCV: 94.6 fL (ref 80.0–100.0)
Monocytes Absolute: 0.4 10*3/uL (ref 0.1–1.0)
Monocytes Relative: 6 %
Neutro Abs: 5.4 10*3/uL (ref 1.7–7.7)
Neutrophils Relative %: 83 %
Platelets: 193 10*3/uL (ref 150–400)
RBC: 4.08 MIL/uL (ref 3.87–5.11)
RDW: 12.9 % (ref 11.5–15.5)
WBC: 6.5 10*3/uL (ref 4.0–10.5)
nRBC: 0 % (ref 0.0–0.2)

## 2018-08-26 ENCOUNTER — Encounter: Payer: Self-pay | Admitting: Hematology and Oncology

## 2018-08-26 NOTE — Assessment & Plan Note (Signed)
Screening tests for PNH was negative. 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 I recommend she takes high-dose folic acid and vitamin B12 supplements. I will continue prednisone at 5 mg daily that also treats her COPD.   her blood count appears to be responding to this and tolerating that well, keeping her hemoglobin >10 I will see her back in 12 months. I recommend calcium and vitamin D supplement to prevent osteoporosis I warned her about risk of GI bleed and ulcers with concurrent use of NSAID.

## 2018-08-26 NOTE — Progress Notes (Signed)
[name] OFFICE PROGRESS NOTE  Nicole Arabian, MD  ASSESSMENT & PLAN:  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 I recommend she takes high-dose folic acid and vitamin B12 supplements. I will continue prednisone at 5 mg daily that also treats her COPD.   her blood count appears to be responding to this and tolerating that well, keeping her hemoglobin >10 I will see her back in 12 months. I recommend calcium and vitamin D supplement to prevent osteoporosis I warned her about risk of GI bleed and ulcers with concurrent use of NSAID.  HTN (hypertension) She has high blood pressure but not symptomatic The patient felt she could have whitecoat hypertension Due to her recent stroke, I recommend close monitoring of blood pressure at home    No orders of the defined types were placed in this encounter.   INTERVAL HISTORY: Nicole Meyer 78 y.o. female returns for further follow-up The patient is still recovering from recent stroke She has some residual weakness in her legs No recent falls She complains of fatigue No recent bleeding  SUMMARY OF HEMATOLOGIC HISTORY:  This is a pleasant lady with background history of severe anemia. In July of 2013 she had bone marrow aspirate and biopsy that came back hypercellular but unremarkable. She was recommended to take iron supplements. Between November to December 2014, she had multiple hospitalization requiring blood transfusion. Iron supplement was discontinued and she was being observed. She was also placed on prednisone. Osmotic fragility testing came back positive, suspicious for hemoglobinopathy such as spherocytosis. Since June 2016, she was maintained on prednisone 5 mg daily   I have reviewed the past medical history, past surgical history, social history and family history with the patient  and they are unchanged from previous note.  ALLERGIES:  is allergic to penicillins; brovana [arformoterol]; and paxil [paroxetine hcl].  MEDICATIONS:  Current Outpatient Medications  Medication Sig Dispense Refill  . albuterol (PROVENTIL HFA;VENTOLIN HFA) 108 (90 Base) MCG/ACT inhaler Inhale 2 puffs into the lungs every 6 (six) hours as needed for wheezing or shortness of breath.    . Alum Hydroxide-Mag Carbonate 160-105 MG CHEW Chew 1 tablet by mouth daily after breakfast.    . amLODipine (NORVASC) 5 MG tablet Take 1 tablet by mouth daily.    [name] atorvastatin (LIPITOR) 40 MG tablet Take 1 tablet (40 mg total) by mouth daily at 6 PM. 30 tablet 0  . Calcium Carb-Cholecalciferol (CALCIUM 600 + D PO) Take 2 tablets by mouth daily after breakfast.    . clopidogrel (PLAVIX) 75 MG tablet Take 1 tablet (75 mg total) by mouth daily. 30 tablet 0  . cyanocobalamin 500 MCG tablet Take 500 mcg by mouth daily after breakfast. Vitamin B34    . folic acid (FOLVITE) 193 MCG tablet Take 400 mcg by mouth daily after breakfast.    . gabapentin (NEURONTIN) 600 MG tablet Take 0.5 tablets (300 mg total) by mouth 2 (two) times daily. 60 tablet 0  . Misc Natural Products (ESTROVEN ENERGY PO) Take 1 tablet by mouth daily.     . Multiple Vitamins-Minerals (OCUVITE PO) Take 1 tablet by mouth daily after breakfast.    . omeprazole (PRILOSEC) 20 MG capsule Take 1 capsule (20 mg total) by mouth daily. 30 capsule 0  . predniSONE (DELTASONE) 5 MG tablet Take 1 tablet (5 mg total) by mouth daily after breakfast. 30 tablet 0  No current facility-administered medications for this visit.      REVIEW OF SYSTEMS:   Constitutional: Denies fevers, chills or night sweats Eyes: Denies blurriness of vision Ears, nose, mouth, throat, and face: Denies mucositis or sore throat Respiratory: Denies cough, dyspnea or wheezes Cardiovascular: Denies palpitation, chest discomfort or lower extremity swelling Gastrointestinal:  Denies  nausea, heartburn or change in bowel habits Skin: Denies abnormal skin rashes Lymphatics: Denies new lymphadenopathy or easy bruising Neurological:Denies numbness, tingling or new weaknesses Behavioral/Psych: Mood is stable, no new changes  All other systems were reviewed with the patient and are negative.  PHYSICAL EXAMINATION: ECOG PERFORMANCE STATUS: 1 - Symptomatic but completely ambulatory  Vitals:   08/25/18 1224  BP: (!) 171/76  Pulse: 68  Resp: 18  Temp: 98.2 F (36.8 C)  SpO2: 97%   Filed Weights   08/25/18 1224  Weight: 170 lb 9.6 oz (77.4 kg)    GENERAL:alert, no distress and comfortable SKIN: skin color, texture, turgor are normal, no rashes or significant lesions EYES: normal, Conjunctiva are pink and non-injected, sclera clear OROPHARYNX:no exudate, no erythema and lips, buccal mucosa, and tongue normal  NECK: supple, thyroid normal size, non-tender, without nodularity LYMPH:  no palpable lymphadenopathy in the cervical, axillary or inguinal LUNGS: clear to auscultation and percussion with normal breathing effort HEART: regular rate & rhythm and no murmurs and no lower extremity edema ABDOMEN:abdomen soft, non-tender and normal bowel sounds Musculoskeletal:no cyanosis of digits and no clubbing  NEURO: alert & oriented x 3 with fluent speech, no focal motor/sensory deficits  LABORATORY DATA:  I have reviewed the data as listed     Component Value Date/Time   NA 142 08/25/2018 1213   NA 139 08/23/2016 1022   K 4.5 08/25/2018 1213   K 4.8 08/23/2016 1022   CL 107 08/25/2018 1213   CL 107 07/06/2012 1005   CO2 24 08/25/2018 1213   CO2 22 08/23/2016 1022   GLUCOSE 115 (H) 08/25/2018 1213   GLUCOSE 113 08/23/2016 1022   GLUCOSE 97 07/06/2012 1005   BUN 22 08/25/2018 1213   BUN 24.9 08/23/2016 1022   CREATININE 1.33 (H) 08/25/2018 1213   CREATININE 1.3 (H) 08/23/2016 1022   CALCIUM 10.2 08/25/2018 1213   CALCIUM 9.7 08/23/2016 1022   PROT 7.2  08/25/2018 1213   PROT 6.9 08/23/2016 1022   ALBUMIN 3.9 08/25/2018 1213   ALBUMIN 3.7 08/23/2016 1022   AST 15 08/25/2018 1213   AST 18 08/23/2016 1022   ALT 11 08/25/2018 1213   ALT 16 08/23/2016 1022   ALKPHOS 83 08/25/2018 1213   ALKPHOS 87 08/23/2016 1022   BILITOT 0.7 08/25/2018 1213   BILITOT 0.80 08/23/2016 1022   GFRNONAA 38 (L) 08/25/2018 1213   GFRAA 44 (L) 08/25/2018 1213    No results found for: SPEP, UPEP  Lab Results  Component Value Date   WBC 6.5 08/25/2018   NEUTROABS 5.4 08/25/2018   HGB 12.8 08/25/2018   HCT 38.6 08/25/2018   MCV 94.6 08/25/2018   PLT 193 08/25/2018      Chemistry      Component Value Date/Time   NA 142 08/25/2018 1213   NA 139 08/23/2016 1022   K 4.5 08/25/2018 1213   K 4.8 08/23/2016 1022   CL 107 08/25/2018 1213   CL 107 07/06/2012 1005   CO2 24 08/25/2018 1213   CO2 22 08/23/2016 1022   BUN 22 08/25/2018 1213   BUN 24.9 08/23/2016 1022   CREATININE  1.33 (H) 08/25/2018 1213   CREATININE 1.3 (H) 08/23/2016 1022      Component Value Date/Time   CALCIUM 10.2 08/25/2018 1213   CALCIUM 9.7 08/23/2016 1022   ALKPHOS 83 08/25/2018 1213   ALKPHOS 87 08/23/2016 1022   AST 15 08/25/2018 1213   AST 18 08/23/2016 1022   ALT 11 08/25/2018 1213   ALT 16 08/23/2016 1022   BILITOT 0.7 08/25/2018 1213   BILITOT 0.80 08/23/2016 1022      I spent 10 minutes counseling the patient face to face. The total time spent in the appointment was 15 minutes and more than 50% was on counseling.   All questions were answered. The patient knows to call the clinic with any problems, questions or concerns. No barriers to learning was detected.    Heath Lark, MD 7/8/202011:16 AM

## 2018-08-26 NOTE — Assessment & Plan Note (Signed)
She has high blood pressure but not symptomatic The patient felt she could have whitecoat hypertension Due to her recent stroke, I recommend close monitoring of blood pressure at home

## 2018-09-07 ENCOUNTER — Telehealth: Payer: Self-pay | Admitting: Adult Health

## 2018-09-07 NOTE — Telephone Encounter (Signed)
I called patient regarding her 7/27 appointment. I LVM stating that our office has made schedule changes and for now Nicole Meyer is seeing virtual visits on Monday mornings. Patient has previously declined virtual visit per chart notes. I requested patient call back and reschedule.

## 2018-09-09 ENCOUNTER — Ambulatory Visit (INDEPENDENT_AMBULATORY_CARE_PROVIDER_SITE_OTHER): Payer: Medicare Other | Admitting: Adult Health

## 2018-09-09 ENCOUNTER — Other Ambulatory Visit: Payer: Self-pay

## 2018-09-09 ENCOUNTER — Encounter: Payer: Self-pay | Admitting: Adult Health

## 2018-09-09 VITALS — BP 173/73 | HR 70 | Temp 98.2°F | Ht 61.5 in | Wt 170.8 lb

## 2018-09-09 DIAGNOSIS — I639 Cerebral infarction, unspecified: Secondary | ICD-10-CM | POA: Diagnosis not present

## 2018-09-09 DIAGNOSIS — M79604 Pain in right leg: Secondary | ICD-10-CM | POA: Diagnosis not present

## 2018-09-09 DIAGNOSIS — E785 Hyperlipidemia, unspecified: Secondary | ICD-10-CM

## 2018-09-09 DIAGNOSIS — I10 Essential (primary) hypertension: Secondary | ICD-10-CM | POA: Diagnosis not present

## 2018-09-09 DIAGNOSIS — M79605 Pain in left leg: Secondary | ICD-10-CM

## 2018-09-09 MED ORDER — ROSUVASTATIN CALCIUM 20 MG PO TABS: 20.0000 mg | ORAL_TABLET | Freq: Every day | ORAL | 3 refills | Status: AC

## 2018-09-09 NOTE — Patient Instructions (Addendum)
Continue clopidogrel 75 mg daily  and stop lipitor and start Crestor 20mg   for secondary stroke prevention  Continue to follow up with PCP regarding cholesterol and blood pressure management   Start to monitor blood pressure on both arms and write down numbers at home and follow up with PCP if remains elevated  Maintain strict control of hypertension with blood pressure goal below 130/90, diabetes with hemoglobin A1c goal below 6.5% and cholesterol with LDL cholesterol (bad cholesterol) goal below 70 mg/dL. I also advised the patient to eat a healthy diet with plenty of whole grains, cereals, fruits and vegetables, exercise regularly and maintain ideal body weight.  Followup in the future with me in 3 months or call earlier if needed       Thank you for coming to see [place] at [facility]. I hope we have been able to provide you high quality care today.  You may receive a patient satisfaction survey over the next few weeks. We would appreciate your feedback and comments so that we may continue to improve ourselves and the health of our patients.

## 2018-09-09 NOTE — Progress Notes (Signed)
[facility] [address] [place]. [place] [address] [phone]       Nicole Meyer Date of Birth:  [birth year] Medical Record Number:  [id number]   Reason for Referral:  hospital stroke follow up    CHIEF COMPLAINT:  Chief Complaint  Patient presents with   Hospitalization Follow-up    Stroke f/u. Husband present. Treatment room. Patient mentioned that she has been having pain in her legs and nothing seems to help.     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  History obtained from patient and chart review. Reviewed all radiology images and labs personally.  Nicole Meyer is a 78 y.o. female with history of Htn, Hld, Renal Insufficiency, PMR, Leukopenia, COPD, and anemia requiring transfusions presenting with generalized weakness. She did not receive IV t-PA due to late presentation.  Stroke work-up revealed acute/subacute 11 mm nonhemorrhagic infarct in the posterior right lentiform nucleus possibly secondary to small vessel disease as evidenced on CT had an MRI.  Vessel imaging showed mild diffuse atherosclerosis and right nondominant VA origin stenosis.  2D echo unremarkable.  LDL 101 and A1c 5.2.  Initiate atorvastatin 40 mg daily pressure management.  Recommended DAPT for 3 weeks then Plavix alone as previously on aspirin.  HTN stable.  Other stroke risk factors include advanced age, former tobacco use and prior history of stroke/TIA by imaging.  Discharged home in stable condition with recommendation of home health PT.  Residual deficits mild bilateral lower extremity weakness but does endorse improvement. Does use RW or cane to ambulate without recent falls. Therapy stopped to due recent onset of bilateral leg pain since hospitalization.  She endorses bilateral leg pain which is constant and  describes it as a cramping throbbing sensation.  This pain will become worse with activity therefore had difficulty participating in therapy. Completed 3 weeks DAPT and continues on Plavix alone without bleeding but mild bruises Continues on atorvastatin 40 mg daily with possible statin myalgias causing bilateral lower extremity pain Blood pressure 173/73 -does not routinely monitor at home but does state typically elevated at provider appointments Denies new or worsening stroke/TIA symptoms   ROS:   14 system review of systems performed and negative with exception of bilateral leg pain, ambulation difficulty and weakness  PMH:  Past Medical History:  Diagnosis Date   Anxiety    Anxiety    Arthritis    "knees and back" (02/12/2013)   Arthritis    Asthma    no problems recently   Blood dyscrasia    sperocytosis   Cancer ([place]) 04-06-12   skin cancer nasal bridge-no problems now   Chronic kidney disease (CKD), stage III (moderate) (HCC)    COPD (chronic obstructive pulmonary disease) ([place])    Depression    Depression    Diverticulitis    Fatigue    GERD (gastroesophageal reflux disease)    Gout 04/05/2013   H/O hiatal hernia    History of blood transfusion    "2 units in 12/2012; suppose to get 1 unit today" (02/12/2013)   HTN (hypertension)    Hyperbilirubinemia 05/03/2013   Hypercalcemia    Hypercholesteremia    "above borderline; I don't take RX for it" (12/26/2011)   IBS (irritable bowel syndrome)    IBS (irritable bowel syndrome)    ibs   Iron deficiency anemia  [facility]   Leukocytopenia    Lumbar spinal stenosis    Lymphopenia 08/23/2011   Osteoarthritis    Osteoporosis    PMR (polymyalgia rheumatica) (HCC)    pmr   Polymyalgia ([facility])    Renal insufficiency    Syncope and collapse 12/24/2011   "loss of consciousness for 3-4 min" (12/26/2011)   UTI (urinary tract infection) 12/26/2011    PSH:  Past  Surgical History:  Procedure Laterality Date   APPENDECTOMY  80   arthroscopic knee surgery-left     BREAST EXCISIONAL BIOPSY Right 1995   BREAST SURGERY  04-06-12   rt. lumpectomy_benign   carpal tunnel right     COLONOSCOPY, ESOPHAGOGASTRODUODENOSCOPY (EGD) AND ESOPHAGEAL DILATION     06/2013   ESOPHAGOGASTRODUODENOSCOPY  12/26/2011   Procedure: ESOPHAGOGASTRODUODENOSCOPY (EGD);  Surgeon: Inda Castle, MD;  Location: [facility];  Service: Endoscopy;  Laterality: N/A;   EYE SURGERY     cataracts   HERNIA REPAIR  1970's   "stomach"    LAPAROSCOPIC NISSEN FUNDOPLICATION N/A 07/23/5407   Procedure: Laparoscopic Reduction and Repair of Paraesophagel Hiatal Hernia with Nissen;  Surgeon: Adin Hector, MD;  Location: [facility];  Service: General;  Laterality: N/A;  Laparoscopic Reduction and Repair of Paraesophagel Hiatal Hernia with Nissen   NISSEN FUNDOPLICATION     09/1189   TOTAL KNEE ARTHROPLASTY Right 05/19/2015   TOTAL KNEE ARTHROPLASTY Right 05/19/2015   Procedure: RIGHT TOTAL KNEE ARTHROPLASTY;  Surgeon: Netta Cedars, MD;  Location: [facility];  Service: Orthopedics;  Laterality: Right;   TUBAL LIGATION  1970's   VAGINAL HYSTERECTOMY  ~ 1980   right ovary    Social History:  Social History   Socioeconomic History   Marital status: Married    Spouse name: Not on file   Number of children: 2   Years of education: Not on file   Highest education level: Not on file  Occupational History    Comment: retired Restaurant manager, fast food strain: Not on file   Food insecurity    Worry: Not on file    Inability: Not on file   Transportation needs    Medical: Not on file    Non-medical: Not on file  Tobacco Use   Smoking status: Former Smoker    Packs/day: 1.00    Years: 40.00    Pack years: 40.00    Types: Cigarettes    Quit date: 02/19/2000    Years since quitting: 18.5   Smokeless tobacco: Never Used  Substance and  Sexual Activity   Alcohol use: No   Drug use: No   Sexual activity: Never    Birth control/protection: Post-menopausal  Lifestyle   Physical activity    Days per week: Not on file    Minutes per session: Not on file   Stress: Not on file  Relationships   Social connections    Talks on phone: Not on file    Gets together: Not on file    Attends religious service: Not on file    Active member of club or organization: Not on file    Attends meetings of clubs or organizations: Not on file    Relationship status: Not on file   Intimate partner violence    Fear of current or ex partner: Not on file    Emotionally abused: Not on file    Physically abused: Not on file    Forced sexual activity:  Not on file  Other Topics Concern   Not on file  Social History Narrative   Not on file    Family History:  Family History  Problem Relation Age of Onset   Diabetes Father    Heart disease Father    Breast cancer Maternal Aunt        in 95's    Medications:   Current Outpatient Medications on File Prior to Visit  Medication Sig Dispense Refill   albuterol (PROVENTIL HFA;VENTOLIN HFA) 108 (90 Base) MCG/ACT inhaler Inhale 2 puffs into the lungs every 6 (six) hours as needed for wheezing or shortness of breath.     Alum Hydroxide-Mag Carbonate 160-105 MG CHEW Chew 1 tablet by mouth daily after breakfast.     amLODipine (NORVASC) 5 MG tablet Take 1 tablet by mouth daily.     atorvastatin (LIPITOR) 40 MG tablet Take 1 tablet (40 mg total) by mouth daily at 6 PM. 30 tablet 0   Calcium Carb-Cholecalciferol (CALCIUM 600 + D PO) Take 2 tablets by mouth daily after breakfast.     clopidogrel (PLAVIX) 75 MG tablet Take 1 tablet (75 mg total) by mouth daily. 30 tablet 0   cyanocobalamin 500 MCG tablet Take 500 mcg by mouth daily after breakfast. Vitamin K53     folic acid (FOLVITE) 976 MCG tablet Take 400 mcg by mouth daily after breakfast.     gabapentin (NEURONTIN) 600 MG  tablet Take 0.5 tablets (300 mg total) by mouth 2 (two) times daily. 60 tablet 0   Misc Natural Products (ESTROVEN ENERGY PO) Take 1 tablet by mouth daily.      Multiple Vitamins-Minerals (OCUVITE PO) Take 1 tablet by mouth daily after breakfast.     omeprazole (PRILOSEC) 20 MG capsule Take 1 capsule (20 mg total) by mouth daily. 30 capsule 0   predniSONE (DELTASONE) 5 MG tablet Take 1 tablet (5 mg total) by mouth daily after breakfast. 30 tablet 0   No current facility-administered medications on file prior to visit.     Allergies:   Allergies  Allergen Reactions   Penicillins Hives    Last reaction was at 34 Has patient had a PCN reaction causing immediate rash, facial/tongue/throat swelling, SOB or lightheadedness with hypotension: Yes Has patient had a PCN reaction causing severe rash involving mucus membranes or skin necrosis: No Has patient had a PCN reaction that required hospitalization No Has patient had a PCN reaction occurring within the last 10 years: No If all of the above answers are "NO", then may proceed with Cephalosporin use.    Brovana [Arformoterol] Other (See Comments)    "makes me nervous"   Paxil [Paroxetine Hcl] Rash     Physical Exam  Vitals:   09/09/18 1401  BP: (!) 173/73  Pulse: 70  Temp: 98.2 F (36.8 C)  TempSrc: Oral  Weight: 170 lb 12.8 oz (77.5 kg)  Height: 5' 1.5" (1.562 m)   Body mass index is 31.75 kg/m. No exam data present  No flowsheet data found.   General: well developed, well nourished,  pleasant elderly Caucasian female, seated, in no evident distress Head: head normocephalic and atraumatic.   Neck: supple with no carotid or supraclavicular bruits Cardiovascular: regular rate and rhythm, no murmurs Musculoskeletal: no deformity Skin:  no rash/petichiae Vascular:  Normal pulses all extremities   Neurologic Exam Mental Status: Awake and fully alert. Oriented to place and time. Recent and remote memory intact.  Attention span, concentration and fund of knowledge appropriate.  Mood and affect appropriate.  Cranial Nerves: Fundoscopic exam reveals sharp disc margins. Pupils equal, briskly reactive to light. Extraocular movements full without nystagmus. Visual fields full to confrontation. Hearing intact. Facial sensation intact. Face, tongue, palate moves normally and symmetrically.  Motor: Normal bulk and tone.  Mild bilateral hip flexor weakness but overall good strength throughout all extremities Sensory.: intact to touch , pinprick , position and vibratory sensation.  Coordination: Rapid alternating movements normal in all extremities. Finger-to-nose performed accurately and heel-to-shin moderate difficulty due to hip weakness and leg pain. Gait and Station: Arises from chair with mild difficulty. Stance is hunched. Gait demonstrates normal slow cautious steps with use of cane Reflexes: 1+ and symmetric. Toes downgoing.     NIHSS  2 Modified Rankin  2   Diagnostic Data (Labs, Imaging, Testing)   Resultant b/l leg mild weakness than normal  CT head - Small 8 mm focus of hypodensity in the right posterior basal ganglia consistent with lacunar infarct,  MRI head - Acute/subacute 11 mm nonhemorrhagic infarct in the posterior right lentiform nucleus.   CTA H&N - diffuse mild atherosclerosis.  Right nondominant VA origin stenosis  Carotid Doppler  - unremarkable  2D Echo EF 55 to 60%  LDL - 101  HgbA1c - 5.2    ASSESSMENT: Nicole Meyer is a 78 y.o. year old female here with acute/subacute posterior right lentiform nucleus infarct on 02/27/2018 secondary to small vessel disease. Vascular risk factors include HTN, HLD, prior tobacco use and history of stroke on imaging.  Has been doing well from a neurological standpoint with residual mild bilateral lower extremity weakness along with bilateral lower extremity pain which has been present since hospital discharge.  Question possible statin  myalgia    PLAN:  1. Stroke: Continue clopidogrel 75 mg daily for secondary stroke prevention.  Due to possible statin myalgia, recommend discontinuing atorvastatin and initiate Crestor 20 mg daily.  Maintain strict control of hypertension with blood pressure goal below 130/90, diabetes with hemoglobin A1c goal below 6.5% and cholesterol with LDL cholesterol (bad cholesterol) goal below 70 mg/dL.  I also advised the patient to eat a healthy diet with plenty of whole grains, cereals, fruits and vegetables, exercise regularly with at least 30 minutes of continuous activity daily and maintain ideal body weight. 2. HTN: Advised to continue current treatment regimen.   Advised to start to monitor at home along with continued follow-up with PCP for management 3. HLD: Due to bilateral leg pain which could possibly be associated with statin myalgias, recommend discontinue atorvastatin and initiate rosuvastatin 20 mg daily.  Advised to continue to follow with PCP for ongoing management and monitoring   Follow up in 3 months or call earlier if needed   Greater than 50% of time during this 45 minute visit was spent on counseling, explanation of diagnosis of posterior right lentiform nucleus infarct, reviewing risk factor management of HTN and HLD, planning of further management along with potential future management, and discussion with patient and family answering all questions.    Venancio Poisson, AGNP-BC  [facility] [address] [place], [place] [address]  Phone [phone] Fax [phone] Note: This document was prepared with digital dictation and possible smart phrase technology. Any transcriptional errors that result from this process are unintentional.

## 2018-09-10 ENCOUNTER — Encounter: Payer: Self-pay | Admitting: Adult Health

## 2018-09-10 NOTE — Progress Notes (Signed)
I agree with the above plan 

## 2018-09-14 ENCOUNTER — Inpatient Hospital Stay: Payer: Self-pay | Admitting: Adult Health

## 2018-09-24 ENCOUNTER — Ambulatory Visit: Payer: Medicare Other | Admitting: Family

## 2018-09-24 ENCOUNTER — Encounter (HOSPITAL_COMMUNITY): Payer: Medicare Other

## 2018-12-10 ENCOUNTER — Ambulatory Visit: Payer: Medicare Other | Admitting: Adult Health

## 2019-06-30 ENCOUNTER — Other Ambulatory Visit: Payer: Self-pay | Admitting: Family Medicine

## 2019-06-30 DIAGNOSIS — Z1231 Encounter for screening mammogram for malignant neoplasm of breast: Secondary | ICD-10-CM

## 2019-08-06 ENCOUNTER — Telehealth: Payer: Self-pay | Admitting: Cardiovascular Disease

## 2019-08-06 NOTE — Telephone Encounter (Signed)
Nicole Meyer is calling requesting her husband attend her upcoming appointment scheduled for 08/10/19 due to her not being able to drive and using a walker due to a stroke. Please advise.

## 2019-08-06 NOTE — Telephone Encounter (Signed)
Left detailed message that husband may attend appointment with patient on Tuesday.

## 2019-08-10 ENCOUNTER — Encounter: Payer: Self-pay | Admitting: Cardiovascular Disease

## 2019-08-10 ENCOUNTER — Ambulatory Visit: Payer: Medicare PPO | Admitting: Cardiovascular Disease

## 2019-08-10 ENCOUNTER — Other Ambulatory Visit: Payer: Self-pay

## 2019-08-10 ENCOUNTER — Other Ambulatory Visit: Payer: Self-pay | Admitting: Cardiovascular Disease

## 2019-08-10 VITALS — BP 134/78 | HR 64 | Ht 61.5 in | Wt 171.0 lb

## 2019-08-10 DIAGNOSIS — I200 Unstable angina: Secondary | ICD-10-CM | POA: Diagnosis not present

## 2019-08-10 DIAGNOSIS — I10 Essential (primary) hypertension: Secondary | ICD-10-CM | POA: Diagnosis not present

## 2019-08-10 LAB — BASIC METABOLIC PANEL
BUN/Creatinine Ratio: 14 (ref 12–28)
BUN: 24 mg/dL (ref 8–27)
CO2: 26 mmol/L (ref 20–29)
Calcium: 10.3 mg/dL (ref 8.7–10.3)
Chloride: 105 mmol/L (ref 96–106)
Creatinine, Ser: 1.67 mg/dL — ABNORMAL HIGH (ref 0.57–1.00)
GFR calc Af Amer: 33 mL/min/{1.73_m2} — ABNORMAL LOW (ref >59)
GFR calc non Af Amer: 29 mL/min/{1.73_m2} — ABNORMAL LOW (ref >59)
Glucose: 142 mg/dL — ABNORMAL HIGH (ref 65–99)
Potassium: 4.8 mmol/L (ref 3.5–5.2)
Sodium: 141 mmol/L (ref 134–144)

## 2019-08-10 LAB — CBC
Hematocrit: 33.5 % — ABNORMAL LOW (ref 34.0–46.6)
Hemoglobin: 10.9 g/dL — ABNORMAL LOW (ref 11.1–15.9)
MCH: 30.5 pg (ref 26.6–33.0)
MCHC: 32.5 g/dL (ref 31.5–35.7)
MCV: 94 fL (ref 79–97)
Platelets: 200 10*3/uL (ref 150–450)
RBC: 3.57 x10E6/uL — ABNORMAL LOW (ref 3.77–5.28)
RDW: 13.2 % (ref 11.7–15.4)
WBC: 7.6 10*3/uL (ref 3.4–10.8)

## 2019-08-10 MED ORDER — ISOSORBIDE MONONITRATE ER 30 MG PO TB24: 30.0000 mg | ORAL_TABLET | Freq: Every day | ORAL | 11 refills | Status: DC

## 2019-08-10 MED ORDER — NITROGLYCERIN 0.4 MG SL SUBL: 0.4000 mg | SUBLINGUAL_TABLET | SUBLINGUAL | 6 refills | Status: DC | PRN

## 2019-08-10 NOTE — H&P (View-Only) (Signed)
Cardiology Office Note:    Date:  08/10/2019   ID:  Nicole Meyer, DOB April 15, 1940, MRN [id number]  PCP:  [name], MD  [facility] Cardiologist:  [facility] Electrophysiologist:  None   Referring MD: [name], MD   Chief Complaint  Patient presents with  . Shortness of Breath    History of Present Illness:    Nicole Meyer is a 79 y.o. female with a hx of DOE for the past 6 months or so .  She has a hx of a CVA , hyperlipidemia   typcially very active.   Feeds her feral cats.  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  The DOE has worsened gradually  Watches her salt to some degree.  Occasional chest pain ,  Worse with exertion.   Worse with walking  Is associated with the DOE .  No diaphoresis,  No dizziness.   + family hx of CAD Mother and father , both grandfathers had CAD Grandmother had a CVA  She had a heart cath by Cherly Hensen, MD about 20 years ago  Echo from Jan. 2020 shows normal LV function with EF 55-60%. Grade 1 diastolic dysfunction   Dr. Marisue Humble changed her BP med to amlodipine which has helped her chest tightness.    Past Medical History:  Diagnosis Date  . Anxiety   . Anxiety   . Arthritis    "knees and back" (02/12/2013)  . Arthritis   . Asthma    no problems recently  . Blood dyscrasia    sperocytosis  . Cancer ([place]) 04-06-12   skin cancer nasal bridge-no problems now  . Chronic kidney disease (CKD), stage III (moderate)   . COPD (chronic obstructive pulmonary disease) ([place])   . Depression   . Depression   . Diverticulitis   . Fatigue   . GERD (gastroesophageal reflux disease)   . Gout 04/05/2013  . H/O hiatal hernia   . History of blood transfusion    "2 units in 12/2012; suppose to get 1 unit today" (02/12/2013)  . HTN (hypertension)   . Hyperbilirubinemia 05/03/2013  . Hypercalcemia   . Hypercholesteremia    "above borderline; I don't take RX for it" (12/26/2011)  . IBS (irritable bowel  syndrome)   . IBS (irritable bowel syndrome)    ibs  . Iron deficiency anemia    [facility]  . Leukocytopenia   . Lumbar spinal stenosis   . Lymphopenia 08/23/2011  . Osteoarthritis   . Osteoporosis   . PMR (polymyalgia rheumatica) (HCC)    pmr  . Polymyalgia ([place])   . Renal insufficiency   . Syncope and collapse 12/24/2011   "loss of consciousness for 3-4 min" (12/26/2011)  . UTI (urinary tract infection) 12/26/2011    Past Surgical History:  Procedure Laterality Date  . APPENDECTOMY  80  . arthroscopic knee surgery-left    . BREAST EXCISIONAL BIOPSY Right 1995  . BREAST SURGERY  04-06-12   rt. lumpectomy_benign  . carpal tunnel right    . COLONOSCOPY, ESOPHAGOGASTRODUODENOSCOPY (EGD) AND ESOPHAGEAL DILATION     06/2013  . ESOPHAGOGASTRODUODENOSCOPY  12/26/2011   Procedure: ESOPHAGOGASTRODUODENOSCOPY (EGD);  Surgeon: Inda Castle, MD;  Location: [place];  Service: Endoscopy;  Laterality: N/A;  . EYE SURGERY     cataracts  . HERNIA REPAIR  1970's   "stomach"   . LAPAROSCOPIC NISSEN FUNDOPLICATION N/A 7/37/1062   Procedure: Laparoscopic Reduction and Repair of  Paraesophagel Hiatal Hernia with Nissen;  Surgeon: Adin Hector, MD;  Location: [facility];  Service: General;  Laterality: N/A;  Laparoscopic Reduction and Repair of Paraesophagel Hiatal Hernia with Nissen  . NISSEN FUNDOPLICATION     02/6107  . TOTAL KNEE ARTHROPLASTY Right 05/19/2015  . TOTAL KNEE ARTHROPLASTY Right 05/19/2015   Procedure: RIGHT TOTAL KNEE ARTHROPLASTY;  Surgeon: Netta Cedars, MD;  Location: [place];  Service: Orthopedics;  Laterality: Right;  . TUBAL LIGATION  1970's  . VAGINAL HYSTERECTOMY  ~ 1980   right ovary    Current Medications: Current Meds  Medication Sig  . albuterol (PROVENTIL HFA;VENTOLIN HFA) 108 (90 Base) MCG/ACT inhaler Inhale 2 puffs into the lungs every 6 (six) hours as needed for wheezing or shortness of breath.  [name] amLODipine (NORVASC) 5 MG tablet  Take 1 tablet by mouth daily.  . Calcium Carb-Cholecalciferol (CALCIUM 600 + D PO) Take 2 tablets by mouth daily after breakfast.  . clopidogrel (PLAVIX) 75 MG tablet Take 1 tablet (75 mg total) by mouth daily.  . cyanocobalamin 500 MCG tablet Take 500 mcg by mouth daily after breakfast. Vitamin B12  . Ferrous Sulfate (IRON PO) Take 65 mg by mouth daily.  . folic acid (FOLVITE) 604 MCG tablet Take 400 mcg by mouth daily after breakfast.   . metoprolol succinate (TOPROL-XL) 25 MG 24 hr tablet Take 1 tablet by mouth daily.  . Misc Natural Products (ESTROVEN ENERGY PO) Take 1 tablet by mouth daily.   . Multiple Vitamins-Minerals (OCUVITE PO) Take 1 tablet by mouth daily after breakfast.  . Naproxen Sodium (ALEVE) 220 MG CAPS Take 2 capsules by mouth daily.  [name] omeprazole (PRILOSEC) 20 MG capsule Take 1 capsule (20 mg total) by mouth daily.  . predniSONE (DELTASONE) 5 MG tablet Take 1 tablet (5 mg total) by mouth daily after breakfast.  . rosuvastatin (CRESTOR) 20 MG tablet Take 1 tablet (20 mg total) by mouth daily.     Allergies:   Penicillins, Brovana [arformoterol], and Paxil [paroxetine hcl]   Social History   Socioeconomic History  . Marital status: Married    Spouse name: Not on file  . Number of children: 2  . Years of education: Not on file  . Highest education level: Not on file  Occupational History    Comment: retired Automotive engineer  Tobacco Use  . Smoking status: Former Smoker    Packs/day: 1.00    Years: 40.00    Pack years: 40.00    Types: Cigarettes    Quit date: 02/19/2000    Years since quitting: 19.4  . Smokeless tobacco: Never Used  Vaping Use  . Vaping Use: Never used  Substance and Sexual Activity  . Alcohol use: No  . Drug use: No  . Sexual activity: Never    Birth control/protection: Post-menopausal  Other Topics Concern  . Not on file  Social History Narrative  . Not on file   Social Determinants of Health   Financial Resource Strain:   .  Difficulty of Paying Living Expenses:   Food Insecurity:   . Worried About Charity fundraiser in the Last Year:   . Arboriculturist in the Last Year:   Transportation Needs:   . Film/video editor (Medical):   [name] Lack of Transportation (Non-Medical):   Physical Activity:   . Days of Exercise per Week:   . Minutes of Exercise per Session:   Stress:   . Feeling of Stress :  Social Connections:   . Frequency of Communication with Friends and Family:   . Frequency of Social Gatherings with Friends and Family:   . Attends Religious Services:   . Active Member of Clubs or Organizations:   . Attends Archivist Meetings:   [name] Marital Status:      Family History: The patient's family history includes Breast cancer in her maternal aunt; Diabetes in her father; Heart disease in her father.  ROS:   Please see the history of present illness.     All other systems reviewed and are negative.  EKGs/Labs/Other Studies Reviewed:    The following studies were reviewed today:   EKG: August 10, 2019: Normal sinus rhythm at 64.  Nonspecific ST and T wave abnormalities.  Recent Labs: 08/25/2018: ALT 11 08/10/2019: BUN 24; Creatinine, Ser 1.67; Hemoglobin 10.9; Platelets 200; Potassium 4.8; Sodium 141  Recent Lipid Panel    Component Value Date/Time   CHOL 154 02/28/2018 0607   TRIG 156 (H) 02/28/2018 0607   HDL 22 (L) 02/28/2018 0607   CHOLHDL 7.0 02/28/2018 0607   VLDL 31 02/28/2018 0607   LDLCALC 101 (H) 02/28/2018 0607    Physical Exam:    VS:  BP 134/78   Pulse 64   Ht 5' 1.5" (1.562 m)   Wt 171 lb (77.6 kg)   SpO2 97%   BMI 31.79 kg/m     Wt Readings from Last 3 Encounters:  08/10/19 171 lb (77.6 kg)  09/09/18 170 lb 12.8 oz (77.5 kg)  08/25/18 170 lb 9.6 oz (77.4 kg)     GEN: Elderly, somewhat frail female, no acute distress currently. HEENT: Normal NECK: No JVD; No carotid bruits LYMPHATICS: No lymphadenopathy CARDIAC: RRR, no murmurs, rubs,  gallops RESPIRATORY:  Clear to auscultation without rales, wheezing or rhonchi  ABDOMEN: Soft, non-tender, non-distended MUSCULOSKELETAL:  No edema; No deformity  SKIN: Warm and dry NEUROLOGIC:  Alert and oriented x 3 PSYCHIATRIC:  Normal affect   ASSESSMENT:    1. Unstable angina (HCC)    PLAN:    In order of problems listed above:  1. Unstable angina: This is fully presents with symptoms that are consistent with unstable angina.  She has shortness of breath and chest tightness that have been progressing for the past 6 months.  These episodes occur every time she does any sort of exertion.  Her symptoms have improved slightly when she was started on amlodipine.  I think that she needs to be referred for heart catheterization.  She is already on Plavix 75 mg a day. We discussed the risks, benefits, options of heart catheterization.  She understands and agrees to proceed.  2.  Hyperlipidemia: Continue current medications.  3.  Hypertension: Continue current medications.  Blood pressure seems to be well controlled.   Medication Adjustments/Labs and Tests Ordered: Current medicines are reviewed at length with the patient today.  Concerns regarding medicines are outlined above.  Orders Placed This Encounter  Procedures  . CBC  . Basic Metabolic Panel (BMET)  . EKG 12-Lead   Meds ordered this encounter  Medications  . isosorbide mononitrate (IMDUR) 30 MG 24 hr tablet    Sig: Take 1 tablet (30 mg total) by mouth daily.    Dispense:  30 tablet    Refill:  11  . nitroGLYCERIN (NITROSTAT) 0.4 MG SL tablet    Sig: Place 1 tablet (0.4 mg total) under the tongue every 5 (five) minutes as needed for chest pain.  Dispense:  25 tablet    Refill:  6    Patient Instructions  Medication Instructions:  Your physician has recommended you make the following change in your medication:  START Isosorbide (Imdur) 30 mg once daily USE Sublingual nitroglycerin under your tongue as  needed   *If you need a refill on your cardiac medications before your next appointment, please call your pharmacy*   Lab Work: TODAY - CBC, BMET If you have labs (blood work) drawn today and your tests are completely normal, you will receive your results only by: [name] MyChart Message (if you have MyChart) OR . A paper copy in the mail If you have any lab test that is abnormal or we need to change your treatment, we will call you to review the results.    Follow-Up: At [facility], you and your health needs are our priority.  As part of our continuing mission to provide you with exceptional heart care, we have created designated Provider Care Teams.  These Care Teams include your primary Cardiologist (physician) and Advanced Practice Providers (APPs -  Physician Assistants and Nurse Practitioners) who all work together to provide you with the care you need, when you need it.   Your next appointment:   2 month(s) on August 24  The format for your next appointment:   In Person  Provider:   Mertie Moores, MD   Testing/Procedures:    [facility] OFFICE [place], [place] [address] Dept: [phone] Loc: [phone]  ARLAYNE LIGGINS  08/10/2019  You are scheduled for a Cardiac Catheterization on Thursday, June 24 with Dr. Larae Grooms.  1. Please arrive at the [facility] (Main Entrance A) at [facility]: [address] [place], [place] [address] at 5:30 AM (This time is two hours before your procedure to ensure your preparation). Free valet parking service is available.   Special note: Every effort is made to have your procedure done on time. Please understand that emergencies sometimes delay scheduled procedures.  2. Diet: Do not eat solid foods after midnight.  The patient may have clear liquids until 5am upon the day of the procedure.  3. Labs: Done  Today  4. Medication instructions in preparation for your procedure:   Contrast Allergy: No   On the morning of your procedure, take your Plavix/Clopidogrel and any morning medicines NOT listed above.  You may use sips of water.  5. Plan for one night stay--bring personal belongings. 6. Bring a current list of your medications and current insurance cards. 7. You MUST have a responsible person to drive you home. 8. Someone MUST be with you the first 24 hours after you arrive home or your discharge will be delayed. 9. Please wear clothes that are easy to get on and off and wear slip-on shoes.  Thank you for allowing [place] to care for you!   -- [facility] Invasive Cardiovascular services        Signed, Mertie Moores, MD  08/10/2019 6:15 PM    [place]

## 2019-08-10 NOTE — Patient Instructions (Addendum)
Medication Instructions:  Your physician has recommended you make the following change in your medication:  START Isosorbide (Imdur) 30 mg once daily USE Sublingual nitroglycerin under your tongue as needed   *If you need a refill on your cardiac medications before your next appointment, please call your pharmacy*   Lab Work: TODAY - CBC, BMET If you have labs (blood work) drawn today and your tests are completely normal, you will receive your results only by: [name] MyChart Message (if you have MyChart) OR . A paper copy in the mail If you have any lab test that is abnormal or we need to change your treatment, we will call you to review the results.    Follow-Up: At [facility], you and your health needs are our priority.  As part of our continuing mission to provide you with exceptional heart care, we have created designated Provider Care Teams.  These Care Teams include your primary Cardiologist (physician) and Advanced Practice Providers (APPs -  Physician Assistants and Nurse Practitioners) who all work together to provide you with the care you need, when you need it.   Your next appointment:   2 month(s) on August 24  The format for your next appointment:   In Person  Provider:   Mertie Moores, MD   Testing/Procedures:    [facility] OFFICE [place], [place] [address] Dept: [phone] Loc: [phone]  CHANTALE LEUGERS  08/10/2019  You are scheduled for a Cardiac Catheterization on Thursday, June 24 with Dr. Larae Grooms.  1. Please arrive at the [facility] (Main Entrance A) at [facility]: [address] [place], [place] [address] at 5:30 AM (This time is two hours before your procedure to ensure your preparation). Free valet parking service is available.   Special note: Every effort is made to have your procedure done on time. Please understand that  emergencies sometimes delay scheduled procedures.  2. Diet: Do not eat solid foods after midnight.  The patient may have clear liquids until 5am upon the day of the procedure.  3. Labs: Done Today  4. Medication instructions in preparation for your procedure:   Contrast Allergy: No   On the morning of your procedure, take your Plavix/Clopidogrel and any morning medicines NOT listed above.  You may use sips of water.  5. Plan for one night stay--bring personal belongings. 6. Bring a current list of your medications and current insurance cards. 7. You MUST have a responsible person to drive you home. 8. Someone MUST be with you the first 24 hours after you arrive home or your discharge will be delayed. 9. Please wear clothes that are easy to get on and off and wear slip-on shoes.  Thank you for allowing [place] to care for you!   -- [employer] Invasive Cardiovascular services

## 2019-08-10 NOTE — Progress Notes (Signed)
Cardiology Office Note:    Date:  08/10/2019   ID:  [name], DOB 1940-04-26, MRN [id number]  PCP:  [name], MD  [facility] Cardiologist:  [facility] Electrophysiologist:  None   Referring MD: [name], MD   Chief Complaint  Patient presents with  . Shortness of Breath    History of Present Illness:    Nicole Meyer is a 79 y.o. female with a hx of DOE for the past 6 months or so .  She has a hx of a CVA , hyperlipidemia   typcially very active.   Feeds her feral cats.  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  The DOE has worsened gradually  Watches her salt to some degree.  Occasional chest pain ,  Worse with exertion.   Worse with walking  Is associated with the DOE .  No diaphoresis,  No dizziness.   + family hx of CAD Mother and father , both grandfathers had CAD Grandmother had a CVA  She had a heart cath by Cherly Hensen, MD about 20 years ago  Echo from Jan. 2020 shows normal LV function with EF 55-60%. Grade 1 diastolic dysfunction   Dr. Marisue Humble changed her BP med to amlodipine which has helped her chest tightness.    Past Medical History:  Diagnosis Date  . Anxiety   . Anxiety   . Arthritis    "knees and back" (02/12/2013)  . Arthritis   . Asthma    no problems recently  . Blood dyscrasia    sperocytosis  . Cancer ([place]) 04-06-12   skin cancer nasal bridge-no problems now  . Chronic kidney disease (CKD), stage III (moderate)   . COPD (chronic obstructive pulmonary disease) ([place])   . Depression   . Depression   . Diverticulitis   . Fatigue   . GERD (gastroesophageal reflux disease)   . Gout 04/05/2013  . H/O hiatal hernia   . History of blood transfusion    "2 units in 12/2012; suppose to get 1 unit today" (02/12/2013)  . HTN (hypertension)   . Hyperbilirubinemia 05/03/2013  . Hypercalcemia   . Hypercholesteremia    "above borderline; I don't take RX for it" (12/26/2011)  . IBS (irritable bowel  syndrome)   . IBS (irritable bowel syndrome)    ibs  . Iron deficiency anemia    [facility]  . Leukocytopenia   . Lumbar spinal stenosis   . Lymphopenia 08/23/2011  . Osteoarthritis   . Osteoporosis   . PMR (polymyalgia rheumatica) (HCC)    pmr  . Polymyalgia ([place])   . Renal insufficiency   . Syncope and collapse 12/24/2011   "loss of consciousness for 3-4 min" (12/26/2011)  . UTI (urinary tract infection) 12/26/2011    Past Surgical History:  Procedure Laterality Date  . APPENDECTOMY  80  . arthroscopic knee surgery-left    . BREAST EXCISIONAL BIOPSY Right 1995  . BREAST SURGERY  04-06-12   rt. lumpectomy_benign  . carpal tunnel right    . COLONOSCOPY, ESOPHAGOGASTRODUODENOSCOPY (EGD) AND ESOPHAGEAL DILATION     06/2013  . ESOPHAGOGASTRODUODENOSCOPY  12/26/2011   Procedure: ESOPHAGOGASTRODUODENOSCOPY (EGD);  Surgeon: Inda Castle, MD;  Location: [place];  Service: Endoscopy;  Laterality: N/A;  . EYE SURGERY     cataracts  . HERNIA REPAIR  1970's   "stomach"   . LAPAROSCOPIC NISSEN FUNDOPLICATION N/A 05/26/6806   Procedure: Laparoscopic Reduction and Repair of  Paraesophagel Hiatal Hernia with Nissen;  Surgeon: Adin Hector, MD;  Location: [facility];  Service: General;  Laterality: N/A;  Laparoscopic Reduction and Repair of Paraesophagel Hiatal Hernia with Nissen  . NISSEN FUNDOPLICATION     05/9700  . TOTAL KNEE ARTHROPLASTY Right 05/19/2015  . TOTAL KNEE ARTHROPLASTY Right 05/19/2015   Procedure: RIGHT TOTAL KNEE ARTHROPLASTY;  Surgeon: Netta Cedars, MD;  Location: [place];  Service: Orthopedics;  Laterality: Right;  . TUBAL LIGATION  1970's  . VAGINAL HYSTERECTOMY  ~ 1980   right ovary    Current Medications: Current Meds  Medication Sig  . albuterol (PROVENTIL HFA;VENTOLIN HFA) 108 (90 Base) MCG/ACT inhaler Inhale 2 puffs into the lungs every 6 (six) hours as needed for wheezing or shortness of breath.  [name] amLODipine (NORVASC) 5 MG tablet  Take 1 tablet by mouth daily.  . Calcium Carb-Cholecalciferol (CALCIUM 600 + D PO) Take 2 tablets by mouth daily after breakfast.  . clopidogrel (PLAVIX) 75 MG tablet Take 1 tablet (75 mg total) by mouth daily.  . cyanocobalamin 500 MCG tablet Take 500 mcg by mouth daily after breakfast. Vitamin B12  . Ferrous Sulfate (IRON PO) Take 65 mg by mouth daily.  . folic acid (FOLVITE) 637 MCG tablet Take 400 mcg by mouth daily after breakfast.   . metoprolol succinate (TOPROL-XL) 25 MG 24 hr tablet Take 1 tablet by mouth daily.  . Misc Natural Products (ESTROVEN ENERGY PO) Take 1 tablet by mouth daily.   . Multiple Vitamins-Minerals (OCUVITE PO) Take 1 tablet by mouth daily after breakfast.  . Naproxen Sodium (ALEVE) 220 MG CAPS Take 2 capsules by mouth daily.  [name] omeprazole (PRILOSEC) 20 MG capsule Take 1 capsule (20 mg total) by mouth daily.  . predniSONE (DELTASONE) 5 MG tablet Take 1 tablet (5 mg total) by mouth daily after breakfast.  . rosuvastatin (CRESTOR) 20 MG tablet Take 1 tablet (20 mg total) by mouth daily.     Allergies:   Penicillins, Brovana [arformoterol], and Paxil [paroxetine hcl]   Social History   Socioeconomic History  . Marital status: Married    Spouse name: Not on file  . Number of children: 2  . Years of education: Not on file  . Highest education level: Not on file  Occupational History    Comment: retired Automotive engineer  Tobacco Use  . Smoking status: Former Smoker    Packs/day: 1.00    Years: 40.00    Pack years: 40.00    Types: Cigarettes    Quit date: 02/19/2000    Years since quitting: 19.4  . Smokeless tobacco: Never Used  Vaping Use  . Vaping Use: Never used  Substance and Sexual Activity  . Alcohol use: No  . Drug use: No  . Sexual activity: Never    Birth control/protection: Post-menopausal  Other Topics Concern  . Not on file  Social History Narrative  . Not on file   Social Determinants of Health   Financial Resource Strain:   .  Difficulty of Paying Living Expenses:   Food Insecurity:   . Worried About Charity fundraiser in the Last Year:   . Arboriculturist in the Last Year:   Transportation Needs:   . Film/video editor (Medical):   [name] Lack of Transportation (Non-Medical):   Physical Activity:   . Days of Exercise per Week:   . Minutes of Exercise per Session:   Stress:   . Feeling of Stress :  Social Connections:   . Frequency of Communication with Friends and Family:   . Frequency of Social Gatherings with Friends and Family:   . Attends Religious Services:   . Active Member of Clubs or Organizations:   . Attends Archivist Meetings:   [name] Marital Status:      Family History: The patient's family history includes Breast cancer in her maternal aunt; Diabetes in her father; Heart disease in her father.  ROS:   Please see the history of present illness.     All other systems reviewed and are negative.  EKGs/Labs/Other Studies Reviewed:    The following studies were reviewed today:   EKG: August 10, 2019: Normal sinus rhythm at 64.  Nonspecific ST and T wave abnormalities.  Recent Labs: 08/25/2018: ALT 11 08/10/2019: BUN 24; Creatinine, Ser 1.67; Hemoglobin 10.9; Platelets 200; Potassium 4.8; Sodium 141  Recent Lipid Panel    Component Value Date/Time   CHOL 154 02/28/2018 0607   TRIG 156 (H) 02/28/2018 0607   HDL 22 (L) 02/28/2018 0607   CHOLHDL 7.0 02/28/2018 0607   VLDL 31 02/28/2018 0607   LDLCALC 101 (H) 02/28/2018 0607    Physical Exam:    VS:  BP 134/78   Pulse 64   Ht 5' 1.5" (1.562 m)   Wt 171 lb (77.6 kg)   SpO2 97%   BMI 31.79 kg/m     Wt Readings from Last 3 Encounters:  08/10/19 171 lb (77.6 kg)  09/09/18 170 lb 12.8 oz (77.5 kg)  08/25/18 170 lb 9.6 oz (77.4 kg)     GEN: Elderly, somewhat frail female, no acute distress currently. HEENT: Normal NECK: No JVD; No carotid bruits LYMPHATICS: No lymphadenopathy CARDIAC: RRR, no murmurs, rubs,  gallops RESPIRATORY:  Clear to auscultation without rales, wheezing or rhonchi  ABDOMEN: Soft, non-tender, non-distended MUSCULOSKELETAL:  No edema; No deformity  SKIN: Warm and dry NEUROLOGIC:  Alert and oriented x 3 PSYCHIATRIC:  Normal affect   ASSESSMENT:    1. Unstable angina (HCC)    PLAN:    In order of problems listed above:  1. Unstable angina: This is fully presents with symptoms that are consistent with unstable angina.  She has shortness of breath and chest tightness that have been progressing for the past 6 months.  These episodes occur every time she does any sort of exertion.  Her symptoms have improved slightly when she was started on amlodipine.  I think that she needs to be referred for heart catheterization.  She is already on Plavix 75 mg a day. We discussed the risks, benefits, options of heart catheterization.  She understands and agrees to proceed.  2.  Hyperlipidemia: Continue current medications.  3.  Hypertension: Continue current medications.  Blood pressure seems to be well controlled.   Medication Adjustments/Labs and Tests Ordered: Current medicines are reviewed at length with the patient today.  Concerns regarding medicines are outlined above.  Orders Placed This Encounter  Procedures  . CBC  . Basic Metabolic Panel (BMET)  . EKG 12-Lead   Meds ordered this encounter  Medications  . isosorbide mononitrate (IMDUR) 30 MG 24 hr tablet    Sig: Take 1 tablet (30 mg total) by mouth daily.    Dispense:  30 tablet    Refill:  11  . nitroGLYCERIN (NITROSTAT) 0.4 MG SL tablet    Sig: Place 1 tablet (0.4 mg total) under the tongue every 5 (five) minutes as needed for chest pain.  Dispense:  25 tablet    Refill:  6    Patient Instructions  Medication Instructions:  Your physician has recommended you make the following change in your medication:  START Isosorbide (Imdur) 30 mg once daily USE Sublingual nitroglycerin under your tongue as  needed   *If you need a refill on your cardiac medications before your next appointment, please call your pharmacy*   Lab Work: TODAY - CBC, BMET If you have labs (blood work) drawn today and your tests are completely normal, you will receive your results only by: [name] MyChart Message (if you have MyChart) OR . A paper copy in the mail If you have any lab test that is abnormal or we need to change your treatment, we will call you to review the results.    Follow-Up: At [facility], you and your health needs are our priority.  As part of our continuing mission to provide you with exceptional heart care, we have created designated Provider Care Teams.  These Care Teams include your primary Cardiologist (physician) and Advanced Practice Providers (APPs -  Physician Assistants and Nurse Practitioners) who all work together to provide you with the care you need, when you need it.   Your next appointment:   2 month(s) on August 24  The format for your next appointment:   In Person  Provider:   Mertie Moores, MD   Testing/Procedures:    [name] OFFICE [place], [place] [address] Dept: [phone] Loc: [phone]  Nicole Meyer  08/10/2019  You are scheduled for a Cardiac Catheterization on Thursday, June 24 with Dr. Larae Grooms.  1. Please arrive at the [facility] (Main Entrance A) at [facility]: [address] [place], [place] [address] at 5:30 AM (This time is two hours before your procedure to ensure your preparation). Free valet parking service is available.   Special note: Every effort is made to have your procedure done on time. Please understand that emergencies sometimes delay scheduled procedures.  2. Diet: Do not eat solid foods after midnight.  The patient may have clear liquids until 5am upon the day of the procedure.  3. Labs: Done  Today  4. Medication instructions in preparation for your procedure:   Contrast Allergy: No   On the morning of your procedure, take your Plavix/Clopidogrel and any morning medicines NOT listed above.  You may use sips of water.  5. Plan for one night stay--bring personal belongings. 6. Bring a current list of your medications and current insurance cards. 7. You MUST have a responsible person to drive you home. 8. Someone MUST be with you the first 24 hours after you arrive home or your discharge will be delayed. 9. Please wear clothes that are easy to get on and off and wear slip-on shoes.  Thank you for allowing [place] to care for you!   -- [facility] Invasive Cardiovascular services        Signed, Mertie Moores, MD  08/10/2019 6:15 PM    [place]

## 2019-08-11 ENCOUNTER — Telehealth: Payer: Self-pay | Admitting: *Deleted

## 2019-08-11 NOTE — Telephone Encounter (Signed)
Pt contacted pre-catheterization scheduled at [facility] for: Thursday August 12, 2019 11 AM Verified arrival time and place: [facility]) at: 6 AM-pre-procedure hydration   No solid food after midnight prior to cath, clear liquids until 5 AM day of procedure.  Hold: Aleve until post procedure-GFR 29  Except hold medications AM meds can be  taken pre-cath with sips of water including: ASA 81 mg Plavix 75 mg  Confirmed patient has responsible adult to drive home post procedure and observe 24 hours after arriving home: yes  You are allowed ONE visitor in the waiting room during your procedure. Both you and your visitor must wear masks.      COVID-19 Pre-Screening Questions:  . In the past 7 to 10 days have you had a new cough, shortness of breath, headache, congestion, fever (100 or greater) unexplained body aches, new sore throat, or sudden loss of taste or sense of smell? no . In the past 7 to 10 days have you been around anyone with known Covid 19? no  Reviewed procedure/mask/visitor instructions, COVID-19 screening questions with patient.

## 2019-08-12 ENCOUNTER — Encounter (HOSPITAL_COMMUNITY)
Admission: AD | Disposition: A | Discharge status: Skilled Nursing Facility | Payer: Self-pay | Source: Home / Self Care | Attending: Thoracic Surgery (Cardiothoracic Vascular Surgery)

## 2019-08-12 ENCOUNTER — Encounter (HOSPITAL_COMMUNITY): Payer: Self-pay | Admitting: Interventional Cardiology

## 2019-08-12 ENCOUNTER — Inpatient Hospital Stay (HOSPITAL_COMMUNITY): Payer: Medicare PPO

## 2019-08-12 ENCOUNTER — Inpatient Hospital Stay (HOSPITAL_COMMUNITY)
Admission: AD | Admit: 2019-08-12 | Discharge: 2019-08-24 | DRG: 234 | Disposition: A | Discharge status: Skilled Nursing Facility | Payer: Medicare PPO | Attending: Thoracic Surgery (Cardiothoracic Vascular Surgery) | Admitting: Thoracic Surgery (Cardiothoracic Vascular Surgery)

## 2019-08-12 ENCOUNTER — Other Ambulatory Visit: Payer: Self-pay

## 2019-08-12 DIAGNOSIS — E669 Obesity, unspecified: Secondary | ICD-10-CM | POA: Diagnosis present

## 2019-08-12 DIAGNOSIS — E785 Hyperlipidemia, unspecified: Secondary | ICD-10-CM | POA: Diagnosis present

## 2019-08-12 DIAGNOSIS — J449 Chronic obstructive pulmonary disease, unspecified: Secondary | ICD-10-CM | POA: Diagnosis present

## 2019-08-12 DIAGNOSIS — Z20822 Contact with and (suspected) exposure to covid-19: Secondary | ICD-10-CM | POA: Diagnosis present

## 2019-08-12 DIAGNOSIS — R600 Localized edema: Secondary | ICD-10-CM | POA: Diagnosis not present

## 2019-08-12 DIAGNOSIS — Z803 Family history of malignant neoplasm of breast: Secondary | ICD-10-CM

## 2019-08-12 DIAGNOSIS — Z8673 Personal history of transient ischemic attack (TIA), and cerebral infarction without residual deficits: Secondary | ICD-10-CM | POA: Diagnosis not present

## 2019-08-12 DIAGNOSIS — M353 Polymyalgia rheumatica: Secondary | ICD-10-CM | POA: Diagnosis present

## 2019-08-12 DIAGNOSIS — R001 Bradycardia, unspecified: Secondary | ICD-10-CM | POA: Diagnosis not present

## 2019-08-12 DIAGNOSIS — Z7952 Long term (current) use of systemic steroids: Secondary | ICD-10-CM

## 2019-08-12 DIAGNOSIS — I10 Essential (primary) hypertension: Secondary | ICD-10-CM | POA: Diagnosis not present

## 2019-08-12 DIAGNOSIS — N183 Chronic kidney disease, stage 3 unspecified: Secondary | ICD-10-CM | POA: Diagnosis not present

## 2019-08-12 DIAGNOSIS — Z743 Need for continuous supervision: Secondary | ICD-10-CM | POA: Diagnosis not present

## 2019-08-12 DIAGNOSIS — R079 Chest pain, unspecified: Secondary | ICD-10-CM | POA: Diagnosis not present

## 2019-08-12 DIAGNOSIS — R110 Nausea: Secondary | ICD-10-CM | POA: Diagnosis not present

## 2019-08-12 DIAGNOSIS — Z01810 Encounter for preprocedural cardiovascular examination: Secondary | ICD-10-CM | POA: Diagnosis not present

## 2019-08-12 DIAGNOSIS — I2584 Coronary atherosclerosis due to calcified coronary lesion: Secondary | ICD-10-CM | POA: Diagnosis present

## 2019-08-12 DIAGNOSIS — M2550 Pain in unspecified joint: Secondary | ICD-10-CM | POA: Diagnosis not present

## 2019-08-12 DIAGNOSIS — Z791 Long term (current) use of non-steroidal anti-inflammatories (NSAID): Secondary | ICD-10-CM

## 2019-08-12 DIAGNOSIS — Z96651 Presence of right artificial knee joint: Secondary | ICD-10-CM | POA: Diagnosis present

## 2019-08-12 DIAGNOSIS — M6281 Muscle weakness (generalized): Secondary | ICD-10-CM | POA: Diagnosis not present

## 2019-08-12 DIAGNOSIS — N179 Acute kidney failure, unspecified: Secondary | ICD-10-CM | POA: Diagnosis not present

## 2019-08-12 DIAGNOSIS — I2510 Atherosclerotic heart disease of native coronary artery without angina pectoris: Secondary | ICD-10-CM | POA: Diagnosis not present

## 2019-08-12 DIAGNOSIS — I129 Hypertensive chronic kidney disease with stage 1 through stage 4 chronic kidney disease, or unspecified chronic kidney disease: Secondary | ICD-10-CM | POA: Diagnosis present

## 2019-08-12 DIAGNOSIS — I119 Hypertensive heart disease without heart failure: Secondary | ICD-10-CM | POA: Diagnosis not present

## 2019-08-12 DIAGNOSIS — K219 Gastro-esophageal reflux disease without esophagitis: Secondary | ICD-10-CM | POA: Diagnosis present

## 2019-08-12 DIAGNOSIS — Z7902 Long term (current) use of antithrombotics/antiplatelets: Secondary | ICD-10-CM

## 2019-08-12 DIAGNOSIS — I69398 Other sequelae of cerebral infarction: Secondary | ICD-10-CM | POA: Diagnosis not present

## 2019-08-12 DIAGNOSIS — J939 Pneumothorax, unspecified: Secondary | ICD-10-CM

## 2019-08-12 DIAGNOSIS — Z888 Allergy status to other drugs, medicaments and biological substances status: Secondary | ICD-10-CM | POA: Diagnosis not present

## 2019-08-12 DIAGNOSIS — I25110 Atherosclerotic heart disease of native coronary artery with unstable angina pectoris: Principal | ICD-10-CM

## 2019-08-12 DIAGNOSIS — I69328 Other speech and language deficits following cerebral infarction: Secondary | ICD-10-CM | POA: Diagnosis not present

## 2019-08-12 DIAGNOSIS — Z01818 Encounter for other preprocedural examination: Secondary | ICD-10-CM

## 2019-08-12 DIAGNOSIS — N1832 Chronic kidney disease, stage 3b: Secondary | ICD-10-CM | POA: Diagnosis present

## 2019-08-12 DIAGNOSIS — Z751 Person awaiting admission to adequate facility elsewhere: Secondary | ICD-10-CM

## 2019-08-12 DIAGNOSIS — Z7401 Bed confinement status: Secondary | ICD-10-CM | POA: Diagnosis not present

## 2019-08-12 DIAGNOSIS — Z823 Family history of stroke: Secondary | ICD-10-CM

## 2019-08-12 DIAGNOSIS — E8770 Fluid overload, unspecified: Secondary | ICD-10-CM | POA: Diagnosis not present

## 2019-08-12 DIAGNOSIS — Z90710 Acquired absence of both cervix and uterus: Secondary | ICD-10-CM

## 2019-08-12 DIAGNOSIS — E7800 Pure hypercholesterolemia, unspecified: Secondary | ICD-10-CM | POA: Diagnosis present

## 2019-08-12 DIAGNOSIS — I200 Unstable angina: Secondary | ICD-10-CM

## 2019-08-12 DIAGNOSIS — Z48812 Encounter for surgical aftercare following surgery on the circulatory system: Secondary | ICD-10-CM | POA: Diagnosis not present

## 2019-08-12 DIAGNOSIS — Z09 Encounter for follow-up examination after completed treatment for conditions other than malignant neoplasm: Secondary | ICD-10-CM

## 2019-08-12 DIAGNOSIS — Z85828 Personal history of other malignant neoplasm of skin: Secondary | ICD-10-CM | POA: Diagnosis not present

## 2019-08-12 DIAGNOSIS — Z87891 Personal history of nicotine dependence: Secondary | ICD-10-CM

## 2019-08-12 DIAGNOSIS — Z951 Presence of aortocoronary bypass graft: Secondary | ICD-10-CM

## 2019-08-12 DIAGNOSIS — I517 Cardiomegaly: Secondary | ICD-10-CM | POA: Diagnosis not present

## 2019-08-12 DIAGNOSIS — Z8249 Family history of ischemic heart disease and other diseases of the circulatory system: Secondary | ICD-10-CM

## 2019-08-12 DIAGNOSIS — D509 Iron deficiency anemia, unspecified: Secondary | ICD-10-CM | POA: Diagnosis present

## 2019-08-12 DIAGNOSIS — Z79899 Other long term (current) drug therapy: Secondary | ICD-10-CM

## 2019-08-12 DIAGNOSIS — D696 Thrombocytopenia, unspecified: Secondary | ICD-10-CM | POA: Diagnosis not present

## 2019-08-12 DIAGNOSIS — D649 Anemia, unspecified: Secondary | ICD-10-CM | POA: Diagnosis not present

## 2019-08-12 DIAGNOSIS — Z880 Allergy status to penicillin: Secondary | ICD-10-CM

## 2019-08-12 DIAGNOSIS — D62 Acute posthemorrhagic anemia: Secondary | ICD-10-CM | POA: Diagnosis not present

## 2019-08-12 DIAGNOSIS — R41841 Cognitive communication deficit: Secondary | ICD-10-CM | POA: Diagnosis not present

## 2019-08-12 DIAGNOSIS — J90 Pleural effusion, not elsewhere classified: Secondary | ICD-10-CM | POA: Diagnosis not present

## 2019-08-12 DIAGNOSIS — M810 Age-related osteoporosis without current pathological fracture: Secondary | ICD-10-CM | POA: Diagnosis present

## 2019-08-12 DIAGNOSIS — R0789 Other chest pain: Secondary | ICD-10-CM | POA: Diagnosis not present

## 2019-08-12 DIAGNOSIS — Z6832 Body mass index (BMI) 32.0-32.9, adult: Secondary | ICD-10-CM | POA: Diagnosis not present

## 2019-08-12 DIAGNOSIS — Z833 Family history of diabetes mellitus: Secondary | ICD-10-CM

## 2019-08-12 HISTORY — DX: Atherosclerotic heart disease of native coronary artery without angina pectoris: I25.10

## 2019-08-12 HISTORY — PX: CARDIAC CATHETERIZATION: SHX172

## 2019-08-12 HISTORY — PX: LEFT HEART CATH AND CORONARY ANGIOGRAPHY: CATH118249

## 2019-08-12 LAB — PLATELET INHIBITION P2Y12: Platelet Function  P2Y12: 327 [PRU] (ref 182–335)

## 2019-08-12 LAB — ECHOCARDIOGRAM COMPLETE
Height: 61.5 in
Weight: 2736 oz

## 2019-08-12 SURGERY — LEFT HEART CATH AND CORONARY ANGIOGRAPHY
Anesthesia: LOCAL

## 2019-08-12 MED ORDER — HEPARIN (PORCINE) IN NACL 1000-0.9 UT/500ML-% IV SOLN
INTRAVENOUS | Status: AC
  Filled 2019-08-12: qty 1000

## 2019-08-12 MED ORDER — METOPROLOL SUCCINATE ER 25 MG PO TB24
25.0000 mg | ORAL_TABLET | Freq: Every day | ORAL | Status: DC
  Administered 2019-08-13 – 2019-08-16 (×4): 25 mg via ORAL
  Filled 2019-08-12 (×4): qty 1

## 2019-08-12 MED ORDER — NAPROXEN SODIUM 220 MG PO CAPS: 440.0000 mg | ORAL_CAPSULE | Freq: Every day | ORAL | Status: DC

## 2019-08-12 MED ORDER — ONDANSETRON HCL 4 MG/2ML IJ SOLN
4.0000 mg | Freq: Four times a day (QID) | INTRAMUSCULAR | Status: DC | PRN
  Administered 2019-08-13: 4 mg via INTRAVENOUS
  Filled 2019-08-12: qty 2

## 2019-08-12 MED ORDER — SODIUM CHLORIDE 0.9 % IV SOLN: INTRAVENOUS | Status: AC

## 2019-08-12 MED ORDER — SODIUM CHLORIDE 0.9 % IV SOLN: 250.0000 mL | INTRAVENOUS | Status: DC | PRN

## 2019-08-12 MED ORDER — SODIUM CHLORIDE 0.9% FLUSH: 3.0000 mL | INTRAVENOUS | Status: DC | PRN

## 2019-08-12 MED ORDER — FOLIC ACID 400 MCG PO TABS: 400.0000 ug | ORAL_TABLET | Freq: Every day | ORAL | Status: DC

## 2019-08-12 MED ORDER — LIDOCAINE HCL (PF) 1 % IJ SOLN
INTRAMUSCULAR | Status: AC
  Filled 2019-08-12: qty 30

## 2019-08-12 MED ORDER — FENTANYL CITRATE (PF) 100 MCG/2ML IJ SOLN
INTRAMUSCULAR | Status: DC | PRN
  Administered 2019-08-12: 25 ug via INTRAVENOUS

## 2019-08-12 MED ORDER — MIDAZOLAM HCL 2 MG/2ML IJ SOLN
INTRAMUSCULAR | Status: DC | PRN
  Administered 2019-08-12: 1 mg via INTRAVENOUS

## 2019-08-12 MED ORDER — SODIUM CHLORIDE 0.9% FLUSH
3.0000 mL | Freq: Two times a day (BID) | INTRAVENOUS | Status: DC
  Administered 2019-08-13 – 2019-08-16 (×5): 3 mL via INTRAVENOUS

## 2019-08-12 MED ORDER — AMLODIPINE BESYLATE 5 MG PO TABS
5.0000 mg | ORAL_TABLET | Freq: Every day | ORAL | Status: DC
  Administered 2019-08-13 – 2019-08-16 (×4): 5 mg via ORAL
  Filled 2019-08-12 (×4): qty 1

## 2019-08-12 MED ORDER — VERAPAMIL HCL 2.5 MG/ML IV SOLN
INTRAVENOUS | Status: AC
  Filled 2019-08-12: qty 2

## 2019-08-12 MED ORDER — FENTANYL CITRATE (PF) 100 MCG/2ML IJ SOLN
INTRAMUSCULAR | Status: AC
  Filled 2019-08-12: qty 2

## 2019-08-12 MED ORDER — PREDNISONE 5 MG PO TABS
5.0000 mg | ORAL_TABLET | Freq: Every day | ORAL | Status: DC
  Administered 2019-08-13 – 2019-08-16 (×4): 5 mg via ORAL
  Filled 2019-08-12 (×4): qty 1

## 2019-08-12 MED ORDER — HEPARIN SODIUM (PORCINE) 1000 UNIT/ML IJ SOLN
INTRAMUSCULAR | Status: DC | PRN
  Administered 2019-08-12: 3500 [IU] via INTRAVENOUS

## 2019-08-12 MED ORDER — ASPIRIN 81 MG PO CHEW
81.0000 mg | CHEWABLE_TABLET | Freq: Every day | ORAL | Status: DC
  Administered 2019-08-13 – 2019-08-16 (×4): 81 mg via ORAL
  Filled 2019-08-12 (×4): qty 1

## 2019-08-12 MED ORDER — SODIUM CHLORIDE 0.9% FLUSH: 3.0000 mL | Freq: Two times a day (BID) | INTRAVENOUS | Status: DC

## 2019-08-12 MED ORDER — PANTOPRAZOLE SODIUM 40 MG PO TBEC
40.0000 mg | DELAYED_RELEASE_TABLET | Freq: Every day | ORAL | Status: DC
  Administered 2019-08-13 – 2019-08-16 (×4): 40 mg via ORAL
  Filled 2019-08-12 (×4): qty 1

## 2019-08-12 MED ORDER — MUSCLE RUB 10-15 % EX CREA
TOPICAL_CREAM | Freq: Every day | CUTANEOUS | Status: DC | PRN
  Filled 2019-08-12: qty 85

## 2019-08-12 MED ORDER — ISOSORBIDE MONONITRATE ER 30 MG PO TB24
30.0000 mg | ORAL_TABLET | Freq: Every day | ORAL | Status: DC
  Administered 2019-08-13 – 2019-08-16 (×4): 30 mg via ORAL
  Filled 2019-08-12 (×4): qty 1

## 2019-08-12 MED ORDER — ASPIRIN 81 MG PO CHEW: 81.0000 mg | CHEWABLE_TABLET | ORAL | Status: DC

## 2019-08-12 MED ORDER — LIDOCAINE HCL (PF) 1 % IJ SOLN
INTRAMUSCULAR | Status: DC | PRN
  Administered 2019-08-12: 2 mL

## 2019-08-12 MED ORDER — HYDRALAZINE HCL 20 MG/ML IJ SOLN: 10.0000 mg | INTRAMUSCULAR | Status: AC | PRN

## 2019-08-12 MED ORDER — SODIUM CHLORIDE 0.9 % WEIGHT BASED INFUSION
3.0000 mL/kg/h | INTRAVENOUS | Status: DC
  Administered 2019-08-12: 3 mL/kg/h via INTRAVENOUS

## 2019-08-12 MED ORDER — HEPARIN (PORCINE) IN NACL 1000-0.9 UT/500ML-% IV SOLN
INTRAVENOUS | Status: DC | PRN
  Administered 2019-08-12 (×2): 500 mL

## 2019-08-12 MED ORDER — ROSUVASTATIN CALCIUM 20 MG PO TABS
20.0000 mg | ORAL_TABLET | Freq: Every day | ORAL | Status: DC
  Administered 2019-08-13 – 2019-08-24 (×11): 20 mg via ORAL
  Filled 2019-08-12 (×11): qty 1

## 2019-08-12 MED ORDER — NITROGLYCERIN 0.4 MG SL SUBL: 0.4000 mg | SUBLINGUAL_TABLET | SUBLINGUAL | Status: DC | PRN

## 2019-08-12 MED ORDER — VERAPAMIL HCL 2.5 MG/ML IV SOLN
INTRAVENOUS | Status: DC | PRN
  Administered 2019-08-12: 10 mL via INTRA_ARTERIAL

## 2019-08-12 MED ORDER — ALBUTEROL SULFATE (2.5 MG/3ML) 0.083% IN NEBU: 3.0000 mL | INHALATION_SOLUTION | Freq: Four times a day (QID) | RESPIRATORY_TRACT | Status: DC | PRN

## 2019-08-12 MED ORDER — HEPARIN SODIUM (PORCINE) 1000 UNIT/ML IJ SOLN
INTRAMUSCULAR | Status: AC
  Filled 2019-08-12: qty 1

## 2019-08-12 MED ORDER — MIDAZOLAM HCL 2 MG/2ML IJ SOLN
INTRAMUSCULAR | Status: AC
  Filled 2019-08-12: qty 2

## 2019-08-12 MED ORDER — SODIUM CHLORIDE 0.9 % WEIGHT BASED INFUSION
1.0000 mL/kg/h | INTRAVENOUS | Status: DC
  Administered 2019-08-12: 1 mL/kg/h via INTRAVENOUS

## 2019-08-12 MED ORDER — NAPROXEN 250 MG PO TABS
500.0000 mg | ORAL_TABLET | Freq: Every day | ORAL | Status: DC
  Filled 2019-08-12: qty 2

## 2019-08-12 MED ORDER — ACETAMINOPHEN 325 MG PO TABS: 650.0000 mg | ORAL_TABLET | ORAL | Status: DC | PRN

## 2019-08-12 MED ORDER — MENTHOL (TOPICAL ANALGESIC) 7.5 % (ROLL) EX MISC: 1.0000 | Freq: Every day | CUTANEOUS | Status: DC | PRN

## 2019-08-12 MED ORDER — LABETALOL HCL 5 MG/ML IV SOLN: 10.0000 mg | INTRAVENOUS | Status: AC | PRN

## 2019-08-12 SURGICAL SUPPLY — 10 items
CATH 5FR JL3.5 JR4 ANG PIG MP (CATHETERS) ×2 IMPLANT
DEVICE RAD COMP TR BAND LRG (VASCULAR PRODUCTS) ×2 IMPLANT
GLIDESHEATH SLEND SS 6F .021 (SHEATH) ×2 IMPLANT
GUIDEWIRE INQWIRE 1.5J.035X260 (WIRE) ×1 IMPLANT
INQWIRE 1.5J .035X260CM (WIRE) ×2
KIT HEART LEFT (KITS) ×2 IMPLANT
PACK CARDIAC CATHETERIZATION (CUSTOM PROCEDURE TRAY) ×2 IMPLANT
SHEATH PROBE COVER 6X72 (BAG) ×2 IMPLANT
TRANSDUCER W/STOPCOCK (MISCELLANEOUS) ×2 IMPLANT
TUBING CIL FLEX 10 FLL-RA (TUBING) ×2 IMPLANT

## 2019-08-12 NOTE — Interval H&P Note (Signed)
Cath Lab Visit (complete for each Cath Lab visit)  Clinical Evaluation Leading to the Procedure:   ACS: Yes.    Non-ACS:    Anginal Classification: CCS IV  Anti-ischemic medical therapy: Minimal Therapy (1 class of medications)  Non-Invasive Test Results: No non-invasive testing performed  Prior CABG: No previous CABG      History and Physical Interval Note:  08/12/2019 11:39 AM  Nicole Meyer  has presented today for surgery, with the diagnosis of unstable angina.  The various methods of treatment have been discussed with the patient and family. After consideration of risks, benefits and other options for treatment, the patient has consented to  Procedure(s): LEFT HEART CATH AND CORONARY ANGIOGRAPHY (N/A) as a surgical intervention.  The patient's history has been reviewed, patient examined, no change in status, stable for surgery.  I have reviewed the patient's chart and labs.  Questions were answered to the patient's satisfaction.     Larae Grooms

## 2019-08-12 NOTE — Progress Notes (Signed)
TR BAND REMOVAL  LOCATION:    right radial  DEFLATED PER PROTOCOL:    Yes.    TIME BAND OFF / DRESSING APPLIED:    1600   SITE UPON ARRIVAL:    Level 0  SITE AFTER BAND REMOVAL:    Level 1, bruising proximal to site  CIRCULATION SENSATION AND MOVEMENT:    Within Normal Limits   Yes.    COMMENTS:   Rechecked at 1630 with no change

## 2019-08-12 NOTE — Progress Notes (Signed)
  Echocardiogram 2D Echocardiogram has been performed.  Nicole Meyer 08/12/2019, 3:47 PM

## 2019-08-12 NOTE — Research (Signed)
CADFEM2 Informed Consent   Subject Name: Nicole Meyer  Subject met inclusion and exclusion criteria.  The informed consent form, study requirements and expectations were reviewed with the subject and questions and concerns were addressed prior to the signing of the consent form.  The subject verbalized understanding of the trail requirements.  The subject agreed to participate in the CADFEM2 trial and signed the informed consent.  The informed consent was obtained prior to performance of any protocol-specific procedures for the subject.  A copy of the signed informed consent was given to the subject and a copy was placed in the subject's medical record.  Philemon Kingdom D 08/12/2019, 8:32 AM

## 2019-08-12 NOTE — Progress Notes (Signed)
     [address]       [place] [address]             [phone]       Full consult note to follow. 79 year old female with unstable angina status post left heart cath which showed severe three-vessel disease.  Targets are good for surgical revascularization. Patient will require Plavix washout, and is tentatively scheduled for surgery early next week We will obtain a P2 Y 12 in case she develops chest pain over the weekend.

## 2019-08-13 ENCOUNTER — Encounter (HOSPITAL_COMMUNITY): Payer: Self-pay | Admitting: Interventional Cardiology

## 2019-08-13 LAB — BASIC METABOLIC PANEL
Anion gap: 9 (ref 5–15)
BUN: 15 mg/dL (ref 8–23)
CO2: 25 mmol/L (ref 22–32)
Calcium: 9.1 mg/dL (ref 8.9–10.3)
Chloride: 108 mmol/L (ref 98–111)
Creatinine, Ser: 1.44 mg/dL — ABNORMAL HIGH (ref 0.44–1.00)
GFR calc Af Amer: 40 mL/min — ABNORMAL LOW (ref >60)
GFR calc non Af Amer: 34 mL/min — ABNORMAL LOW (ref >60)
Glucose, Bld: 108 mg/dL — ABNORMAL HIGH (ref 70–99)
Potassium: 4.2 mmol/L (ref 3.5–5.1)
Sodium: 142 mmol/L (ref 135–145)

## 2019-08-13 LAB — CBC
HCT: 33.3 % — ABNORMAL LOW (ref 36.0–46.0)
Hemoglobin: 10.6 g/dL — ABNORMAL LOW (ref 12.0–15.0)
MCH: 31.2 pg (ref 26.0–34.0)
MCHC: 31.8 g/dL (ref 30.0–36.0)
MCV: 97.9 fL (ref 80.0–100.0)
Platelets: 162 10*3/uL (ref 150–400)
RBC: 3.4 MIL/uL — ABNORMAL LOW (ref 3.87–5.11)
RDW: 12.8 % (ref 11.5–15.5)
WBC: 4.1 10*3/uL (ref 4.0–10.5)
nRBC: 0 % (ref 0.0–0.2)

## 2019-08-13 MED ORDER — ALUM & MAG HYDROXIDE-SIMETH 200-200-20 MG/5ML PO SUSP
30.0000 mL | Freq: Four times a day (QID) | ORAL | Status: DC | PRN
  Administered 2019-08-13: 30 mL via ORAL
  Filled 2019-08-13: qty 30

## 2019-08-13 NOTE — Progress Notes (Signed)
Patient complained of heartburn symptoms this a.m, Maalox 30 mL administered. Patient complained shortly after of nausea, IV Zofran administered. Patient is now resting comfortably in bed stating that symptoms have subsided. Will continue to monitor.   Elaina Hoops, RN

## 2019-08-13 NOTE — Progress Notes (Signed)
Progress Note  Patient Name: Nicole Meyer Date of Encounter: 08/13/2019  [facility] Cardiologist: Mertie Moores, MD   Subjective   Pt denies chest pain and SOB.  Inpatient Medications    Scheduled Meds: . amLODipine  5 mg Oral Daily  . aspirin  81 mg Oral Daily  . isosorbide mononitrate  30 mg Oral Daily  . metoprolol succinate  25 mg Oral Daily  . naproxen  500 mg Oral Q breakfast  . pantoprazole  40 mg Oral Daily  . predniSONE  5 mg Oral QPC breakfast  . rosuvastatin  20 mg Oral Daily  . sodium chloride flush  3 mL Intravenous Q12H   Continuous Infusions: . sodium chloride     PRN Meds: sodium chloride, acetaminophen, albuterol, alum & mag hydroxide-simeth, Muscle Rub, nitroGLYCERIN, ondansetron (ZOFRAN) IV, sodium chloride flush   Vital Signs    Vitals:   08/12/19 1400 08/12/19 2032 08/12/19 2346 08/13/19 0639  BP: 136/65 (!) 143/88 140/86 (!) 158/64  Pulse: 60 (!) 55 62 63  Resp: 16 14 19 17   Temp: 97.8 F (36.6 C) 98.4 F (36.9 C) 98 F (36.7 C) 97.6 F (36.4 C)  TempSrc: Oral Oral Oral Oral  SpO2:  97% 98% 98%  Weight:    76.1 kg  Height:       No intake or output data in the 24 hours ending 08/13/19 0845 Last 3 Weights 08/13/2019 08/12/2019 08/10/2019  Weight (lbs) 167 lb 12.8 oz 171 lb 171 lb  Weight (kg) 76.114 kg 77.565 kg 77.565 kg      Telemetry    Sinus to sinus bradycardia - Personally Reviewed  ECG    No new tracings - Personally Reviewed  Physical Exam   Exam per MD  Labs    High Sensitivity Troponin:  No results for input(s): TROPONINIHS in the last 720 hours.    Chemistry Recent Labs  Lab 08/10/19 1501  NA 141  K 4.8  CL 105  CO2 26  GLUCOSE 142*  BUN 24  CREATININE 1.67*  CALCIUM 10.3  GFRNONAA 29*  GFRAA 33*     Hematology Recent Labs  Lab 08/10/19 1501  WBC 7.6  RBC 3.57*  HGB 10.9*  HCT 33.5*  MCV 94  MCH 30.5  MCHC 32.5  RDW 13.2  PLT 200    BNPNo results for input(s): BNP, PROBNP in  the last 168 hours.   DDimer No results for input(s): DDIMER in the last 168 hours.   Radiology    CARDIAC CATHETERIZATION  Result Date: 08/12/2019  Prox Cx lesion is 70% stenosed.  Mid LAD lesion is 75% stenosed.  2nd Diag lesion is 95% stenosed.  Prox RCA lesion is 80% stenosed.  Mid RCA lesion is 80% stenosed.  LV end diastolic pressure is normal.  There is no aortic valve stenosis.  Three vessel CAD. Cardiac surgery consult.  Plan to admit since she is having unstable/ accelerating symptoms.  Will need to consider adding IV heparin if radial site is ok, and as plavix wears off.  Will check echo and hydrate post procedure.   ECHOCARDIOGRAM COMPLETE  Result Date: 08/12/2019    ECHOCARDIOGRAM REPORT   Patient Name:   CARRI SPILLERS Date of Exam: 08/12/2019 Medical Rec #:  [id number]         Height:       61.5 in Accession #:    [id number]        Weight:  171.0 lb Date of Birth:  May 23, 1940          BSA:          1.778 m Patient Age:    79 years          BP:           136/55 mmHg Patient Gender: F                 HR:           51 bpm. Exam Location:  Inpatient Procedure: 2D Echo Indications:    CAD 414. 01  History:        Patient has prior history of Echocardiogram examinations, most                 recent 02/28/2018. CAD, COPD; Risk Factors:Hypertension,                 Dyslipidemia and Former Smoker.  Sonographer:    Jannett Celestine RDCS (AE) Referring Phys: [place]  1. Left ventricular ejection fraction, by estimation, is 60 to 65%. The left ventricle has normal function. The left ventricle has no regional wall motion abnormalities. There is mild left ventricular hypertrophy. Left ventricular diastolic parameters are consistent with Grade I diastolic dysfunction (impaired relaxation).  2. Right ventricular systolic function is normal. The right ventricular size is normal.  3. The mitral valve is abnormal. Moderate mitral annular calcification. No evidence of  mitral valve regurgitation.  4. The aortic valve was not well visualized. Aortic valve regurgitation is not visualized. No aortic stenosis is present.  5. The inferior vena cava is normal in size with greater than 50% respiratory variability, suggesting right atrial pressure of 3 mmHg. FINDINGS  Left Ventricle: Left ventricular ejection fraction, by estimation, is 60 to 65%. The left ventricle has normal function. The left ventricle has no regional wall motion abnormalities. The left ventricular internal cavity size was normal in size. There is  mild left ventricular hypertrophy. Left ventricular diastolic parameters are consistent with Grade I diastolic dysfunction (impaired relaxation). Right Ventricle: The right ventricular size is normal. Right vetricular wall thickness was not assessed. Right ventricular systolic function is normal. Left Atrium: Left atrial size was normal in size. Right Atrium: Right atrial size was normal in size. Pericardium: There is no evidence of pericardial effusion. Mitral Valve: The mitral valve is abnormal. Moderate mitral annular calcification. No evidence of mitral valve regurgitation. Tricuspid Valve: The tricuspid valve is normal in structure. Tricuspid valve regurgitation is trivial. Aortic Valve: The aortic valve was not well visualized. Aortic valve regurgitation is not visualized. No aortic stenosis is present. Pulmonic Valve: The pulmonic valve was not well visualized. Pulmonic valve regurgitation is not visualized. Aorta: The aortic root is normal in size and structure. Venous: The inferior vena cava is normal in size with greater than 50% respiratory variability, suggesting right atrial pressure of 3 mmHg. IAS/Shunts: The interatrial septum was not well visualized.  LEFT VENTRICLE PLAX 2D LVIDd:         4.50 cm  Diastology LVIDs:         3.00 cm  LV e' lateral:   8.38 cm/s LV PW:         1.00 cm  LV E/e' lateral: 9.2 LV IVS:        1.00 cm  LV e' medial:    6.20 cm/s LVOT  diam:     2.00 cm  LV E/e' medial:  12.4  LV SV:         57 LV SV Index:   32 LVOT Area:     3.14 cm  RIGHT VENTRICLE TAPSE (M-mode): 2.0 cm LEFT ATRIUM             Index       RIGHT ATRIUM           Index LA diam:        4.20 cm 2.36 cm/m  RA Area:     12.50 cm LA Vol (A2C):   64.2 ml 36.11 ml/m RA Volume:   23.20 ml  13.05 ml/m LA Vol (A4C):   33.2 ml 18.67 ml/m LA Biplane Vol: 51.1 ml 28.74 ml/m  AORTIC VALVE LVOT Vmax:   89.70 cm/s LVOT Vmean:  62.300 cm/s LVOT VTI:    0.183 m  AORTA Ao Root diam: 3.30 cm MITRAL VALVE MV Area (PHT): 2.07 cm     SHUNTS MV Decel Time: 366 msec     Systemic VTI:  0.18 m MV E velocity: 77.10 cm/s   Systemic Diam: 2.00 cm MV A velocity: 112.00 cm/s MV E/A ratio:  0.69 Oswaldo Milian MD Electronically signed by Oswaldo Milian MD Signature Date/Time: 08/12/2019/8:03:37 PM    Final     Cardiac Studies   Echo 08/12/19: 1. Left ventricular ejection fraction, by estimation, is 60 to 65%. The  left ventricle has normal function. The left ventricle has no regional  wall motion abnormalities. There is mild left ventricular hypertrophy.  Left ventricular diastolic parameters  are consistent with Grade I diastolic dysfunction (impaired relaxation).  2. Right ventricular systolic function is normal. The right ventricular  size is normal.  3. The mitral valve is abnormal. Moderate mitral annular calcification.  No evidence of mitral valve regurgitation.  4. The aortic valve was not well visualized. Aortic valve regurgitation  is not visualized. No aortic stenosis is present.  5. The inferior vena cava is normal in size with greater than 50%  respiratory variability, suggesting right atrial pressure of 3 mmHg.  Left heart cath 08/12/19:  Prox Cx lesion is 70% stenosed.  Mid LAD lesion is 75% stenosed.  2nd Diag lesion is 95% stenosed.  Prox RCA lesion is 80% stenosed.  Mid RCA lesion is 80% stenosed.  LV end diastolic pressure is  normal.  There is no aortic valve stenosis.   Three vessel CAD. Cardiac surgery consult.  Plan to admit since she is having unstable/ accelerating symptoms.  Will need to consider adding IV heparin if radial site is ok, and as plavix wears off.    Will check echo and hydrate post procedure.   Patient Profile     79 y.o. female with a history of CAD, HTN, HLD, and past CVA on plavix who was found to have 3-v disease.   Assessment & Plan    CAD - 3 vessel disease - CT surgery consulted and will plan revascularization next week after plavix washout - continue 30 mg imdur, 5 mg amlodipine, and BB - continue ASA, holding plavix - question need for heparin gtt - per Dr. Debara Pickett   Hypertension - medications as above - pressure elevated - consider increasing amlodipine to 10 mg   Anemia - Hb 10.9 --> labs pending today - no active bleeding   Hyperlipidemia - will update lipid profile - pt on 20 mg crestor   Hx of CVA - plavix held for CABG - continue ASA   A on CKD - sCr 1.67  yesterday - will order labs for today - baseline appears to be 1.3    For questions or updates, please contact [place] Please consult [email or web address] for contact info under        Signed, Ledora Bottcher, PA  08/13/2019, 8:45 AM

## 2019-08-13 NOTE — Consult Note (Signed)
[address]       [place] [address]             [id number]        Akeela A Broerman Parker Medical Record #[id number] Date of Birth: 07/14/1940  Referring: No ref. provider found Primary Care: [name], MD Primary Cardiologist:Philip Nahser, MD  Chief Complaint:   No chief complaint on file.   History of Present Illness:     This is a 79 year old female who presents for an elective left heart cath.  She has had a several month history of exertional angina and a history of a cerebrovascular events in January 2020.  Her anginal symptoms have recently worsened over the past few months.  She underwent a left heart cath on August 12, 2019 which showed three-vessel coronary disease.  Cardiothoracic surgery was consulted to assist with management.  She currently denies any chest pain, but has some nausea from what she thinks is warranted medications.   Past Medical and Surgical History: Previous Chest Surgery: No Previous Chest Radiation: No Diabetes Mellitus: No.  HbA1C 5.2 Creatinine: 1.44  Past Medical History:  Diagnosis Date  . Anxiety   . Anxiety   . Arthritis    "knees and back" (02/12/2013)  . Arthritis   . Asthma    no problems recently  . Blood dyscrasia    sperocytosis  . Cancer ([facility]) 04-06-12   skin cancer nasal bridge-no problems now  . Chronic kidney disease (CKD), stage III (moderate)   . COPD (chronic obstructive pulmonary disease) ([place])   . Coronary artery disease   . Depression   . Depression   . Diverticulitis   . Fatigue   . GERD (gastroesophageal reflux disease)   . Gout 04/05/2013  . H/O hiatal hernia   . History of blood transfusion    "2 units in 12/2012; suppose to get 1 unit today" (02/12/2013)  . HTN (hypertension)   . Hyperbilirubinemia 05/03/2013  . Hypercalcemia   . Hypercholesteremia    "above borderline; I don't take RX for it" (12/26/2011)  . IBS (irritable bowel syndrome)   . IBS (irritable bowel  syndrome)    ibs  . Iron deficiency anemia    [facility]  . Leukocytopenia   . Lumbar spinal stenosis   . Lymphopenia 08/23/2011  . Osteoarthritis   . Osteoporosis   . PMR (polymyalgia rheumatica) ([facility])    pmr  . Polymyalgia ([place])   . Renal insufficiency   . Syncope and collapse 12/24/2011   "loss of consciousness for 3-4 min" (12/26/2011)  . UTI (urinary tract infection) 12/26/2011    Past Surgical History:  Procedure Laterality Date  . APPENDECTOMY  79  . arthroscopic knee surgery-left    . BREAST EXCISIONAL BIOPSY Right 1995  . BREAST SURGERY  04-06-12   rt. lumpectomy_benign  . CARDIAC CATHETERIZATION  08/12/2019  . carpal tunnel right    . COLONOSCOPY, ESOPHAGOGASTRODUODENOSCOPY (EGD) AND ESOPHAGEAL DILATION     06/2013  . ESOPHAGOGASTRODUODENOSCOPY  12/26/2011   Procedure: ESOPHAGOGASTRODUODENOSCOPY (EGD);  Surgeon: Inda Castle, MD;  Location: [facility];  Service: Endoscopy;  Laterality: N/A;  . EYE SURGERY     cataracts  . HERNIA REPAIR  1970's   "stomach"   . LAPAROSCOPIC NISSEN FUNDOPLICATION N/A 3/66/4403   Procedure: Laparoscopic Reduction and Repair of Paraesophagel Hiatal Hernia with Nissen;  Surgeon: Adin Hector, MD;  Location: [facility];  Service: General;  Laterality: N/A;  Laparoscopic Reduction and Repair of Paraesophagel Hiatal Hernia with Nissen  . LEFT HEART CATH AND CORONARY ANGIOGRAPHY N/A 08/12/2019   Procedure: LEFT HEART CATH AND CORONARY ANGIOGRAPHY;  Surgeon: Jettie Booze, MD;  Location: [facility];  Service: Cardiovascular;  Laterality: N/A;  . NISSEN FUNDOPLICATION     09/6576  . TOTAL KNEE ARTHROPLASTY Right 05/19/2015  . TOTAL KNEE ARTHROPLASTY Right 05/19/2015   Procedure: RIGHT TOTAL KNEE ARTHROPLASTY;  Surgeon: Netta Cedars, MD;  Location: [facility];  Service: Orthopedics;  Laterality: Right;  . TUBAL LIGATION  1970's  . VAGINAL HYSTERECTOMY  ~ 1980   right ovary    Social History: Support: Lives  with her husband  Social History   Tobacco Use  Smoking Status Former Smoker  . Packs/day: 1.00  . Years: 40.00  . Pack years: 40.00  . Types: Cigarettes  . Quit date: 02/19/2000  . Years since quitting: 19.4  Smokeless Tobacco Never Used    Social History   Substance and Sexual Activity  Alcohol Use No     Allergies  Allergen Reactions  . Penicillins Hives    Last reaction was at 34 Has patient had a PCN reaction causing immediate rash, facial/tongue/throat swelling, SOB or lightheadedness with hypotension: Yes Has patient had a PCN reaction causing severe rash involving mucus membranes or skin necrosis: No Has patient had a PCN reaction that required hospitalization No Has patient had a PCN reaction occurring within the last 10 years: No If all of the above answers are "NO", then may proceed with Cephalosporin use.   Garlon Hatchet [Arformoterol] Other (See Comments)    "makes me nervous"  . Paxil [Paroxetine Hcl] Rash    Medications: Asprin: Yes Statin: Yes Beta Blocker: Yes Ace Inhibitor: No Anti-Coagulation: Plavix.  Last dose was 08/12/2019.  P2 Y 12 is over 300  Current Facility-Administered Medications  Medication Dose Route Frequency Provider Last Rate Last Admin  . 0.9 %  sodium chloride infusion  250 mL Intravenous PRN Jettie Booze, MD      . acetaminophen (TYLENOL) tablet 650 mg  650 mg Oral Q4H PRN Jettie Booze, MD      . albuterol (PROVENTIL) (2.5 MG/3ML) 0.083% nebulizer solution 3 mL  3 mL Inhalation Q6H PRN Jettie Booze, MD      . alum & mag hydroxide-simeth (MAALOX/MYLANTA) 200-200-20 MG/5ML suspension 30 mL  30 mL Oral Q6H PRN Jettie Booze, MD   30 mL at 08/13/19 0427  . amLODipine (NORVASC) tablet 5 mg  5 mg Oral Daily Jettie Booze, MD   5 mg at 08/13/19 1049  . aspirin chewable tablet 81 mg  81 mg Oral Daily Jettie Booze, MD   81 mg at 08/13/19 1049  . isosorbide mononitrate (IMDUR) 24 hr tablet 30 mg  30  mg Oral Daily Jettie Booze, MD   30 mg at 08/13/19 1048  . metoprolol succinate (TOPROL-XL) 24 hr tablet 25 mg  25 mg Oral Daily Jettie Booze, MD   25 mg at 08/13/19 1049  . Muscle Rub CREA   Topical Daily PRN Jettie Booze, MD      . nitroGLYCERIN (NITROSTAT) SL tablet 0.4 mg  0.4 mg Sublingual Q5 min PRN Jettie Booze, MD      . ondansetron [facility]) injection 4 mg  4 mg Intravenous Q6H PRN Jettie Booze, MD   4 mg at 08/13/19 0521  . pantoprazole (PROTONIX) EC  tablet 40 mg  40 mg Oral Daily Jettie Booze, MD   40 mg at 08/13/19 1048  . predniSONE (DELTASONE) tablet 5 mg  5 mg Oral QPC breakfast Jettie Booze, MD   5 mg at 08/13/19 1048  . rosuvastatin (CRESTOR) tablet 20 mg  20 mg Oral Daily Jettie Booze, MD   20 mg at 08/13/19 1048  . sodium chloride flush (NS) 0.9 % injection 3 mL  3 mL Intravenous Q12H Larae Grooms S, MD      . sodium chloride flush (NS) 0.9 % injection 3 mL  3 mL Intravenous PRN Jettie Booze, MD        Medications Prior to Admission  Medication Sig Dispense Refill Last Dose  . amLODipine (NORVASC) 5 MG tablet Take 5 mg by mouth daily.    08/12/2019 at 0430  . Calcium Carb-Cholecalciferol (CALCIUM 600 + D PO) Take 2 tablets by mouth daily after breakfast.   08/12/2019 at 0430  . clopidogrel (PLAVIX) 75 MG tablet Take 1 tablet (75 mg total) by mouth daily. 30 tablet 0 08/12/2019 at 0430  . cyanocobalamin 500 MCG tablet Take 500 mcg by mouth daily after breakfast. Vitamin B12   08/12/2019 at 0430  . Ferrous Sulfate (IRON PO) Take 65 mg by mouth every Monday, Wednesday, and Friday.    Past Week at Unknown time  . folic acid (FOLVITE) 578 MCG tablet Take 400 mcg by mouth daily after breakfast.    08/12/2019 at 0430  . isosorbide mononitrate (IMDUR) 30 MG 24 hr tablet Take 1 tablet (30 mg total) by mouth daily. 30 tablet 11 08/12/2019 at 0430  . Menthol, Topical Analgesic, (ICY HOT) 7.5 % (Roll) MISC Apply 1 each  topically daily as needed (arthritis pain).   08/12/2019 at 0430  . metoprolol succinate (TOPROL-XL) 25 MG 24 hr tablet Take 25 mg by mouth daily.    08/12/2019 at 0430  . Multiple Vitamins-Minerals (OCUVITE PO) Take 1 tablet by mouth daily after breakfast.   08/12/2019 at 0430  . Naproxen Sodium (ALEVE) 220 MG CAPS Take 440 mg by mouth daily.    Past Week at Unknown time  . nitroGLYCERIN (NITROSTAT) 0.4 MG SL tablet Place 1 tablet (0.4 mg total) under the tongue every 5 (five) minutes as needed for chest pain. 25 tablet 6   . omeprazole (PRILOSEC) 20 MG capsule Take 1 capsule (20 mg total) by mouth daily. 30 capsule 0 08/12/2019 at 0430  . predniSONE (DELTASONE) 5 MG tablet Take 1 tablet (5 mg total) by mouth daily after breakfast. 30 tablet 0 08/12/2019 at 0430  . rosuvastatin (CRESTOR) 20 MG tablet Take 1 tablet (20 mg total) by mouth daily. 30 tablet 3 08/12/2019 at 0430  . albuterol (PROVENTIL HFA;VENTOLIN HFA) 108 (90 Base) MCG/ACT inhaler Inhale 2 puffs into the lungs every 6 (six) hours as needed for wheezing or shortness of breath.   Unknown at Unknown time    Family History  Problem Relation Age of Onset  . Diabetes Father   . Heart disease Father   . Breast cancer Maternal Aunt        in 50's     Review of Systems:   Review of Systems  Constitutional: Negative.   Respiratory: Positive for shortness of breath.   Cardiovascular: Positive for chest pain.  Gastrointestinal: Positive for nausea.  Musculoskeletal: Positive for joint pain.  Neurological: Positive for weakness.      Physical Exam: BP (!) 152/62  Pulse 63   Temp 97.6 F (36.4 C) (Oral)   Resp 15   Ht 5' 1.5" (1.562 m)   Wt 76.1 kg   SpO2 98%   BMI 31.19 kg/m  Physical Exam Constitutional:      Appearance: Normal appearance.     Comments: Appears her stated age  HENT:     Head: Normocephalic and atraumatic.     Nose: Nose normal.  Eyes:     Extraocular Movements: Extraocular movements intact.      Conjunctiva/sclera: Conjunctivae normal.  Cardiovascular:     Rate and Rhythm: Normal rate and regular rhythm.  Pulmonary:     Effort: Pulmonary effort is normal. No respiratory distress.  Abdominal:     General: There is no distension.  Musculoskeletal:        General: Normal range of motion.     Cervical back: Normal range of motion.     Comments: Right knee incision from knee replacement  Skin:    General: Skin is warm and dry.     Comments: Chronic venous stasis changes to both lower extremities. Varicose veins and spider veins are present  Neurological:     General: No focal deficit present.     Mental Status: She is alert and oriented to person, place, and time.       Diagnostic Studies & Laboratory data:    Left Heart Catherization: Images reviewed.  She has good distal targets on all 3 walls.  There is a tight stenosis in her LAD and diagonal.  As well as tandem lesions in her right-sided system.  She also has some circumflex disease.  Echo: Preserved LV and RV function.  No significant valvular disease   I have independently reviewed the above radiologic studies and discussed with the patient   Recent Lab Findings: Lab Results  Component Value Date   WBC 4.1 08/13/2019   HGB 10.6 (L) 08/13/2019   HCT 33.3 (L) 08/13/2019   PLT 162 08/13/2019   GLUCOSE 142 (H) 08/10/2019   CHOL 154 02/28/2018   TRIG 156 (H) 02/28/2018   HDL 22 (L) 02/28/2018   LDLCALC 101 (H) 02/28/2018   ALT 11 08/25/2018   AST 15 08/25/2018   NA 141 08/10/2019   K 4.8 08/10/2019   CL 105 08/10/2019   CREATININE 1.67 (H) 08/10/2019   BUN 24 08/10/2019   CO2 26 08/10/2019   TSH 0.783 08/04/2015   INR 1.69 (H) 05/22/2015   HGBA1C 5.2 02/28/2018      Assessment / Plan:   This is a 79 year old female who presents for an elective left heart cath and was found to have severe three-vessel disease.  Her echocardiogram shows preserved LV and RV function and no significant valvular disease.   She has had a stroke in the past but has no significant residual deficits except for lower extremity weakness which may also be attributed to her right knee replacement and left knee pain.  She has been on Plavix up until 08/12/2019 for her stroke, but her P2 Y 12 was normal.  She is tentatively scheduled for August 17, 2019 for four-vessel CABG.  If she develops chest pain over the weekend given that she is a Plavix nonresponder she can be taken to surgery.     I  spent 40 minutes counseling the patient face to face.   [name] 08/13/2019 12:09 PM

## 2019-08-14 DIAGNOSIS — E785 Hyperlipidemia, unspecified: Secondary | ICD-10-CM

## 2019-08-14 DIAGNOSIS — I10 Essential (primary) hypertension: Secondary | ICD-10-CM

## 2019-08-14 LAB — BASIC METABOLIC PANEL
Anion gap: 9 (ref 5–15)
BUN: 14 mg/dL (ref 8–23)
CO2: 24 mmol/L (ref 22–32)
Calcium: 8.9 mg/dL (ref 8.9–10.3)
Chloride: 108 mmol/L (ref 98–111)
Creatinine, Ser: 1.44 mg/dL — ABNORMAL HIGH (ref 0.44–1.00)
GFR calc Af Amer: 40 mL/min — ABNORMAL LOW (ref >60)
GFR calc non Af Amer: 34 mL/min — ABNORMAL LOW (ref >60)
Glucose, Bld: 103 mg/dL — ABNORMAL HIGH (ref 70–99)
Potassium: 4.2 mmol/L (ref 3.5–5.1)
Sodium: 141 mmol/L (ref 135–145)

## 2019-08-14 LAB — CBC
HCT: 31.3 % — ABNORMAL LOW (ref 36.0–46.0)
Hemoglobin: 9.9 g/dL — ABNORMAL LOW (ref 12.0–15.0)
MCH: 30.7 pg (ref 26.0–34.0)
MCHC: 31.6 g/dL (ref 30.0–36.0)
MCV: 96.9 fL (ref 80.0–100.0)
Platelets: 157 10*3/uL (ref 150–400)
RBC: 3.23 MIL/uL — ABNORMAL LOW (ref 3.87–5.11)
RDW: 12.6 % (ref 11.5–15.5)
WBC: 4.9 10*3/uL (ref 4.0–10.5)
nRBC: 0 % (ref 0.0–0.2)

## 2019-08-14 LAB — LIPID PANEL
Cholesterol: 101 mg/dL (ref 0–200)
HDL: 43 mg/dL (ref >40)
LDL Cholesterol: 31 mg/dL (ref 0–99)
Total CHOL/HDL Ratio: 2.3 RATIO
Triglycerides: 135 mg/dL (ref <150)
VLDL: 27 mg/dL (ref 0–40)

## 2019-08-14 NOTE — Progress Notes (Signed)
Progress Note  Patient Name: Nicole Meyer Date of Encounter: 08/14/2019  [facility] Cardiologist: Mertie Moores, MD   Subjective   Sitting in bed, denies any chest pain or significant dyspnea while sitting here.  Frustrated because the television is not working, but otherwise no complaints.  Inpatient Medications    Scheduled Meds: . amLODipine  5 mg Oral Daily  . aspirin  81 mg Oral Daily  . isosorbide mononitrate  30 mg Oral Daily  . metoprolol succinate  25 mg Oral Daily  . pantoprazole  40 mg Oral Daily  . predniSONE  5 mg Oral QPC breakfast  . rosuvastatin  20 mg Oral Daily  . sodium chloride flush  3 mL Intravenous Q12H   Continuous Infusions: . sodium chloride     PRN Meds: sodium chloride, acetaminophen, albuterol, alum & mag hydroxide-simeth, Muscle Rub, nitroGLYCERIN, ondansetron (ZOFRAN) IV, sodium chloride flush   Vital Signs    Vitals:   08/13/19 0639 08/13/19 0845 08/13/19 1958 08/14/19 0621  BP: (!) 158/64 (!) 152/62 (!) 143/68 (!) 167/62  Pulse: 63  66 61  Resp: _0 Temp: 97.6 F (36.4 C)  98 F (36.7 C) 98.4 F (36.9 C)  TempSrc: Oral  Oral Oral  SpO2: 98%  99% 98%  Weight: 76.1 kg     Height:       No intake or output data in the 24 hours ending 08/14/19 0829 Last 3 Weights 08/13/2019 08/12/2019 08/10/2019  Weight (lbs) 167 lb 12.8 oz 171 lb 171 lb  Weight (kg) 76.114 kg 77.565 kg 77.565 kg      Telemetry    NSR-S Bracy rates 50s-60s - Personally Reviewed  ECG    n/a - Personally Reviewed  Physical Exam   General appearance: alert, cooperative, appears stated age, no distress and mildly obese Neck: no carotid bruit and no JVD Lungs: clear to auscultation bilaterally, normal percussion bilaterally and Nonlabored Heart: Distant heart sounds are normal S1 and S2.  No obvious M/R/G. Abdomen: soft, non-tender; bowel sounds normal; no masses,  no organomegaly Extremities: extremities normal, atraumatic, no cyanosis or  edema Pulses: 2+ and symmetric Neurologic: Grossly normal   Labs    High Sensitivity Troponin:  No results for input(s): TROPONINIHS in the last 720 hours.    Chemistry Recent Labs  Lab 08/10/19 1501 08/13/19 1115  NA 141 142  K 4.8 4.2  CL 105 108  CO2 26 25  GLUCOSE 142* 108*  BUN 24 15  CREATININE 1.67* 1.44*  CALCIUM 10.3 9.1  GFRNONAA 29* 34*  GFRAA 33* 40*  ANIONGAP  --  9     Hematology Recent Labs  Lab 08/10/19 1501 08/13/19 1115  WBC 7.6 4.1  RBC 3.57* 3.40*  HGB 10.9* 10.6*  HCT 33.5* 33.3*  MCV 94 97.9  MCH 30.5 31.2  MCHC 32.5 31.8  RDW 13.2 12.8  PLT 200 162    BNPNo results for input(s): BNP, PROBNP in the last 168 hours.   DDimer No results for input(s): DDIMER in the last 168 hours.   Radiology    CARDIAC CATHETERIZATION  Result Date: 08/12/2019  Prox Cx lesion is 70% stenosed.  Mid LAD lesion is 75% stenosed.  2nd Diag lesion is 95% stenosed.  Prox RCA lesion is 80% stenosed.  Mid RCA lesion is 80% stenosed.  LV end diastolic pressure is normal.  There is no aortic valve stenosis.  Three vessel CAD. Cardiac surgery consult.  Plan to  admit since she is having unstable/ accelerating symptoms.  Will need to consider adding IV heparin if radial site is ok, and as plavix wears off.  Will check echo and hydrate post procedure.   ECHOCARDIOGRAM COMPLETE  Result Date: 08/12/2019    ECHOCARDIOGRAM REPORT   Patient Name:   Nicole Meyer Date of Exam: 08/12/2019 Medical Rec #:  [id number]         Height:       61.5 in Accession #:    [id number]        Weight:       171.0 lb Date of Birth:  1940-04-24          BSA:          1.778 m Patient Age:    79 years          BP:           136/55 mmHg Patient Gender: F                 HR:           51 bpm. Exam Location:  Inpatient Procedure: 2D Echo Indications:    CAD 414. 01  History:        Patient has prior history of Echocardiogram examinations, most                 recent 02/28/2018. CAD, COPD; Risk  Factors:Hypertension,                 Dyslipidemia and Former Smoker.  Sonographer:    Jannett Celestine RDCS (AE) Referring Phys: [place]  1. Left ventricular ejection fraction, by estimation, is 60 to 65%. The left ventricle has normal function. The left ventricle has no regional wall motion abnormalities. There is mild left ventricular hypertrophy. Left ventricular diastolic parameters are consistent with Grade I diastolic dysfunction (impaired relaxation).  2. Right ventricular systolic function is normal. The right ventricular size is normal.  3. The mitral valve is abnormal. Moderate mitral annular calcification. No evidence of mitral valve regurgitation.  4. The aortic valve was not well visualized. Aortic valve regurgitation is not visualized. No aortic stenosis is present.  5. The inferior vena cava is normal in size with greater than 50% respiratory variability, suggesting right atrial pressure of 3 mmHg. FINDINGS  Left Ventricle: Left ventricular ejection fraction, by estimation, is 60 to 65%. The left ventricle has normal function. The left ventricle has no regional wall motion abnormalities. The left ventricular internal cavity size was normal in size. There is  mild left ventricular hypertrophy. Left ventricular diastolic parameters are consistent with Grade I diastolic dysfunction (impaired relaxation). Right Ventricle: The right ventricular size is normal. Right vetricular wall thickness was not assessed. Right ventricular systolic function is normal. Left Atrium: Left atrial size was normal in size. Right Atrium: Right atrial size was normal in size. Pericardium: There is no evidence of pericardial effusion. Mitral Valve: The mitral valve is abnormal. Moderate mitral annular calcification. No evidence of mitral valve regurgitation. Tricuspid Valve: The tricuspid valve is normal in structure. Tricuspid valve regurgitation is trivial. Aortic Valve: The aortic valve was not  well visualized. Aortic valve regurgitation is not visualized. No aortic stenosis is present. Pulmonic Valve: The pulmonic valve was not well visualized. Pulmonic valve regurgitation is not visualized. Aorta: The aortic root is normal in size and structure. Venous: The inferior vena cava is normal in size with greater than 50% respiratory variability, suggesting  right atrial pressure of 3 mmHg. IAS/Shunts: The interatrial septum was not well visualized.  LEFT VENTRICLE PLAX 2D LVIDd:         4.50 cm  Diastology LVIDs:         3.00 cm  LV e' lateral:   8.38 cm/s LV PW:         1.00 cm  LV E/e' lateral: 9.2 LV IVS:        1.00 cm  LV e' medial:    6.20 cm/s LVOT diam:     2.00 cm  LV E/e' medial:  12.4 LV SV:         57 LV SV Index:   32 LVOT Area:     3.14 cm  RIGHT VENTRICLE TAPSE (M-mode): 2.0 cm LEFT ATRIUM             Index       RIGHT ATRIUM           Index LA diam:        4.20 cm 2.36 cm/m  RA Area:     12.50 cm LA Vol (A2C):   64.2 ml 36.11 ml/m RA Volume:   23.20 ml  13.05 ml/m LA Vol (A4C):   33.2 ml 18.67 ml/m LA Biplane Vol: 51.1 ml 28.74 ml/m  AORTIC VALVE LVOT Vmax:   89.70 cm/s LVOT Vmean:  62.300 cm/s LVOT VTI:    0.183 m  AORTA Ao Root diam: 3.30 cm MITRAL VALVE MV Area (PHT): 2.07 cm     SHUNTS MV Decel Time: 366 msec     Systemic VTI:  0.18 m MV E velocity: 77.10 cm/s   Systemic Diam: 2.00 cm MV A velocity: 112.00 cm/s MV E/A ratio:  0.69 Oswaldo Milian MD Electronically signed by Oswaldo Milian MD Signature Date/Time: 08/12/2019/8:03:37 PM    Final     Cardiac Studies   Echo 08/12/19: EF 60 to 65%.  No R WMA.  GR 1 DD/impaired laxation.  Normal RV size and function.  Moderate MAC.  No MR.  No aortic stenosis.  Normal RV/RA pressures.  Left heart cath 08/12/19: pCx 70%, mLAD 75% & D2 95%, pRCA 80%- mRCA 80%. Normal LVEDP. No AS. = 3 V CAD - CVTS Consulted.  -->  Plan to admit since she is having unstable/ accelerating symptoms.  Will need to consider adding IV heparin if  radial site is ok, and as plavix wears off.    Patient Profile     79 y.o. female with a history of CAD, HTN, HLD, and past CVA on plavix who was found to have 3-v disease.   Assessment & Plan    CAD - 3 vessel disease; presenting symptoms were progressively worsening exertional dyspnea over the last 6 months.  Cath films personally reviewed.  Agree that best revascularization option is CABG.   Was seen by Dr. Kipp Brood from [place] with plans for CABG on the 29th to allow for Plavix washout.  Likely does not require IV heparin unless she has recurrent chest pain based on lack of critical greater than 90% lesions.  Continue amlodipine and Toprol along with Imdur and rosuvastatin. - continue ASA, holding plavix - question need for heparin gtt - per Dr. Debara Pickett   Hypertension -blood pressures are little elevated  We will increase amlodipine to 10 mg; with borderline bradycardia, would not titrate beta-blocker further   Anemia - Hb 10.6 --> relatively stable from precath labs.  In reviewing her previous labs, seems to be  relatively chronic.  No active bleeding.  Will check anemia panel, may benefit from iron supplementation.   Hyperlipidemia - will update lipid profile - pt on 20 mg crestor   Hx of CVA -> no active symptoms.    Plavix on hold for CABG.  Continue aspirin.   A on CKD3b - sCr improved to 1.44.  Suspect that this could have been related to being n.p.o. for cath  Baseline creatinine seems to be about 1.33.  Will reorder labs for tomorrow.    For questions or updates, please contact [place] Please consult [email or web address] for contact info under        Signed, Glenetta Hew, MD  08/14/2019, 8:29 AM

## 2019-08-15 DIAGNOSIS — N183 Chronic kidney disease, stage 3 unspecified: Secondary | ICD-10-CM

## 2019-08-15 DIAGNOSIS — I119 Hypertensive heart disease without heart failure: Secondary | ICD-10-CM

## 2019-08-15 DIAGNOSIS — D649 Anemia, unspecified: Secondary | ICD-10-CM

## 2019-08-15 LAB — BASIC METABOLIC PANEL
Anion gap: 8 (ref 5–15)
BUN: 18 mg/dL (ref 8–23)
CO2: 24 mmol/L (ref 22–32)
Calcium: 8.8 mg/dL — ABNORMAL LOW (ref 8.9–10.3)
Chloride: 107 mmol/L (ref 98–111)
Creatinine, Ser: 1.61 mg/dL — ABNORMAL HIGH (ref 0.44–1.00)
GFR calc Af Amer: 35 mL/min — ABNORMAL LOW (ref >60)
GFR calc non Af Amer: 30 mL/min — ABNORMAL LOW (ref >60)
Glucose, Bld: 104 mg/dL — ABNORMAL HIGH (ref 70–99)
Potassium: 4.4 mmol/L (ref 3.5–5.1)
Sodium: 139 mmol/L (ref 135–145)

## 2019-08-15 LAB — CBC
HCT: 31.1 % — ABNORMAL LOW (ref 36.0–46.0)
Hemoglobin: 9.9 g/dL — ABNORMAL LOW (ref 12.0–15.0)
MCH: 30.4 pg (ref 26.0–34.0)
MCHC: 31.8 g/dL (ref 30.0–36.0)
MCV: 95.4 fL (ref 80.0–100.0)
Platelets: 156 10*3/uL (ref 150–400)
RBC: 3.26 MIL/uL — ABNORMAL LOW (ref 3.87–5.11)
RDW: 12.5 % (ref 11.5–15.5)
WBC: 5.5 10*3/uL (ref 4.0–10.5)
nRBC: 0 % (ref 0.0–0.2)

## 2019-08-15 LAB — OCCULT BLOOD X 1 CARD TO LAB, STOOL: Fecal Occult Bld: NEGATIVE

## 2019-08-15 MED ORDER — METOPROLOL TARTRATE 12.5 MG HALF TABLET
12.5000 mg | ORAL_TABLET | Freq: Once | ORAL | Status: AC
  Administered 2019-08-17: 12.5 mg via ORAL
  Filled 2019-08-15: qty 1

## 2019-08-15 MED ORDER — CHLORHEXIDINE GLUCONATE 0.12 % MT SOLN: 15.0000 mL | Freq: Once | OROMUCOSAL | Status: DC

## 2019-08-15 MED ORDER — TEMAZEPAM 15 MG PO CAPS: 15.0000 mg | ORAL_CAPSULE | Freq: Once | ORAL | Status: DC | PRN

## 2019-08-15 MED ORDER — BISACODYL 5 MG PO TBEC
5.0000 mg | DELAYED_RELEASE_TABLET | Freq: Once | ORAL | Status: AC
  Administered 2019-08-16: 5 mg via ORAL
  Filled 2019-08-15: qty 1

## 2019-08-15 NOTE — Progress Notes (Addendum)
Progress Note  Patient Name: Nicole Meyer Date of Encounter: 08/15/2019  [facility] Cardiologist: Mertie Moores, MD   Subjective   No chest discomfort  Inpatient Medications    Scheduled Meds: . amLODipine  5 mg Oral Daily  . aspirin  81 mg Oral Daily  . isosorbide mononitrate  30 mg Oral Daily  . metoprolol succinate  25 mg Oral Daily  . pantoprazole  40 mg Oral Daily  . predniSONE  5 mg Oral QPC breakfast  . rosuvastatin  20 mg Oral Daily  . sodium chloride flush  3 mL Intravenous Q12H   Continuous Infusions: . sodium chloride     PRN Meds: sodium chloride, acetaminophen, albuterol, alum & mag hydroxide-simeth, Muscle Rub, nitroGLYCERIN, ondansetron (ZOFRAN) IV, sodium chloride flush   Vital Signs    Vitals:   08/15/19 0611 08/15/19 0836 08/15/19 0917 08/15/19 0927  BP: (!) 173/72 (!) 142/68 (!) 130/51 (!) 130/51  Pulse: (!) 57 (!) 56 63 66  Resp: 16 15  16   Temp: 98.3 F (36.8 C) 98.1 F (36.7 C)    TempSrc: Oral Oral    SpO2: 98% 99%  98%  Weight: 74.6 kg     Height:        Intake/Output Summary (Last 24 hours) at 08/15/2019 0948 Last data filed at 08/14/2019 2123 Gross per 24 hour  Intake 363 ml  Output --  Net 363 ml   Last 3 Weights 08/15/2019 08/13/2019 08/12/2019  Weight (lbs) 164 lb 8 oz 167 lb 12.8 oz 171 lb  Weight (kg) 74.617 kg 76.114 kg 77.565 kg      Telemetry    Normal sinus rhythm- Personally Reviewed  ECG    None recent  Physical Exam   GEN: No acute distress.   Neck: No JVD Cardiac: RRR, no murmurs, rubs, or gallops.  Respiratory: Clear to auscultation bilaterally. GI: Soft, nontender, non-distended  MS: No edema; No deformity.  Mild bruising at right wrist Neuro:  Nonfocal  Psych: Normal affect   Labs    High Sensitivity Troponin:  No results for input(s): TROPONINIHS in the last 720 hours.    Chemistry Recent Labs  Lab 08/13/19 1115 08/14/19 0837 08/15/19 0242  NA 142 141 139  K 4.2 4.2 4.4  CL 108  108 107  CO2 25 24 24   GLUCOSE 108* 103* 104*  BUN 15 14 18   CREATININE 1.44* 1.44* 1.61*  CALCIUM 9.1 8.9 8.8*  GFRNONAA 34* 34* 30*  GFRAA 40* 40* 35*  ANIONGAP 9 9 8      Hematology Recent Labs  Lab 08/13/19 1115 08/14/19 0837 08/15/19 0242  WBC 4.1 4.9 5.5  RBC 3.40* 3.23* 3.26*  HGB 10.6* 9.9* 9.9*  HCT 33.3* 31.3* 31.1*  MCV 97.9 96.9 95.4  MCH 31.2 30.7 30.4  MCHC 31.8 31.6 31.8  RDW 12.8 12.6 12.5  PLT 162 157 156    BNPNo results for input(s): BNP, PROBNP in the last 168 hours.   DDimer No results for input(s): DDIMER in the last 168 hours.   Radiology    No results found.  Cardiac Studies   Cath showing severe three-vessel disease  Patient Profile     79 y.o. female with severe three-vessel disease  Assessment & Plan    CAD: Plan for CABG on Tuesday.  No chest discomfort at this time.  At home, she reported having rest pain, but this has not recurred since being in the hospital.  In addition, RCA stenoses on  angiogram were ulcerative and irregular and it was difficult to exclude the possibility of thrombus in that vessel-although calcium in that vessel was a more likely cause of the irregular appearance.  We have not started IV heparin since she was on Plavix as well and has been stable.  She is also had some mild anemia.  We will hold off for now on IV heparin.  If she does develop any discomfort, would start an IV heparin drip.    P2 Y 12 testing shows she was a Plavix nonresponder.  Per Dr. Kipp Brood, if emergency surgery was needed, this could be done without increased bleeding risk.  Hypertension: Controlled today.  CKD: Creatinine increased today at 1.6.  Hemoglobin also decreased down to 9.9.  Will order FOBT.  Encourage oral intake.  No ACE-I.  We will order incentive spirometer given upcoming surgery.   For questions or updates, please contact [place] Please consult [email or web address] for contact info under        Signed, Nicole Grooms, MD  08/15/2019, 9:48 AM

## 2019-08-16 ENCOUNTER — Inpatient Hospital Stay (HOSPITAL_COMMUNITY): Payer: Medicare PPO

## 2019-08-16 DIAGNOSIS — Z01810 Encounter for preprocedural cardiovascular examination: Secondary | ICD-10-CM

## 2019-08-16 LAB — PREPARE RBC (CROSSMATCH)

## 2019-08-16 LAB — BLOOD GAS, ARTERIAL
Acid-base deficit: 0.8 mmol/L (ref 0.0–2.0)
Bicarbonate: 23.1 mmol/L (ref 20.0–28.0)
Drawn by: 59133
FIO2: 21
O2 Saturation: 95.4 %
Patient temperature: 37
pCO2 arterial: 36.7 mmHg (ref 32.0–48.0)
pH, Arterial: 7.416 (ref 7.350–7.450)
pO2, Arterial: 84.3 mmHg (ref 83.0–108.0)

## 2019-08-16 LAB — CBC
HCT: 31.9 % — ABNORMAL LOW (ref 36.0–46.0)
Hemoglobin: 10.3 g/dL — ABNORMAL LOW (ref 12.0–15.0)
MCH: 30.8 pg (ref 26.0–34.0)
MCHC: 32.3 g/dL (ref 30.0–36.0)
MCV: 95.5 fL (ref 80.0–100.0)
Platelets: 174 10*3/uL (ref 150–400)
RBC: 3.34 MIL/uL — ABNORMAL LOW (ref 3.87–5.11)
RDW: 12.6 % (ref 11.5–15.5)
WBC: 5.9 10*3/uL (ref 4.0–10.5)
nRBC: 0 % (ref 0.0–0.2)

## 2019-08-16 LAB — COMPREHENSIVE METABOLIC PANEL
ALT: 14 U/L (ref 0–44)
AST: 16 U/L (ref 15–41)
Albumin: 3.4 g/dL — ABNORMAL LOW (ref 3.5–5.0)
Alkaline Phosphatase: 54 U/L (ref 38–126)
Anion gap: 9 (ref 5–15)
BUN: 19 mg/dL (ref 8–23)
CO2: 24 mmol/L (ref 22–32)
Calcium: 9.3 mg/dL (ref 8.9–10.3)
Chloride: 106 mmol/L (ref 98–111)
Creatinine, Ser: 1.4 mg/dL — ABNORMAL HIGH (ref 0.44–1.00)
GFR calc Af Amer: 41 mL/min — ABNORMAL LOW (ref >60)
GFR calc non Af Amer: 36 mL/min — ABNORMAL LOW (ref >60)
Glucose, Bld: 114 mg/dL — ABNORMAL HIGH (ref 70–99)
Potassium: 3.9 mmol/L (ref 3.5–5.1)
Sodium: 139 mmol/L (ref 135–145)
Total Bilirubin: 1.2 mg/dL (ref 0.3–1.2)
Total Protein: 6.6 g/dL (ref 6.5–8.1)

## 2019-08-16 LAB — URINALYSIS, ROUTINE W REFLEX MICROSCOPIC
Bilirubin Urine: NEGATIVE
Glucose, UA: NEGATIVE mg/dL
Ketones, ur: NEGATIVE mg/dL
Nitrite: NEGATIVE
Protein, ur: NEGATIVE mg/dL
Specific Gravity, Urine: 1.012 (ref 1.005–1.030)
WBC, UA: >50 WBC/hpf — ABNORMAL HIGH (ref 0–5)
pH: 7 (ref 5.0–8.0)

## 2019-08-16 LAB — BASIC METABOLIC PANEL
Anion gap: 8 (ref 5–15)
BUN: 19 mg/dL (ref 8–23)
CO2: 23 mmol/L (ref 22–32)
Calcium: 9 mg/dL (ref 8.9–10.3)
Chloride: 108 mmol/L (ref 98–111)
Creatinine, Ser: 1.45 mg/dL — ABNORMAL HIGH (ref 0.44–1.00)
GFR calc Af Amer: 40 mL/min — ABNORMAL LOW (ref >60)
GFR calc non Af Amer: 34 mL/min — ABNORMAL LOW (ref >60)
Glucose, Bld: 121 mg/dL — ABNORMAL HIGH (ref 70–99)
Potassium: 4.2 mmol/L (ref 3.5–5.1)
Sodium: 139 mmol/L (ref 135–145)

## 2019-08-16 LAB — PROTIME-INR
INR: 1 (ref 0.8–1.2)
Prothrombin Time: 13.2 seconds (ref 11.4–15.2)

## 2019-08-16 LAB — SURGICAL PCR SCREEN
MRSA, PCR: NEGATIVE
Staphylococcus aureus: NEGATIVE

## 2019-08-16 LAB — TYPE AND SCREEN
ABO/RH(D): O POS
Antibody Screen: NEGATIVE

## 2019-08-16 LAB — APTT: aPTT: 37 seconds — ABNORMAL HIGH (ref 24–36)

## 2019-08-16 MED ORDER — MANNITOL 20 % IV SOLN
INTRAVENOUS | Status: DC
  Filled 2019-08-16: qty 13

## 2019-08-16 MED ORDER — TEMAZEPAM 15 MG PO CAPS: 15.0000 mg | ORAL_CAPSULE | Freq: Once | ORAL | Status: DC | PRN

## 2019-08-16 MED ORDER — CHLORHEXIDINE GLUCONATE 0.12 % MT SOLN
15.0000 mL | Freq: Once | OROMUCOSAL | Status: AC
  Administered 2019-08-17: 15 mL via OROMUCOSAL
  Filled 2019-08-16: qty 15

## 2019-08-16 MED ORDER — METOPROLOL TARTRATE 12.5 MG HALF TABLET: 12.5000 mg | ORAL_TABLET | Freq: Once | ORAL | Status: DC

## 2019-08-16 MED ORDER — TRANEXAMIC ACID 1000 MG/10ML IV SOLN
1.5000 mg/kg/h | INTRAVENOUS | Status: AC
  Administered 2019-08-17: 1.5 mg/kg/h via INTRAVENOUS
  Filled 2019-08-16: qty 25

## 2019-08-16 MED ORDER — TRANEXAMIC ACID (OHS) PUMP PRIME SOLUTION
2.0000 mg/kg | INTRAVENOUS | Status: DC
  Filled 2019-08-16: qty 1.49

## 2019-08-16 MED ORDER — VANCOMYCIN HCL 1250 MG/250ML IV SOLN
1250.0000 mg | INTRAVENOUS | Status: AC
  Administered 2019-08-17: 1250 mg via INTRAVENOUS
  Filled 2019-08-16: qty 250

## 2019-08-16 MED ORDER — CHLORHEXIDINE GLUCONATE CLOTH 2 % EX PADS: 6.0000 | MEDICATED_PAD | Freq: Once | CUTANEOUS | Status: AC

## 2019-08-16 MED ORDER — POTASSIUM CHLORIDE 2 MEQ/ML IV SOLN
80.0000 meq | INTRAVENOUS | Status: DC
  Filled 2019-08-16: qty 40

## 2019-08-16 MED ORDER — DEXMEDETOMIDINE HCL IN NACL 400 MCG/100ML IV SOLN
0.1000 ug/kg/h | INTRAVENOUS | Status: AC
  Administered 2019-08-17: .2 ug/kg/h via INTRAVENOUS
  Filled 2019-08-16: qty 100

## 2019-08-16 MED ORDER — EPINEPHRINE HCL 5 MG/250ML IV SOLN IN NS
0.0000 ug/min | INTRAVENOUS | Status: DC
  Filled 2019-08-16: qty 250

## 2019-08-16 MED ORDER — TRANEXAMIC ACID (OHS) BOLUS VIA INFUSION
15.0000 mg/kg | INTRAVENOUS | Status: AC
  Administered 2019-08-17: 1119 mg via INTRAVENOUS
  Filled 2019-08-16: qty 1119

## 2019-08-16 MED ORDER — NOREPINEPHRINE 4 MG/250ML-% IV SOLN
0.0000 ug/min | INTRAVENOUS | Status: DC
  Filled 2019-08-16 (×2): qty 250

## 2019-08-16 MED ORDER — CHLORHEXIDINE GLUCONATE CLOTH 2 % EX PADS
6.0000 | MEDICATED_PAD | Freq: Once | CUTANEOUS | Status: AC
  Administered 2019-08-16: 6 via TOPICAL

## 2019-08-16 MED ORDER — PHENYLEPHRINE HCL-NACL 20-0.9 MG/250ML-% IV SOLN
30.0000 ug/min | INTRAVENOUS | Status: AC
  Administered 2019-08-17: 20 ug/min via INTRAVENOUS
  Filled 2019-08-16: qty 250

## 2019-08-16 MED ORDER — NITROGLYCERIN IN D5W 200-5 MCG/ML-% IV SOLN
2.0000 ug/min | INTRAVENOUS | Status: AC
  Administered 2019-08-17: 16.6 ug/min via INTRAVENOUS
  Filled 2019-08-16: qty 250

## 2019-08-16 MED ORDER — MILRINONE LACTATE IN DEXTROSE 20-5 MG/100ML-% IV SOLN
0.3000 ug/kg/min | INTRAVENOUS | Status: DC
  Filled 2019-08-16: qty 100

## 2019-08-16 MED ORDER — BISACODYL 5 MG PO TBEC: 5.0000 mg | DELAYED_RELEASE_TABLET | Freq: Once | ORAL | Status: DC

## 2019-08-16 MED ORDER — SODIUM CHLORIDE 0.9 % IV SOLN
INTRAVENOUS | Status: DC
  Filled 2019-08-16: qty 30

## 2019-08-16 MED ORDER — LEVOFLOXACIN IN D5W 500 MG/100ML IV SOLN
500.0000 mg | INTRAVENOUS | Status: AC
  Administered 2019-08-17: 500 mg via INTRAVENOUS
  Filled 2019-08-16: qty 100

## 2019-08-16 MED ORDER — PLASMA-LYTE 148 IV SOLN
INTRAVENOUS | Status: DC
  Filled 2019-08-16: qty 2.5

## 2019-08-16 MED ORDER — INSULIN REGULAR(HUMAN) IN NACL 100-0.9 UT/100ML-% IV SOLN
INTRAVENOUS | Status: AC
  Administered 2019-08-17: 1 [IU]/h via INTRAVENOUS
  Filled 2019-08-16: qty 100

## 2019-08-16 NOTE — Progress Notes (Signed)
VASCULAR LAB PRELIMINARY  PRELIMINARY  PRELIMINARY  PRELIMINARY  Bilateral saphenous vein mapping completed.    Preliminary report:  See CV proc for preliminary results.  [place]iana Pechacek, RVT 08/16/2019, 12:41 PM

## 2019-08-16 NOTE — Progress Notes (Signed)
VASCULAR LAB    Pre CABG Dopplers completed.    Preliminary report:  See CV proc for preliminary results.  Kaidan Spengler, RVT 08/16/2019, 12:39 PM

## 2019-08-16 NOTE — Progress Notes (Addendum)
Progress Note  Patient Name: Nicole Meyer Date of Encounter: 08/16/2019  [facility] Cardiologist: Mertie Moores, MD   Subjective   Denies chest pain. She wants a collapsible walker at discharge.   Inpatient Medications    Scheduled Meds: . amLODipine  5 mg Oral Daily  . aspirin  81 mg Oral Daily  . bisacodyl  5 mg Oral Once  . [START ON 08/17/2019] chlorhexidine  15 mL Mouth/Throat Once  . isosorbide mononitrate  30 mg Oral Daily  . metoprolol succinate  25 mg Oral Daily  . [START ON 08/17/2019] metoprolol tartrate  12.5 mg Oral Once  . pantoprazole  40 mg Oral Daily  . predniSONE  5 mg Oral QPC breakfast  . rosuvastatin  20 mg Oral Daily  . sodium chloride flush  3 mL Intravenous Q12H   Continuous Infusions: . sodium chloride     PRN Meds: sodium chloride, acetaminophen, albuterol, alum & mag hydroxide-simeth, Muscle Rub, nitroGLYCERIN, ondansetron (ZOFRAN) IV, sodium chloride flush, temazepam   Vital Signs    Vitals:   08/15/19 0927 08/15/19 1200 08/15/19 1737 08/15/19 1950  BP: (!) 130/51 122/62 (!) 140/97 (!) 154/74  Pulse: 66 68 (!) 55 60  Resp: 16 17 18 15   Temp:  98 F (36.7 C) 98.2 F (36.8 C) 98.4 F (36.9 C)  TempSrc:  Oral Oral Oral  SpO2: 98%   100%  Weight:      Height:        Intake/Output Summary (Last 24 hours) at 08/16/2019 0751 Last data filed at 08/15/2019 1300 Gross per 24 hour  Intake 240 ml  Output --  Net 240 ml   Last 3 Weights 08/15/2019 08/13/2019 08/12/2019  Weight (lbs) 164 lb 8 oz 167 lb 12.8 oz 171 lb  Weight (kg) 74.617 kg 76.114 kg 77.565 kg      Telemetry    Sinus to sinus bradycardia 40-60s - Personally Reviewed  ECG    No new tracings - Personally Reviewed  Physical Exam   GEN: No acute distress.   Neck: No JVD Cardiac: RRR, no murmurs, rubs, or gallops.  Respiratory: Clear to auscultation bilaterally. GI: Soft, nontender, non-distended  MS: trace edema; No deformity. Neuro:  Nonfocal  Psych: Normal  affect   Labs    High Sensitivity Troponin:  No results for input(s): TROPONINIHS in the last 720 hours.    Chemistry Recent Labs  Lab 08/14/19 0837 08/15/19 0242 08/16/19 0004  NA 141 139 139  K 4.2 4.4 4.2  CL 108 107 108  CO2 24 24 23   GLUCOSE 103* 104* 121*  BUN 14 18 19   CREATININE 1.44* 1.61* 1.45*  CALCIUM 8.9 8.8* 9.0  GFRNONAA 34* 30* 34*  GFRAA 40* 35* 40*  ANIONGAP 9 8 8      Hematology Recent Labs  Lab 08/14/19 0837 08/15/19 0242 08/16/19 0004  WBC 4.9 5.5 5.9  RBC 3.23* 3.26* 3.34*  HGB 9.9* 9.9* 10.3*  HCT 31.3* 31.1* 31.9*  MCV 96.9 95.4 95.5  MCH 30.7 30.4 30.8  MCHC 31.6 31.8 32.3  RDW 12.6 12.5 12.6  PLT 157 156 174    BNPNo results for input(s): BNP, PROBNP in the last 168 hours.   DDimer No results for input(s): DDIMER in the last 168 hours.   Radiology    No results found.  Cardiac Studies   Echo 08/12/19: 1. Left ventricular ejection fraction, by estimation, is 60 to 65%. The  left ventricle has normal function. The left  ventricle has no regional  wall motion abnormalities. There is mild left ventricular hypertrophy.  Left ventricular diastolic parameters  are consistent with Grade I diastolic dysfunction (impaired relaxation).  2. Right ventricular systolic function is normal. The right ventricular  size is normal.  3. The mitral valve is abnormal. Moderate mitral annular calcification.  No evidence of mitral valve regurgitation.  4. The aortic valve was not well visualized. Aortic valve regurgitation  is not visualized. No aortic stenosis is present.  5. The inferior vena cava is normal in size with greater than 50%  respiratory variability, suggesting right atrial pressure of 3 mmHg.  Left heart cath 08/12/19:  Prox Cx lesion is 70% stenosed.  Mid LAD lesion is 75% stenosed.  2nd Diag lesion is 95% stenosed.  Prox RCA lesion is 80% stenosed.  Mid RCA lesion is 80% stenosed.  LV end diastolic pressure is  normal.  There is no aortic valve stenosis.  Three vessel CAD. Cardiac surgery consult. Plan to admit since she is having unstable/ accelerating symptoms. Will need to consider adding IV heparin if radial site is ok, and as plavix wears off.   Will check echo and hydrate post procedure.  Patient Profile     79 y.o. female with a history of CAD, HTN, HLD, and past CVA on plavix who was found to have 3-v disease.   Assessment & Plan    Multivessel CAD - plan for CABG tomorrow - heparin held, no recurrence of chest pain - plavix on hold; however, P2Y12 testing showed she was a plavix nonresponder - continue ASA, imdur, BB, and amlodipine   Hypertension - medications as above - pressure labile, but controlled yesterday   CKD stage III - sCr 1.45 with K 4.2 - baseline ?1.3   Anemia - Hb stable at 10.3 - this appears chronic - no signs of active bleeding   Hyperlipidemia 08/14/2019: Cholesterol 101; HDL 43; LDL Cholesterol 31; Triglycerides 135; VLDL 27 Continue crestor 20 mg   History of CVA - continue ASA - plavix on hold - was a nonresponder     For questions or updates, please contact [facility] Please consult [email or web address] for contact info under        Signed, Ledora Bottcher, PA  08/16/2019, 7:51 AM  \  I have examined the patient and reviewed assessment and plan and discussed with patient.  Agree with above as stated.  Plan for CABG tomorrow.  Hemoccult negative.  Chronic anemia.  Dulcolax preoperatively given today.  No angina.  Hypertensive heart disease well controlled.  Feeling well.  Will follow postoperatively.   Larae Grooms

## 2019-08-16 NOTE — Progress Notes (Signed)
[id number] Pt just back from test. Offered to walk with pt . She stated she has been walking to bathroom without CP. Stated so tired now and would prefer not to walk. Discussed with pt staying in the tube and sternal precautions. Reviewed importance of IS and mobility after surgery. Pt able to demonstrate 1250 ml correctly with instruction. Gave OHS booklet and staying in the tube handout. Pt stated she was walking with walker prior to admission. Wrote down how to view pre op video. Pt stated husband will be available to help with her care at discharge. Will follow up after surgery. Graylon Good RN BSN 08/16/2019 9:48 AM

## 2019-08-16 NOTE — Anesthesia Preprocedure Evaluation (Addendum)
Anesthesia Evaluation  Patient identified by MRN, date of birth, ID band Patient awake    Reviewed: Allergy & Precautions, NPO status , Patient's Chart, lab work & pertinent test results, reviewed documented beta blocker date and time   History of Anesthesia Complications Negative for: history of anesthetic complications  Airway Mallampati: I  TM Distance: >3 FB Neck ROM: Full    Dental  (+) Edentulous Upper, Edentulous Lower   Pulmonary COPD,  COPD inhaler, former smoker,    breath sounds clear to auscultation       Cardiovascular hypertension, Pt. on medications and Pt. on home beta blockers + angina + CAD   Rhythm:Regular Rate:Normal  08/12/2019 ECHO: EF 60-65%, mild LVH, Grade 1 diastolic dysfunction, mod mitral annular calcification   Neuro/Psych Anxiety Depression CVA (walks with walker, legs weak)    GI/Hepatic Neg liver ROS, GERD  Controlled,  Endo/Other  polymyalgia rheumatica  Renal/GU Renal InsufficiencyRenal disease (creat 1.4)     Musculoskeletal   Abdominal   Peds  Hematology  (+) Blood dyscrasia (Hb 10.3), anemia , plavix   Anesthesia Other Findings   Reproductive/Obstetrics                            Anesthesia Physical Anesthesia Plan  ASA: III  Anesthesia Plan: General   Post-op Pain Management:    Induction: Intravenous  PONV Risk Score and Plan: 3 and Treatment may vary due to age or medical condition  Airway Management Planned: Oral ETT  Additional Equipment: Ultrasound Guidance Line Placement, CVP, Arterial line and TEE  Intra-op Plan:   Post-operative Plan: Post-operative intubation/ventilation  Informed Consent: I have reviewed the patients History and Physical, chart, labs and discussed the procedure including the risks, benefits and alternatives for the proposed anesthesia with the patient or authorized representative who has indicated his/her  understanding and acceptance.       Plan Discussed with: CRNA and Surgeon  Anesthesia Plan Comments:        Anesthesia Quick Evaluation

## 2019-08-17 ENCOUNTER — Inpatient Hospital Stay (HOSPITAL_COMMUNITY): Payer: Medicare PPO

## 2019-08-17 ENCOUNTER — Inpatient Hospital Stay (HOSPITAL_COMMUNITY): Payer: Medicare PPO | Admitting: Anesthesiology

## 2019-08-17 ENCOUNTER — Encounter (HOSPITAL_COMMUNITY)
Admission: AD | Disposition: A | Discharge status: Skilled Nursing Facility | Payer: Self-pay | Source: Home / Self Care | Attending: Thoracic Surgery (Cardiothoracic Vascular Surgery)

## 2019-08-17 ENCOUNTER — Encounter (HOSPITAL_COMMUNITY): Payer: Self-pay | Admitting: Interventional Cardiology

## 2019-08-17 DIAGNOSIS — I25110 Atherosclerotic heart disease of native coronary artery with unstable angina pectoris: Secondary | ICD-10-CM

## 2019-08-17 DIAGNOSIS — I2510 Atherosclerotic heart disease of native coronary artery without angina pectoris: Secondary | ICD-10-CM | POA: Diagnosis present

## 2019-08-17 HISTORY — PX: CORONARY ARTERY BYPASS GRAFT: SHX141

## 2019-08-17 HISTORY — PX: TEE WITHOUT CARDIOVERSION: SHX5443

## 2019-08-17 HISTORY — PX: ENDOVEIN HARVEST OF GREATER SAPHENOUS VEIN: SHX5059

## 2019-08-17 LAB — POCT I-STAT, CHEM 8
BUN: 21 mg/dL (ref 8–23)
BUN: 21 mg/dL (ref 8–23)
BUN: 22 mg/dL (ref 8–23)
BUN: 19 mg/dL (ref 8–23)
BUN: 21 mg/dL (ref 8–23)
Calcium, Ion: 1.3 mmol/L (ref 1.15–1.40)
Calcium, Ion: 1.25 mmol/L (ref 1.15–1.40)
Calcium, Ion: 0.98 mmol/L — ABNORMAL LOW (ref 1.15–1.40)
Calcium, Ion: 0.91 mmol/L — ABNORMAL LOW (ref 1.15–1.40)
Calcium, Ion: 1.25 mmol/L (ref 1.15–1.40)
Chloride: 104 mmol/L (ref 98–111)
Chloride: 104 mmol/L (ref 98–111)
Chloride: 100 mmol/L (ref 98–111)
Chloride: 101 mmol/L (ref 98–111)
Chloride: 102 mmol/L (ref 98–111)
Creatinine, Ser: 1.1 mg/dL — ABNORMAL HIGH (ref 0.44–1.00)
Creatinine, Ser: 1.2 mg/dL — ABNORMAL HIGH (ref 0.44–1.00)
Creatinine, Ser: 1 mg/dL (ref 0.44–1.00)
Creatinine, Ser: 0.8 mg/dL (ref 0.44–1.00)
Creatinine, Ser: 0.9 mg/dL (ref 0.44–1.00)
Glucose, Bld: 114 mg/dL — ABNORMAL HIGH (ref 70–99)
Glucose, Bld: 120 mg/dL — ABNORMAL HIGH (ref 70–99)
Glucose, Bld: 134 mg/dL — ABNORMAL HIGH (ref 70–99)
Glucose, Bld: 132 mg/dL — ABNORMAL HIGH (ref 70–99)
Glucose, Bld: 156 mg/dL — ABNORMAL HIGH (ref 70–99)
HCT: 28 % — ABNORMAL LOW (ref 36.0–46.0)
HCT: 25 % — ABNORMAL LOW (ref 36.0–46.0)
HCT: 22 % — ABNORMAL LOW (ref 36.0–46.0)
HCT: 19 % — ABNORMAL LOW (ref 36.0–46.0)
HCT: 22 % — ABNORMAL LOW (ref 36.0–46.0)
Hemoglobin: 9.5 g/dL — ABNORMAL LOW (ref 12.0–15.0)
Hemoglobin: 8.5 g/dL — ABNORMAL LOW (ref 12.0–15.0)
Hemoglobin: 7.5 g/dL — ABNORMAL LOW (ref 12.0–15.0)
Hemoglobin: 6.5 g/dL — CL (ref 12.0–15.0)
Hemoglobin: 7.5 g/dL — ABNORMAL LOW (ref 12.0–15.0)
Potassium: 4 mmol/L (ref 3.5–5.1)
Potassium: 3.9 mmol/L (ref 3.5–5.1)
Potassium: 4.9 mmol/L (ref 3.5–5.1)
Potassium: 4.5 mmol/L (ref 3.5–5.1)
Potassium: 5.1 mmol/L (ref 3.5–5.1)
Sodium: 139 mmol/L (ref 135–145)
Sodium: 138 mmol/L (ref 135–145)
Sodium: 137 mmol/L (ref 135–145)
Sodium: 137 mmol/L (ref 135–145)
Sodium: 138 mmol/L (ref 135–145)
TCO2: 22 mmol/L (ref 22–32)
TCO2: 24 mmol/L (ref 22–32)
TCO2: 24 mmol/L (ref 22–32)
TCO2: 22 mmol/L (ref 22–32)
TCO2: 24 mmol/L (ref 22–32)

## 2019-08-17 LAB — GLUCOSE, CAPILLARY
Glucose-Capillary: 137 mg/dL — ABNORMAL HIGH (ref 70–99)
Glucose-Capillary: 169 mg/dL — ABNORMAL HIGH (ref 70–99)
Glucose-Capillary: 157 mg/dL — ABNORMAL HIGH (ref 70–99)
Glucose-Capillary: 161 mg/dL — ABNORMAL HIGH (ref 70–99)
Glucose-Capillary: 132 mg/dL — ABNORMAL HIGH (ref 70–99)
Glucose-Capillary: 152 mg/dL — ABNORMAL HIGH (ref 70–99)
Glucose-Capillary: 168 mg/dL — ABNORMAL HIGH (ref 70–99)
Glucose-Capillary: 142 mg/dL — ABNORMAL HIGH (ref 70–99)
Glucose-Capillary: 134 mg/dL — ABNORMAL HIGH (ref 70–99)

## 2019-08-17 LAB — POCT I-STAT 7, (LYTES, BLD GAS, ICA,H+H)
Acid-Base Excess: 3 mmol/L — ABNORMAL HIGH (ref 0.0–2.0)
Acid-Base Excess: 0 mmol/L (ref 0.0–2.0)
Acid-base deficit: 4 mmol/L — ABNORMAL HIGH (ref 0.0–2.0)
Acid-base deficit: 7 mmol/L — ABNORMAL HIGH (ref 0.0–2.0)
Acid-base deficit: 4 mmol/L — ABNORMAL HIGH (ref 0.0–2.0)
Bicarbonate: 27.2 mmol/L (ref 20.0–28.0)
Bicarbonate: 24 mmol/L (ref 20.0–28.0)
Bicarbonate: 21.2 mmol/L (ref 20.0–28.0)
Bicarbonate: 18.6 mmol/L — ABNORMAL LOW (ref 20.0–28.0)
Bicarbonate: 21 mmol/L (ref 20.0–28.0)
Calcium, Ion: 1 mmol/L — ABNORMAL LOW (ref 1.15–1.40)
Calcium, Ion: 0.97 mmol/L — ABNORMAL LOW (ref 1.15–1.40)
Calcium, Ion: 1.22 mmol/L (ref 1.15–1.40)
Calcium, Ion: 1.18 mmol/L (ref 1.15–1.40)
Calcium, Ion: 1.16 mmol/L (ref 1.15–1.40)
HCT: 18 % — ABNORMAL LOW (ref 36.0–46.0)
HCT: 20 % — ABNORMAL LOW (ref 36.0–46.0)
HCT: 31 % — ABNORMAL LOW (ref 36.0–46.0)
HCT: 30 % — ABNORMAL LOW (ref 36.0–46.0)
HCT: 31 % — ABNORMAL LOW (ref 36.0–46.0)
Hemoglobin: 6.1 g/dL — CL (ref 12.0–15.0)
Hemoglobin: 6.8 g/dL — CL (ref 12.0–15.0)
Hemoglobin: 10.5 g/dL — ABNORMAL LOW (ref 12.0–15.0)
Hemoglobin: 10.2 g/dL — ABNORMAL LOW (ref 12.0–15.0)
Hemoglobin: 10.5 g/dL — ABNORMAL LOW (ref 12.0–15.0)
O2 Saturation: 100 %
O2 Saturation: 100 %
O2 Saturation: 95 %
O2 Saturation: 94 %
O2 Saturation: 96 %
Patient temperature: 94.6
Patient temperature: 36.5
Patient temperature: 36.2
Potassium: 3.7 mmol/L (ref 3.5–5.1)
Potassium: 4.6 mmol/L (ref 3.5–5.1)
Potassium: 4.2 mmol/L (ref 3.5–5.1)
Potassium: 3.9 mmol/L (ref 3.5–5.1)
Potassium: 4 mmol/L (ref 3.5–5.1)
Sodium: 137 mmol/L (ref 135–145)
Sodium: 139 mmol/L (ref 135–145)
Sodium: 140 mmol/L (ref 135–145)
Sodium: 140 mmol/L (ref 135–145)
Sodium: 141 mmol/L (ref 135–145)
TCO2: 28 mmol/L (ref 22–32)
TCO2: 25 mmol/L (ref 22–32)
TCO2: 22 mmol/L (ref 22–32)
TCO2: 20 mmol/L — ABNORMAL LOW (ref 22–32)
TCO2: 22 mmol/L (ref 22–32)
pCO2 arterial: 40.7 mmHg (ref 32.0–48.0)
pCO2 arterial: 32.7 mmHg (ref 32.0–48.0)
pCO2 arterial: 33.9 mmHg (ref 32.0–48.0)
pCO2 arterial: 34.7 mmHg (ref 32.0–48.0)
pCO2 arterial: 36.4 mmHg (ref 32.0–48.0)
pH, Arterial: 7.432 (ref 7.350–7.450)
pH, Arterial: 7.473 — ABNORMAL HIGH (ref 7.350–7.450)
pH, Arterial: 7.394 (ref 7.350–7.450)
pH, Arterial: 7.334 — ABNORMAL LOW (ref 7.350–7.450)
pH, Arterial: 7.365 (ref 7.350–7.450)
pO2, Arterial: 340 mmHg — ABNORMAL HIGH (ref 83.0–108.0)
pO2, Arterial: 267 mmHg — ABNORMAL HIGH (ref 83.0–108.0)
pO2, Arterial: 66 mmHg — ABNORMAL LOW (ref 83.0–108.0)
pO2, Arterial: 72 mmHg — ABNORMAL LOW (ref 83.0–108.0)
pO2, Arterial: 83 mmHg (ref 83.0–108.0)

## 2019-08-17 LAB — APTT: aPTT: 43 seconds — ABNORMAL HIGH (ref 24–36)

## 2019-08-17 LAB — CBC
HCT: 35.3 % — ABNORMAL LOW (ref 36.0–46.0)
HCT: 32.2 % — ABNORMAL LOW (ref 36.0–46.0)
HCT: 35.6 % — ABNORMAL LOW (ref 36.0–46.0)
Hemoglobin: 11.4 g/dL — ABNORMAL LOW (ref 12.0–15.0)
Hemoglobin: 10.7 g/dL — ABNORMAL LOW (ref 12.0–15.0)
Hemoglobin: 12 g/dL (ref 12.0–15.0)
MCH: 30.6 pg (ref 26.0–34.0)
MCH: 30.8 pg (ref 26.0–34.0)
MCH: 30.9 pg (ref 26.0–34.0)
MCHC: 32.3 g/dL (ref 30.0–36.0)
MCHC: 33.2 g/dL (ref 30.0–36.0)
MCHC: 33.7 g/dL (ref 30.0–36.0)
MCV: 94.6 fL (ref 80.0–100.0)
MCV: 92.8 fL (ref 80.0–100.0)
MCV: 91.8 fL (ref 80.0–100.0)
Platelets: 165 10*3/uL (ref 150–400)
Platelets: 74 10*3/uL — ABNORMAL LOW (ref 150–400)
Platelets: 89 10*3/uL — ABNORMAL LOW (ref 150–400)
RBC: 3.73 MIL/uL — ABNORMAL LOW (ref 3.87–5.11)
RBC: 3.47 MIL/uL — ABNORMAL LOW (ref 3.87–5.11)
RBC: 3.88 MIL/uL (ref 3.87–5.11)
RDW: 12.7 % (ref 11.5–15.5)
RDW: 12.8 % (ref 11.5–15.5)
RDW: 13.1 % (ref 11.5–15.5)
WBC: 6.5 10*3/uL (ref 4.0–10.5)
WBC: 7.8 10*3/uL (ref 4.0–10.5)
WBC: 9.6 10*3/uL (ref 4.0–10.5)
nRBC: 0 % (ref 0.0–0.2)
nRBC: 0 % (ref 0.0–0.2)
nRBC: 0 % (ref 0.0–0.2)

## 2019-08-17 LAB — ECHO INTRAOPERATIVE TEE
Height: 61.5 in
Weight: 2577.6 oz

## 2019-08-17 LAB — BASIC METABOLIC PANEL
Anion gap: 9 (ref 5–15)
Anion gap: 10 (ref 5–15)
BUN: 22 mg/dL (ref 8–23)
BUN: 18 mg/dL (ref 8–23)
CO2: 24 mmol/L (ref 22–32)
CO2: 21 mmol/L — ABNORMAL LOW (ref 22–32)
Calcium: 9.5 mg/dL (ref 8.9–10.3)
Calcium: 8.3 mg/dL — ABNORMAL LOW (ref 8.9–10.3)
Chloride: 106 mmol/L (ref 98–111)
Chloride: 108 mmol/L (ref 98–111)
Creatinine, Ser: 1.37 mg/dL — ABNORMAL HIGH (ref 0.44–1.00)
Creatinine, Ser: 1.02 mg/dL — ABNORMAL HIGH (ref 0.44–1.00)
GFR calc Af Amer: 42 mL/min — ABNORMAL LOW (ref >60)
GFR calc Af Amer: >60 mL/min (ref >60)
GFR calc non Af Amer: 37 mL/min — ABNORMAL LOW (ref >60)
GFR calc non Af Amer: 52 mL/min — ABNORMAL LOW (ref >60)
Glucose, Bld: 115 mg/dL — ABNORMAL HIGH (ref 70–99)
Glucose, Bld: 164 mg/dL — ABNORMAL HIGH (ref 70–99)
Potassium: 4.1 mmol/L (ref 3.5–5.1)
Potassium: 4.3 mmol/L (ref 3.5–5.1)
Sodium: 139 mmol/L (ref 135–145)
Sodium: 139 mmol/L (ref 135–145)

## 2019-08-17 LAB — PROTIME-INR
INR: 1.7 — ABNORMAL HIGH (ref 0.8–1.2)
Prothrombin Time: 19.2 seconds — ABNORMAL HIGH (ref 11.4–15.2)

## 2019-08-17 LAB — HEMOGLOBIN AND HEMATOCRIT, BLOOD
HCT: 21.3 % — ABNORMAL LOW (ref 36.0–46.0)
Hemoglobin: 7 g/dL — ABNORMAL LOW (ref 12.0–15.0)

## 2019-08-17 LAB — SARS CORONAVIRUS 2 BY RT PCR (HOSPITAL ORDER, PERFORMED IN ~~LOC~~ HOSPITAL LAB): SARS Coronavirus 2: NEGATIVE

## 2019-08-17 LAB — MAGNESIUM: Magnesium: 3.4 mg/dL — ABNORMAL HIGH (ref 1.7–2.4)

## 2019-08-17 LAB — HEMOGLOBIN A1C
Hgb A1c MFr Bld: 5.5 % (ref 4.8–5.6)
Mean Plasma Glucose: 111.15 mg/dL

## 2019-08-17 LAB — PREPARE RBC (CROSSMATCH)

## 2019-08-17 LAB — PLATELET COUNT: Platelets: 70 10*3/uL — ABNORMAL LOW (ref 150–400)

## 2019-08-17 SURGERY — CORONARY ARTERY BYPASS GRAFTING (CABG)
Anesthesia: General | Site: Leg Upper | Laterality: Right

## 2019-08-17 MED ORDER — SODIUM CHLORIDE 0.9% FLUSH: 10.0000 mL | INTRAVENOUS | Status: DC | PRN

## 2019-08-17 MED ORDER — DOCUSATE SODIUM 100 MG PO CAPS
200.0000 mg | ORAL_CAPSULE | Freq: Every day | ORAL | Status: DC
  Administered 2019-08-18 – 2019-08-24 (×6): 200 mg via ORAL
  Filled 2019-08-17 (×7): qty 2

## 2019-08-17 MED ORDER — CHLORHEXIDINE GLUCONATE CLOTH 2 % EX PADS
6.0000 | MEDICATED_PAD | Freq: Every day | CUTANEOUS | Status: DC
  Administered 2019-08-17 – 2019-08-21 (×5): 6 via TOPICAL

## 2019-08-17 MED ORDER — PLASMA-LYTE 148 IV SOLN
INTRAVENOUS | Status: DC | PRN
  Administered 2019-08-17: 500 mL

## 2019-08-17 MED ORDER — ACETAMINOPHEN 650 MG RE SUPP
650.0000 mg | Freq: Once | RECTAL | Status: AC
  Administered 2019-08-17: 650 mg via RECTAL

## 2019-08-17 MED ORDER — ORAL CARE MOUTH RINSE
15.0000 mL | Freq: Two times a day (BID) | OROMUCOSAL | Status: DC
  Administered 2019-08-17 – 2019-08-24 (×8): 15 mL via OROMUCOSAL

## 2019-08-17 MED ORDER — ACETAMINOPHEN 500 MG PO TABS
1000.0000 mg | ORAL_TABLET | Freq: Four times a day (QID) | ORAL | Status: AC
  Administered 2019-08-17 – 2019-08-22 (×19): 1000 mg via ORAL
  Filled 2019-08-17 (×21): qty 2

## 2019-08-17 MED ORDER — SODIUM CHLORIDE 0.9 % IV SOLN
250.0000 mL | INTRAVENOUS | Status: DC
  Administered 2019-08-18: 250 mL via INTRAVENOUS

## 2019-08-17 MED ORDER — HEPARIN SODIUM (PORCINE) 1000 UNIT/ML IJ SOLN
INTRAMUSCULAR | Status: DC | PRN
  Administered 2019-08-17: 23000 [IU] via INTRAVENOUS

## 2019-08-17 MED ORDER — ORAL CARE MOUTH RINSE
15.0000 mL | OROMUCOSAL | Status: DC
  Administered 2019-08-17 (×3): 15 mL via OROMUCOSAL

## 2019-08-17 MED ORDER — EPHEDRINE SULFATE 50 MG/ML IJ SOLN
INTRAMUSCULAR | Status: DC | PRN
  Administered 2019-08-17: 5 mg via INTRAVENOUS

## 2019-08-17 MED ORDER — PROTAMINE SULFATE 10 MG/ML IV SOLN
INTRAVENOUS | Status: AC
  Filled 2019-08-17: qty 25

## 2019-08-17 MED ORDER — HEPARIN SODIUM (PORCINE) 1000 UNIT/ML IJ SOLN
INTRAMUSCULAR | Status: AC
  Filled 2019-08-17: qty 1

## 2019-08-17 MED ORDER — MORPHINE SULFATE (PF) 2 MG/ML IV SOLN
1.0000 mg | INTRAVENOUS | Status: DC | PRN
  Administered 2019-08-17: 1 mg via INTRAVENOUS
  Administered 2019-08-17 – 2019-08-18 (×2): 2 mg via INTRAVENOUS
  Filled 2019-08-17 (×3): qty 1

## 2019-08-17 MED ORDER — ALBUMIN HUMAN 5 % IV SOLN
250.0000 mL | INTRAVENOUS | Status: AC | PRN
  Administered 2019-08-17 (×2): 12.5 g via INTRAVENOUS

## 2019-08-17 MED ORDER — PHENYLEPHRINE 40 MCG/ML (10ML) SYRINGE FOR IV PUSH (FOR BLOOD PRESSURE SUPPORT)
PREFILLED_SYRINGE | INTRAVENOUS | Status: AC
  Filled 2019-08-17: qty 10

## 2019-08-17 MED ORDER — SODIUM BICARBONATE 8.4 % IV SOLN
50.0000 meq | Freq: Once | INTRAVENOUS | Status: AC
  Administered 2019-08-17: 50 meq via INTRAVENOUS

## 2019-08-17 MED ORDER — SODIUM CHLORIDE 0.9% FLUSH
3.0000 mL | Freq: Two times a day (BID) | INTRAVENOUS | Status: DC
  Administered 2019-08-18 – 2019-08-24 (×5): 3 mL via INTRAVENOUS

## 2019-08-17 MED ORDER — ALBUMIN HUMAN 5 % IV SOLN
INTRAVENOUS | Status: DC | PRN
Start: 2019-08-17 — End: 2019-08-17

## 2019-08-17 MED ORDER — BISACODYL 10 MG RE SUPP
10.0000 mg | Freq: Every day | RECTAL | Status: DC
  Filled 2019-08-17: qty 1

## 2019-08-17 MED ORDER — VANCOMYCIN HCL IN DEXTROSE 1-5 GM/200ML-% IV SOLN
1000.0000 mg | Freq: Once | INTRAVENOUS | Status: AC
  Administered 2019-08-17: 1000 mg via INTRAVENOUS
  Filled 2019-08-17: qty 200

## 2019-08-17 MED ORDER — TRAMADOL HCL 50 MG PO TABS
50.0000 mg | ORAL_TABLET | ORAL | Status: DC | PRN
  Administered 2019-08-17 – 2019-08-24 (×7): 50 mg via ORAL
  Filled 2019-08-17 (×7): qty 1

## 2019-08-17 MED ORDER — SODIUM CHLORIDE 0.9% FLUSH
10.0000 mL | Freq: Two times a day (BID) | INTRAVENOUS | Status: DC
  Administered 2019-08-17 – 2019-08-22 (×7): 10 mL

## 2019-08-17 MED ORDER — LACTATED RINGERS IV SOLN: 500.0000 mL | Freq: Once | INTRAVENOUS | Status: DC | PRN

## 2019-08-17 MED ORDER — LEVOFLOXACIN IN D5W 750 MG/150ML IV SOLN
750.0000 mg | INTRAVENOUS | Status: AC
  Administered 2019-08-18: 750 mg via INTRAVENOUS
  Filled 2019-08-17: qty 150

## 2019-08-17 MED ORDER — CHLORHEXIDINE GLUCONATE 0.12 % MT SOLN
15.0000 mL | OROMUCOSAL | Status: AC
  Administered 2019-08-17: 15 mL via OROMUCOSAL

## 2019-08-17 MED ORDER — PHENYLEPHRINE HCL-NACL 20-0.9 MG/250ML-% IV SOLN: 0.0000 ug/min | INTRAVENOUS | Status: DC

## 2019-08-17 MED ORDER — FENTANYL CITRATE (PF) 250 MCG/5ML IJ SOLN
INTRAMUSCULAR | Status: DC | PRN
  Administered 2019-08-17: 200 ug via INTRAVENOUS
  Administered 2019-08-17: 500 ug via INTRAVENOUS
  Administered 2019-08-17: 250 ug via INTRAVENOUS
  Administered 2019-08-17: 25 ug via INTRAVENOUS
  Administered 2019-08-17: 250 ug via INTRAVENOUS
  Administered 2019-08-17: 25 ug via INTRAVENOUS

## 2019-08-17 MED ORDER — DEXMEDETOMIDINE HCL IN NACL 400 MCG/100ML IV SOLN: 0.0000 ug/kg/h | INTRAVENOUS | Status: DC

## 2019-08-17 MED ORDER — SODIUM CHLORIDE 0.45 % IV SOLN: INTRAVENOUS | Status: DC | PRN

## 2019-08-17 MED ORDER — HEMOSTATIC AGENTS (NO CHARGE) OPTIME
TOPICAL | Status: DC | PRN
Start: 2019-08-17 — End: 2019-08-17
  Administered 2019-08-17 (×3): 1 via TOPICAL

## 2019-08-17 MED ORDER — LACTATED RINGERS IV SOLN: INTRAVENOUS | Status: DC | PRN

## 2019-08-17 MED ORDER — EPHEDRINE 5 MG/ML INJ
INTRAVENOUS | Status: AC
  Filled 2019-08-17: qty 10

## 2019-08-17 MED ORDER — ROCURONIUM BROMIDE 100 MG/10ML IV SOLN
INTRAVENOUS | Status: DC | PRN
  Administered 2019-08-17: 50 mg via INTRAVENOUS
  Administered 2019-08-17: 40 mg via INTRAVENOUS
  Administered 2019-08-17: 60 mg via INTRAVENOUS

## 2019-08-17 MED ORDER — METOPROLOL TARTRATE 12.5 MG HALF TABLET
12.5000 mg | ORAL_TABLET | Freq: Two times a day (BID) | ORAL | Status: DC
  Filled 2019-08-17: qty 1

## 2019-08-17 MED ORDER — FENTANYL CITRATE (PF) 250 MCG/5ML IJ SOLN
INTRAMUSCULAR | Status: AC
  Filled 2019-08-17: qty 25

## 2019-08-17 MED ORDER — ROCURONIUM BROMIDE 10 MG/ML (PF) SYRINGE
PREFILLED_SYRINGE | INTRAVENOUS | Status: AC
  Filled 2019-08-17: qty 10

## 2019-08-17 MED ORDER — NICARDIPINE HCL IN NACL 20-0.86 MG/200ML-% IV SOLN
5.0000 mg/h | INTRAVENOUS | Status: DC
  Administered 2019-08-17 (×3): 5 mg/h via INTRAVENOUS
  Filled 2019-08-17 (×4): qty 200

## 2019-08-17 MED ORDER — NITROGLYCERIN IN D5W 200-5 MCG/ML-% IV SOLN: 0.0000 ug/min | INTRAVENOUS | Status: DC

## 2019-08-17 MED ORDER — 0.9 % SODIUM CHLORIDE (POUR BTL) OPTIME
TOPICAL | Status: DC | PRN
  Administered 2019-08-17: 5000 mL

## 2019-08-17 MED ORDER — SODIUM CHLORIDE 0.9% FLUSH: 3.0000 mL | INTRAVENOUS | Status: DC | PRN

## 2019-08-17 MED ORDER — MAGNESIUM SULFATE 4 GM/100ML IV SOLN
INTRAVENOUS | Status: AC
  Administered 2019-08-17: 4 g via INTRAVENOUS
  Filled 2019-08-17: qty 100

## 2019-08-17 MED ORDER — MIDAZOLAM HCL 2 MG/2ML IJ SOLN: 2.0000 mg | INTRAMUSCULAR | Status: DC | PRN

## 2019-08-17 MED ORDER — ASPIRIN 81 MG PO CHEW
324.0000 mg | CHEWABLE_TABLET | Freq: Every day | ORAL | Status: DC
  Administered 2019-08-24: 324 mg
  Filled 2019-08-17 (×2): qty 4

## 2019-08-17 MED ORDER — SODIUM CHLORIDE 0.9% IV SOLUTION: Freq: Once | INTRAVENOUS | Status: DC

## 2019-08-17 MED ORDER — SODIUM CHLORIDE 0.9 % IV SOLN: INTRAVENOUS | Status: DC

## 2019-08-17 MED ORDER — FAMOTIDINE IN NACL 20-0.9 MG/50ML-% IV SOLN
20.0000 mg | Freq: Two times a day (BID) | INTRAVENOUS | Status: AC
  Administered 2019-08-17: 20 mg via INTRAVENOUS
  Filled 2019-08-17: qty 50

## 2019-08-17 MED ORDER — PREDNISONE 5 MG PO TABS
5.0000 mg | ORAL_TABLET | Freq: Every day | ORAL | Status: DC
  Administered 2019-08-18 – 2019-08-24 (×7): 5 mg via ORAL
  Filled 2019-08-17 (×7): qty 1

## 2019-08-17 MED ORDER — ACETAMINOPHEN 160 MG/5ML PO SOLN: 1000.0000 mg | Freq: Four times a day (QID) | ORAL | Status: AC

## 2019-08-17 MED ORDER — MAGNESIUM SULFATE 4 GM/100ML IV SOLN: 4.0000 g | Freq: Once | INTRAVENOUS | Status: AC

## 2019-08-17 MED ORDER — ONDANSETRON HCL 4 MG/2ML IJ SOLN
4.0000 mg | Freq: Four times a day (QID) | INTRAMUSCULAR | Status: DC | PRN
  Administered 2019-08-17: 4 mg via INTRAVENOUS
  Filled 2019-08-17: qty 2

## 2019-08-17 MED ORDER — OXYCODONE HCL 5 MG PO TABS
5.0000 mg | ORAL_TABLET | ORAL | Status: DC | PRN
  Administered 2019-08-17 – 2019-08-23 (×11): 5 mg via ORAL
  Filled 2019-08-17 (×12): qty 1

## 2019-08-17 MED ORDER — ACETAMINOPHEN 160 MG/5ML PO SOLN: 650.0000 mg | Freq: Once | ORAL | Status: AC

## 2019-08-17 MED ORDER — LACTATED RINGERS IV SOLN
INTRAVENOUS | Status: DC | PRN
Start: 2019-08-17 — End: 2019-08-17

## 2019-08-17 MED ORDER — POTASSIUM CHLORIDE 10 MEQ/50ML IV SOLN: 10.0000 meq | INTRAVENOUS | Status: AC

## 2019-08-17 MED ORDER — METOPROLOL TARTRATE 5 MG/5ML IV SOLN
2.5000 mg | INTRAVENOUS | Status: DC | PRN
  Administered 2019-08-19: 2.5 mg via INTRAVENOUS
  Filled 2019-08-17: qty 5

## 2019-08-17 MED ORDER — BISACODYL 5 MG PO TBEC
10.0000 mg | DELAYED_RELEASE_TABLET | Freq: Every day | ORAL | Status: DC
  Administered 2019-08-18 – 2019-08-22 (×5): 10 mg via ORAL
  Filled 2019-08-17 (×7): qty 2

## 2019-08-17 MED ORDER — PANTOPRAZOLE SODIUM 40 MG PO TBEC
40.0000 mg | DELAYED_RELEASE_TABLET | Freq: Every day | ORAL | Status: DC
  Administered 2019-08-19 – 2019-08-24 (×6): 40 mg via ORAL
  Filled 2019-08-17 (×6): qty 1

## 2019-08-17 MED ORDER — PROPOFOL 10 MG/ML IV BOLUS
INTRAVENOUS | Status: AC
  Filled 2019-08-17: qty 20

## 2019-08-17 MED ORDER — METOPROLOL TARTRATE 25 MG/10 ML ORAL SUSPENSION: 12.5000 mg | Freq: Two times a day (BID) | ORAL | Status: DC

## 2019-08-17 MED ORDER — PROTAMINE SULFATE 10 MG/ML IV SOLN
INTRAVENOUS | Status: DC | PRN
  Administered 2019-08-17 (×2): 50 mg via INTRAVENOUS
  Administered 2019-08-17: 30 mg via INTRAVENOUS
  Administered 2019-08-17 (×2): 50 mg via INTRAVENOUS

## 2019-08-17 MED ORDER — CHLORHEXIDINE GLUCONATE 0.12% ORAL RINSE (MEDLINE KIT): 15.0000 mL | Freq: Two times a day (BID) | OROMUCOSAL | Status: DC

## 2019-08-17 MED ORDER — LACTATED RINGERS IV SOLN: INTRAVENOUS | Status: DC

## 2019-08-17 MED ORDER — MIDAZOLAM HCL (PF) 10 MG/2ML IJ SOLN
INTRAMUSCULAR | Status: AC
  Filled 2019-08-17: qty 2

## 2019-08-17 MED ORDER — MIDAZOLAM HCL 5 MG/5ML IJ SOLN
INTRAMUSCULAR | Status: DC | PRN
  Administered 2019-08-17: 1 mg via INTRAVENOUS
  Administered 2019-08-17: 3 mg via INTRAVENOUS
  Administered 2019-08-17: 1 mg via INTRAVENOUS

## 2019-08-17 MED ORDER — FAMOTIDINE IN NACL 20-0.9 MG/50ML-% IV SOLN
INTRAVENOUS | Status: AC
  Administered 2019-08-17: 20 mg via INTRAVENOUS
  Filled 2019-08-17: qty 50

## 2019-08-17 MED ORDER — PROPOFOL 10 MG/ML IV BOLUS
INTRAVENOUS | Status: DC | PRN
  Administered 2019-08-17 (×3): 20 mg via INTRAVENOUS

## 2019-08-17 MED ORDER — ASPIRIN EC 325 MG PO TBEC
325.0000 mg | DELAYED_RELEASE_TABLET | Freq: Every day | ORAL | Status: DC
  Administered 2019-08-18 – 2019-08-23 (×6): 325 mg via ORAL
  Filled 2019-08-17 (×7): qty 1

## 2019-08-17 MED ORDER — INSULIN REGULAR(HUMAN) IN NACL 100-0.9 UT/100ML-% IV SOLN: INTRAVENOUS | Status: DC

## 2019-08-17 MED ORDER — DEXTROSE 50 % IV SOLN: 0.0000 mL | INTRAVENOUS | Status: DC | PRN

## 2019-08-17 SURGICAL SUPPLY — 88 items
BAG DECANTER FOR FLEXI CONT (MISCELLANEOUS) ×5 IMPLANT
BLADE CLIPPER SURG (BLADE) IMPLANT
BLADE STERNUM SYSTEM 6 (BLADE) ×5 IMPLANT
BLADE SURG SZ11 CARB STEEL (BLADE) ×5 IMPLANT
BNDG ELASTIC 4X5.8 VLCR STR LF (GAUZE/BANDAGES/DRESSINGS) ×5 IMPLANT
BNDG ELASTIC 6X5.8 VLCR STR LF (GAUZE/BANDAGES/DRESSINGS) ×5 IMPLANT
BNDG GAUZE ELAST 4 BULKY (GAUZE/BANDAGES/DRESSINGS) ×5 IMPLANT
CABLE SURGICAL S-101-97-12 (CABLE) ×5 IMPLANT
CANISTER SUCT 3000ML PPV (MISCELLANEOUS) ×5 IMPLANT
CANNULA EZ GLIDE 8.0 24FR (CANNULA) IMPLANT
CANNULA MC2 2 STG 29/37 NON-V (CANNULA) ×3 IMPLANT
CANNULA MC2 TWO STAGE (CANNULA) ×5
CANNULA NON VENT 20FR 12 (CANNULA) ×5 IMPLANT
CATH ROBINSON RED A/P 18FR (CATHETERS) ×10 IMPLANT
CLIP FOGARTY SPRING 6M (CLIP) ×5 IMPLANT
CLIP RETRACTION 3.0MM CORONARY (MISCELLANEOUS) IMPLANT
CLIP VESOCCLUDE MED 24/CT (CLIP) ×5 IMPLANT
CLIP VESOCCLUDE SM WIDE 24/CT (CLIP) IMPLANT
CONN ST 1/2X1/2  BEN (MISCELLANEOUS)
CONN ST 1/2X1/2 BEN (MISCELLANEOUS) IMPLANT
CONNECTOR BLAKE 2:1 CARIO BLK (MISCELLANEOUS) ×5 IMPLANT
DRAIN CHANNEL 19F RND (DRAIN) ×10 IMPLANT
DRAIN CONNECTOR BLAKE 1:1 (MISCELLANEOUS) IMPLANT
DRAPE CARDIOVASCULAR INCISE (DRAPES) ×5
DRAPE INCISE IOBAN 66X45 STRL (DRAPES) IMPLANT
DRAPE SLUSH/WARMER DISC (DRAPES) ×5 IMPLANT
DRAPE SRG 135X102X78XABS (DRAPES) ×3 IMPLANT
DRSG COVADERM 4X14 (GAUZE/BANDAGES/DRESSINGS) ×5 IMPLANT
ELECT REM PT RETURN 9FT ADLT (ELECTROSURGICAL) ×10
ELECTRODE REM PT RTRN 9FT ADLT (ELECTROSURGICAL) ×6 IMPLANT
FELT TEFLON 1X6 (MISCELLANEOUS) ×5 IMPLANT
GAUZE SPONGE 4X4 12PLY STRL (GAUZE/BANDAGES/DRESSINGS) ×10 IMPLANT
GAUZE SPONGE 4X4 12PLY STRL LF (GAUZE/BANDAGES/DRESSINGS) ×10 IMPLANT
GLOVE BIO SURGEON STRL SZ 6 (GLOVE) ×5 IMPLANT
GLOVE BIO SURGEON STRL SZ 6.5 (GLOVE) ×4 IMPLANT
GLOVE BIO SURGEON STRL SZ7 (GLOVE) ×15 IMPLANT
GLOVE BIO SURGEON STRL SZ8 (GLOVE) ×10 IMPLANT
GLOVE BIO SURGEONS STRL SZ 6.5 (GLOVE) ×1
GLOVE BIOGEL M STRL SZ7.5 (GLOVE) IMPLANT
GLOVE BIOGEL PI IND STRL 6 (GLOVE) ×15 IMPLANT
GLOVE BIOGEL PI IND STRL 9 (GLOVE) ×3 IMPLANT
GLOVE BIOGEL PI INDICATOR 6 (GLOVE) ×10
GLOVE BIOGEL PI INDICATOR 9 (GLOVE) ×2
GOWN STRL REUS W/ TWL LRG LVL3 (GOWN DISPOSABLE) ×30 IMPLANT
GOWN STRL REUS W/ TWL XL LVL3 (GOWN DISPOSABLE) ×6 IMPLANT
GOWN STRL REUS W/TWL LRG LVL3 (GOWN DISPOSABLE) ×50
GOWN STRL REUS W/TWL XL LVL3 (GOWN DISPOSABLE) ×10
HEMOSTAT POWDER SURGIFOAM 1G (HEMOSTASIS) ×15 IMPLANT
KIT BASIN OR (CUSTOM PROCEDURE TRAY) ×5 IMPLANT
KIT DRAINAGE VACCUM ASSIST (KITS) ×5 IMPLANT
KIT SUCTION CATH 14FR (SUCTIONS) ×5 IMPLANT
KIT TURNOVER KIT B (KITS) ×5 IMPLANT
KIT VASOVIEW HEMOPRO 2 VH 4000 (KITS) ×5 IMPLANT
LEAD PACING MYOCARDI (MISCELLANEOUS) IMPLANT
MARKER GRAFT CORONARY BYPASS (MISCELLANEOUS) ×15 IMPLANT
NS IRRIG 1000ML POUR BTL (IV SOLUTION) ×25 IMPLANT
PACK ACCESSORY CANNULA KIT (KITS) ×5 IMPLANT
PACK E OPEN HEART (SUTURE) ×5 IMPLANT
PACK OPEN HEART (CUSTOM PROCEDURE TRAY) ×5 IMPLANT
PAD ARMBOARD 7.5X6 YLW CONV (MISCELLANEOUS) ×10 IMPLANT
PAD ELECT DEFIB RADIOL ZOLL (MISCELLANEOUS) ×5 IMPLANT
PENCIL BUTTON HOLSTER BLD 10FT (ELECTRODE) ×5 IMPLANT
POSITIONER HEAD DONUT 9IN (MISCELLANEOUS) ×5 IMPLANT
PUNCH AORTIC ROTATE 4.0MM (MISCELLANEOUS) ×5 IMPLANT
SET CARDIOPLEGIA MPS 5001102 (MISCELLANEOUS) ×5 IMPLANT
SPONGE LAP 18X18 RF (DISPOSABLE) ×15 IMPLANT
STAPLER VISISTAT 35W (STAPLE) ×5 IMPLANT
SUPPORT HEART JANKE-BARRON (MISCELLANEOUS) ×5 IMPLANT
SUT BONE WAX W31G (SUTURE) ×5 IMPLANT
SUT ETHIBOND X763 2 0 SH 1 (SUTURE) ×10 IMPLANT
SUT MNCRL AB 3-0 PS2 18 (SUTURE) ×10 IMPLANT
SUT MNCRL AB 4-0 PS2 18 (SUTURE) ×10 IMPLANT
SUT PDS AB 1 CTX 36 (SUTURE) ×10 IMPLANT
SUT PROLENE 4 0 SH DA (SUTURE) ×5 IMPLANT
SUT PROLENE 5 0 C 1 36 (SUTURE) ×20 IMPLANT
SUT PROLENE 7 0 BV1 MDA (SUTURE) ×10 IMPLANT
SUT STEEL 6MS V (SUTURE) ×10 IMPLANT
SUT VIC AB 2-0 CT1 27 (SUTURE) ×10
SUT VIC AB 2-0 CT1 TAPERPNT 27 (SUTURE) ×6 IMPLANT
SYSTEM SAHARA CHEST DRAIN ATS (WOUND CARE) ×5 IMPLANT
TAPE CLOTH SURG 4X10 WHT LF (GAUZE/BANDAGES/DRESSINGS) ×5 IMPLANT
TOWEL GREEN STERILE (TOWEL DISPOSABLE) ×5 IMPLANT
TOWEL GREEN STERILE FF (TOWEL DISPOSABLE) ×5 IMPLANT
TRAY FOLEY SLVR 16FR TEMP STAT (SET/KITS/TRAYS/PACK) ×5 IMPLANT
TUBING LAP HI FLOW INSUFFLATIO (TUBING) ×5 IMPLANT
UNDERPAD 30X36 HEAVY ABSORB (UNDERPADS AND DIAPERS) ×5 IMPLANT
WATER STERILE IRR 1000ML POUR (IV SOLUTION) ×10 IMPLANT
YANKAUER SUCT BULB TIP NO VENT (SUCTIONS) ×5 IMPLANT

## 2019-08-17 NOTE — Op Note (Signed)
[address]       [place] [address]             [phone]                                          08/17/2019 Patient:  Nicole Meyer Pre-Op Dx: 3V CAD   Unstable angina   Post-op Dx:  same Procedure: CABG X 4.  LIMA LAD, RSVG PDA, OM2, D1   Endoscopic greater saphenous vein harvest on the right Intra-operative Transesophageal Echocardiogram  Surgeon and Role:      * Tomio Kirk, Lucile Crater, MD - Primary    Josie Saunders, PA-C - assisting Anesthesia  general EBL:  549ml Blood Administration: 3 units of pRBCs Xclamp Time:  58 min Pump Time:  109 min  Drains: 19 F blake drain: L, mediastinal  Wires: none Counts: correct   Indications: This is a 79 year old female who presents for an elective left heart cath and was found to have severe three-vessel disease.  Her echocardiogram shows preserved LV and RV function and no significant valvular disease.  She has had a stroke in the past but has no significant residual deficits except for lower extremity weakness which may also be attributed to her right knee replacement and left knee pain.  She has been on Plavix up until 08/12/2019 for her stroke, but her P2 Y 12 was normal.   Findings: Small LIMA, good vein.  Good LAD, intramyocardial.  Small Diagonal branch.  Good marginal, and PDA vessels.    Operative Technique: All invasive lines were placed in pre-op holding.  After the risks, benefits and alternatives were thoroughly discussed, the patient was brought to the operative theatre.  Anesthesia was induced, and the patient was prepped and draped in normal sterile fashion.  An appropriate surgical pause was performed, and pre-operative antibiotics were dosed accordingly.  We began with simultaneous incisions were made along the right leg for harvesting of the greater saphenous vein and the chest for the sternotomy.  In regards to the sternotomy, this was carried down with bovie cautery, and the sternum  was divided with a reciprocating saw.  Meticulous hemostasis was obtained.  The left internal thoracic artery was exposed and harvested in in pedicled fashion.  The patient was systemically heparinized, and the artery was divided distally, and placed in a papaverine sponge.    The sternal elevator was removed, and a retractor was placed.  The pericardium was divided in the midline and fashioned into a cradle with pericardial stitches.   After we confirmed an appropriate ACT, the ascending aorta was cannulated in standard fashion.  The right atrial appendage was used for venous cannulation site.  Cardiopulmonary bypass was initiated, and the heart retractor was placed. The cross clamp was applied, and a dose of anterograde cardioplegia was given with good arrest of the heart.  We moved to the posterior wall of the heart, and found a good target on the PDA.  An arteriotomy was made, and the vein graft was anastomosed to it in an end to side fashion.  Next we exposed the lateral wall, and found a good target on the OM.  An end to side anastomosis with the vein graft was then created.  Next, we exposed the anterior wall of the heart and identified a good target on 2nd diagonal  branch.   An arteriotomy was created.  The vein was anastomosed in an end to side fashion.  Finally, we exposed a good target on the  LAD, and fashioned an end to side anastomosis between it and the LITA.  We began to re-warm, and a re-animation dose of cardioplegia was given.  The heart was de-aired, and the cross clamp was removed.  Meticulous hemostasis was obtained.    A partial occludding clamp was then placed on the ascending aorta, and we created an end to side anastomosis between it and the proximal vein grafts.  The proximal sites marked with rings.  Hemostasis was obtained, and we separated from cardiopulmonary bypass without event.the heparin was reversed with protamine.  Chest tubes and wires were placed, and the sternum was  re-approximated with with sternal wires.  The soft tissue and skin were re-approximated wth absorbable suture.    The patient tolerated the procedure without any immediate complications, and was transferred to the ICU in guarded condition.  Nicole Meyer Nicole Meyer

## 2019-08-17 NOTE — Brief Op Note (Signed)
08/12/2019 - 08/17/2019  11:13 AM  PATIENT:  Real Cons Habenicht  79 y.o. female  PRE-OPERATIVE DIAGNOSIS:  Coronary artery disease  POST-OPERATIVE DIAGNOSIS:  Coronary artery disease  PROCEDURE:  TRANSESOPHAGEAL ECHOCARDIOGRAM (TEE), CORONARY ARTERY BYPASS GRAFTING (CABG) x 4 (LIMA to LAD, SVG to DIAGONAL, SVG to OM2, SVG to PDA) with ENDOVEIN HARVEST OF GREATER SAPHENOUS VEIN (Right)  SURGEON:  Surgeon(s) and Role:    Lightfoot, Lucile Crater, MD - Primary  PHYSICIAN ASSISTANT: Lars Pinks PA-C  ANESTHESIA:   general  EBL:  Per anesthesia and perfusion record  DRAINS: Chest tubes placed in the mediastinal and pleural spaces   COUNTS CORRECT:  YES  DICTATION: .Dragon Dictation  PLAN OF CARE: Admit to inpatient   PATIENT DISPOSITION:  ICU - intubated and hemodynamically stable.   Delay start of Pharmacological VTE agent (>24hrs) due to surgical blood loss or risk of bleeding: yes  BASELINE WEIGHT: 75 kg

## 2019-08-17 NOTE — Anesthesia Procedure Notes (Signed)
Central Venous Catheter Insertion Performed by: Effie Berkshire, MD, anesthesiologist Start/End6/29/2021 6:45 AM, 08/17/2019 6:55 AM Patient location: Pre-op. Preanesthetic checklist: patient identified, IV checked, site marked, risks and benefits discussed, surgical consent, monitors and equipment checked, pre-op evaluation, timeout performed and anesthesia consent Position: Trendelenburg Lidocaine 1% used for infiltration and patient sedated Hand hygiene performed , maximum sterile barriers used  and Seldinger technique used Catheter size: 8 Fr Total catheter length 16. Central line was placed.Double lumen Procedure performed using ultrasound guided technique. Ultrasound Notes:anatomy identified, needle tip was noted to be adjacent to the nerve/plexus identified, no ultrasound evidence of intravascular and/or intraneural injection and image(s) printed for medical record Attempts: 1 Following insertion, dressing applied, line sutured and Biopatch. Post procedure assessment: blood return through all ports  Patient tolerated the procedure well with no immediate complications.

## 2019-08-17 NOTE — Progress Notes (Signed)
CT surgery p.m. Rounds  Hemodynamic stable after multivessel CABG earlier today Patient recently extubated after she met parameters for rapid wean 6-hour postop labs pending  Blood pressure (!) 112/55, pulse 64, temperature 98.4 F (36.9 C), resp. rate 18, height 5' 1.5" (1.562 m), weight 73.1 kg, SpO2 96 %.

## 2019-08-17 NOTE — Progress Notes (Signed)
     [address]       [place] [address]             [phone]       No events overnight  Vitals:   08/17/19 0433 08/17/19 0455  BP: (!) 171/66   Pulse: (!) 59 63  Resp: 16   Temp: 98.6 F (37 C)   SpO2: 96%     Alert NAD Sinus brady EWOB  79 yo female with 3v CAD, and stable angina OR today for CABG 4.  Tais Koestner [name]

## 2019-08-17 NOTE — Anesthesia Procedure Notes (Signed)
Procedure Name: Intubation Date/Time: 08/17/2019 8:31 AM Performed by: Makhai Fulco T, CRNA Pre-anesthesia Checklist: Patient identified, Emergency Drugs available, Suction available and Patient being monitored Patient Re-evaluated:Patient Re-evaluated prior to induction Oxygen Delivery Method: Circle system utilized Preoxygenation: Pre-oxygenation with 100% oxygen Induction Type: IV induction Ventilation: Mask ventilation without difficulty and Oral airway inserted - appropriate to patient size Laryngoscope Size: Mac and 3 Grade View: Grade I Tube type: Oral Tube size: 8.0 mm Number of attempts: 1 Airway Equipment and Method: Stylet and Oral airway Placement Confirmation: ETT inserted through vocal cords under direct vision,  positive ETCO2 and breath sounds checked- equal and bilateral Secured at: 21 cm Tube secured with: Tape Dental Injury: Teeth and Oropharynx as per pre-operative assessment

## 2019-08-17 NOTE — Anesthesia Procedure Notes (Signed)
Arterial Line Insertion Start/End6/29/2021 6:40 AM, 08/17/2019 6:45 AM Performed by: Annye Asa, MD, CRNA  Patient location: Pre-op. Preanesthetic checklist: patient identified, IV checked, site marked, risks and benefits discussed, surgical consent, monitors and equipment checked, pre-op evaluation, timeout performed and anesthesia consent Lidocaine 1% used for infiltration Left, radial was placed Catheter size: 20 Fr Hand hygiene performed  and maximum sterile barriers used   Attempts: 1 Procedure performed without using ultrasound guided technique. Following insertion, dressing applied. Post procedure assessment: normal and unchanged  Patient tolerated the procedure well with no immediate complications.

## 2019-08-17 NOTE — Discharge Summary (Signed)
Physician Discharge Summary       [address]       [place] [address]             [phone]    Patient ID: Nicole Meyer MRN: [id number] DOB/AGE: 80-06-1940 80 y.o.  Admit date: 08/12/2019 Discharge date: 08/24/2019  Admission Diagnoses: Coronary artery disease  Discharge Diagnoses:  1. S/p CABG x 4 2. Expected post op blood loss anemia 3. History of Hypercholesteremia 4. History of hiatal hernia 5. History of GERD (gastroesophageal reflux disease) 6. History of COPD (chronic obstructive pulmonary disease) ([facility] [facility]) 7. History of chronic kidney disease (CKD), stage III (moderate) 8. History of skin cancer ([place]) 9. History of anxiety 10. History of IBS (irritable bowel syndrome) 11. History of PMR (polymyalgia rheumatica) ([facility]) 12. History of Osteoarthritis 13. History of Osteoporosis 14. History of hypertension 15. History of tobacco abuse  Consults: None  Procedure (s):  CABG X 4.  LIMA LAD, RSVG PDA, OM2, D1   Endoscopic greater saphenous vein harvest on the right Intra-operative Transesophageal Echocardiogram by Dr. Kipp Brood on 08/17/2019.  History of Presenting Illness: This is a 79 year old female who presents for an elective left heart cath.  She has had a several month history of exertional angina and a history of a cerebrovascular events in January 2020.  Her anginal symptoms have recently worsened over the past few months.  She underwent a left heart cath on August 12, 2019 which showed three-vessel coronary disease.  Cardiothoracic surgery was consulted to assist with management.  She currently denies any chest pain, but has some nausea from what she thinks is warranted medications. She has had a stroke in the past but has no significant residual deficits except for lower extremity weakness which may also be attributed to her right knee replacement and left knee pain.  She has been on Plavix up until 08/12/2019 for her stroke, but her P2 Y 12 was  normal.  Pre operative duplex US showed no significant internal carotid artery stenosis bilaterally. She underwent a CABG x 4 on 08/17/2019.   Brief Hospital Course:   She tolerated her CABG x 4 without difficulty.  She was transferred to the SICU in stable condition.  She was extubated the evening of surgery without difficulty.  During her stay in the SICU the patients chest tubes and arterial lines were removed without difficulty.  She was started on diuretics for hypervolemia.  She was maintaining NSR and was felt stable for transfer to the progressive care unit on 08/18/2019.  She continued to make good progress.  She was hypertensive and started on Norvasc for additional control.  She was evaluated by PT who recommended SNF following discharge  This has been arranged.  She remains in NSR.  She is ambulating with assistance.  As discussed with Dr. Kipp Brood, will restart Plavix and decrease ec asa once in follow up in the office. Her incisions are healing without evidence of infection.  We ask the SNF to please remove RLE staples on Sunday 08/29/2019. She is medically stable for discharge to SNF today.       We ask the SNF to please do the following: 1. Please obtain vital signs at least one time daily 2.Please weigh the patient daily. If he or she continues to gain weight or develops lower extremity edema, contact the office at [phone]. 3. Ambulate patient at least three times daily and please use sternal precautions.  Latest Vital Signs: Blood pressure [name])  150/66, pulse 80, temperature 98.7 F (37.1 C), temperature source Oral, resp. rate (!) 21, height 5' 1.5" (1.562 m), weight 74.4 kg, SpO2 94 %.  Physical Exam: Cardiovascular: RRR Pulmonary: Clear to auscultation bilaterally Abdomen: Soft, non tender, bowel sounds present. Extremities: Trace bilateral lower extremity edema. Ecchymosis right LE Wounds: Clean and dry.  No erythema or signs of infection.   Discharge Condition:  Stable and discharged to SNF.  Recent laboratory studies:  Lab Results  Component Value Date   WBC 5.8 08/22/2019   HGB 11.2 (L) 08/22/2019   HCT 34.0 (L) 08/22/2019   MCV 94.7 08/22/2019   PLT 145 (L) 08/22/2019   Lab Results  Component Value Date   NA 134 (L) 08/24/2019   K 3.7 08/24/2019   CL 102 08/24/2019   CO2 23 08/24/2019   CREATININE 1.24 (H) 08/24/2019   GLUCOSE 127 (H) 08/24/2019      Diagnostic Studies: DG Chest 2 View  Result Date: 08/22/2019 CLINICAL DATA:  79 year old female under postoperative evaluation. EXAM: CHEST - 2 VIEW COMPARISON:  Chest x-ray 08/19/2019. FINDINGS: Lung volumes are normal. Atelectasis and/or consolidation in the left lower lobe. Small bilateral pleural effusions. No evidence of pulmonary edema. No pneumothorax. Heart size is normal. Upper mediastinal contours are within normal limits. Aortic atherosclerosis. Status post CABG. IMPRESSION: 1. Persistent atelectasis and/or consolidation in the left lower lobe. 2. Small bilateral pleural effusions. 3. Aortic atherosclerosis. Electronically Signed   By: Vinnie Langton [name]   On: 08/22/2019 08:04   DG Chest 2 View  Result Date: 08/16/2019 CLINICAL DATA:  Preop testing for surgery tomorrow. EXAM: CHEST - 2 VIEW COMPARISON:  02/27/2018 FINDINGS: The heart size and mediastinal contours are within normal limits. Both lungs are clear. The visualized skeletal structures are unremarkable. IMPRESSION: No active cardiopulmonary disease. Electronically Signed   By: Kathreen Devoid   On: 08/16/2019 09:13   CARDIAC CATHETERIZATION  Result Date: 08/12/2019  Prox Cx lesion is 70% stenosed.  Mid LAD lesion is 75% stenosed.  2nd Diag lesion is 95% stenosed.  Prox RCA lesion is 80% stenosed.  Mid RCA lesion is 80% stenosed.  LV end diastolic pressure is normal.  There is no aortic valve stenosis.  Three vessel CAD. Cardiac surgery consult.  Plan to admit since she is having unstable/ accelerating symptoms.  Will  need to consider adding IV heparin if radial site is ok, and as plavix wears off.  Will check echo and hydrate post procedure.   DG Chest Port 1 View  Result Date: 08/19/2019 CLINICAL DATA:  CABG.  History pneumothorax. EXAM: PORTABLE CHEST 1 VIEW COMPARISON:  08/18/2019. FINDINGS: Interim removal of right IJ line and mediastinal drainage catheter. Prior CABG. Cardiomegaly. No pulmonary venous congestion. Left base atelectasis/infiltrate again noted. Small left pleural effusion again noted. No pneumothorax identified. IMPRESSION: 1. Interim removal of right IJ line and mediastinal drainage catheter. No pneumothorax. 2. Prior CABG. Stable cardiomegaly. No pulmonary venous congestion. 3. Persistent left base atelectasis/infiltrate small left pleural effusion. Similar findings noted on prior exam. Electronically Signed   By: [place]   On: 08/19/2019 07:06   DG Chest Port 1 View  Result Date: 08/18/2019 CLINICAL DATA:  Bypass surgery. EXAM: PORTABLE CHEST 1 VIEW COMPARISON:  08/17/2019 FINDINGS: The a tracheal tube and NG tubes have been removed. The right IJ catheter is stable. Mediastinal drain tubes are stable. Persistent left basilar atelectasis and probable small left effusion. The right lung remains clear. No pneumothorax is identified. IMPRESSION:  1. Removal of ET and NG tubes. 2. Persistent left basilar atelectasis and probable small left effusion. Electronically Signed   By: Marijo Sanes [name]   On: 08/18/2019 09:04   DG Chest Port 1 View  Result Date: 08/17/2019 CLINICAL DATA:  Status post coronary bypass grafting EXAM: PORTABLE CHEST 1 VIEW COMPARISON:  08/16/2019 FINDINGS: Postsurgical changes are now seen. Endotracheal tube, gastric catheter and right jugular line are seen. Gastric catheter extends to just above the gastroesophageal junction and could be advanced several cm deeper to reach the stomach. A mediastinal drain and left thoracostomy tube are seen as well. Lungs are well  aerated bilaterally. Patchy atelectatic changes in the left base are seen. No pneumothorax is noted. IMPRESSION: Tubes and lines as described above. The gastric catheter could be advanced several cm to reach the stomach. Left basilar airspace opacity likely related atelectasis. Electronically Signed   By: Inez Catalina [name]   On: 08/17/2019 13:24   VAS [place] LOWER EXTREMITY SAPHENOUS VEIN MAPPING  Result Date: 08/17/2019 LOWER EXTREMITY VEIN MAPPING Indications:  Pre-op Risk Factors: Coronary artery disease.  Comparison Study: No prior study on file Performing Technologist: Sharion Dove RVS  Examination Guidelines: A complete evaluation includes B-mode imaging, spectral Doppler, color Doppler, and power Doppler as needed of all accessible portions of each vessel. Bilateral testing is considered an integral part of a complete examination. Limited examinations for reoccurring indications may be performed as noted. +---------------+-----------+----------------------+---------------+-----------+   RT Diameter  RT Findings         GSV            LT Diameter  LT Findings      (cm)                                            (cm)                  +---------------+-----------+----------------------+---------------+-----------+      0.46                     Saphenofemoral         0.61                                                   Junction                                  +---------------+-----------+----------------------+---------------+-----------+      0.42                     Proximal thigh         0.43                  +---------------+-----------+----------------------+---------------+-----------+      0.37       branching       Mid thigh            0.52                  +---------------+-----------+----------------------+---------------+-----------+      0.24       branching      Distal thigh  0.44                   +---------------+-----------+----------------------+---------------+-----------+      0.30       branching          Knee                                    +---------------+-----------+----------------------+---------------+-----------+      0.31                       Prox calf            0.34                  +---------------+-----------+----------------------+---------------+-----------+      0.26                        Mid calf            0.26       branching  +---------------+-----------+----------------------+---------------+-----------+      0.27                      Distal calf           0.24                  +---------------+-----------+----------------------+---------------+-----------+      0.20       branching         Ankle              0.13                  +---------------+-----------+----------------------+---------------+-----------+ Diagnosing physician: Deitra Mayo MD Electronically signed by Deitra Mayo MD on 08/17/2019 at 8:53:59 AM.    Final    ECHOCARDIOGRAM COMPLETE  Result Date: 08/12/2019    ECHOCARDIOGRAM REPORT   Patient Name:   [name] Date of Exam: 08/12/2019 Medical Rec #:  [id number]         Height:       61.5 in Accession #:    [id number]        Weight:       171.0 lb Date of Birth:  May 23, 1940          BSA:          1.778 m Patient Age:    30 years          BP:           136/55 mmHg Patient Gender: F                 HR:           51 bpm. Exam Location:  Inpatient Procedure: 2D Echo Indications:    CAD 414. 01  History:        Patient has prior history of Echocardiogram examinations, most                 recent 02/28/2018. CAD, COPD; Risk Factors:Hypertension,                 Dyslipidemia and Former Smoker.  Sonographer:    Jannett Celestine RDCS (AE) Referring Phys: [employer]  1. Left ventricular ejection fraction, by estimation, is 60 to 65%. The left ventricle has normal function. The left ventricle has no  regional wall motion abnormalities. There is  mild left ventricular hypertrophy. Left ventricular diastolic parameters are consistent with Grade I diastolic dysfunction (impaired relaxation).  2. Right ventricular systolic function is normal. The right ventricular size is normal.  3. The mitral valve is abnormal. Moderate mitral annular calcification. No evidence of mitral valve regurgitation.  4. The aortic valve was not well visualized. Aortic valve regurgitation is not visualized. No aortic stenosis is present.  5. The inferior vena cava is normal in size with greater than 50% respiratory variability, suggesting right atrial pressure of 3 mmHg. FINDINGS  Left Ventricle: Left ventricular ejection fraction, by estimation, is 60 to 65%. The left ventricle has normal function. The left ventricle has no regional wall motion abnormalities. The left ventricular internal cavity size was normal in size. There is  mild left ventricular hypertrophy. Left ventricular diastolic parameters are consistent with Grade I diastolic dysfunction (impaired relaxation). Right Ventricle: The right ventricular size is normal. Right vetricular wall thickness was not assessed. Right ventricular systolic function is normal. Left Atrium: Left atrial size was normal in size. Right Atrium: Right atrial size was normal in size. Pericardium: There is no evidence of pericardial effusion. Mitral Valve: The mitral valve is abnormal. Moderate mitral annular calcification. No evidence of mitral valve regurgitation. Tricuspid Valve: The tricuspid valve is normal in structure. Tricuspid valve regurgitation is trivial. Aortic Valve: The aortic valve was not well visualized. Aortic valve regurgitation is not visualized. No aortic stenosis is present. Pulmonic Valve: The pulmonic valve was not well visualized. Pulmonic valve regurgitation is not visualized. Aorta: The aortic root is normal in size and structure. Venous: The inferior vena cava is normal in  size with greater than 50% respiratory variability, suggesting right atrial pressure of 3 mmHg. IAS/Shunts: The interatrial septum was not well visualized.  LEFT VENTRICLE PLAX 2D LVIDd:         4.50 cm  Diastology LVIDs:         3.00 cm  LV e' lateral:   8.38 cm/s LV PW:         1.00 cm  LV E/e' lateral: 9.2 LV IVS:        1.00 cm  LV e' medial:    6.20 cm/s LVOT diam:     2.00 cm  LV E/e' medial:  12.4 LV SV:         57 LV SV Index:   32 LVOT Area:     3.14 cm  RIGHT VENTRICLE TAPSE (M-mode): 2.0 cm LEFT ATRIUM             Index       RIGHT ATRIUM           Index LA diam:        4.20 cm 2.36 cm/m  RA Area:     12.50 cm LA Vol (A2C):   64.2 ml 36.11 ml/m RA Volume:   23.20 ml  13.05 ml/m LA Vol (A4C):   33.2 ml 18.67 ml/m LA Biplane Vol: 51.1 ml 28.74 ml/m  AORTIC VALVE LVOT Vmax:   89.70 cm/s LVOT Vmean:  62.300 cm/s LVOT VTI:    0.183 m  AORTA Ao Root diam: 3.30 cm MITRAL VALVE MV Area (PHT): 2.07 cm     SHUNTS MV Decel Time: 366 msec     Systemic VTI:  0.18 m MV E velocity: 77.10 cm/s   Systemic Diam: 2.00 cm MV A velocity: 112.00 cm/s MV E/A ratio:  0.69 Oswaldo Milian MD Electronically signed by Oswaldo Milian MD Signature Date/Time:  08/12/2019/8:03:37 PM    Final    ECHO INTRAOPERATIVE TEE  Result Date: 08/17/2019  *INTRAOPERATIVE TRANSESOPHAGEAL REPORT *  Patient Name:   UYEN EICHHOLZ Date of Exam: 08/17/2019 Medical Rec #:  [id number]         Height:       61.5 in Accession #:    [id number]        Weight:       161.1 lb Date of Birth:  01/20/1941          BSA:          1.73 m Patient Age:    25 years          BP:           171/66 mmHg Patient Gender: F                 HR:           59 bpm. Exam Location:  Anesthesiology Transesophogeal exam was perform intraoperatively during surgical procedure. Patient was closely monitored under general anesthesia during the entirety of examination. Indications:     CAD, CABG Sonographer:     Dustin Flock RDCS Performing Phys: [id number] Lucile Crater LIGHTFOOT Diagnosing Phys: Annye Asa MD Complications: No known complications during this procedure. POST-OP IMPRESSIONS Limited Post- CPB exam: - Left Ventricle: The left ventricular function is unchanged from pre-bypass images. Contractility remains normal, without regional motion abnormalities. Overall EF 50-60%. - Aortic Valve: The aortic valve appears unchanged from pre-bypass. - Mitral Valve: The mitral valve appears unchanged from pre-bypass. There is mild MR. - Tricuspid Valve: The tricuspid valve appears unchanged from pre-bypass. PRE-OP FINDINGS  Left Ventricle: The left ventricle has hyperdynamic systolic function, with an ejection fraction of >65%, calculated 65.6%. The cavity size was normal. There is no increase in left ventricular wall thickness. No evidence of left ventricular regional wall motion abnormalities. Right Ventricle: The right ventricle has normal systolic function. The cavity was normal. There is no increase in right ventricular wall thickness. Right ventricular systolic pressure is normal. Left Atrium: Left atrial size was normal in size. The left atrial appendage is well visualized and there is no evidence of thrombus present. Left atrial appendage velocity is normal at greater than 40 cm/s. Right Atrium: Right atrial size was normal in size. Interatrial Septum: No atrial level shunt detected by color flow Doppler. Pericardium: There is no evidence of pericardial effusion. Mitral Valve: The mitral valve is normal in structure. Mild thickening of the mitral valve leaflet. Mild calcification of the mitral valve leaflet. Mitral valve regurgitation is trivial by color flow Doppler. There is no evidence of mitral valve vegetation. Pulmonary venous flow is normal. There is no evidence of mitral stenosis, with peak gradient 5 mmHg, mean gradient 1 mmHg. Tricuspid Valve: The tricuspid valve was normal in structure. Tricuspid valve regurgitation is trivial by color flow Doppler. There  is no evidence of tricuspid valve vegetation. Aortic Valve: The aortic valve is tricuspid. Aortic valve regurgitation was not visualized by color flow Doppler. There is no evidence of aortic valve stenosis, with peak gradient 8 mmHg. There is no evidence of a vegetation on the aortic valve. Pulmonic Valve: The pulmonic valve was normal in structure, with normal leaflet excursion. No evidence of pulmonic stenosis. Pulmonic valve regurgitation is trivial by color flow Doppler. Aorta: The aortic root, ascending aorta and aortic arch are normal in size and structure. Very little atherosclerosis is visualized. Pulmonary Artery: The pulmonary artery is  of normal size. Venous: The inferior vena cava was not well visualized, but appears normal in size. +-------------+--------++ MITRAL VALVE          +-------------+--------++ MV Mean grad:1.0 mmHg +-------------+--------++  Annye Asa MD Electronically signed by Annye Asa MD Signature Date/Time: 08/17/2019/5:47:48 PM    Final    Doppler Pre CABG  Result Date: 08/17/2019 PREOPERATIVE VASCULAR EVALUATION  Risk Factors:     Hypertension, hyperlipidemia, coronary artery disease, prior                   CVA. Comparison Study: Prior carotid study from 02/28/18 is available for comparison.                   No significant changes noted. Performing Technologist: Sharion Dove RVS  Examination Guidelines: A complete evaluation includes B-mode imaging, spectral Doppler, color Doppler, and power Doppler as needed of all accessible portions of each vessel. Bilateral testing is considered an integral part of a complete examination. Limited examinations for reoccurring indications may be performed as noted.  Right Carotid Findings: +----------+--------+--------+--------+------------+--------+           PSV cm/sEDV cm/sStenosisDescribe    Comments +----------+--------+--------+--------+------------+--------+ CCA Prox  92      14                                    +----------+--------+--------+--------+------------+--------+ CCA Distal81      15                                   +----------+--------+--------+--------+------------+--------+ ICA Prox  105     23              heterogenous         +----------+--------+--------+--------+------------+--------+ ICA Distal96      19                                   +----------+--------+--------+--------+------------+--------+ ECA       112     11                                   +----------+--------+--------+--------+------------+--------+ Portions of this table do not appear on this page. +----------+--------+-------+--------+------------+           PSV cm/sEDV cmsDescribeArm Pressure +----------+--------+-------+--------+------------+ Subclavian103                                 +----------+--------+-------+--------+------------+ +---------+--------+--+--------+--+ VertebralPSV cm/s44EDV cm/s10 +---------+--------+--+--------+--+ Left Carotid Findings: +----------+--------+--------+--------+------------+--------+           PSV cm/sEDV cm/sStenosisDescribe    Comments +----------+--------+--------+--------+------------+--------+ CCA Prox  111     18                                   +----------+--------+--------+--------+------------+--------+ CCA Distal91      20                                   +----------+--------+--------+--------+------------+--------+ ICA Prox  90  17              heterogenous         +----------+--------+--------+--------+------------+--------+ ECA       217     0                                    +----------+--------+--------+--------+------------+--------+ +----------+--------+--------+--------+------------+ SubclavianPSV cm/sEDV cm/sDescribeArm Pressure +----------+--------+--------+--------+------------+           123                                   +----------+--------+--------+--------+------------+ +---------+--------+--+--------+--+ VertebralPSV cm/s53EDV cm/s14 +---------+--------+--+--------+--+  ABI Findings: +--------+------------------+-----+---------+--------+ Right   Rt Pressure (mmHg)IndexWaveform Comment  +--------+------------------+-----+---------+--------+ [id number]                    triphasic         +--------+------------------+-----+---------+--------+ ATA                            triphasic         +--------+------------------+-----+---------+--------+ PTA                            triphasic         +--------+------------------+-----+---------+--------+ +--------+------------------+-----+---------+-------+ Left    Lt Pressure (mmHg)IndexWaveform Comment +--------+------------------+-----+---------+-------+ Brachial                       triphasic        +--------+------------------+-----+---------+-------+ ATA                            triphasic        +--------+------------------+-----+---------+-------+ PTA                            triphasic        +--------+------------------+-----+---------+-------+  Right Doppler Findings: +--------+--------+-----+---------+--------+ Site    PressureIndexDoppler  Comments +--------+--------+-----+---------+--------+ [id number]          triphasic         +--------+--------+-----+---------+--------+ Radial               triphasic         +--------+--------+-----+---------+--------+ Ulnar                triphasic         +--------+--------+-----+---------+--------+  Left Doppler Findings: +-----------+--------+-----+---------+-----------------------------------------+ Site       PressureIndexDoppler  Comments                                  +-----------+--------+-----+---------+-----------------------------------------+ Brachial                triphasic                                           +-----------+--------+-----+---------+-----------------------------------------+ Radial                  triphasic                                          +-----------+--------+-----+---------+-----------------------------------------+  Ulnar                   triphasic                                          +-----------+--------+-----+---------+-----------------------------------------+ Palmar Arch                      Doppler signal remains normal with radial                                  compression and reverses with ulnar                                        compression                               +-----------+--------+-----+---------+-----------------------------------------+  Summary: Right Carotid: The extracranial vessels were near-normal with only minimal wall                thickening or plaque. Left Carotid: The extracranial vessels were near-normal with only minimal wall               thickening or plaque. Vertebrals:  Bilateral vertebral arteries demonstrate antegrade flow. Subclavians: Normal flow hemodynamics were seen in bilateral subclavian              arteries. Right Upper Extremity: Doppler waveforms decrease <50% with right radial compression. Doppler waveforms remain within normal limits with right ulnar compression. Left Upper Extremity: Doppler waveforms remain within normal limits with left radial compression. Doppler waveforms remain within normal limits with left ulnar compression.  Electronically signed by Deitra Mayo MD on 08/17/2019 at 8:53:34 AM.    Final     Discharge Medications: Allergies as of 08/24/2019      Reactions   Penicillins Hives   Last reaction was at 34 Has patient had a PCN reaction causing immediate rash, facial/tongue/throat swelling, SOB or lightheadedness with hypotension: Yes Has patient had a PCN reaction causing severe rash involving mucus membranes or skin necrosis: No Has patient had a PCN reaction that  required hospitalization No Has patient had a PCN reaction occurring within the last 10 years: No If all of the above answers are "NO", then may proceed with Cephalosporin use.   Brovana [arformoterol] Other (See Comments)   "makes me nervous"   Paxil [paroxetine Hcl] Rash      Medication List    STOP taking these medications   Aleve 220 MG Caps Generic drug: Naproxen Sodium   clopidogrel 75 MG tablet Commonly known as: PLAVIX   isosorbide mononitrate 30 MG 24 hr tablet Commonly known as: IMDUR   metoprolol succinate 25 MG 24 hr tablet Commonly known as: TOPROL-XL   nitroGLYCERIN 0.4 MG SL tablet Commonly known as: NITROSTAT     TAKE these medications   albuterol 108 (90 Base) MCG/ACT inhaler Commonly known as: VENTOLIN HFA Inhale 2 puffs into the lungs every 6 (six) hours as needed for wheezing or shortness of breath.   amLODipine 10 MG tablet Commonly known as: NORVASC Take 1 tablet (10 mg total) by mouth daily. What changed:   medication strength  how much to take   aspirin 325 MG EC tablet Take 1 tablet (325 mg total) by mouth daily.   CALCIUM 600 + D PO Take 2 tablets by mouth daily after breakfast.   folic acid 161 MCG tablet Commonly known as: FOLVITE Take 400 mcg by mouth daily after breakfast.   furosemide 40 MG tablet Commonly known as: LASIX Take 1 tablet (40 mg total) by mouth daily. For 5 days then stop.   Icy Hot 7.5 % (Roll) Misc Generic drug: Menthol (Topical Analgesic) Apply 1 each topically daily as needed (arthritis pain).   IRON PO Take 65 mg by mouth every Monday, Wednesday, and Friday.   metoprolol tartrate 50 MG tablet Commonly known as: LOPRESSOR Take 1 tablet (50 mg total) by mouth 2 (two) times daily.   OCUVITE PO Take 1 tablet by mouth daily after breakfast.   omeprazole 20 MG capsule Commonly known as: PriLOSEC Take 1 capsule (20 mg total) by mouth daily.   potassium chloride SA 20 MEQ tablet Commonly known as:  KLOR-CON Take 1 tablet (20 mEq total) by mouth daily. For 5 days then stop.   predniSONE 5 MG tablet Commonly known as: DELTASONE Take 1 tablet (5 mg total) by mouth daily after breakfast.   rosuvastatin 20 MG tablet Commonly known as: Crestor Take 1 tablet (20 mg total) by mouth daily.   traMADol 50 MG tablet Commonly known as: ULTRAM Take 1 tablet (50 mg total) by mouth every 6 (six) hours as needed for moderate pain.   vitamin B-12 500 MCG tablet Commonly known as: CYANOCOBALAMIN Take 500 mcg by mouth daily after breakfast. Vitamin B12      The patient has been discharged on:   1.Beta Blocker:  Yes [ X  ]                              No   [   ]                              If No, reason:  2.Ace Inhibitor/ARB: Yes [   ]                                     No  [  X  ]                                     If No, reason:elevated creatinine  3.Statin:   Yes Valu.Nieves   ]                  No  [   ]                  If No, reason:  4.Ecasa:  Yes  Valu.Nieves   ]                  No   [   ]                  If No, reason:  Follow Up Appointments:  Follow-up Information    [name], MD. Go on 08/27/2019.   Specialty: Cardiothoracic Surgery Why: Your appointment is at 9:45am.  Contact information: [address] [place] [address] [phone]        Richardson Dopp T, PA-C. Go on 09/06/2019.   Specialties: Cardiology, Physician Assistant Why: Appointment time is at 8:45 am  Contact information: [address] [place] [address] [phone]               Signed: Sharalyn Ink [facility] 08/24/2019, 7:36 AM

## 2019-08-17 NOTE — Anesthesia Postprocedure Evaluation (Signed)
Anesthesia Post Note  Patient: Nicole Meyer  Procedure(s) Performed: CORONARY ARTERY BYPASS GRAFTING (CABG) x 4 using LIMA to LAD(d) and endscopic right greater saphenous vein harvest: SVG to Diag1; SVG to OM1; SVG to PDA. Flowtrack: Only (N/A Chest) TRANSESOPHAGEAL ECHOCARDIOGRAM (TEE) (N/A ) ENDOVEIN HARVEST OF GREATER SAPHENOUS VEIN (Right Leg Upper)     Patient location during evaluation: SICU Anesthesia Type: General Level of consciousness: responds to stimulation, patient remains intubated per anesthesia plan and sedated Pain management: pain level controlled Vital Signs Assessment: post-procedure vital signs reviewed and stable Respiratory status: respiratory function stable, patient remains intubated per anesthesia plan and patient on ventilator - see flowsheet for VS (weaning vent now) Cardiovascular status: stable Postop Assessment: no apparent nausea or vomiting Anesthetic complications: no   No complications documented.  Last Vitals:  Vitals:   08/17/19 1600 08/17/19 1700  BP: 115/61 (!) 105/57  Pulse: (!) 58 (!) 57  Resp: 15 16  Temp: (!) 32.8 C 36.6 C  SpO2: 99% 98%    Last Pain:  Vitals:   08/17/19 1600  TempSrc: Bladder  PainSc:                  Elfrida Pixley,E. Coulton Schlink

## 2019-08-17 NOTE — Procedures (Signed)
Extubation Procedure Note  Patient Details:   Name: Nicole Meyer DOB: 09/24/1940 MRN: [id number]   Airway Documentation:    Vent end date: 08/17/19 Vent end time: 1755   Evaluation  O2 sats: stable throughout Complications: No apparent complications Patient did tolerate procedure well. Bilateral Breath Sounds: Clear, Diminished   Yes  NIF-22 FVC- 562ml Placed on 4l/min Taylor Poor effort on Incentive spirometer, continue to monitor.  Revonda Standard 08/17/2019, 6:02 PM

## 2019-08-17 NOTE — Transfer of Care (Signed)
Immediate Anesthesia Transfer of Care Note  Patient: Nicole Meyer  Procedure(s) Performed: CORONARY ARTERY BYPASS GRAFTING (CABG) x 4 using LIMA to LAD(d) and endscopic right greater saphenous vein harvest: SVG to Diag1; SVG to OM1; SVG to PDA. Flowtrack: Only (N/A Chest) TRANSESOPHAGEAL ECHOCARDIOGRAM (TEE) (N/A ) ENDOVEIN HARVEST OF GREATER SAPHENOUS VEIN (Right Leg Upper)  Patient Location: ICU  Anesthesia Type:General  Level of Consciousness: Patient remains intubated per anesthesia plan  Airway & Oxygen Therapy: Patient remains intubated per anesthesia plan and Patient placed on Ventilator (see vital sign flow sheet for setting)  Post-op Assessment: Report given to RN and Post -op Vital signs reviewed and stable  Post vital signs: Reviewed and stable  Last Vitals:  Vitals Value Taken Time  BP    Temp    Pulse    Resp    SpO2      Last Pain:  Vitals:   08/17/19 0433  TempSrc: Oral  PainSc:       Patients Stated Pain Goal: 0 (57/26/20 3559)  Complications: No complications documented.

## 2019-08-18 ENCOUNTER — Encounter (HOSPITAL_COMMUNITY): Payer: Self-pay | Admitting: Thoracic Surgery (Cardiothoracic Vascular Surgery)

## 2019-08-18 ENCOUNTER — Inpatient Hospital Stay (HOSPITAL_COMMUNITY): Payer: Medicare PPO

## 2019-08-18 LAB — BASIC METABOLIC PANEL
Anion gap: 8 (ref 5–15)
Anion gap: 6 (ref 5–15)
BUN: 17 mg/dL (ref 8–23)
BUN: 20 mg/dL (ref 8–23)
CO2: 21 mmol/L — ABNORMAL LOW (ref 22–32)
CO2: 24 mmol/L (ref 22–32)
Calcium: 8.2 mg/dL — ABNORMAL LOW (ref 8.9–10.3)
Calcium: 8.4 mg/dL — ABNORMAL LOW (ref 8.9–10.3)
Chloride: 107 mmol/L (ref 98–111)
Chloride: 107 mmol/L (ref 98–111)
Creatinine, Ser: 1.04 mg/dL — ABNORMAL HIGH (ref 0.44–1.00)
Creatinine, Ser: 1.37 mg/dL — ABNORMAL HIGH (ref 0.44–1.00)
GFR calc Af Amer: 59 mL/min — ABNORMAL LOW (ref >60)
GFR calc Af Amer: 42 mL/min — ABNORMAL LOW (ref >60)
GFR calc non Af Amer: 51 mL/min — ABNORMAL LOW (ref >60)
GFR calc non Af Amer: 37 mL/min — ABNORMAL LOW (ref >60)
Glucose, Bld: 152 mg/dL — ABNORMAL HIGH (ref 70–99)
Glucose, Bld: 121 mg/dL — ABNORMAL HIGH (ref 70–99)
Potassium: 4.3 mmol/L (ref 3.5–5.1)
Potassium: 5 mmol/L (ref 3.5–5.1)
Sodium: 136 mmol/L (ref 135–145)
Sodium: 137 mmol/L (ref 135–145)

## 2019-08-18 LAB — CBC
HCT: 34.5 % — ABNORMAL LOW (ref 36.0–46.0)
HCT: 36 % (ref 36.0–46.0)
Hemoglobin: 11.5 g/dL — ABNORMAL LOW (ref 12.0–15.0)
Hemoglobin: 11.7 g/dL — ABNORMAL LOW (ref 12.0–15.0)
MCH: 31 pg (ref 26.0–34.0)
MCH: 30.8 pg (ref 26.0–34.0)
MCHC: 33.3 g/dL (ref 30.0–36.0)
MCHC: 32.5 g/dL (ref 30.0–36.0)
MCV: 93 fL (ref 80.0–100.0)
MCV: 94.7 fL (ref 80.0–100.0)
Platelets: 89 10*3/uL — ABNORMAL LOW (ref 150–400)
Platelets: 94 10*3/uL — ABNORMAL LOW (ref 150–400)
RBC: 3.71 MIL/uL — ABNORMAL LOW (ref 3.87–5.11)
RBC: 3.8 MIL/uL — ABNORMAL LOW (ref 3.87–5.11)
RDW: 13.4 % (ref 11.5–15.5)
RDW: 13.6 % (ref 11.5–15.5)
WBC: 8.1 10*3/uL (ref 4.0–10.5)
WBC: 9.5 10*3/uL (ref 4.0–10.5)
nRBC: 0 % (ref 0.0–0.2)
nRBC: 0 % (ref 0.0–0.2)

## 2019-08-18 LAB — GLUCOSE, CAPILLARY
Glucose-Capillary: 118 mg/dL — ABNORMAL HIGH (ref 70–99)
Glucose-Capillary: 120 mg/dL — ABNORMAL HIGH (ref 70–99)
Glucose-Capillary: 122 mg/dL — ABNORMAL HIGH (ref 70–99)
Glucose-Capillary: 128 mg/dL — ABNORMAL HIGH (ref 70–99)
Glucose-Capillary: 129 mg/dL — ABNORMAL HIGH (ref 70–99)
Glucose-Capillary: 134 mg/dL — ABNORMAL HIGH (ref 70–99)
Glucose-Capillary: 159 mg/dL — ABNORMAL HIGH (ref 70–99)
Glucose-Capillary: 168 mg/dL — ABNORMAL HIGH (ref 70–99)

## 2019-08-18 LAB — MAGNESIUM
Magnesium: 2.5 mg/dL — ABNORMAL HIGH (ref 1.7–2.4)
Magnesium: 2.5 mg/dL — ABNORMAL HIGH (ref 1.7–2.4)

## 2019-08-18 MED ORDER — METOPROLOL TARTRATE 25 MG PO TABS
25.0000 mg | ORAL_TABLET | Freq: Two times a day (BID) | ORAL | Status: DC
  Administered 2019-08-18 – 2019-08-20 (×6): 25 mg via ORAL
  Filled 2019-08-18 (×6): qty 1

## 2019-08-18 MED ORDER — SODIUM CHLORIDE 0.9% FLUSH: 3.0000 mL | INTRAVENOUS | Status: DC | PRN

## 2019-08-18 MED ORDER — SODIUM CHLORIDE 0.9 % IV SOLN: 250.0000 mL | INTRAVENOUS | Status: DC | PRN

## 2019-08-18 MED ORDER — ~~LOC~~ CARDIAC SURGERY, PATIENT & FAMILY EDUCATION: Freq: Once | Status: AC

## 2019-08-18 MED ORDER — ENOXAPARIN SODIUM 40 MG/0.4ML ~~LOC~~ SOLN
40.0000 mg | Freq: Every day | SUBCUTANEOUS | Status: DC
  Administered 2019-08-18 – 2019-08-23 (×6): 40 mg via SUBCUTANEOUS
  Filled 2019-08-18 (×6): qty 0.4

## 2019-08-18 MED ORDER — SODIUM CHLORIDE 0.9% FLUSH
3.0000 mL | Freq: Two times a day (BID) | INTRAVENOUS | Status: DC
  Administered 2019-08-18 – 2019-08-23 (×10): 3 mL via INTRAVENOUS

## 2019-08-18 MED ORDER — INSULIN ASPART 100 UNIT/ML ~~LOC~~ SOLN
0.0000 [IU] | SUBCUTANEOUS | Status: DC
  Administered 2019-08-18 (×2): 2 [IU] via SUBCUTANEOUS
  Administered 2019-08-18: 4 [IU] via SUBCUTANEOUS

## 2019-08-18 MED ORDER — HYDRALAZINE HCL 20 MG/ML IJ SOLN
10.0000 mg | Freq: Four times a day (QID) | INTRAMUSCULAR | Status: DC | PRN
  Administered 2019-08-19 – 2019-08-21 (×2): 10 mg via INTRAVENOUS
  Filled 2019-08-18 (×3): qty 1

## 2019-08-18 MED FILL — Potassium Chloride Inj 2 mEq/ML: INTRAVENOUS | Qty: 40 | Status: AC

## 2019-08-18 MED FILL — Heparin Sodium (Porcine) Inj 1000 Unit/ML: INTRAMUSCULAR | Qty: 30 | Status: AC

## 2019-08-18 MED FILL — Lidocaine HCl Local Preservative Free (PF) Inj 2%: INTRAMUSCULAR | Qty: 15 | Status: AC

## 2019-08-18 NOTE — Progress Notes (Addendum)
Report called to receiving RN [phone]. Patient s/p Whitacre, CTX2 and art line removal today and tolerated all well. Patient is still waiting to void s/p Buxbaum removal at 12 and receiving RN aware. Patient with no complaints at the current time.

## 2019-08-18 NOTE — Progress Notes (Signed)
      [address]       [place] [address]             [phone]                 1 Day Post-Op Procedure(s) (LRB): CORONARY ARTERY BYPASS GRAFTING (CABG) x 4 using LIMA to LAD(d) and endscopic right greater saphenous vein harvest: SVG to Diag1; SVG to OM1; SVG to PDA. Flowtrack: Only (N/A) TRANSESOPHAGEAL ECHOCARDIOGRAM (TEE) (N/A) ENDOVEIN HARVEST OF GREATER SAPHENOUS VEIN (Right)   Events: No events. Extubated yesterday afternoon _______________________________________________________________ Vitals: BP 131/67   Pulse (!) 55   Temp 98.6 F (37 C) (Oral)   Resp 18   Ht 5' 1.5" (1.562 m)   Wt 79 kg   SpO2 98%   BMI 32.37 kg/m   - Neuro: Alert NAD  - Cardiovascular: Sinus bradycardia  Drips: None.   CVP:  [2 mmHg-16 mmHg] 10 mmHg CO:  [4.4 L/min] 4.4 L/min  - Pulm: Easy work of breathing  ABG    Component Value Date/Time   PHART 7.365 08/17/2019 1912   PCO2ART 36.4 08/17/2019 1912   PO2ART 83 08/17/2019 1912   HCO3 21.0 08/17/2019 1912   TCO2 22 08/17/2019 1912   ACIDBASEDEF 4.0 (H) 08/17/2019 1912   O2SAT 96.0 08/17/2019 1912    - Abd: Soft - Extremity: Warm  .Intake/Output      06/29 0701 - 06/30 0700 06/30 0701 - 07/01 0700   P.O.  360   I.V. (mL/kg) 3723.3 (47.1) 70.3 (0.9)   Blood 705    IV Piggyback 880.7 500   Total Intake(mL/kg) 5308.9 (67.2) 930.3 (11.8)   Urine (mL/kg/hr) 2225 (1.2) 350 (0.6)   Blood 1511    Chest Tube 240 25   Total Output 3976 375   Net +1332.9 +555.3           _______________________________________________________________ Labs: CBC Latest Ref Rng & Units 08/18/2019 08/17/2019 08/17/2019  WBC 4.0 - 10.5 K/uL 8.1 - 9.6  Hemoglobin 12.0 - 15.0 g/dL 11.5(L) 10.5(L) 12.0  Hematocrit 36 - 46 % 34.5(L) 31.0(L) 35.6(L)  Platelets 150 - 400 K/uL 89(L) - 89(L)   CMP Latest Ref Rng & Units 08/18/2019 08/17/2019 08/17/2019  Glucose 70 - 99 mg/dL 152(H) 164(H) -  BUN 8 - 23 mg/dL 17 18 -  Creatinine 0.44 -  1.00 mg/dL 1.04(H) 1.02(H) -  Sodium 135 - 145 mmol/L 136 139 141  Potassium 3.5 - 5.1 mmol/L 4.3 4.3 4.0  Chloride 98 - 111 mmol/L 107 108 -  CO2 22 - 32 mmol/L 21(L) 21(L) -  Calcium 8.9 - 10.3 mg/dL 8.2(L) 8.3(L) -  Total Protein 6.5 - 8.1 g/dL - - -  Total Bilirubin 0.3 - 1.2 mg/dL - - -  Alkaline Phos 38 - 126 U/L - - -  AST 15 - 41 U/L - - -  ALT 0 - 44 U/L - - -    CXR: Clear  _______________________________________________________________  Assessment and Plan: POD 1 s/p CABG  Neuro: Pain controlled CV: On aspirin, statin, beta-blocker.  We will remove lines. Pulm: Continue pulmonary toilet.  We will remove chest tubes Renal: Good urine output.  Creatinine stable will start diuresis tomorrow GI: Advancing diet Heme: Stable ID: Afebrile Endo: Sliding scale insulin Dispo: We will transfer to floor  Melodie Bouillon, MD 08/18/2019 2:06 PM

## 2019-08-18 NOTE — Addendum Note (Signed)
Addendum  created 08/18/19 1059 by Josephine Igo, CRNA   Order list changed

## 2019-08-19 ENCOUNTER — Inpatient Hospital Stay (HOSPITAL_COMMUNITY): Payer: Medicare PPO

## 2019-08-19 LAB — GLUCOSE, CAPILLARY
Glucose-Capillary: 100 mg/dL — ABNORMAL HIGH (ref 70–99)
Glucose-Capillary: 107 mg/dL — ABNORMAL HIGH (ref 70–99)
Glucose-Capillary: 111 mg/dL — ABNORMAL HIGH (ref 70–99)
Glucose-Capillary: 112 mg/dL — ABNORMAL HIGH (ref 70–99)
Glucose-Capillary: 113 mg/dL — ABNORMAL HIGH (ref 70–99)
Glucose-Capillary: 116 mg/dL — ABNORMAL HIGH (ref 70–99)

## 2019-08-19 LAB — CBC
HCT: 36.8 % (ref 36.0–46.0)
Hemoglobin: 12 g/dL (ref 12.0–15.0)
MCH: 30.8 pg (ref 26.0–34.0)
MCHC: 32.6 g/dL (ref 30.0–36.0)
MCV: 94.6 fL (ref 80.0–100.0)
Platelets: 86 10*3/uL — ABNORMAL LOW (ref 150–400)
RBC: 3.89 MIL/uL (ref 3.87–5.11)
RDW: 13.6 % (ref 11.5–15.5)
WBC: 9.3 10*3/uL (ref 4.0–10.5)
nRBC: 0 % (ref 0.0–0.2)

## 2019-08-19 LAB — BASIC METABOLIC PANEL
Anion gap: 8 (ref 5–15)
BUN: 21 mg/dL (ref 8–23)
CO2: 21 mmol/L — ABNORMAL LOW (ref 22–32)
Calcium: 8.4 mg/dL — ABNORMAL LOW (ref 8.9–10.3)
Chloride: 107 mmol/L (ref 98–111)
Creatinine, Ser: 1.21 mg/dL — ABNORMAL HIGH (ref 0.44–1.00)
GFR calc Af Amer: 49 mL/min — ABNORMAL LOW (ref >60)
GFR calc non Af Amer: 43 mL/min — ABNORMAL LOW (ref >60)
Glucose, Bld: 113 mg/dL — ABNORMAL HIGH (ref 70–99)
Potassium: 4.3 mmol/L (ref 3.5–5.1)
Sodium: 136 mmol/L (ref 135–145)

## 2019-08-19 MED ORDER — FUROSEMIDE 40 MG PO TABS
40.0000 mg | ORAL_TABLET | Freq: Every day | ORAL | Status: DC
  Administered 2019-08-19 – 2019-08-20 (×2): 40 mg via ORAL
  Filled 2019-08-19 (×2): qty 1

## 2019-08-19 MED ORDER — AMLODIPINE BESYLATE 5 MG PO TABS
5.0000 mg | ORAL_TABLET | Freq: Every day | ORAL | Status: DC
  Administered 2019-08-19 – 2019-08-20 (×2): 5 mg via ORAL
  Filled 2019-08-19 (×2): qty 1

## 2019-08-19 MED ORDER — POTASSIUM CHLORIDE CRYS ER 20 MEQ PO TBCR
40.0000 meq | EXTENDED_RELEASE_TABLET | Freq: Once | ORAL | Status: AC
  Administered 2019-08-19: 40 meq via ORAL
  Filled 2019-08-19: qty 2

## 2019-08-19 MED ORDER — INSULIN ASPART 100 UNIT/ML ~~LOC~~ SOLN
0.0000 [IU] | Freq: Three times a day (TID) | SUBCUTANEOUS | Status: DC
  Administered 2019-08-20 (×2): 2 [IU] via SUBCUTANEOUS
  Administered 2019-08-21 – 2019-08-22 (×2): 4 [IU] via SUBCUTANEOUS
  Administered 2019-08-22 – 2019-08-24 (×5): 2 [IU] via SUBCUTANEOUS

## 2019-08-19 NOTE — Evaluation (Signed)
Physical Therapy Evaluation Patient Details Name: Nicole Meyer MRN: [id number] DOB: 03/18/1940 Today's Date: 08/19/2019   History of Present Illness  Pt s/p CABG on 6/29. PMH - CVA, Rt TKR, anxiety, depression, arthritis, copd, htn, ckd, polymyalgia  Clinical Impression  Pt presents to PT with decr mobility after surgery and decr activity. Did well with sternal precautions. Expect she will make steady progress and be able to return home with husband. If she doesn't progress may need to look at other post acute options. Pt is anxious and this has been documented in prior visits with PT in prior hospitalizations.     Follow Up Recommendations Home health PT;Supervision for mobility/OOB    Equipment Recommendations  None recommended by PT    Recommendations for Other Services       Precautions / Restrictions Precautions Precautions: Sternal;Fall Precaution Comments: followed precautions with sit to stand and amb      Mobility  Bed Mobility               General bed mobility comments: Pt up in chair  Transfers Overall transfer level: Needs assistance Equipment used: 4-wheeled walker Transfers: Sit to/from Stand Sit to Stand: Min assist         General transfer comment: Assist for stability with rise. Pt used heart pillow or hands to knees for sternal precautions  Ambulation/Gait Ambulation/Gait assistance: Min assist Gait Distance (Feet): 50 Feet (50' x 2) Assistive device: 4-wheeled walker Gait Pattern/deviations: Step-through pattern;Decreased stride length Gait velocity: decr Gait velocity interpretation: <1.31 ft/sec, indicative of household ambulator General Gait Details: Assist for stability.   Stairs            Wheelchair Mobility    Modified Rankin (Stroke Patients Only)       Balance Overall balance assessment: Needs assistance Sitting-balance support: No upper extremity supported;Feet supported Sitting balance-Leahy Scale: Fair      Standing balance support: No upper extremity supported Standing balance-Leahy Scale: Fair                               Pertinent Vitals/Pain Pain Assessment: Faces Faces Pain Scale: Hurts little more Pain Location: chest with coughing Pain Descriptors / Indicators: Grimacing Pain Intervention(s): Monitored during session;Other (comment) (splinted with heart pillow)    Home Living Family/patient expects to be discharged to:: Private residence Living Arrangements: Spouse/significant other Available Help at Discharge: Family;Available 24 hours/day Type of Home: House Home Access: Ramped entrance     Home Layout: One level Home Equipment: Walker - 4 wheels;Cane - single point;Tub bench      Prior Function Level of Independence: Independent               Hand Dominance   Dominant Hand: Right    Extremity/Trunk Assessment   Upper Extremity Assessment Upper Extremity Assessment: Defer to OT evaluation    Lower Extremity Assessment Lower Extremity Assessment: Generalized weakness       Communication   Communication: No difficulties  Cognition Arousal/Alertness: Awake/alert Behavior During Therapy: Anxious Overall Cognitive Status: Within Functional Limits for tasks assessed                                        General Comments General comments (skin integrity, edema, etc.): VSS on RA    Exercises     Assessment/Plan  PT Assessment Patient needs continued PT services  PT Problem List Decreased strength;Decreased activity tolerance;Decreased balance;Decreased mobility;Decreased knowledge of precautions       PT Treatment Interventions DME instruction;Gait training;Functional mobility training;Therapeutic activities;Therapeutic exercise;Balance training;Patient/family education    PT Goals (Current goals can be found in the Care Plan section)  Acute Rehab PT Goals Patient Stated Goal: go home PT Goal Formulation: With  patient Time For Goal Achievement: 09/02/19 Potential to Achieve Goals: Good    Frequency Min 3X/week   Barriers to discharge        Co-evaluation               AM-PAC PT "6 Clicks" Mobility  Outcome Measure Help needed turning from your back to your side while in a flat bed without using bedrails?: A Little Help needed moving from lying on your back to sitting on the side of a flat bed without using bedrails?: A Little Help needed moving to and from a bed to a chair (including a wheelchair)?: A Little Help needed standing up from a chair using your arms (e.g., wheelchair or bedside chair)?: A Little Help needed to walk in hospital room?: A Little Help needed climbing 3-5 steps with a railing? : A Lot 6 Click Score: 17    End of Session Equipment Utilized During Treatment: Gait belt Activity Tolerance: Patient limited by fatigue Patient left: in chair;with call bell/phone within reach Nurse Communication: Mobility status PT Visit Diagnosis: Unsteadiness on feet (R26.81);Other abnormalities of gait and mobility (R26.89);Muscle weakness (generalized) (M62.81)    Time: 1137-1202 PT Time Calculation (min) (ACUTE ONLY): 25 min   Charges:   PT Evaluation $PT Eval Moderate Complexity: 1 Mod PT Treatments $Gait Training: 8-22 mins        [place] Pager [phone] Office [place] 08/19/2019, 5:05 PM

## 2019-08-19 NOTE — Progress Notes (Signed)
CARDIAC REHAB PHASE I   PRE:  Rate/Rhythm: 71 SR    BP: sitting 173/100    SaO2: 100 2L, 96 RA  MODE:  Ambulation: to sink then recliner   POST:  Rate/Rhythm: 76 SR    BP: sitting 152/82     SaO2: 96 RA  Pt moved to EOB well and stood with min assist. However after a few feet pt suddenly c/o her "legs giving out". Became anxious and struggled to sit when we gave her a chair. Pt required physical assist to guide to chair. Encouraged pt to slow breathing. Sts her legs hurt. After rest, pt able to stand and walk to recliner on other side of bed. Taking smaller steps with right leg. Generally anxious, slow moving. VSS and we were able to d/c O2 in recliner. Encouraged more walking and IS.  Has PT later. [place], ACSM 08/19/2019 9:55 AM

## 2019-08-19 NOTE — Progress Notes (Signed)
Patient's BP 173/77 HR 83. Metoprolol 2.5 mg IV given per MD order. Will continue to monitor.

## 2019-08-19 NOTE — Plan of Care (Signed)
Continue to monitor

## 2019-08-19 NOTE — Progress Notes (Signed)
     [address]       [place] [address]             [phone]       Doing well today.  Vitals:   08/19/19 0457 08/19/19 0804  BP: (!) 150/68 (!) 158/66  Pulse: 65 68  Resp: 13 16  Temp:  98.4 F (36.9 C)  SpO2: 98% 96%   Alert NAD Sinus Easy work of breathing on 2 L nasal cannula Abdomen soft  Labs reviewed  Postoperative day 2 status post CABG, doing well Amlodipine added for hypertension. Lasix ordered from continue diuresis Continue pulmonary toilet and physical therapy Dispo planning.  Zuria Fosdick [name]

## 2019-08-19 NOTE — Plan of Care (Signed)
  Problem: Education: Goal: Knowledge of General Education information will improve Description: Including pain rating scale, medication(s)/side effects and non-pharmacologic comfort measures Outcome: Not Progressing   Problem: Education: Goal: Knowledge of General Education information will improve Description: Including pain rating scale, medication(s)/side effects and non-pharmacologic comfort measures Outcome: Not Progressing   Problem: Health Behavior/Discharge Planning: Goal: Ability to manage health-related needs will improve Outcome: Not Progressing   Problem: Clinical Measurements: Goal: Ability to maintain clinical measurements within normal limits will improve Outcome: Not Progressing Goal: Will remain free from infection Outcome: Not Progressing Goal: Diagnostic test results will improve Outcome: Not Progressing Goal: Respiratory complications will improve Outcome: Not Progressing Goal: Cardiovascular complication will be avoided Outcome: Not Progressing

## 2019-08-19 NOTE — Progress Notes (Signed)
   08/19/19 2150  Vitals  BP (!) 175/88  MAP (mmHg) 112  Pulse Rate 76  ECG Heart Rate 79  Resp 16   10 mg prn hydralazine given per order

## 2019-08-20 LAB — TYPE AND SCREEN
ABO/RH(D): O POS
Antibody Screen: NEGATIVE
Unit division: 0
Unit division: 0
Unit division: 0
Unit division: 0

## 2019-08-20 LAB — BPAM RBC
Blood Product Expiration Date: 202107262359
Blood Product Expiration Date: 202107272359
Blood Product Expiration Date: 202108022359
Blood Product Expiration Date: 202108022359
ISSUE DATE / TIME: 202106290839
ISSUE DATE / TIME: 202106290839
ISSUE DATE / TIME: 202106290839
ISSUE DATE / TIME: 202106290839
Unit Type and Rh: 5100
Unit Type and Rh: 5100
Unit Type and Rh: 5100
Unit Type and Rh: 5100

## 2019-08-20 LAB — GLUCOSE, CAPILLARY
Glucose-Capillary: 101 mg/dL — ABNORMAL HIGH (ref 70–99)
Glucose-Capillary: 134 mg/dL — ABNORMAL HIGH (ref 70–99)
Glucose-Capillary: 127 mg/dL — ABNORMAL HIGH (ref 70–99)
Glucose-Capillary: 113 mg/dL — ABNORMAL HIGH (ref 70–99)

## 2019-08-20 MED ORDER — POLYETHYLENE GLYCOL 3350 17 G PO PACK
17.0000 g | PACK | Freq: Once | ORAL | Status: AC
  Administered 2019-08-20: 17 g via ORAL
  Filled 2019-08-20: qty 1

## 2019-08-20 NOTE — Progress Notes (Signed)
CARDIAC REHAB PHASE I   PRE:  Rate/Rhythm: 78 SR  BP:  Supine: 147/68  Sitting:   Standing:    SaO2: 95%RA  MODE:  Ambulation: 120 ft 60 ft x 2  POST:  Rate/Rhythm: 97 SR  BP:  Supine:   Sitting: 148/76  Standing:    SaO2: 94%RA [id number] Pt did much better this morning with walk. Pt walked 45ft on RA, gait belt, rolling walker and asst x 2 and needed to sit. C/o legs weak. Pt rested and then walked back to room to [facility]. Pt stands with little assistance with rocking. Reinforced sternal precautions. Emotional support given and encouragement. Pt gets a little anxious at times but tolerated walk well. PT to see later. To recliner with call bell.  Needed much assistance to get out of bed but once sitting on side of bed she stood well.   Graylon Good, RN BSN  08/20/2019 9:42 AM

## 2019-08-20 NOTE — Care Management Important Message (Signed)
Important Message  Patient Details  Name: Nicole Meyer MRN: [id number] Date of Birth: 1941/01/20   Medicare Important Message Given:  Yes     Shelda Altes 08/20/2019, 10:31 AM

## 2019-08-20 NOTE — Progress Notes (Signed)
Mobility Specialist: Progress Note    08/20/19 1621  Mobility  Activity Ambulated in room  Level of Assistance Minimal assist, patient does 75% or more  Assistive Device Front wheel walker  Distance Ambulated (ft) 30 ft  Mobility Response Tolerated fair  Mobility performed by Mobility specialist  $Mobility charge 1 Mobility   Pre-Mobility: 80 HR, 163/80 BP, 94% SpO2 Post-Mobility: 88 HR, 160/85BP, 94% SpO2  Pt ambulated to BR. Upon exiting BR pt needed to take a seated rest break lasting a minute before going back to bed.   [facility] Henleigh Robello Mobility Specialist

## 2019-08-20 NOTE — Progress Notes (Signed)
Physical Therapy Treatment Patient Details Name: Nicole Meyer MRN: [id number] DOB: 1941/01/30 Today's Date: 08/20/2019    History of Present Illness Pt s/p CABG on 6/29. PMH - CVA, Rt TKR, anxiety, depression, arthritis, copd, htn, ckd, polymyalgia    PT Comments    Patient progressing well towards PT goals. Requires Min A for transfers and Min guard for ambulation with use of rollator. Continues to demonstrate weakness in BLEs and fatigue limiting distance. Also noted to have anxiety relating to mobility. Needs cues to adhere to sternal precautions during mobility. Pt concerned about going home as she does not have any assist from her spouse so discharge recommendation updated to SNF to maximize independence and mobility prior to return home. Will follow.   Follow Up Recommendations  Supervision for mobility/OOB;SNF     Equipment Recommendations  None recommended by PT    Recommendations for Other Services       Precautions / Restrictions Precautions Precautions: Sternal;Fall Precaution Booklet Issued: No Restrictions Weight Bearing Restrictions: Yes Other Position/Activity Restrictions: sternal precautions    Mobility  Bed Mobility Overal bed mobility: Needs Assistance Bed Mobility: Sit to Supine       Sit to supine: Min guard;HOB elevated   General bed mobility comments: Able to bring LEs into bed to return to supine.  Transfers Overall transfer level: Needs assistance Equipment used: Rolling walker (2 wheeled);4-wheeled walker Transfers: Sit to/from Stand Sit to Stand: Min assist         General transfer comment: Assist for stability to rise and cues for forward momentum. Cues for hands on knees.  Ambulation/Gait Ambulation/Gait assistance: Min assist Gait Distance (Feet): 25 Feet (+80') Assistive device: 4-wheeled walker;Rolling walker (2 wheeled) Gait Pattern/deviations: Step-through pattern;Decreased stride length;Trunk flexed Gait velocity:  decr Gait velocity interpretation: <1.31 ft/sec, indicative of household ambulator General Gait Details: Slow, mostly steady gait with RW and rollator for support; anxious about LE weakness and fatigue. 1 seated rest break. VSS.   Stairs             Wheelchair Mobility    Modified Rankin (Stroke Patients Only)       Balance Overall balance assessment: Needs assistance Sitting-balance support: Feet supported;No upper extremity supported Sitting balance-Leahy Scale: Fair     Standing balance support: During functional activity Standing balance-Leahy Scale: Fair                              Cognition Arousal/Alertness: Awake/alert Behavior During Therapy: Anxious Overall Cognitive Status: Within Functional Limits for tasks assessed                                 General Comments: Needs cues to adhere to sternal precautions during mobility.      Exercises      General Comments General comments (skin integrity, edema, etc.): VSS on RA.      Pertinent Vitals/Pain Pain Assessment: No/denies pain    Home Living                      Prior Function            PT Goals (current goals can now be found in the care plan section) Progress towards PT goals: Progressing toward goals    Frequency    Min 3X/week      PT Plan Discharge plan needs to be  updated    Co-evaluation              AM-PAC PT "6 Clicks" Mobility   Outcome Measure  Help needed turning from your back to your side while in a flat bed without using bedrails?: A Little Help needed moving from lying on your back to sitting on the side of a flat bed without using bedrails?: A Little Help needed moving to and from a bed to a chair (including a wheelchair)?: A Little Help needed standing up from a chair using your arms (e.g., wheelchair or bedside chair)?: A Little Help needed to walk in hospital room?: A Little Help needed climbing 3-5 steps with a  railing? : A Lot 6 Click Score: 17    End of Session Equipment Utilized During Treatment: Gait belt Activity Tolerance: Patient limited by fatigue Patient left: in bed;with call bell/phone within reach;with bed alarm set Nurse Communication: Mobility status PT Visit Diagnosis: Unsteadiness on feet (R26.81);Other abnormalities of gait and mobility (R26.89);Muscle weakness (generalized) (M62.81)     Time: 6812-7517 PT Time Calculation (min) (ACUTE ONLY): 24 min  Charges:  $Gait Training: 8-22 mins $Therapeutic Activity: 8-22 mins                     Marisa Severin, PT, DPT Acute Rehabilitation Services Pager [phone] Office [place] 08/20/2019, 3:25 PM

## 2019-08-20 NOTE — Plan of Care (Signed)
Continue to monitor

## 2019-08-20 NOTE — Progress Notes (Addendum)
[address]       [place] [address]             [phone]      3 Days Post-Op Procedure(s) (LRB): CORONARY ARTERY BYPASS GRAFTING (CABG) x 4 using LIMA to LAD(d) and endscopic right greater saphenous vein harvest: SVG to Diag1; SVG to OM1; SVG to PDA. Flowtrack: Only (N/A) TRANSESOPHAGEAL ECHOCARDIOGRAM (TEE) (N/A) ENDOVEIN HARVEST OF GREATER SAPHENOUS VEIN (Right) Subjective: Awake and alert, complains of some right leg pain that she says is common since her stroke.  Tolerating PO's, no flatus or BM yet.   Objective: Vital signs in last 24 hours: Temp:  [97.8 F (36.6 C)-99.3 F (37.4 C)] 98.7 F (37.1 C) (07/02 0823) Pulse Rate:  [62-93] 77 (07/02 0823) Cardiac Rhythm: Normal sinus rhythm (07/02 0403) Resp:  [15-19] 16 (07/02 0823) BP: (131-177)/(65-88) 131/69 (07/02 0823) SpO2:  [93 %-100 %] 93 % (07/02 0823) Weight:  [76.8 kg] 76.8 kg (07/02 0403)    Intake/Output from previous day: 07/01 0701 - 07/02 0700 In: 120 [P.O.:120] Out: 600 [Urine:600] Intake/Output this shift: No intake/output data recorded.  General appearance: alert, cooperative and no distress Neurologic: No focal deficits Heart: SR.  Lungs: clear to auscultation bilaterally Abdomen: Soft, NT, rare bowel sounds Extremities: RLE dressings removed. Incisions are approximated with staples and are dry.  Minimal edema. Wound: Sternal dressing removed, skin edges approximated, expected bruising adjacent to incision.   Lab Results: Recent Labs    08/18/19 1625 08/19/19 0406  WBC 9.5 9.3  HGB 11.7* 12.0  HCT 36.0 36.8  PLT 94* 86*   BMET:  Recent Labs    08/18/19 1625 08/19/19 0406  NA 137 136  K 5.0 4.3  CL 107 107  CO2 24 21*  GLUCOSE 121* 113*  BUN 20 21  CREATININE 1.37* 1.21*  CALCIUM 8.4* 8.4*    PT/INR:  Recent Labs    08/17/19 1317  LABPROT 19.2*  INR 1.7*   ABG    Component Value Date/Time   PHART 7.365 08/17/2019 1912   HCO3 21.0  08/17/2019 1912   TCO2 22 08/17/2019 1912   ACIDBASEDEF 4.0 (H) 08/17/2019 1912   O2SAT 96.0 08/17/2019 1912   CBG (last 3)  Recent Labs    08/19/19 1630 08/19/19 2139 08/20/19 0605  GLUCAP 116* 100* 101*    Assessment/Plan: S/P Procedure(s) (LRB): CORONARY ARTERY BYPASS GRAFTING (CABG) x 4 using LIMA to LAD(d) and endscopic right greater saphenous vein harvest: SVG to Diag1; SVG to OM1; SVG to PDA. Flowtrack: Only (N/A) TRANSESOPHAGEAL ECHOCARDIOGRAM (TEE) (N/A) ENDOVEIN HARVEST OF GREATER SAPHENOUS VEIN (Right)  -POD3 CABG for MVCAD presenting with exertional angina, Normal EF.  Progressing well. Progressing slowly with ambulation, on RA with adequate O2 sats.  Stable cardiac rhythm. Continue ASA, statin, B. blocker.  -Hypertension- Required PRN metoprolol and hydralazine yesterday.   BP improved after starting amlodipine.  Now in 628'B systolic. No change in medications for now.   -Thrombocytopenia- Plt count 86,000 7/1. Recheck in AM.   -CKD, stage 3- Creat stable at 1.2, monitor.   -Mild volume excess- continue daily oral Lasix.   -DVT PPX- on daily enoxaparin.   -Disposition- plan eventual discharge to home with home health PT.   LOS: 8 days    Antony Odea, PA-C [phone] 08/20/2019   Doing well. Working with physical therapy Off supplemental O2 Physical therapy recommends home physical therapy [place]

## 2019-08-21 LAB — BASIC METABOLIC PANEL
Anion gap: 11 (ref 5–15)
BUN: 35 mg/dL — ABNORMAL HIGH (ref 8–23)
CO2: 22 mmol/L (ref 22–32)
Calcium: 8.6 mg/dL — ABNORMAL LOW (ref 8.9–10.3)
Chloride: 102 mmol/L (ref 98–111)
Creatinine, Ser: 1.54 mg/dL — ABNORMAL HIGH (ref 0.44–1.00)
GFR calc Af Amer: 37 mL/min — ABNORMAL LOW (ref >60)
GFR calc non Af Amer: 32 mL/min — ABNORMAL LOW (ref >60)
Glucose, Bld: 105 mg/dL — ABNORMAL HIGH (ref 70–99)
Potassium: 3.8 mmol/L (ref 3.5–5.1)
Sodium: 135 mmol/L (ref 135–145)

## 2019-08-21 LAB — CBC
HCT: 33.3 % — ABNORMAL LOW (ref 36.0–46.0)
Hemoglobin: 10.9 g/dL — ABNORMAL LOW (ref 12.0–15.0)
MCH: 30.9 pg (ref 26.0–34.0)
MCHC: 32.7 g/dL (ref 30.0–36.0)
MCV: 94.3 fL (ref 80.0–100.0)
Platelets: 122 10*3/uL — ABNORMAL LOW (ref 150–400)
RBC: 3.53 MIL/uL — ABNORMAL LOW (ref 3.87–5.11)
RDW: 13.5 % (ref 11.5–15.5)
WBC: 6.8 10*3/uL (ref 4.0–10.5)
nRBC: 0 % (ref 0.0–0.2)

## 2019-08-21 LAB — GLUCOSE, CAPILLARY
Glucose-Capillary: 109 mg/dL — ABNORMAL HIGH (ref 70–99)
Glucose-Capillary: 130 mg/dL — ABNORMAL HIGH (ref 70–99)
Glucose-Capillary: 157 mg/dL — ABNORMAL HIGH (ref 70–99)
Glucose-Capillary: 164 mg/dL — ABNORMAL HIGH (ref 70–99)

## 2019-08-21 MED ORDER — METOPROLOL TARTRATE 25 MG PO TABS
25.0000 mg | ORAL_TABLET | Freq: Three times a day (TID) | ORAL | Status: DC
  Administered 2019-08-21 – 2019-08-22 (×6): 25 mg via ORAL
  Filled 2019-08-21 (×7): qty 1

## 2019-08-21 MED ORDER — POLYETHYLENE GLYCOL 3350 17 G PO PACK
17.0000 g | PACK | Freq: Once | ORAL | Status: AC
  Administered 2019-08-21: 17 g via ORAL
  Filled 2019-08-21: qty 1

## 2019-08-21 MED ORDER — AMLODIPINE BESYLATE 10 MG PO TABS
10.0000 mg | ORAL_TABLET | Freq: Every day | ORAL | Status: DC
  Administered 2019-08-21 – 2019-08-24 (×4): 10 mg via ORAL
  Filled 2019-08-21 (×4): qty 1

## 2019-08-21 NOTE — TOC Initial Note (Signed)
Transition of Care [facility] - Initial/Assessment Note    Patient Details  Name: Nicole Meyer MRN: [id number] Date of Birth: 1940/10/06  Transition of Care [facility]) CM/SW Contact:    Bary Castilla, LCSW Phone Number: [phone] 08/21/2019, 3:42 PM  Clinical Narrative:                 CSW met with patient to discuss PT recommendation of a SNF. Patient was aware of recommendation and in agreement with going to a ST SNF. CSW discussed the SNF process.CSW provided patient with medicare.gov rating list.  Patient gave CSW permission to fax referrals out to local facilities.CSW answered questions about the SNF process and the next steps in the process.Patient informed CSW that she has been to [facility] before and she would like to return. CSW noted to patient that she would put that down as her preference.  TOC team will continue to monitor for discharge planning needs.     Expected Discharge Plan: Skilled Nursing Facility Barriers to Discharge: Continued Medical Work up, SNF Pending bed offer, Insurance Authorization   Patient Goals and CMS Choice Patient states their goals for this hospitalization and ongoing recovery are:: To be able to go back home CMS Medicare.gov Compare Post Acute Care list provided to:: Patient Choice offered to / list presented to : Patient  Expected Discharge Plan and Services Expected Discharge Plan: [place] arrangements for the past 2 months: Single Family Home                                      Prior Living Arrangements/Services Living arrangements for the past 2 months: Single Family Home Lives with:: Self, Spouse Patient language and need for interpreter reviewed:: Yes        Need for Family Participation in Patient Care: Yes (Comment) Care giver support system in place?: Yes (comment)      Activities of Daily Living Home Assistive Devices/Equipment: Cane (specify quad or straight), Walker (specify type),  Eyeglasses ADL Screening (condition at time of admission) Patient's cognitive ability adequate to safely complete daily activities?: Yes Is the patient deaf or have difficulty hearing?: No Does the patient have difficulty seeing, even when wearing glasses/contacts?: No Does the patient have difficulty concentrating, remembering, or making decisions?: No Patient able to express need for assistance with ADLs?: Yes Does the patient have difficulty dressing or bathing?: No Independently performs ADLs?: Yes (appropriate for developmental age) Does the patient have difficulty walking or climbing stairs?: Yes Weakness of Legs: Both Weakness of Arms/Hands: None  Permission Sought/Granted      Share Information with NAME: Eddie Dibbles  Permission granted to share info w AGENCY: SNFs  Permission granted to share info w Relationship: Spouse  Permission granted to share info w Contact Information: [phone]  Emotional Assessment Appearance:: Appears stated age Attitude/Demeanor/Rapport: Engaged Affect (typically observed): Accepting, Adaptable Orientation: : Oriented to Self, Oriented to Place, Oriented to  Time, Oriented to Situation      Admission diagnosis:  Atherosclerosis of coronary artery of native heart with unstable angina pectoris ([place]) [I25.110] Coronary artery disease [I25.10] Patient Active Problem List   Diagnosis Date Noted  . Coronary artery disease 08/17/2019  . Atherosclerosis of coronary artery of native heart with unstable angina pectoris ([place]) 08/12/2019  . Unstable angina ([place]) 08/10/2019  . PAD (peripheral artery disease) (  HCC) 02/28/2018  . Lumbar stenosis 02/28/2018  . Chronic anemia 02/28/2018  . Acute CVA (cerebrovascular accident) (HCC) 02/27/2018  . S/P total knee replacement using cement 05/19/2015  . Chronic joint pain 08/30/2013  . Serum total bilirubin elevated 05/03/2013  . Abdominal pain 01/03/2013  . AKI (acute kidney injury) (HCC) 01/03/2013  .  Protein-calorie malnutrition, moderate (HCC) 04/10/2012  . Umbilical hernia s/p primary repair 04/08/2012 03/30/2012  . Obesity (BMI 30-39.9) 01/08/2012  . 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 04/08/2012 12/26/2011  . UTI (urinary tract infection) 12/26/2011  . Chest pain, noncardiac - probably due to giant hiatal hernia 12/25/2011  . COPD (chronic obstructive pulmonary disease) (HCC) 12/24/2011  . Lymphopenia 08/23/2011  . Hemolytic anemia (HCC)   . Leukocytopenia   . HTN (hypertension)   . Asthma   . IBS (irritable bowel syndrome)   . Arthritis   . Anxiety   . Chronic kidney disease (CKD), stage III (moderate) (HCC)   . Chronic fatigue    PCP:  Ehinger, Robert, MD Pharmacy:   [id number] - [place], [place] - [address] [place] [address] Phone: [phone] Fax: [phone]     Social Determinants of Health (SDOH) Interventions    Readmission Risk Interventions No flowsheet data found.  

## 2019-08-21 NOTE — Progress Notes (Signed)
CARDIAC REHAB PHASE I   PRE:  Rate/Rhythm: 85 SR  BP:  Sitting: 148/70      SaO2: 92 RA  MODE:  Ambulation: 60 ft x2   POST:  Rate/Rhythm: 103 ST  BP:  Sitting: 118/75    SaO2: 92 RA  Pt helped to BR than ambulated 59ft in hallway assist of one with front wheel walker, pt took a seated rest break c/o incisional pain. Pt emotional and tearful today, needing lots of support and encouragement. Pt ambulated back to room, and returned to recliner. Encouraged continued ambulation and IS use. Call bell and bedside table within reach. Will continue to follow.  [id number] Rufina Falco, RN BSN 08/21/2019 9:50 AM

## 2019-08-21 NOTE — NC FL2 (Signed)
[place] LEVEL OF CARE SCREENING TOOL     IDENTIFICATION  Patient Name: Nicole Meyer Birthdate: 26-Feb-1940 Sex: female Admission Date (Current Location): 08/12/2019  [facility] and [place] Number:  Herbalist and Address:  [facility] [place], [place] [address] [place], [place] [address]      Provider Number: [id number]  Attending Physician Name and Address:  [name], MD  Relative Name and Phone Number:  Eddie Dibbles [phone]    Current Level of Care: Hospital Recommended Level of Care: [place] Prior Approval Number:    Date Approved/Denied:   PASRR Number: [id number]  Discharge Plan: SNF    Current Diagnoses: Patient Active Problem List   Diagnosis Date Noted  . Coronary artery disease 08/17/2019  . Atherosclerosis of coronary artery of native heart with unstable angina pectoris ([place]) 08/12/2019  . Unstable angina ([place]) 08/10/2019  . PAD (peripheral artery disease) ([place]) 02/28/2018  . Lumbar stenosis 02/28/2018  . Chronic anemia 02/28/2018  . Acute CVA (cerebrovascular accident) ([place]) 02/27/2018  . S/P total knee replacement using cement 05/19/2015  . Chronic joint pain 08/30/2013  . Serum total bilirubin elevated 05/03/2013  . Abdominal pain 01/03/2013  . AKI (acute kidney injury) ([place]) 01/03/2013  . Protein-calorie malnutrition, moderate ([place]) 04/10/2012  . Umbilical hernia s/p primary repair 04/08/2012 03/30/2012  . Obesity (BMI 30-39.9) 01/08/2012  . 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 04/08/2012 12/26/2011  . UTI (urinary tract infection) 12/26/2011  . Chest pain, noncardiac - probably due to giant hiatal hernia 12/25/2011  . COPD (chronic obstructive pulmonary disease) ([place]) 12/24/2011  . Lymphopenia 08/23/2011  . Hemolytic anemia ([place])   . Leukocytopenia   . HTN (hypertension)   . Asthma   . IBS (irritable bowel syndrome)   . Arthritis   . Anxiety   . Chronic  kidney disease (CKD), stage III (moderate) (HCC)   . Chronic fatigue     Orientation RESPIRATION BLADDER Height & Weight     Self, Time, Situation, Place  O2 (1L) Continent, External catheter Weight: 170 lb 1.6 oz (77.2 kg) Height:  5' 1.5" (156.2 cm)  BEHAVIORAL SYMPTOMS/MOOD NEUROLOGICAL BOWEL NUTRITION STATUS      Continent Diet (see discharge summary)  AMBULATORY STATUS COMMUNICATION OF NEEDS Skin   Limited Assist Verbally Other (Comment) (dry, MASD, ecchymosis)                       Personal Care Assistance Level of Assistance  Bathing, Feeding, Dressing Bathing Assistance: Limited assistance Feeding assistance: Independent Dressing Assistance: Limited assistance     Functional Limitations Info  Sight, Hearing, Speech Sight Info: Impaired Hearing Info: Adequate Speech Info: Adequate    SPECIAL CARE FACTORS FREQUENCY  PT (By licensed PT), OT (By licensed OT)     PT Frequency: 5 x per week OT Frequency: 5x per week            Contractures Contractures Info: Not present    Additional Factors Info  Code Status Code Status Info: Full             Current Medications (08/21/2019):  This is the current hospital active medication list Current Facility-Administered Medications  Medication Dose Route Frequency Provider Last Rate Last Admin  . 0.45 % sodium chloride infusion   Intravenous Continuous PRN Nani Skillern, PA-C   Stopped at 08/17/19 1915  . 0.9 %  sodium chloride infusion  250 mL Intravenous  Continuous Lars Pinks M, PA-C 20 mL/hr at 08/18/19 0800 Rate Verify at 08/18/19 0800  . 0.9 %  sodium chloride infusion   Intravenous Continuous Tacy Dura, Donielle M, PA-C      . 0.9 %  sodium chloride infusion  250 mL Intravenous PRN Lightfoot, Lucile Crater, MD      . acetaminophen (TYLENOL) tablet 1,000 mg  1,000 mg Oral Q6H Lars Pinks M, PA-C   1,000 mg at 08/21/19 1241   Or  . acetaminophen (TYLENOL) 160 MG/5ML solution 1,000 mg  1,000  mg Per Tube Q6H Tacy Dura, Donielle M, PA-C      . albuterol (PROVENTIL) (2.5 MG/3ML) 0.083% nebulizer solution 3 mL  3 mL Inhalation Q6H PRN Lars Pinks M, PA-C      . amLODipine (NORVASC) tablet 10 mg  10 mg Oral Daily Roddenberry, Myron G, PA-C   10 mg at 08/21/19 0954  . aspirin EC tablet 325 mg  325 mg Oral Daily Lars Pinks M, [place]   325 mg at 08/21/19 2951   Or  . aspirin chewable tablet 324 mg  324 mg Per Tube Daily Tacy Dura, Donielle M, PA-C      . bisacodyl (DULCOLAX) EC tablet 10 mg  10 mg Oral Daily Lars Pinks M, PA-C   10 mg at 08/21/19 8841   Or  . bisacodyl (DULCOLAX) suppository 10 mg  10 mg Rectal Daily Lars Pinks M, PA-C      . Chlorhexidine Gluconate Cloth 2 % PADS 6 each  6 each Topical Daily [name], MD   6 each at 08/21/19 0955  . dextrose 50 % solution 0-50 mL  0-50 mL Intravenous PRN Lars Pinks M, PA-C      . docusate sodium (COLACE) capsule 200 mg  200 mg Oral Daily Lars Pinks M, PA-C   200 mg at 08/21/19 0953  . enoxaparin (LOVENOX) injection 40 mg  40 mg Subcutaneous QHS [name], MD   40 mg at 08/20/19 2119  . hydrALAZINE (APRESOLINE) injection 10 mg  10 mg Intravenous Q6H PRN [name], MD   10 mg at 08/21/19 0348  . insulin aspart (novoLOG) injection 0-24 Units  0-24 Units Subcutaneous TID AC & HS [name], MD   2 Units at 08/20/19 1641  . insulin regular, human (MYXREDLIN) 100 units/ 100 mL infusion   Intravenous Continuous Lars Pinks M, PA-C 0.4 mL/hr at 08/18/19 0800 Rate Verify at 08/18/19 0800  . lactated ringers infusion 500 mL  500 mL Intravenous Once PRN Lars Pinks M, PA-C      . lactated ringers infusion   Intravenous Continuous Lars Pinks M, PA-C      . lactated ringers infusion   Intravenous Continuous Lars Pinks M, PA-C 20 mL/hr at 08/18/19 0500 Rate Verify at 08/18/19 0500  . MEDLINE mouth rinse  15 mL Mouth Rinse BID  [name], MD   15 mL at 08/21/19 0955  . metoprolol tartrate (LOPRESSOR) injection 2.5-5 mg  2.5-5 mg Intravenous Q2H PRN Lars Pinks M, PA-C   2.5 mg at 08/19/19 1849  . metoprolol tartrate (LOPRESSOR) tablet 25 mg  25 mg Oral TID Roddenberry, Myron G, PA-C   25 mg at 08/21/19 0955  . midazolam (VERSED) injection 2 mg  2 mg Intravenous Q1H PRN Lars Pinks M, PA-C      . morphine 2 MG/ML injection 1-4 mg  1-4 mg Intravenous Q1H PRN Lars Pinks M, PA-C   2 mg at 08/18/19  1126  . Muscle Rub CREA   Topical Daily PRN Lars Pinks M, PA-C      . ondansetron [facility]) injection 4 mg  4 mg Intravenous Q6H PRN Nani Skillern, PA-C   4 mg at 08/17/19 1934  . oxyCODONE (Oxy IR/ROXICODONE) immediate release tablet 5 mg  5 mg Oral Q3H PRN Nani Skillern, PA-C   5 mg at 08/21/19 6415  . pantoprazole (PROTONIX) EC tablet 40 mg  40 mg Oral Daily Lars Pinks M, PA-C   40 mg at 08/21/19 8309  . polyethylene glycol (MIRALAX / GLYCOLAX) packet 17 g  17 g Oral Once Roddenberry, Myron G, PA-C      . predniSONE (DELTASONE) tablet 5 mg  5 mg Oral QPC breakfast Lars Pinks M, PA-C   5 mg at 08/21/19 0954  . rosuvastatin (CRESTOR) tablet 20 mg  20 mg Oral Daily Lars Pinks M, PA-C   20 mg at 08/21/19 4076  . sodium chloride flush (NS) 0.9 % injection 10-40 mL  10-40 mL Intracatheter Q12H Lightfoot, Lucile Crater, MD   10 mL at 08/20/19 0950  . sodium chloride flush (NS) 0.9 % injection 10-40 mL  10-40 mL Intracatheter PRN Lightfoot, Harrell O, MD      . sodium chloride flush (NS) 0.9 % injection 3 mL  3 mL Intravenous Q12H Lars Pinks M, PA-C   3 mL at 08/21/19 0957  . sodium chloride flush (NS) 0.9 % injection 3 mL  3 mL Intravenous PRN Lars Pinks M, PA-C      . sodium chloride flush (NS) 0.9 % injection 3 mL  3 mL Intravenous Q12H [name], MD   3 mL at 08/21/19 0957  . sodium chloride flush (NS) 0.9 % injection 3 mL   3 mL Intravenous PRN Lightfoot, Lucile Crater, MD      . traMADol (ULTRAM) tablet 50 mg  50 mg Oral Q4H PRN Lars Pinks M, PA-C   50 mg at 08/20/19 8088     Discharge Medications: Please see discharge summary for a list of discharge medications.  Relevant Imaging Results:  Relevant Lab Results:   Additional Information SSN# [id number]  [place], [place]

## 2019-08-21 NOTE — Plan of Care (Signed)
Poc progressing.  

## 2019-08-21 NOTE — Progress Notes (Signed)
Mobility Specialist - Progress Note   08/21/19 1147  Mobility  Activity Transferred:  Chair to bed  Level of Assistance Modified independent, requires aide device or extra time  Assistive Device Front wheel walker  Mobility Response Tolerated fair  Mobility performed by Mobility specialist  $Mobility charge 1 Mobility    Pre-mobility: 80 HR, 156/76 BP, 94% SpO2 Post-mobility: 80 HR, 166/74 BP, 94%SPO2  Pt initially agreeable to ambulating, but endorsed feeling weak. When standing she asked to just go to the bed as she had aching pain she rated a 7/10 around her incision site.   Pricilla Handler Mobility Specialist Mobility Specialist Phone: [phone]

## 2019-08-21 NOTE — NC FL2 (Signed)
[place] LEVEL OF CARE SCREENING TOOL     IDENTIFICATION  Patient Name: Nicole Meyer Birthdate: December 24, 1940 Sex: female Admission Date (Current Location): 08/12/2019  [facility] and [place] Number:  Herbalist and Address:  [facility], [place] [address] [place], [place] [address]      Provider Number: [id number]  Attending Physician Name and Address:  [name], MD  Relative Name and Phone Number:  Eddie Dibbles [phone]    Current Level of Care: Hospital Recommended Level of Care: [place] Prior Approval Number:    Date Approved/Denied:   PASRR Number: [id number]  Discharge Plan: SNF    Current Diagnoses: Patient Active Problem List   Diagnosis Date Noted  . Coronary artery disease 08/17/2019  . Atherosclerosis of coronary artery of native heart with unstable angina pectoris ([place]) 08/12/2019  . Unstable angina ([place]) 08/10/2019  . PAD (peripheral artery disease) ([place]) 02/28/2018  . Lumbar stenosis 02/28/2018  . Chronic anemia 02/28/2018  . Acute CVA (cerebrovascular accident) ([place]) 02/27/2018  . S/P total knee replacement using cement 05/19/2015  . Chronic joint pain 08/30/2013  . Serum total bilirubin elevated 05/03/2013  . Abdominal pain 01/03/2013  . AKI (acute kidney injury) ([place]) 01/03/2013  . Protein-calorie malnutrition, moderate ([place]) 04/10/2012  . Umbilical hernia s/p primary repair 04/08/2012 03/30/2012  . Obesity (BMI 30-39.9) 01/08/2012  . 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 04/08/2012 12/26/2011  . UTI (urinary tract infection) 12/26/2011  . Chest pain, noncardiac - probably due to giant hiatal hernia 12/25/2011  . COPD (chronic obstructive pulmonary disease) ([place]) 12/24/2011  . Lymphopenia 08/23/2011  . Hemolytic anemia ([place])   . Leukocytopenia   . HTN (hypertension)   . Asthma   . IBS (irritable bowel syndrome)   . Arthritis   . Anxiety   . Chronic  kidney disease (CKD), stage III (moderate) (HCC)   . Chronic fatigue     Orientation RESPIRATION BLADDER Height & Weight     Self, Time, Situation, Place  O2 (1L) Continent, External catheter Weight: 170 lb 1.6 oz (77.2 kg) Height:  5' 1.5" (156.2 cm)  BEHAVIORAL SYMPTOMS/MOOD NEUROLOGICAL BOWEL NUTRITION STATUS      Continent Diet (see discharge summary)  AMBULATORY STATUS COMMUNICATION OF NEEDS Skin   Limited Assist Verbally Other (Comment) (dry, MASD, ecchymosis)                       Personal Care Assistance Level of Assistance  Bathing, Feeding, Dressing Bathing Assistance: Limited assistance Feeding assistance: Independent Dressing Assistance: Limited assistance     Functional Limitations Info  Sight, Hearing, Speech Sight Info: Impaired Hearing Info: Adequate Speech Info: Adequate    SPECIAL CARE FACTORS FREQUENCY  PT (By licensed PT), OT (By licensed OT)     PT Frequency: 5 x per week OT Frequency: 5x per week            Contractures Contractures Info: Not present    Additional Factors Info  Code Status Code Status Info: Full             Current Medications (08/21/2019):  This is the current hospital active medication list Current Facility-Administered Medications  Medication Dose Route Frequency Provider Last Rate Last Admin  . 0.45 % sodium chloride infusion   Intravenous Continuous PRN Nani Skillern, PA-C   Stopped at 08/17/19 1915  . 0.9 %  sodium chloride infusion  250 mL Intravenous  Continuous Lars Pinks M, PA-C 20 mL/hr at 08/18/19 0800 Rate Verify at 08/18/19 0800  . 0.9 %  sodium chloride infusion   Intravenous Continuous Tacy Dura, Donielle M, PA-C      . 0.9 %  sodium chloride infusion  250 mL Intravenous PRN Lightfoot, Lucile Crater, MD      . acetaminophen (TYLENOL) tablet 1,000 mg  1,000 mg Oral Q6H Lars Pinks M, PA-C   1,000 mg at 08/21/19 1241   Or  . acetaminophen (TYLENOL) 160 MG/5ML solution 1,000 mg  1,000  mg Per Tube Q6H Tacy Dura, Donielle M, PA-C      . albuterol (PROVENTIL) (2.5 MG/3ML) 0.083% nebulizer solution 3 mL  3 mL Inhalation Q6H PRN Lars Pinks M, PA-C      . amLODipine (NORVASC) tablet 10 mg  10 mg Oral Daily Roddenberry, Myron G, PA-C   10 mg at 08/21/19 0954  . aspirin EC tablet 325 mg  325 mg Oral Daily Lars Pinks M, [place]   325 mg at 08/21/19 8841   Or  . aspirin chewable tablet 324 mg  324 mg Per Tube Daily Tacy Dura, Donielle M, PA-C      . bisacodyl (DULCOLAX) EC tablet 10 mg  10 mg Oral Daily Lars Pinks M, PA-C   10 mg at 08/21/19 6606   Or  . bisacodyl (DULCOLAX) suppository 10 mg  10 mg Rectal Daily Lars Pinks M, PA-C      . Chlorhexidine Gluconate Cloth 2 % PADS 6 each  6 each Topical Daily [name], MD   6 each at 08/21/19 0955  . dextrose 50 % solution 0-50 mL  0-50 mL Intravenous PRN Lars Pinks M, PA-C      . docusate sodium (COLACE) capsule 200 mg  200 mg Oral Daily Lars Pinks M, PA-C   200 mg at 08/21/19 0953  . enoxaparin (LOVENOX) injection 40 mg  40 mg Subcutaneous QHS [name], MD   40 mg at 08/20/19 2119  . hydrALAZINE (APRESOLINE) injection 10 mg  10 mg Intravenous Q6H PRN [name], MD   10 mg at 08/21/19 0348  . insulin aspart (novoLOG) injection 0-24 Units  0-24 Units Subcutaneous TID AC & HS [name], MD   2 Units at 08/20/19 1641  . insulin regular, human (MYXREDLIN) 100 units/ 100 mL infusion   Intravenous Continuous Lars Pinks M, PA-C 0.4 mL/hr at 08/18/19 0800 Rate Verify at 08/18/19 0800  . lactated ringers infusion 500 mL  500 mL Intravenous Once PRN Lars Pinks M, PA-C      . lactated ringers infusion   Intravenous Continuous Lars Pinks M, PA-C      . lactated ringers infusion   Intravenous Continuous Lars Pinks M, PA-C 20 mL/hr at 08/18/19 0500 Rate Verify at 08/18/19 0500  . MEDLINE mouth rinse  15 mL Mouth Rinse BID  [name], MD   15 mL at 08/21/19 0955  . metoprolol tartrate (LOPRESSOR) injection 2.5-5 mg  2.5-5 mg Intravenous Q2H PRN Lars Pinks M, PA-C   2.5 mg at 08/19/19 1849  . metoprolol tartrate (LOPRESSOR) tablet 25 mg  25 mg Oral TID Roddenberry, Myron G, PA-C   25 mg at 08/21/19 0955  . midazolam (VERSED) injection 2 mg  2 mg Intravenous Q1H PRN Lars Pinks M, PA-C      . morphine 2 MG/ML injection 1-4 mg  1-4 mg Intravenous Q1H PRN Lars Pinks M, PA-C   2 mg at 08/18/19  1126  . Muscle Rub CREA   Topical Daily PRN Lars Pinks M, PA-C      . ondansetron [facility]) injection 4 mg  4 mg Intravenous Q6H PRN Nani Skillern, PA-C   4 mg at 08/17/19 1934  . oxyCODONE (Oxy IR/ROXICODONE) immediate release tablet 5 mg  5 mg Oral Q3H PRN Nani Skillern, PA-C   5 mg at 08/21/19 4097  . pantoprazole (PROTONIX) EC tablet 40 mg  40 mg Oral Daily Lars Pinks M, PA-C   40 mg at 08/21/19 3532  . polyethylene glycol (MIRALAX / GLYCOLAX) packet 17 g  17 g Oral Once Roddenberry, Myron G, PA-C      . predniSONE (DELTASONE) tablet 5 mg  5 mg Oral QPC breakfast Lars Pinks M, PA-C   5 mg at 08/21/19 0954  . rosuvastatin (CRESTOR) tablet 20 mg  20 mg Oral Daily Lars Pinks M, PA-C   20 mg at 08/21/19 9924  . sodium chloride flush (NS) 0.9 % injection 10-40 mL  10-40 mL Intracatheter Q12H Lightfoot, Lucile Crater, MD   10 mL at 08/20/19 0950  . sodium chloride flush (NS) 0.9 % injection 10-40 mL  10-40 mL Intracatheter PRN Lightfoot, Harrell O, MD      . sodium chloride flush (NS) 0.9 % injection 3 mL  3 mL Intravenous Q12H Lars Pinks M, PA-C   3 mL at 08/21/19 0957  . sodium chloride flush (NS) 0.9 % injection 3 mL  3 mL Intravenous PRN Lars Pinks M, PA-C      . sodium chloride flush (NS) 0.9 % injection 3 mL  3 mL Intravenous Q12H [name], MD   3 mL at 08/21/19 0957  . sodium chloride flush (NS) 0.9 % injection 3 mL   3 mL Intravenous PRN Lightfoot, Lucile Crater, MD      . traMADol (ULTRAM) tablet 50 mg  50 mg Oral Q4H PRN Lars Pinks M, PA-C   50 mg at 08/20/19 2683     Discharge Medications: Please see discharge summary for a list of discharge medications.  Relevant Imaging Results:  Relevant Lab Results:   Additional Information SSN# [id number]  [place], [place]

## 2019-08-21 NOTE — Progress Notes (Addendum)
      [address]       [place] [address]             [phone]       4 Days Post-Op Procedure(s) (LRB): CORONARY ARTERY BYPASS GRAFTING (CABG) x 4 using LIMA to LAD(d) and endscopic right greater saphenous vein harvest: SVG to Diag1; SVG to OM1; SVG to PDA. Flowtrack: Only (N/A) TRANSESOPHAGEAL ECHOCARDIOGRAM (TEE) (N/A) ENDOVEIN HARVEST OF GREATER SAPHENOUS VEIN (Right) Subjective: Sitting up eating breakfast, says she has more soreness in her sternum today. Otherwise no new concerns.  No BM yet.    Objective: Vital signs in last 24 hours: Temp:  [98.3 F (36.8 C)-98.7 F (37.1 C)] 98.5 F (36.9 C) (07/03 0839) Pulse Rate:  [73-87] 82 (07/03 0839) Cardiac Rhythm: Normal sinus rhythm (07/03 0746) Resp:  [13-20] 20 (07/03 0839) BP: (142-162)/(68-92) 155/78 (07/03 0839) SpO2:  [91 %-98 %] 92 % (07/03 0839) Weight:  [77.2 kg] 77.2 kg (07/03 0543)    Intake/Output from previous day: 07/02 0701 - 07/03 0700 In: 240 [P.O.:240] Out: -  Intake/Output this shift: No intake/output data recorded.  General appearance: alert, cooperative and no distress Neurologic: No focal deficits Heart: SR.  Lungs: clear to auscultation bilaterally Abdomen: Soft, NT, rare bowel sounds Extremities: RLE incisions are approximated with staples and are dry.  Minimal edema. Wound: Sternal incision skin edges approximated, expected bruising adjacent to incision. No erythema or drainage.   Lab Results: Recent Labs    08/19/19 0406 08/21/19 0344  WBC 9.3 6.8  HGB 12.0 10.9*  HCT 36.8 33.3*  PLT 86* 122*   BMET:  Recent Labs    08/19/19 0406 08/21/19 0344  NA 136 135  K 4.3 3.8  CL 107 102  CO2 21* 22  GLUCOSE 113* 105*  BUN 21 35*  CREATININE 1.21* 1.54*  CALCIUM 8.4* 8.6*    PT/INR: No results for input(s): LABPROT, INR in the last 72 hours. ABG    Component Value Date/Time   PHART 7.365 08/17/2019 1912   HCO3 21.0 08/17/2019 1912   TCO2 22 08/17/2019  1912   ACIDBASEDEF 4.0 (H) 08/17/2019 1912   O2SAT 96.0 08/17/2019 1912   CBG (last 3)  Recent Labs    08/20/19 1607 08/20/19 2059 08/21/19 0621  GLUCAP 127* 113* 109*    Assessment/Plan: S/P Procedure(s) (LRB): CORONARY ARTERY BYPASS GRAFTING (CABG) x 4 using LIMA to LAD(d) and endscopic right greater saphenous vein harvest: SVG to Diag1; SVG to OM1; SVG to PDA. Flowtrack: Only (N/A) TRANSESOPHAGEAL ECHOCARDIOGRAM (TEE) (N/A) ENDOVEIN HARVEST OF GREATER SAPHENOUS VEIN (Right)  -POD4 CABG for MVCAD presenting with exertional angina, Normal EF. [name] Progressing slowly with ambulation, on RA with adequate O2 sats.  Stable cardiac rhythm. Continue ASA, statin, B. Blocker. Remove pacer wires today.   -Hypertension- SBP in 150's to 160's. Will increase the metoprolol to 25mg  TID today and observe.   -Thrombocytopenia- Plt count recovering,  122,000today.  -CKD, stage 3- Creat bumped to 1.5. Wt now at pre-op level. Will d/c Lasix. Monitor.   -Mild volume excess- resolving.  -DVT PPX- on daily enoxaparin.   -Disposition- PT now recommending SNF at discharge. Ms. Giovanni is in agreement with this and mentioned she would like to return to [facility] since she had a good experience there after her knee replacement.    LOS: 9 days    Antony Odea, [place] [phone] 08/21/2019  Agree with above Doing well dispo planning

## 2019-08-22 ENCOUNTER — Inpatient Hospital Stay (HOSPITAL_COMMUNITY): Payer: Medicare PPO

## 2019-08-22 LAB — GLUCOSE, CAPILLARY
Glucose-Capillary: 106 mg/dL — ABNORMAL HIGH (ref 70–99)
Glucose-Capillary: 171 mg/dL — ABNORMAL HIGH (ref 70–99)
Glucose-Capillary: 147 mg/dL — ABNORMAL HIGH (ref 70–99)
Glucose-Capillary: 103 mg/dL — ABNORMAL HIGH (ref 70–99)

## 2019-08-22 LAB — BASIC METABOLIC PANEL
Anion gap: 10 (ref 5–15)
BUN: 32 mg/dL — ABNORMAL HIGH (ref 8–23)
CO2: 22 mmol/L (ref 22–32)
Calcium: 8.7 mg/dL — ABNORMAL LOW (ref 8.9–10.3)
Chloride: 103 mmol/L (ref 98–111)
Creatinine, Ser: 1.36 mg/dL — ABNORMAL HIGH (ref 0.44–1.00)
GFR calc Af Amer: 43 mL/min — ABNORMAL LOW (ref >60)
GFR calc non Af Amer: 37 mL/min — ABNORMAL LOW (ref >60)
Glucose, Bld: 106 mg/dL — ABNORMAL HIGH (ref 70–99)
Potassium: 3.8 mmol/L (ref 3.5–5.1)
Sodium: 135 mmol/L (ref 135–145)

## 2019-08-22 LAB — CBC
HCT: 34 % — ABNORMAL LOW (ref 36.0–46.0)
Hemoglobin: 11.2 g/dL — ABNORMAL LOW (ref 12.0–15.0)
MCH: 31.2 pg (ref 26.0–34.0)
MCHC: 32.9 g/dL (ref 30.0–36.0)
MCV: 94.7 fL (ref 80.0–100.0)
Platelets: 145 10*3/uL — ABNORMAL LOW (ref 150–400)
RBC: 3.59 MIL/uL — ABNORMAL LOW (ref 3.87–5.11)
RDW: 13.4 % (ref 11.5–15.5)
WBC: 5.8 10*3/uL (ref 4.0–10.5)
nRBC: 0 % (ref 0.0–0.2)

## 2019-08-22 MED ORDER — SORBITOL 70 % SOLN
30.0000 mL | Freq: Once | Status: AC
  Administered 2019-08-22: 30 mL via ORAL
  Filled 2019-08-22: qty 30

## 2019-08-22 NOTE — Progress Notes (Addendum)
      [address]       [place] [address]             [phone]        5 Days Post-Op Procedure(s) (LRB): CORONARY ARTERY BYPASS GRAFTING (CABG) x 4 using LIMA to LAD(d) and endscopic right greater saphenous vein harvest: SVG to Diag1; SVG to OM1; SVG to PDA. Flowtrack: Only (N/A) TRANSESOPHAGEAL ECHOCARDIOGRAM (TEE) (N/A) ENDOVEIN HARVEST OF GREATER SAPHENOUS VEIN (Right) Subjective: Resting in bed, feels tired after returning from having a CXR.  No new concerns.  No BM yet.   Objective: Vital signs in last 24 hours: Temp:  [98 F (36.7 C)-99 F (37.2 C)] 98.4 F (36.9 C) (07/04 0800) Pulse Rate:  [71-84] 77 (07/04 0800) Resp:  [14-19] 15 (07/04 0800) BP: (118-166)/(65-74) 118/68 (07/04 0800) SpO2:  [93 %-97 %] 95 % (07/04 0800) Weight:  [75.4 kg] 75.4 kg (07/04 0438)    Intake/Output from previous day: 07/03 0701 - 07/04 0700 In: 820 [P.O.:820] Out: -  Intake/Output this shift: Total I/O In: -  Out: 250 [Urine:250]  General appearance:alert, cooperative and no distress Neurologic:No focal deficits Heart:stable SR. Lungs:clear to auscultation bilaterally Abdomen:Soft, NT,few bowel sounds Extremities:RLE incisions are approximated with staples and are dry. Minimal edema. Wound:Sternal incision skin edges approximated, expected bruising adjacent to incision.No erythema or drainage.   Lab Results: Recent Labs    08/21/19 0344 08/22/19 0517  WBC 6.8 5.8  HGB 10.9* 11.2*  HCT 33.3* 34.0*  PLT 122* 145*   BMET:  Recent Labs    08/21/19 0344 08/22/19 0517  NA 135 135  K 3.8 3.8  CL 102 103  CO2 22 22  GLUCOSE 105* 106*  BUN 35* 32*  CREATININE 1.54* 1.36*  CALCIUM 8.6* 8.7*    PT/INR: No results for input(s): LABPROT, INR in the last 72 hours. ABG    Component Value Date/Time   PHART 7.365 08/17/2019 1912   HCO3 21.0 08/17/2019 1912   TCO2 22 08/17/2019 1912   ACIDBASEDEF 4.0 (H) 08/17/2019 1912   O2SAT 96.0  08/17/2019 1912   CBG (last 3)  Recent Labs    08/21/19 1617 08/21/19 2119 08/22/19 0609  GLUCAP 157* 164* 106*    Assessment/Plan: S/P Procedure(s) (LRB): CORONARY ARTERY BYPASS GRAFTING (CABG) x 4 using LIMA to LAD(d) and endscopic right greater saphenous vein harvest: SVG to Diag1; SVG to OM1; SVG to PDA. Flowtrack: Only (N/A) TRANSESOPHAGEAL ECHOCARDIOGRAM (TEE) (N/A) ENDOVEIN HARVEST OF GREATER SAPHENOUS VEIN (Right)  -POD5 CABG for MVCAD presenting with exertional angina, Normal EF.  Progressing slowly with ambulation, on RA and maintaining adequate O2 sats. Stable cardiac rhythm. Continue ASA, statin, B. Blocker.  -Hypertension- SBP trending down to 120's to 140's. Will continue metoprolol at 25mg  TID today.  -Thrombocytopenia-resolved  -CKD, stage 3- Creat 1.36. Monitor.   -Mild volume excess- resolving.  -DVT PPX- on daily enoxaparin.   -Disposition- PT now recommending SNF at discharge. Ms. Glaeser is in agreement with this. TOC social work has initiated the process. FL2 cosigned.    LOS: 10 days    Antony Odea, PA-C  08/22/2019 Patient examined and today's chest x-ray image personally reviewed. Patient is progressing but would benefit from discharge to a skilled nursing facility as recommended by physical therapy.  We will make plans for this to her after the holiday weekend.  patient examined and medical record reviewed,agree with above note. Tharon Aquas Trigt III 08/22/2019

## 2019-08-22 NOTE — Progress Notes (Signed)
Mobility Specialist - Progress Note   08/22/19 1137  Mobility  Activity Stood at bedside [facility] in place)  Level of Assistance Modified independent, requires aide device or extra time  Assistive Device Front wheel walker  Mobility Response Tolerated well  Mobility performed by Mobility specialist  $Mobility charge 1 Mobility    Pre-mobility: 72 HR, 144/72 BP, 92% SpO2 During mobility: 80 HR, 93% SpO2 Post-mobility: 73 HR, 141/84 BP, 96% SPO2  Pt did not want to ambulate further due to feeling weak.   Pricilla Handler Mobility Specialist Mobility Specialist Phone: [phone]

## 2019-08-22 NOTE — Progress Notes (Signed)
Pt refused to walk last night and this am. Last night she was too tired and this am stated she just came back from x-ray and wants to rest. Pt has walked to bathroom twice with wheelchair. Will continue to monitor.

## 2019-08-22 NOTE — TOC Progression Note (Signed)
Transition of Care [facility]) - Progression Note    Patient Details  Name: Nicole Meyer MRN: [id number] Date of Birth: [birth year]  Transition of Care [facility]) CM/SW [place], [place] Phone Number: [phone] 08/22/2019, 12:56 PM  Clinical Narrative:     CSW initiated authorization with a start date of 7/5 and faxed clinicals. Once facility is chosen would need to be given to [facility]. Reference # [id number].  TOC team will continue to assist with discharge planning needs.   Expected Discharge Plan: [place] Barriers to Discharge: Continued Medical Work up, SNF Pending bed offer, Ship broker  Expected Discharge Plan and Services Expected Discharge Plan: [place] arrangements for the past 2 months: Single Family Home                                       Social Determinants of Health (SDOH) Interventions    Readmission Risk Interventions No flowsheet data found.

## 2019-08-22 NOTE — Plan of Care (Signed)
Poc progressing.  

## 2019-08-23 LAB — GLUCOSE, CAPILLARY
Glucose-Capillary: 119 mg/dL — ABNORMAL HIGH (ref 70–99)
Glucose-Capillary: 138 mg/dL — ABNORMAL HIGH (ref 70–99)
Glucose-Capillary: 141 mg/dL — ABNORMAL HIGH (ref 70–99)
Glucose-Capillary: 128 mg/dL — ABNORMAL HIGH (ref 70–99)

## 2019-08-23 MED ORDER — METOPROLOL TARTRATE 50 MG PO TABS
50.0000 mg | ORAL_TABLET | Freq: Two times a day (BID) | ORAL | Status: DC
  Administered 2019-08-23 – 2019-08-24 (×3): 50 mg via ORAL
  Filled 2019-08-23 (×3): qty 1

## 2019-08-23 NOTE — Progress Notes (Signed)
Pt refused to walk last night saud she was too tired. This morning, pt said she wants to take pain medication and walk with therapy people. Will continue to monitor.

## 2019-08-23 NOTE — TOC Progression Note (Signed)
Transition of Care [facility]) - Progression Note    Patient Details  Name: Nicole Meyer MRN: [id number] Date of Birth: 06-15-1940  Transition of Care [facility]) CM/SW [place], [place] Phone Number: 08/23/2019, 3:20 PM  Clinical Narrative:     CSW visit with patient at bedside ,along with her daughter Nicole Meyer, and her spouse Nicole Meyer. CSW introduced self and explained role. CSW informed [facility] was unable to make offer for rehab. Patient and family decided on [place] as another option.  CSW reached out to [facility] to confirm bed offer- waiting on response. Insurance authorization still pending  Thurmond Butts, MSW, [place] Clinical Social Worker    Expected Discharge Plan: Skilled Nursing Facility Barriers to Discharge: Continued Medical Work up, SNF Pending bed offer, Ship broker  Expected Discharge Plan and Services Expected Discharge Plan: [place] arrangements for the past 2 months: Single Family Home                                       Social Determinants of Health (SDOH) Interventions    Readmission Risk Interventions No flowsheet data found.

## 2019-08-23 NOTE — TOC Progression Note (Addendum)
Transition of Care [facility]) - Progression Note    Patient Details  Name: Nicole Meyer MRN: [id number] Date of Birth: [birth year]  Transition of Care [facility]) CM/SW [place], [place] Phone Number: 08/23/2019, 12:27 PM  Clinical Narrative:     [place] office is closed- insurance authorization is pending- reference # [id number] CSW contacted [facility]- waiting on [place], MSW, [place] Social Worker   Expected Discharge Plan: [place] Barriers to Discharge: Continued Medical Work up, SNF Pending bed offer, Ship broker  Expected Discharge Plan and Services Expected Discharge Plan: [place] arrangements for the past 2 months: Single Family Home                                       Social Determinants of Health (SDOH) Interventions    Readmission Risk Interventions No flowsheet data found.

## 2019-08-23 NOTE — Progress Notes (Signed)
Physical Therapy Treatment Patient Details Name: Nicole Meyer MRN: [id number] DOB: 1940-06-23 Today's Date: 08/23/2019    History of Present Illness Pt s/p CABG on 6/29. PMH - CVA, Rt TKR, anxiety, depression, arthritis, copd, htn, ckd, polymyalgia    PT Comments    Patient progressing slowly towards PT goals. Improved ambulation distance with use of RW for support and chair follow. Requires encouragement and coaxing to increase activity. Requires cues to adhere to sternal precautions during mobility. Pt moaning due to weakness and continues to be anxious, which is baseline. Continues to be appropriate for SNF.Encouraged walking 2 more times today. VSS on RA. Will follow.    Follow Up Recommendations  Supervision for mobility/OOB;SNF     Equipment Recommendations  None recommended by PT    Recommendations for Other Services       Precautions / Restrictions Restrictions Weight Bearing Restrictions: Yes (sternal precautions)    Mobility  Bed Mobility Overal bed mobility: Needs Assistance Bed Mobility: Supine to Sit     Supine to sit: Min assist;HOB elevated     General bed mobility comments: assist with trunk to get to EOB.  Transfers Overall transfer level: Needs assistance Equipment used: Rolling walker (2 wheeled) Transfers: Sit to/from Stand Sit to Stand: Min assist         General transfer comment: Assist for stability to rise and cues for forward momentum. Holding heart pillow to stand from EOB and toilet x1. Transferred to chair post ambulation.  Ambulation/Gait Ambulation/Gait assistance: Min guard Gait Distance (Feet): 70 Feet Assistive device: Rolling walker (2 wheeled) Gait Pattern/deviations: Step-through pattern;Decreased stride length;Trunk flexed Gait velocity: decreased   General Gait Details: Slow, mostly steady gait with RW for support; anxious about LE weakness and fatigue. 2/4 DOE. Needs encouragement. Moaning due to reports of  weakness.   Stairs             Wheelchair Mobility    Modified Rankin (Stroke Patients Only)       Balance Overall balance assessment: Needs assistance Sitting-balance support: Feet supported;No upper extremity supported Sitting balance-Leahy Scale: Fair     Standing balance support: During functional activity Standing balance-Leahy Scale: Fair                              Cognition Arousal/Alertness: Awake/alert Behavior During Therapy: Anxious Overall Cognitive Status: Impaired/Different from baseline Area of Impairment: Memory                     Memory: Decreased recall of precautions         General Comments: Needs cues to adhere to sternal precautions during mobility.      Exercises      General Comments General comments (skin integrity, edema, etc.): VSS on RA.      Pertinent Vitals/Pain Pain Assessment: No/denies pain    Home Living                      Prior Function            PT Goals (current goals can now be found in the care plan section) Progress towards PT goals: Progressing toward goals    Frequency    Min 3X/week      PT Plan Current plan remains appropriate    Co-evaluation              AM-PAC PT "6 Clicks" Mobility  Outcome Measure  Help needed turning from your back to your side while in a flat bed without using bedrails?: A Little Help needed moving from lying on your back to sitting on the side of a flat bed without using bedrails?: A Little Help needed moving to and from a bed to a chair (including a wheelchair)?: A Little Help needed standing up from a chair using your arms (e.g., wheelchair or bedside chair)?: A Little Help needed to walk in hospital room?: A Little Help needed climbing 3-5 steps with a railing? : A Lot 6 Click Score: 17    End of Session Equipment Utilized During Treatment: Gait belt Activity Tolerance: Patient limited by fatigue Patient left: in  chair;with call bell/phone within reach Nurse Communication: Mobility status PT Visit Diagnosis: Unsteadiness on feet (R26.81);Other abnormalities of gait and mobility (R26.89);Muscle weakness (generalized) (M62.81)     Time: 8916-9450 PT Time Calculation (min) (ACUTE ONLY): 26 min  Charges:  $Gait Training: 8-22 mins $Therapeutic Activity: 8-22 mins                     Marisa Severin, PT, DPT Acute Rehabilitation Services Pager [phone] Office [place] 08/23/2019, 9:43 AM

## 2019-08-23 NOTE — Progress Notes (Signed)
Mobility Specialist - Progress Note   08/23/19 1402  Mobility  Activity Ambulated in hall  Level of Assistance Modified independent, requires aide device or extra time  Assistive Device Four wheel walker  Distance Ambulated (ft)  (70 x 2)  Mobility Response Tolerated well  Mobility performed by Mobility specialist  $Mobility charge 1 Mobility    Pre-mobility: 62 HR, 99% SpO2 During mobility: 73 HR, 96% SpO2 Post-mobility: 62 HR, 93% SpO2  Pt needed one sitting rest break due to fatigue.  Pricilla Handler Mobility Specialist Mobility Specialist Phone: [phone]

## 2019-08-23 NOTE — Progress Notes (Addendum)
      [address]       [place] [address]             [phone]        6 Days Post-Op Procedure(s) (LRB): CORONARY ARTERY BYPASS GRAFTING (CABG) x 4 using LIMA to LAD(d) and endscopic right greater saphenous vein harvest: SVG to Diag1; SVG to OM1; SVG to PDA. Flowtrack: Only (N/A) TRANSESOPHAGEAL ECHOCARDIOGRAM (TEE) (N/A) ENDOVEIN HARVEST OF GREATER SAPHENOUS VEIN (Right) Subjective: Awake and alert, walked this morning but declined to walk yesterday. Tolerating PO's, BM yesterday.  Objective: Vital signs in last 24 hours: Temp:  [97.6 F (36.4 C)-98.7 F (37.1 C)] 98.3 F (36.8 C) (07/05 0745) Pulse Rate:  [69-88] 88 (07/05 0745) Cardiac Rhythm: Normal sinus rhythm (07/05 0704) Resp:  [14-19] 18 (07/05 0745) BP: (129-150)/(63-85) 142/85 (07/05 0745) SpO2:  [94 %-97 %] 95 % (07/05 0745) Weight:  [74.6 kg] 74.6 kg (07/05 0342)    Intake/Output from previous day: 07/04 0701 - 07/05 0700 In: 100 [P.O.:100] Out: 251 [Urine:250; Stool:1] Intake/Output this shift: No intake/output data recorded.   Physical Exam General appearance:alert, cooperative and no distress Neurologic:No focal deficits Heart:stable SR. Lungs:clear to auscultation bilaterally Abdomen:Soft, NT Extremities:RLEincisions are approximated with staples with expected bruising and remain dry. Minimal edema. Wound:Sternalincisionskin edges approximated, expected bruising adjacent to incision.No erythema or drainage.   Lab Results: Recent Labs    08/21/19 0344 08/22/19 0517  WBC 6.8 5.8  HGB 10.9* 11.2*  HCT 33.3* 34.0*  PLT 122* 145*   BMET:  Recent Labs    08/21/19 0344 08/22/19 0517  NA 135 135  K 3.8 3.8  CL 102 103  CO2 22 22  GLUCOSE 105* 106*  BUN 35* 32*  CREATININE 1.54* 1.36*  CALCIUM 8.6* 8.7*    PT/INR: No results for input(s): LABPROT, INR in the last 72 hours. ABG    Component Value Date/Time   PHART 7.365 08/17/2019 1912   HCO3  21.0 08/17/2019 1912   TCO2 22 08/17/2019 1912   ACIDBASEDEF 4.0 (H) 08/17/2019 1912   O2SAT 96.0 08/17/2019 1912   CBG (last 3)  Recent Labs    08/22/19 1615 08/22/19 2145 08/23/19 0619  GLUCAP 147* 103* 119*    Assessment/Plan: S/P Procedure(s) (LRB): CORONARY ARTERY BYPASS GRAFTING (CABG) x 4 using LIMA to LAD(d) and endscopic right greater saphenous vein harvest: SVG to Diag1; SVG to OM1; SVG to PDA. 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  Progressing slowly with ambulation, on RA and maintaining adequate O2 sats. Stable cardiac rhythm. Continue ASA, statin, B. Blocker.Encouraging participation with PT.``  -Hypertension-SBP in the 140's most of yesterday. Will increase metoprolol at 50mg  BID today and continue amlodipine at 10mg  daily.  -Thrombocytopenia-resolved  -CKD, stage 3- Creat 1.36, near baseline. Recheck in AM.    -Mild volume excess-resolved.  -DVT PPX- on daily enoxaparin.   -Disposition-PT now recommending SNF at discharge. Ms. Matzke is in agreement with this. TOC social work has initiated the process. FL2 cosigned. Awaiting bed offers.    LOS: 11 days    Antony Odea, [place] [phone] 08/23/2019   Agree with above Doing well Awaiting snf placement  Airen Stiehl O Trevone [place]wood

## 2019-08-24 DIAGNOSIS — R079 Chest pain, unspecified: Secondary | ICD-10-CM | POA: Diagnosis not present

## 2019-08-24 DIAGNOSIS — R69 Illness, unspecified: Secondary | ICD-10-CM | POA: Diagnosis not present

## 2019-08-24 DIAGNOSIS — R5381 Other malaise: Secondary | ICD-10-CM | POA: Diagnosis not present

## 2019-08-24 DIAGNOSIS — M79661 Pain in right lower leg: Secondary | ICD-10-CM | POA: Diagnosis not present

## 2019-08-24 DIAGNOSIS — J9601 Acute respiratory failure with hypoxia: Secondary | ICD-10-CM | POA: Diagnosis not present

## 2019-08-24 DIAGNOSIS — R509 Fever, unspecified: Secondary | ICD-10-CM | POA: Diagnosis not present

## 2019-08-24 DIAGNOSIS — I69398 Other sequelae of cerebral infarction: Secondary | ICD-10-CM | POA: Diagnosis not present

## 2019-08-24 DIAGNOSIS — D649 Anemia, unspecified: Secondary | ICD-10-CM | POA: Diagnosis not present

## 2019-08-24 DIAGNOSIS — Z7401 Bed confinement status: Secondary | ICD-10-CM | POA: Diagnosis not present

## 2019-08-24 DIAGNOSIS — D72829 Elevated white blood cell count, unspecified: Secondary | ICD-10-CM | POA: Diagnosis not present

## 2019-08-24 DIAGNOSIS — E871 Hypo-osmolality and hyponatremia: Secondary | ICD-10-CM | POA: Diagnosis not present

## 2019-08-24 DIAGNOSIS — Z951 Presence of aortocoronary bypass graft: Secondary | ICD-10-CM | POA: Diagnosis not present

## 2019-08-24 DIAGNOSIS — Z743 Need for continuous supervision: Secondary | ICD-10-CM | POA: Diagnosis not present

## 2019-08-24 DIAGNOSIS — I10 Essential (primary) hypertension: Secondary | ICD-10-CM | POA: Diagnosis not present

## 2019-08-24 DIAGNOSIS — R600 Localized edema: Secondary | ICD-10-CM | POA: Diagnosis not present

## 2019-08-24 DIAGNOSIS — Z48812 Encounter for surgical aftercare following surgery on the circulatory system: Secondary | ICD-10-CM | POA: Diagnosis not present

## 2019-08-24 DIAGNOSIS — I2510 Atherosclerotic heart disease of native coronary artery without angina pectoris: Secondary | ICD-10-CM | POA: Diagnosis not present

## 2019-08-24 DIAGNOSIS — I25110 Atherosclerotic heart disease of native coronary artery with unstable angina pectoris: Secondary | ICD-10-CM | POA: Diagnosis not present

## 2019-08-24 DIAGNOSIS — M2550 Pain in unspecified joint: Secondary | ICD-10-CM | POA: Diagnosis not present

## 2019-08-24 DIAGNOSIS — J438 Other emphysema: Secondary | ICD-10-CM | POA: Diagnosis not present

## 2019-08-24 DIAGNOSIS — E875 Hyperkalemia: Secondary | ICD-10-CM | POA: Diagnosis not present

## 2019-08-24 DIAGNOSIS — I200 Unstable angina: Secondary | ICD-10-CM | POA: Diagnosis not present

## 2019-08-24 DIAGNOSIS — I69328 Other speech and language deficits following cerebral infarction: Secondary | ICD-10-CM | POA: Diagnosis not present

## 2019-08-24 DIAGNOSIS — E782 Mixed hyperlipidemia: Secondary | ICD-10-CM | POA: Diagnosis not present

## 2019-08-24 DIAGNOSIS — M6281 Muscle weakness (generalized): Secondary | ICD-10-CM | POA: Diagnosis not present

## 2019-08-24 DIAGNOSIS — K219 Gastro-esophageal reflux disease without esophagitis: Secondary | ICD-10-CM | POA: Diagnosis not present

## 2019-08-24 DIAGNOSIS — R0602 Shortness of breath: Secondary | ICD-10-CM | POA: Diagnosis not present

## 2019-08-24 DIAGNOSIS — I25700 Atherosclerosis of coronary artery bypass graft(s), unspecified, with unstable angina pectoris: Secondary | ICD-10-CM | POA: Diagnosis not present

## 2019-08-24 DIAGNOSIS — R0789 Other chest pain: Secondary | ICD-10-CM | POA: Diagnosis not present

## 2019-08-24 DIAGNOSIS — R41841 Cognitive communication deficit: Secondary | ICD-10-CM | POA: Diagnosis not present

## 2019-08-24 DIAGNOSIS — R0902 Hypoxemia: Secondary | ICD-10-CM | POA: Diagnosis not present

## 2019-08-24 LAB — BASIC METABOLIC PANEL
Anion gap: 9 (ref 5–15)
BUN: 24 mg/dL — ABNORMAL HIGH (ref 8–23)
CO2: 23 mmol/L (ref 22–32)
Calcium: 8.8 mg/dL — ABNORMAL LOW (ref 8.9–10.3)
Chloride: 102 mmol/L (ref 98–111)
Creatinine, Ser: 1.24 mg/dL — ABNORMAL HIGH (ref 0.44–1.00)
GFR calc Af Amer: 48 mL/min — ABNORMAL LOW (ref >60)
GFR calc non Af Amer: 41 mL/min — ABNORMAL LOW (ref >60)
Glucose, Bld: 127 mg/dL — ABNORMAL HIGH (ref 70–99)
Potassium: 3.7 mmol/L (ref 3.5–5.1)
Sodium: 134 mmol/L — ABNORMAL LOW (ref 135–145)

## 2019-08-24 LAB — GLUCOSE, CAPILLARY
Glucose-Capillary: 136 mg/dL — ABNORMAL HIGH (ref 70–99)
Glucose-Capillary: 148 mg/dL — ABNORMAL HIGH (ref 70–99)
Glucose-Capillary: 129 mg/dL — ABNORMAL HIGH (ref 70–99)

## 2019-08-24 MED ORDER — POTASSIUM CHLORIDE CRYS ER 20 MEQ PO TBCR: 20.0000 meq | EXTENDED_RELEASE_TABLET | Freq: Every day | ORAL | 0 refills | Status: DC

## 2019-08-24 MED ORDER — METOPROLOL TARTRATE 50 MG PO TABS: 50.0000 mg | ORAL_TABLET | Freq: Two times a day (BID) | ORAL | Status: DC

## 2019-08-24 MED ORDER — POTASSIUM CHLORIDE CRYS ER 10 MEQ PO TBCR
30.0000 meq | EXTENDED_RELEASE_TABLET | Freq: Once | ORAL | Status: AC
  Administered 2019-08-24: 30 meq via ORAL

## 2019-08-24 MED ORDER — TRAMADOL HCL 50 MG PO TABS: 50.0000 mg | ORAL_TABLET | Freq: Four times a day (QID) | ORAL | 0 refills | Status: DC | PRN

## 2019-08-24 MED ORDER — POTASSIUM CHLORIDE CRYS ER 20 MEQ PO TBCR
20.0000 meq | EXTENDED_RELEASE_TABLET | Freq: Every day | ORAL | Status: DC
  Administered 2019-08-24: 20 meq via ORAL
  Filled 2019-08-24: qty 1

## 2019-08-24 MED ORDER — AMLODIPINE BESYLATE 10 MG PO TABS: 10.0000 mg | ORAL_TABLET | Freq: Every day | ORAL | Status: DC

## 2019-08-24 MED ORDER — FUROSEMIDE 40 MG PO TABS: 40.0000 mg | ORAL_TABLET | Freq: Every day | ORAL | 0 refills | Status: DC

## 2019-08-24 MED ORDER — FUROSEMIDE 40 MG PO TABS
40.0000 mg | ORAL_TABLET | Freq: Every day | ORAL | Status: DC
  Administered 2019-08-24: 40 mg via ORAL
  Filled 2019-08-24: qty 1

## 2019-08-24 MED ORDER — ASPIRIN 325 MG PO TBEC: 325.0000 mg | DELAYED_RELEASE_TABLET | Freq: Every day | ORAL | 0 refills | Status: DC

## 2019-08-24 MED FILL — Sodium Bicarbonate IV Soln 8.4%: INTRAVENOUS | Qty: 50 | Status: AC

## 2019-08-24 MED FILL — Mannitol IV Soln 20%: INTRAVENOUS | Qty: 500 | Status: AC

## 2019-08-24 MED FILL — Calcium Chloride Inj 10%: INTRAVENOUS | Qty: 10 | Status: AC

## 2019-08-24 MED FILL — Sodium Chloride IV Soln 0.9%: INTRAVENOUS | Qty: 2000 | Status: AC

## 2019-08-24 MED FILL — Electrolyte-R (PH 7.4) Solution: INTRAVENOUS | Qty: 4000 | Status: AC

## 2019-08-24 MED FILL — Albumin, Human Inj 5%: INTRAVENOUS | Qty: 250 | Status: AC

## 2019-08-24 MED FILL — Heparin Sodium (Porcine) Inj 1000 Unit/ML: INTRAMUSCULAR | Qty: 10 | Status: AC

## 2019-08-24 NOTE — Progress Notes (Addendum)
      [address]       [place] [address]             [phone]        7 Days Post-Op Procedure(s) (LRB): CORONARY ARTERY BYPASS GRAFTING (CABG) x 4 using LIMA to LAD(d) and endscopic right greater saphenous vein harvest: SVG to Diag1; SVG to OM1; SVG to PDA. Flowtrack: Only (N/A) TRANSESOPHAGEAL ECHOCARDIOGRAM (TEE) (N/A) ENDOVEIN HARVEST OF GREATER SAPHENOUS VEIN (Right)  Subjective: Patient's only complaint is feeling tired  Objective: Vital signs in last 24 hours: Temp:  [98.1 F (36.7 C)-98.8 F (37.1 C)] 98.7 F (37.1 C) (07/06 0506) Pulse Rate:  [73-88] 80 (07/06 0506) Cardiac Rhythm: Normal sinus rhythm (07/05 2038) Resp:  [18-21] 21 (07/06 0516) BP: (128-150)/(61-85) 150/66 (07/06 0506) SpO2:  [92 %-95 %] 94 % (07/06 0506) Weight:  [74.4 kg] 74.4 kg (07/06 0516)  Pre op weight 75 kg Current Weight  08/24/19 74.4 kg      Intake/Output from previous day: 07/05 0701 - 07/06 0700 In: 360 [P.O.:360] Out: 250 [Urine:250]   Physical Exam:  Cardiovascular: RRR Pulmonary: Clear to auscultation bilaterally Abdomen: Soft, non tender, bowel sounds present. Extremities: Trace bilateral lower extremity edema. Ecchymosis right LE Wounds: Clean and dry.  No erythema or signs of infection.  Lab Results: CBC: Recent Labs    08/22/19 0517  WBC 5.8  HGB 11.2*  HCT 34.0*  PLT 145*   BMET:  Recent Labs    08/22/19 0517 08/24/19 0251  NA 135 134*  K 3.8 3.7  CL 103 102  CO2 22 23  GLUCOSE 106* 127*  BUN 32* 24*  CREATININE 1.36* 1.24*  CALCIUM 8.7* 8.8*    PT/INR:  Lab Results  Component Value Date   INR 1.7 (H) 08/17/2019   INR 1.0 08/16/2019   INR 1.69 (H) 05/22/2015   ABG:  INR: Will add last result for INR, ABG once components are confirmed Will add last 4 CBG results once components are confirmed  Assessment/Plan:  1. CV - SR with HR in the 80's. On Amlodipine 10 mg daily, Lopressor 50 mg daily. As discussed with Dr.  Kipp Brood, will restart Plavix and decrease ec asa once seen in office in 1 week 2.  Pulmonary - On room air. History of COPD. Encourage incentive spirometer. 3.  Expected post op acute blood loss anemia - Last H and H 11.2 and 34. 4. CBGs 141/128/136. Pre op HGA1C 5.5. Stop accu checks and SS. 5. Supplement potassium 6. CKD (stage III)-creatinine decreased this am 1.24 7. Deconditioned-PT;to SNF when bed available. Will ask SNF to remove right leg staples Sunday 07/11  Keionna Kinnaird M ZimmermanPA-C 08/24/2019,7:07 AM

## 2019-08-24 NOTE — TOC Transition Note (Signed)
Transition of Care [facility]) - CM/SW Discharge Note   Patient Details  Name: JOUA BAKE MRN: [id number] Date of Birth: 1940-09-01  Transition of Care [facility]) CM/SW Contact:  Benard Halsted, LCSW Phone Number: 08/24/2019, 3:54 PM   Clinical Narrative:    Patient will DC to: [facility] Anticipated DC date: 08/24/19 Family notified: Spouse, Eddie Dibbles Transport by: Corey Harold   Per MD patient ready for DC to [facility] RN, patient, patient's family, and facility notified of DC. Discharge Summary and FL2 sent to facility. RN to call report prior to discharge [phone]). DC packet on chart. Ambulance transport requested for patient.   CSW will sign off for now as social work intervention is no longer needed. Please consult [place] again if new needs arise.      Final next level of care: Skilled Nursing Facility Barriers to Discharge: Barriers Resolved   Patient Goals and CMS Choice Patient states their goals for this hospitalization and ongoing recovery are:: To be able to go back home CMS Medicare.gov Compare Post Acute Care list provided to:: Patient Choice offered to / list presented to : Patient  Discharge Placement   Existing PASRR number confirmed : 08/24/19          Patient chooses bed at: [facility] Patient to be transferred to facility by: [facility] Name of family member notified: Spouse, Eddie Dibbles Patient and family notified of of transfer: 08/24/19  Discharge Plan and Services                                     Social Determinants of Health (SDOH) Interventions     Readmission Risk Interventions No flowsheet data found.

## 2019-08-24 NOTE — Progress Notes (Signed)
CARDIAC REHAB PHASE I   PRE:  Rate/Rhythm: 72 SR  BP:  Supine: 138/71  Sitting:   Standing:    SaO2: 93%RA  MODE:  Ambulation: 94 ft x2    POST:  Rate/Rhythm: 82 SR  BP:  Supine:   Sitting: 120/72  Standing:    SaO2: 94%RA [id number] Pt feeling better today. [birth year] birthday. Pt walked 94 ft on RA with rolling walker and asst x2. Followed with rollator. Pt sat and rested and then walked 94 ft back to room. To BSC and then to recliner. Encouraged pt to walk and use IS when she goes to SNF. Emotional support given and encouragement.   Graylon Good, RN BSN  08/24/2019 9:24 AM

## 2019-08-24 NOTE — Discharge Instructions (Signed)
We ask the SNF to please do the following: 1. Please obtain vital signs at least one time daily 2.Please weigh the patient daily. If he or she continues to gain weight or develops lower extremity edema, contact the office at [phone]. 3. Ambulate patient at least three times daily and please use sternal precautions.  Discharge Instructions:  1. You may shower, please wash incisions daily with soap and water and keep dry.  If you wish to cover wounds with dressing you may do so but please keep clean and change daily.  No tub baths or swimming until incisions have completely healed.  If your incisions become red or develop any drainage please call our office at [phone]  2. No Driving until cleared by Dr. Abran Duke office and you are no longer using narcotic pain medications  3. Monitor your weight daily.. Please use the same scale and weigh at same time... If you gain 5-10 lbs in 48 hours with associated lower extremity swelling, please contact our office at [phone]  4. Fever of 101.5 for at least 24 hours with no source, please contact our office at [phone]  5. Activity- up as tolerated, please walk at least 3 times per day.  Avoid strenuous activity, no lifting, pushing, or pulling with your arms over 8-10 lbs for a minimum of 6 weeks  6. If any questions or concerns arise, please do not hesitate to contact our office at [phone]

## 2019-08-24 NOTE — Progress Notes (Signed)
Mobility Specialist - Progress Note   08/24/19 1426  Mobility  Activity Ambulated in hall  Level of Assistance Modified independent, requires aide device or extra time  Assistive Device Four wheel walker  Distance Ambulated (ft) 200 ft  Mobility Response Tolerated well  Mobility performed by Mobility specialist  $Mobility charge 1 Mobility    Pre-mobility: 69 HR, 94% SpO2 Post-mobility: 73 HR, 98% SpO2   Reeya Bound Transport planner Phone: [phone]

## 2019-08-24 NOTE — Progress Notes (Signed)
CARDIAC REHAB PHASE I   D/c education completed with pt. Pt educated on importance of site care and monitoring incision daily. Encouraged continued walks, IS use, and sternal precautions. Pt given in-the-tube sheet. Reviewed restrictions and exercise guidelines. Will refer to CRP II [place].  [id number] Rufina Falco, RN BSN 08/24/2019 3:08 PM

## 2019-08-24 NOTE — Progress Notes (Signed)
Patient d.c. via stretcher with care link. Belongings given to daughter  Patient voiced no complaints. Going to [employer]

## 2019-08-24 NOTE — Progress Notes (Signed)
Attempted to give report x2 to [facility].

## 2019-08-24 NOTE — Progress Notes (Signed)
Report given to RN @ [place]

## 2019-08-24 NOTE — TOC Progression Note (Signed)
Transition of Care [facility]) - Progression Note    Patient Details  Name: Nicole Meyer MRN: [id number] Date of Birth: 1941/01/20  [facility]) CM/SW [place], LCSW Phone Number: 08/24/2019, 3:24 PM  Clinical Narrative:    Insurance approval received for SNF: [id number], effective through 08/27/19. [facility] does not need an updated COVID test, per liaison, Tonya. CSW updated patient's spouse; he is completing admissison paperwork now at [facility].    Expected Discharge Plan: Skilled Nursing Facility Barriers to Discharge: Barriers Resolved  Expected Discharge Plan and Services Expected Discharge Plan: [place] arrangements for the past 2 months: Single Family Home Expected Discharge Date: 08/24/19                                     Social Determinants of Health (SDOH) Interventions    Readmission Risk Interventions No flowsheet data found.

## 2019-08-25 ENCOUNTER — Non-Acute Institutional Stay (SKILLED_NURSING_FACILITY): Payer: Medicare PPO | Admitting: Adult Health

## 2019-08-25 ENCOUNTER — Encounter: Payer: Self-pay | Admitting: Adult Health

## 2019-08-25 DIAGNOSIS — I10 Essential (primary) hypertension: Secondary | ICD-10-CM | POA: Diagnosis not present

## 2019-08-25 DIAGNOSIS — D649 Anemia, unspecified: Secondary | ICD-10-CM | POA: Diagnosis not present

## 2019-08-25 DIAGNOSIS — K219 Gastro-esophageal reflux disease without esophagitis: Secondary | ICD-10-CM

## 2019-08-25 DIAGNOSIS — I25110 Atherosclerotic heart disease of native coronary artery with unstable angina pectoris: Secondary | ICD-10-CM

## 2019-08-25 DIAGNOSIS — J438 Other emphysema: Secondary | ICD-10-CM

## 2019-08-25 DIAGNOSIS — Z951 Presence of aortocoronary bypass graft: Secondary | ICD-10-CM | POA: Diagnosis not present

## 2019-08-25 NOTE — Progress Notes (Signed)
Location:  [place] Room Number: 119/A Place of Service:  SNF (31) Provider:  Durenda Age, DNP, FNP-BC  Patient Care Team: [name], MD as PCP - General (Family Medicine) Nahser, Wonda Cheng, MD as PCP - Cardiology (Cardiology) Teena Irani, MD (Inactive) (Gastroenterology) Inda Castle, MD (Inactive) as Consulting Physician (Gastroenterology)  Extended Emergency Contact Information Primary Emergency Contact: Shivon, Hackel Address: [address], [place] [address] Johnnette Litter of [place] Phone: [phone] Mobile Phone: [phone] Relation: Spouse  Code Status:  Full Code  Goals of care: Advanced Directive information Advanced Directives 08/25/2019  Does Patient Have a Medical Advance Directive? Yes  Type of Advance Directive (No Data)  Does patient want to make changes to medical advance directive? No - Patient declined  Would patient like information on creating a medical advance directive? -  Pre-existing out of facility DNR order (yellow form or pink MOST form) -     Chief Complaint  Patient presents with  . Hospitalization Follow-up    Hospitalization Follow Up    HPI:  Pt is a 79 y.o. female who was admitted to [facility] on 08/24/19 post [facility] hospitalization 08/12/2019 to 08/24/2019 for CAD S/P elective CABG x4 on 08/17/2019.  Hospitalization was complicated by hypertension for which she was started on Norvasc for additional control.  She will re-start on Plavix and decrease enteric-coated aspirin once in follow-up with vascular surgery.  She has had several months history of exertional angina and history of cerebrovascular events in January 2020.  Her anginal symptoms have recently worsened over the past few months.  She underwent a left heart catheterization on June/24/21 which showed three-vessel coronary disease.  Cardiothoracic surgery was consulted.  She has had  a stroke in the past but had no significant residual deficits except for lower extremity weakness which may also be attributed to her right knee replacement and left knee pain.  She has been on Plavix up until 08/12/2019 for her stroke.  Pre-operative duplex ultrasound showed no significant internal carotid artery stenosis bilaterally.  She was seen in the room today she complained of pain on her chest surgical site, 7/10.  She is currently taking PRN tramadol.     Past Medical History:  Diagnosis Date  . Anxiety   . Anxiety   . Arthritis    "knees and back" (02/12/2013)  . Arthritis   . Asthma    no problems recently  . Blood dyscrasia    sperocytosis  . Cancer ([place]) 04-06-12   skin cancer nasal bridge-no problems now  . Chronic kidney disease (CKD), stage III (moderate)   . COPD (chronic obstructive pulmonary disease) ([place])   . Coronary artery disease   . Depression   . Depression   . Diverticulitis   . Fatigue   . GERD (gastroesophageal reflux disease)   . Gout 04/05/2013  . H/O hiatal hernia   . History of blood transfusion    "2 units in 12/2012; suppose to get 1 unit today" (02/12/2013)  . HTN (hypertension)   . Hyperbilirubinemia 05/03/2013  . Hypercalcemia   . Hypercholesteremia    "above borderline; I don't take RX for it" (12/26/2011)  . IBS (irritable bowel syndrome)   . IBS (irritable bowel syndrome)    ibs  . Iron deficiency anemia    [facility]  . Leukocytopenia   . Lumbar spinal stenosis   .  Lymphopenia 08/23/2011  . Osteoarthritis   . Osteoporosis   . PMR (polymyalgia rheumatica) (HCC)    pmr  . Polymyalgia ([place])   . Renal insufficiency   . Syncope and collapse 12/24/2011   "loss of consciousness for 3-4 min" (12/26/2011)  . UTI (urinary tract infection) 12/26/2011   Past Surgical History:  Procedure Laterality Date  . APPENDECTOMY  79  . arthroscopic knee surgery-left    . BREAST EXCISIONAL BIOPSY Right 1995  . BREAST  SURGERY  04-06-12   rt. lumpectomy_benign  . CARDIAC CATHETERIZATION  08/12/2019  . carpal tunnel right    . COLONOSCOPY, ESOPHAGOGASTRODUODENOSCOPY (EGD) AND ESOPHAGEAL DILATION     06/2013  . CORONARY ARTERY BYPASS GRAFT N/A 08/17/2019   Procedure: CORONARY ARTERY BYPASS GRAFTING (CABG) x 4 using LIMA to LAD(d) and endscopic right greater saphenous vein harvest: SVG to Diag1; SVG to OM1; SVG to PDA. Flowtrack: Only;  Surgeon: [name], MD;  Location: [place];  Service: Open Heart Surgery;  Laterality: N/A;  . ENDOVEIN HARVEST OF GREATER SAPHENOUS VEIN Right 08/17/2019   Procedure: ENDOVEIN HARVEST OF GREATER SAPHENOUS VEIN;  Surgeon: [name], MD;  Location: [place];  Service: Open Heart Surgery;  Laterality: Right;  . ESOPHAGOGASTRODUODENOSCOPY  12/26/2011   Procedure: ESOPHAGOGASTRODUODENOSCOPY (EGD);  Surgeon: Inda Castle, MD;  Location: [place];  Service: Endoscopy;  Laterality: N/A;  . EYE SURGERY     cataracts  . HERNIA REPAIR  1970's   "stomach"   . LAPAROSCOPIC NISSEN FUNDOPLICATION N/A 0/76/2263   Procedure: Laparoscopic Reduction and Repair of Paraesophagel Hiatal Hernia with Nissen;  Surgeon: Adin Hector, MD;  Location: [facility];  Service: General;  Laterality: N/A;  Laparoscopic Reduction and Repair of Paraesophagel Hiatal Hernia with Nissen  . LEFT HEART CATH AND CORONARY ANGIOGRAPHY N/A 08/12/2019   Procedure: LEFT HEART CATH AND CORONARY ANGIOGRAPHY;  Surgeon: Jettie Booze, MD;  Location: [facility];  Service: Cardiovascular;  Laterality: N/A;  . NISSEN FUNDOPLICATION     04/3543  . TEE WITHOUT CARDIOVERSION N/A 08/17/2019   Procedure: TRANSESOPHAGEAL ECHOCARDIOGRAM (TEE);  Surgeon: [name], MD;  Location: [employer];  Service: Open Heart Surgery;  Laterality: N/A;  . TOTAL KNEE ARTHROPLASTY Right 05/19/2015  . TOTAL KNEE ARTHROPLASTY Right 05/19/2015   Procedure: RIGHT TOTAL KNEE ARTHROPLASTY;  Surgeon: Netta Cedars, MD;   Location: [facility];  Service: Orthopedics;  Laterality: Right;  . TUBAL LIGATION  1970's  . VAGINAL HYSTERECTOMY  ~ 1980   right ovary    Allergies  Allergen Reactions  . Penicillins Hives    Last reaction was at 34 Has patient had a PCN reaction causing immediate rash, facial/tongue/throat swelling, SOB or lightheadedness with hypotension: Yes Has patient had a PCN reaction causing severe rash involving mucus membranes or skin necrosis: No Has patient had a PCN reaction that required hospitalization No Has patient had a PCN reaction occurring within the last 10 years: No If all of the above answers are "NO", then may proceed with Cephalosporin use.   Garlon Hatchet [Arformoterol] Other (See Comments)    "makes me nervous"  . Paxil [Paroxetine Hcl] Rash    Outpatient Encounter Medications as of 08/25/2019  Medication Sig  . albuterol (PROVENTIL HFA;VENTOLIN HFA) 108 (90 Base) MCG/ACT inhaler Inhale 2 puffs into the lungs every 6 (six) hours as needed for wheezing or shortness of breath.  [name] amLODipine (NORVASC) 10 MG tablet Take 1 tablet (10 mg total) by mouth  daily.  . aspirin EC 325 MG EC tablet Take 1 tablet (325 mg total) by mouth daily.  . bisacodyl (DULCOLAX) 10 MG suppository If not relieved by MOM, give 10 mg Bisacodyl suppositiory rectally X 1 dose in 24 hours as needed (Do not use constipation standing orders for residents with renal failure/CFR less than 30. Contact MD for orders) (Physician Order)  . Calcium Carb-Cholecalciferol (CALCIUM 600 + D PO) Take 2 tablets by mouth daily after breakfast.  . cyanocobalamin 500 MCG tablet Take 500 mcg by mouth daily after breakfast. Vitamin B12  . Ferrous Sulfate (IRON PO) Take 65 mg by mouth every Monday, Wednesday, and Friday.   . folic acid (FOLVITE) 235 MCG tablet Take 400 mcg by mouth daily after breakfast.   . furosemide (LASIX) 40 MG tablet Take 1 tablet (40 mg total) by mouth daily. For 5 days then stop.  . magnesium hydroxide (MILK OF  MAGNESIA) 400 MG/5ML suspension If no BM in 3 days, give 30 cc Milk of Magnesium p.o. x 1 dose in 24 hours as needed (Do not use standing constipation orders for residents with renal failure CFR less than 30. Contact MD for orders) (Physician Order)  . Menthol, Topical Analgesic, (ICY HOT) 7.5 % (Roll) MISC Apply 1 each topically daily as needed (arthritis pain).  . metoprolol tartrate (LOPRESSOR) 50 MG tablet Take 1 tablet (50 mg total) by mouth 2 (two) times daily.  . Multiple Vitamins-Minerals (OCUVITE PO) Take 1 tablet by mouth daily after breakfast.  . NON FORMULARY Heart healthy diet  . omeprazole (PRILOSEC) 20 MG capsule Take 1 capsule (20 mg total) by mouth daily.  . potassium chloride SA (KLOR-CON) 20 MEQ tablet Take 1 tablet (20 mEq total) by mouth daily. For 5 days then stop.  . predniSONE (DELTASONE) 5 MG tablet Take 1 tablet (5 mg total) by mouth daily after breakfast.  . rosuvastatin (CRESTOR) 20 MG tablet Take 1 tablet (20 mg total) by mouth daily.  . Sodium Phosphates (RA SALINE ENEMA RE) If not relieved by Biscodyl suppository, give disposable Saline Enema rectally X 1 dose/24 hrs as needed (Do not use constipation standing orders for residents with renal failure/CFR less than 30. Contact MD for orders)(Physician Or  . traMADol (ULTRAM) 50 MG tablet Take 1 tablet (50 mg total) by mouth every 6 (six) hours as needed for moderate pain.   No facility-administered encounter medications on file as of 08/25/2019.    Review of Systems  GENERAL: Poor appetite, no fever, chills or weakness MOUTH and THROAT: Denies oral discomfort, gingival pain or bleeding RESPIRATORY: no cough, SOB, DOE, wheezing, hemoptysis CARDIAC: No chest pain, edema or palpitations GI: No abdominal pain, diarrhea, constipation, heart burn, nausea or vomiting GU: Denies dysuria, frequency, hematuria, incontinence, or discharge NEUROLOGICAL: Denies dizziness, syncope, numbness, or headache PSYCHIATRIC: Denies  feelings of depression or anxiety. No report of hallucinations, insomnia, paranoia, or agitation   Immunization History  Administered Date(s) Administered  . Influenza,inj,Quad PF,6+ Mos 11/28/2014  . Influenza-Unspecified 11/26/2011, 11/18/2012, 12/03/2013, 11/28/2014   Pertinent  Health Maintenance Due  Topic Date Due  . PNA vac Low Risk Adult (1 of 2 - PCV13) Never done  . INFLUENZA VACCINE  09/19/2019  . DEXA SCAN  Completed   Fall Risk  01/03/2014  Falls in the past year? No     Vitals:   08/25/19 0839  BP: 137/84  Pulse: 74  Resp: 18  Temp: (!) 97.1 F (36.2 C)  TempSrc: Oral  SpO2:  93%  Weight: 164 lb 3.2 oz (74.5 kg)  Height: 5' 1.5" (1.562 m)   Body mass index is 30.52 kg/m.  Physical Exam  GENERAL APPEARANCE: Well nourished. In no acute distress. Obese SKIN:  Chest surgical site dry and with bruising; right leg with 4 surgical wound with staples, dry and no erythema MOUTH and THROAT: Lips are without lesions. Oral mucosa is moist and without lesions. Tongue is normal in shape, size, and color and without lesions RESPIRATORY: Breathing is even & unlabored, BS CTAB CARDIAC: RRR, no murmur,no extra heart sounds GI: Abdomen soft, normal BS, no masses, no tenderness NEUROLOGICAL: There is no tremor. Speech is clear. Alert and oriented X 3. PSYCHIATRIC:  Affect and behavior are appropriate  Labs reviewed: Recent Labs    08/17/19 1912 08/17/19 1912 08/18/19 0410 08/18/19 0410 08/18/19 1625 08/19/19 0406 08/21/19 0344 08/22/19 0517 08/24/19 0251  NA 139  141   < > 136   < > 137   < > 135 135 134*  K 4.3  4.0   < > 4.3   < > 5.0   < > 3.8 3.8 3.7  CL 108   < > 107   < > 107   < > 102 103 102  CO2 21*   < > 21*   < > 24   < > 22 22 23   GLUCOSE 164*   < > 152*   < > 121*   < > 105* 106* 127*  BUN 18   < > 17   < > 20   < > 35* 32* 24*  CREATININE 1.02*   < > 1.04*   < > 1.37*   < > 1.54* 1.36* 1.24*  CALCIUM 8.3*   < > 8.2*   < > 8.4*   < > 8.6* 8.7*  8.8*  MG 3.4*  --  2.5*  --  2.5*  --   --   --   --    < > = values in this interval not displayed.   Recent Labs    08/25/18 1213 08/16/19 0930  AST 15 16  ALT 11 14  ALKPHOS 83 54  BILITOT 0.7 1.2  PROT 7.2 6.6  ALBUMIN 3.9 3.4*   Recent Labs    08/25/18 1213 08/10/19 1501 08/19/19 0406 08/21/19 0344 08/22/19 0517  WBC 6.5   < > 9.3 6.8 5.8  NEUTROABS 5.4  --   --   --   --   HGB 12.8   < > 12.0 10.9* 11.2*  HCT 38.6   < > 36.8 33.3* 34.0*  MCV 94.6   < > 94.6 94.3 94.7  PLT 193   < > 86* 122* 145*   < > = values in this interval not displayed.   Lab Results  Component Value Date   TSH 0.783 08/04/2015   Lab Results  Component Value Date   HGBA1C 5.5 08/17/2019   Lab Results  Component Value Date   CHOL 101 08/14/2019   HDL 43 08/14/2019   LDLCALC 31 08/14/2019   TRIG 135 08/14/2019   CHOLHDL 2.3 08/14/2019    Significant Diagnostic Results in last 30 days:  DG Chest 2 View  Result Date: 08/22/2019 CLINICAL DATA:  79 year old female under postoperative evaluation. EXAM: CHEST - 2 VIEW COMPARISON:  Chest x-ray 08/19/2019. FINDINGS: Lung volumes are normal. Atelectasis and/or consolidation in the left lower lobe. Small bilateral pleural effusions. No evidence of pulmonary edema. No pneumothorax. Heart  size is normal. Upper mediastinal contours are within normal limits. Aortic atherosclerosis. Status post CABG. IMPRESSION: 1. Persistent atelectasis and/or consolidation in the left lower lobe. 2. Small bilateral pleural effusions. 3. Aortic atherosclerosis. Electronically Signed   By: Vinnie Langton [name]   On: 08/22/2019 08:04   DG Chest 2 View  Result Date: 08/16/2019 CLINICAL DATA:  Preop testing for surgery tomorrow. EXAM: CHEST - 2 VIEW COMPARISON:  02/27/2018 FINDINGS: The heart size and mediastinal contours are within normal limits. Both lungs are clear. The visualized skeletal structures are unremarkable. IMPRESSION: No active cardiopulmonary disease.  Electronically Signed   By: Kathreen Devoid   On: 08/16/2019 09:13   CARDIAC CATHETERIZATION  Result Date: 08/12/2019  Prox Cx lesion is 70% stenosed.  Mid LAD lesion is 75% stenosed.  2nd Diag lesion is 95% stenosed.  Prox RCA lesion is 80% stenosed.  Mid RCA lesion is 80% stenosed.  LV end diastolic pressure is normal.  There is no aortic valve stenosis.  Three vessel CAD. Cardiac surgery consult.  Plan to admit since she is having unstable/ accelerating symptoms.  Will need to consider adding IV heparin if radial site is ok, and as plavix wears off.  Will check echo and hydrate post procedure.   DG Chest Port 1 View  Result Date: 08/19/2019 CLINICAL DATA:  CABG.  History pneumothorax. EXAM: PORTABLE CHEST 1 VIEW COMPARISON:  08/18/2019. FINDINGS: Interim removal of right IJ line and mediastinal drainage catheter. Prior CABG. Cardiomegaly. No pulmonary venous congestion. Left base atelectasis/infiltrate again noted. Small left pleural effusion again noted. No pneumothorax identified. IMPRESSION: 1. Interim removal of right IJ line and mediastinal drainage catheter. No pneumothorax. 2. Prior CABG. Stable cardiomegaly. No pulmonary venous congestion. 3. Persistent left base atelectasis/infiltrate small left pleural effusion. Similar findings noted on prior exam. Electronically Signed   By: [place]   On: 08/19/2019 07:06   DG Chest Port 1 View  Result Date: 08/18/2019 CLINICAL DATA:  Bypass surgery. EXAM: PORTABLE CHEST 1 VIEW COMPARISON:  08/17/2019 FINDINGS: The a tracheal tube and NG tubes have been removed. The right IJ catheter is stable. Mediastinal drain tubes are stable. Persistent left basilar atelectasis and probable small left effusion. The right lung remains clear. No pneumothorax is identified. IMPRESSION: 1. Removal of ET and NG tubes. 2. Persistent left basilar atelectasis and probable small left effusion. Electronically Signed   By: Marijo Sanes [name]   On: 08/18/2019 09:04    DG Chest Port 1 View  Result Date: 08/17/2019 CLINICAL DATA:  Status post coronary bypass grafting EXAM: PORTABLE CHEST 1 VIEW COMPARISON:  08/16/2019 FINDINGS: Postsurgical changes are now seen. Endotracheal tube, gastric catheter and right jugular line are seen. Gastric catheter extends to just above the gastroesophageal junction and could be advanced several cm deeper to reach the stomach. A mediastinal drain and left thoracostomy tube are seen as well. Lungs are well aerated bilaterally. Patchy atelectatic changes in the left base are seen. No pneumothorax is noted. IMPRESSION: Tubes and lines as described above. The gastric catheter could be advanced several cm to reach the stomach. Left basilar airspace opacity likely related atelectasis. Electronically Signed   By: Inez Catalina [name]   On: 08/17/2019 13:24   VAS [place] LOWER EXTREMITY SAPHENOUS VEIN MAPPING  Result Date: 08/17/2019 LOWER EXTREMITY VEIN MAPPING Indications:  Pre-op Risk Factors: Coronary artery disease.  Comparison Study: No prior study on file Performing Technologist: Sharion Dove RVS  Examination Guidelines: A complete evaluation includes B-mode imaging,  spectral Doppler, color Doppler, and power Doppler as needed of all accessible portions of each vessel. Bilateral testing is considered an integral part of a complete examination. Limited examinations for reoccurring indications may be performed as noted. +---------------+-----------+----------------------+---------------+-----------+   RT Diameter  RT Findings         GSV            LT Diameter  LT Findings      (cm)                                            (cm)                  +---------------+-----------+----------------------+---------------+-----------+      0.46                     Saphenofemoral         0.61                                                   Junction                                   +---------------+-----------+----------------------+---------------+-----------+      0.42                     Proximal thigh         0.43                  +---------------+-----------+----------------------+---------------+-----------+      0.37       branching       Mid thigh            0.52                  +---------------+-----------+----------------------+---------------+-----------+      0.24       branching      Distal thigh          0.44                  +---------------+-----------+----------------------+---------------+-----------+      0.30       branching          Knee                                    +---------------+-----------+----------------------+---------------+-----------+      0.31                       Prox calf            0.34                  +---------------+-----------+----------------------+---------------+-----------+      0.26                        Mid calf            0.26       branching  +---------------+-----------+----------------------+---------------+-----------+      0.27  Distal calf           0.24                  +---------------+-----------+----------------------+---------------+-----------+      0.20       branching         Ankle              0.13                  +---------------+-----------+----------------------+---------------+-----------+ Diagnosing physician: Deitra Mayo MD Electronically signed by Deitra Mayo MD on 08/17/2019 at 8:53:59 AM.    Final    ECHOCARDIOGRAM COMPLETE  Result Date: 08/12/2019    ECHOCARDIOGRAM REPORT   Patient Name:   [name] Date of Exam: 08/12/2019 Medical Rec #:  [id number]         Height:       61.5 in Accession #:    [id number]        Weight:       171.0 lb Date of Birth:  [birth year]          BSA:          1.778 m Patient Age:    36 years          BP:           136/55 mmHg Patient Gender: F                 HR:           51 bpm. Exam Location:   Inpatient Procedure: 2D Echo Indications:    CAD 414. 01  History:        Patient has prior history of Echocardiogram examinations, most                 recent 02/28/2018. CAD, COPD; Risk Factors:Hypertension,                 Dyslipidemia and Former Smoker.  Sonographer:    Jannett Celestine RDCS (AE) Referring Phys: [place]  1. Left ventricular ejection fraction, by estimation, is 60 to 65%. The left ventricle has normal function. The left ventricle has no regional wall motion abnormalities. There is mild left ventricular hypertrophy. Left ventricular diastolic parameters are consistent with Grade I diastolic dysfunction (impaired relaxation).  2. Right ventricular systolic function is normal. The right ventricular size is normal.  3. The mitral valve is abnormal. Moderate mitral annular calcification. No evidence of mitral valve regurgitation.  4. The aortic valve was not well visualized. Aortic valve regurgitation is not visualized. No aortic stenosis is present.  5. The inferior vena cava is normal in size with greater than 50% respiratory variability, suggesting right atrial pressure of 3 mmHg. FINDINGS  Left Ventricle: Left ventricular ejection fraction, by estimation, is 60 to 65%. The left ventricle has normal function. The left ventricle has no regional wall motion abnormalities. The left ventricular internal cavity size was normal in size. There is  mild left ventricular hypertrophy. Left ventricular diastolic parameters are consistent with Grade I diastolic dysfunction (impaired relaxation). Right Ventricle: The right ventricular size is normal. Right vetricular wall thickness was not assessed. Right ventricular systolic function is normal. Left Atrium: Left atrial size was normal in size. Right Atrium: Right atrial size was normal in size. Pericardium: There is no evidence of pericardial effusion. Mitral Valve: The mitral valve is abnormal. Moderate mitral annular calcification.  No evidence of mitral valve regurgitation. Tricuspid Valve: The  tricuspid valve is normal in structure. Tricuspid valve regurgitation is trivial. Aortic Valve: The aortic valve was not well visualized. Aortic valve regurgitation is not visualized. No aortic stenosis is present. Pulmonic Valve: The pulmonic valve was not well visualized. Pulmonic valve regurgitation is not visualized. Aorta: The aortic root is normal in size and structure. Venous: The inferior vena cava is normal in size with greater than 50% respiratory variability, suggesting right atrial pressure of 3 mmHg. IAS/Shunts: The interatrial septum was not well visualized.  LEFT VENTRICLE PLAX 2D LVIDd:         4.50 cm  Diastology LVIDs:         3.00 cm  LV e' lateral:   8.38 cm/s LV PW:         1.00 cm  LV E/e' lateral: 9.2 LV IVS:        1.00 cm  LV e' medial:    6.20 cm/s LVOT diam:     2.00 cm  LV E/e' medial:  12.4 LV SV:         57 LV SV Index:   32 LVOT Area:     3.14 cm  RIGHT VENTRICLE TAPSE (M-mode): 2.0 cm LEFT ATRIUM             Index       RIGHT ATRIUM           Index LA diam:        4.20 cm 2.36 cm/m  RA Area:     12.50 cm LA Vol (A2C):   64.2 ml 36.11 ml/m RA Volume:   23.20 ml  13.05 ml/m LA Vol (A4C):   33.2 ml 18.67 ml/m LA Biplane Vol: 51.1 ml 28.74 ml/m  AORTIC VALVE LVOT Vmax:   89.70 cm/s LVOT Vmean:  62.300 cm/s LVOT VTI:    0.183 m  AORTA Ao Root diam: 3.30 cm MITRAL VALVE MV Area (PHT): 2.07 cm     SHUNTS MV Decel Time: 366 msec     Systemic VTI:  0.18 m MV E velocity: 77.10 cm/s   Systemic Diam: 2.00 cm MV A velocity: 112.00 cm/s MV E/A ratio:  0.69 Oswaldo Milian MD Electronically signed by Oswaldo Milian MD Signature Date/Time: 08/12/2019/8:03:37 PM    Final    ECHO INTRAOPERATIVE TEE  Result Date: 08/17/2019  *INTRAOPERATIVE TRANSESOPHAGEAL REPORT *  Patient Name:   [name] Date of Exam: 08/17/2019 Medical Rec #:  [id number]         Height:       61.5 in Accession #:    [id number]        Weight:        161.1 lb Date of Birth:  April 15, 1940          BSA:          1.73 m Patient Age:    73 years          BP:           171/66 mmHg Patient Gender: F                 HR:           59 bpm. Exam Location:  Anesthesiology Transesophogeal exam was perform intraoperatively during surgical procedure. Patient was closely monitored under general anesthesia during the entirety of examination. Indications:     CAD, CABG Sonographer:     Dustin Flock RDCS Performing Phys: [id number] Lucile Crater LIGHTFOOT Diagnosing Phys: Annye Asa MD Complications: No known  complications during this procedure. POST-OP IMPRESSIONS Limited Post- CPB exam: - Left Ventricle: The left ventricular function is unchanged from pre-bypass images. Contractility remains normal, without regional motion abnormalities. Overall EF 50-60%. - Aortic Valve: The aortic valve appears unchanged from pre-bypass. - Mitral Valve: The mitral valve appears unchanged from pre-bypass. There is mild MR. - Tricuspid Valve: The tricuspid valve appears unchanged from pre-bypass. PRE-OP FINDINGS  Left Ventricle: The left ventricle has hyperdynamic systolic function, with an ejection fraction of >65%, calculated 65.6%. The cavity size was normal. There is no increase in left ventricular wall thickness. No evidence of left ventricular regional wall motion abnormalities. Right Ventricle: The right ventricle has normal systolic function. The cavity was normal. There is no increase in right ventricular wall thickness. Right ventricular systolic pressure is normal. Left Atrium: Left atrial size was normal in size. The left atrial appendage is well visualized and there is no evidence of thrombus present. Left atrial appendage velocity is normal at greater than 40 cm/s. Right Atrium: Right atrial size was normal in size. Interatrial Septum: No atrial level shunt detected by color flow Doppler. Pericardium: There is no evidence of pericardial effusion. Mitral Valve: The mitral  valve is normal in structure. Mild thickening of the mitral valve leaflet. Mild calcification of the mitral valve leaflet. Mitral valve regurgitation is trivial by color flow Doppler. There is no evidence of mitral valve vegetation. Pulmonary venous flow is normal. There is no evidence of mitral stenosis, with peak gradient 5 mmHg, mean gradient 1 mmHg. Tricuspid Valve: The tricuspid valve was normal in structure. Tricuspid valve regurgitation is trivial by color flow Doppler. There is no evidence of tricuspid valve vegetation. Aortic Valve: The aortic valve is tricuspid. Aortic valve regurgitation was not visualized by color flow Doppler. There is no evidence of aortic valve stenosis, with peak gradient 8 mmHg. There is no evidence of a vegetation on the aortic valve. Pulmonic Valve: The pulmonic valve was normal in structure, with normal leaflet excursion. No evidence of pulmonic stenosis. Pulmonic valve regurgitation is trivial by color flow Doppler. Aorta: The aortic root, ascending aorta and aortic arch are normal in size and structure. Very little atherosclerosis is visualized. Pulmonary Artery: The pulmonary artery is of normal size. Venous: The inferior vena cava was not well visualized, but appears normal in size. +-------------+--------++ MITRAL VALVE          +-------------+--------++ MV Mean grad:1.0 mmHg +-------------+--------++  Annye Asa MD Electronically signed by Annye Asa MD Signature Date/Time: 08/17/2019/5:47:48 PM    Final    Doppler Pre CABG  Result Date: 08/17/2019 PREOPERATIVE VASCULAR EVALUATION  Risk Factors:     Hypertension, hyperlipidemia, coronary artery disease, prior                   CVA. Comparison Study: Prior carotid study from 02/28/18 is available for comparison.                   No significant changes noted. Performing Technologist: Sharion Dove RVS  Examination Guidelines: A complete evaluation includes B-mode imaging, spectral Doppler, color  Doppler, and power Doppler as needed of all accessible portions of each vessel. Bilateral testing is considered an integral part of a complete examination. Limited examinations for reoccurring indications may be performed as noted.  Right Carotid Findings: +----------+--------+--------+--------+------------+--------+           PSV cm/sEDV cm/sStenosisDescribe    Comments +----------+--------+--------+--------+------------+--------+ CCA Prox  92      14                                   +----------+--------+--------+--------+------------+--------+  CCA Distal81      15                                   +----------+--------+--------+--------+------------+--------+ ICA Prox  105     23              heterogenous         +----------+--------+--------+--------+------------+--------+ ICA Distal96      19                                   +----------+--------+--------+--------+------------+--------+ ECA       112     11                                   +----------+--------+--------+--------+------------+--------+ Portions of this table do not appear on this page. +----------+--------+-------+--------+------------+           PSV cm/sEDV cmsDescribeArm Pressure +----------+--------+-------+--------+------------+ Subclavian103                                 +----------+--------+-------+--------+------------+ +---------+--------+--+--------+--+ VertebralPSV cm/s44EDV cm/s10 +---------+--------+--+--------+--+ Left Carotid Findings: +----------+--------+--------+--------+------------+--------+           PSV cm/sEDV cm/sStenosisDescribe    Comments +----------+--------+--------+--------+------------+--------+ CCA Prox  111     18                                   +----------+--------+--------+--------+------------+--------+ CCA Distal91      20                                   +----------+--------+--------+--------+------------+--------+ ICA Prox   90      17              heterogenous         +----------+--------+--------+--------+------------+--------+ ECA       217     0                                    +----------+--------+--------+--------+------------+--------+ +----------+--------+--------+--------+------------+ SubclavianPSV cm/sEDV cm/sDescribeArm Pressure +----------+--------+--------+--------+------------+           123                                  +----------+--------+--------+--------+------------+ +---------+--------+--+--------+--+ VertebralPSV cm/s53EDV cm/s14 +---------+--------+--+--------+--+  ABI Findings: +--------+------------------+-----+---------+--------+ Right   Rt Pressure (mmHg)IndexWaveform Comment  +--------+------------------+-----+---------+--------+ [id number]                    triphasic         +--------+------------------+-----+---------+--------+ ATA                            triphasic         +--------+------------------+-----+---------+--------+ PTA                            triphasic         +--------+------------------+-----+---------+--------+ +--------+------------------+-----+---------+-------+ Left  Lt Pressure (mmHg)IndexWaveform Comment +--------+------------------+-----+---------+-------+ Brachial                       triphasic        +--------+------------------+-----+---------+-------+ ATA                            triphasic        +--------+------------------+-----+---------+-------+ PTA                            triphasic        +--------+------------------+-----+---------+-------+  Right Doppler Findings: +--------+--------+-----+---------+--------+ Site    PressureIndexDoppler  Comments +--------+--------+-----+---------+--------+ [id number]          triphasic         +--------+--------+-----+---------+--------+ Radial               triphasic         +--------+--------+-----+---------+--------+  Ulnar                triphasic         +--------+--------+-----+---------+--------+  Left Doppler Findings: +-----------+--------+-----+---------+-----------------------------------------+ Site       PressureIndexDoppler  Comments                                  +-----------+--------+-----+---------+-----------------------------------------+ Brachial                triphasic                                          +-----------+--------+-----+---------+-----------------------------------------+ Radial                  triphasic                                          +-----------+--------+-----+---------+-----------------------------------------+ Ulnar                   triphasic                                          +-----------+--------+-----+---------+-----------------------------------------+ Palmar Arch                      Doppler signal remains normal with radial                                  compression and reverses with ulnar                                        compression                               +-----------+--------+-----+---------+-----------------------------------------+  Summary: Right Carotid: The extracranial vessels were near-normal with only minimal wall                thickening or plaque. Left Carotid: The extracranial  vessels were near-normal with only minimal wall               thickening or plaque. Vertebrals:  Bilateral vertebral arteries demonstrate antegrade flow. Subclavians: Normal flow hemodynamics were seen in bilateral subclavian              arteries. Right Upper Extremity: Doppler waveforms decrease <50% with right radial compression. Doppler waveforms remain within normal limits with right ulnar compression. Left Upper Extremity: Doppler waveforms remain within normal limits with left radial compression. Doppler waveforms remain within normal limits with left ulnar compression.  Electronically signed by Deitra Mayo MD on 08/17/2019 at 8:53:34 AM.    Final     Assessment/Plan  1. Atherosclerosis of native coronary artery of native heart with unstable angina pectoris (HCC) S/P CABG x 4 -Continue PRN tramadol for pain, aspirin EC 325 mg daily and Crestor 20 mg 1 tab daily - Follow-up with cardiothoracic surgery, Dr. Kipp Brood, on 08/27/19 -  Follow-up with cardiology on 09/06/2019 -  Sternal precautions and weight daily  2. Essential hypertension -BP 137/84, continue Norvasc and Lopressor   3. Chronic anemia Lab Results  Component Value Date   WBC 5.8 08/22/2019   HGB 11.2 (L) 08/22/2019   HCT 34.0 (L) 08/22/2019   MCV 94.7 08/22/2019   PLT 145 (L) 08/22/2019   -  Continue ferrous sulfate 325 mg 1 tab daily on MWF  4. Other emphysema ([place]) -  No SOB/wheezing, continue PRN albuterol and prednisone  5.  GERD -  Continue Prilosec     Family/ staff Communication: Discussed plan of care with resident and charge nurse.  Labs/tests ordered:  CBC, CMP on 08/26/19  Goals of care: Short-term care   Durenda Age, DNP, FNP-BC [facility] [phone] (Monday-Friday 8:00 a.m. - 5:00 p.m.) [phone] (after hours)

## 2019-08-26 ENCOUNTER — Telehealth (HOSPITAL_COMMUNITY): Payer: Self-pay

## 2019-08-26 ENCOUNTER — Encounter: Payer: Self-pay | Admitting: Internal Medicine

## 2019-08-26 ENCOUNTER — Non-Acute Institutional Stay (SKILLED_NURSING_FACILITY): Payer: Medicare PPO | Admitting: Internal Medicine

## 2019-08-26 DIAGNOSIS — I25110 Atherosclerotic heart disease of native coronary artery with unstable angina pectoris: Secondary | ICD-10-CM | POA: Diagnosis not present

## 2019-08-26 DIAGNOSIS — R509 Fever, unspecified: Secondary | ICD-10-CM

## 2019-08-26 DIAGNOSIS — I10 Essential (primary) hypertension: Secondary | ICD-10-CM | POA: Diagnosis not present

## 2019-08-26 NOTE — Patient Instructions (Signed)
See assessment and plan under each diagnosis in the problem list and acutely for this visit 

## 2019-08-26 NOTE — Telephone Encounter (Signed)
Pt insurance is active and benefits verified through [place] $20, DED 0/0 met, out of pocket $4,000/$140 met, co-insurance 0%. no pre-authorization required. Passport, 08/26/2019@44 :12am, REF# [id number]  Will contact patient to see if she is interested in the Cardiac Rehab Program. If interested, patient will need to complete follow up appt. Once completed, patient will be contacted for scheduling upon review by the RN Navigator.

## 2019-08-26 NOTE — Progress Notes (Signed)
NURSING HOME LOCATION:  [facility] ROOM NUMBER:  [id number]  CODE STATUS:  FULL CODE  PCP:  [name], MD  [address] [place] [address]   This is a comprehensive admission note to [facility] performed on this date less than 30 days from date of admission. Included are preadmission medical/surgical history; reconciled medication list; family history; social history and comprehensive review of systems.  Corrections and additions to the records were documented. Comprehensive physical exam was also performed. Additionally a clinical summary was entered for each active diagnosis pertinent to this admission in the Problem List to enhance continuity of care.  HPI: The patient was hospitalized 6/24-08/24/2019 presenting for elective left heart catheterization. There was a history of several month of exertional angina as well as history of cerebrovascular event in January 2020. Cath was performed 6/24 revealing three-vessel CAD. At the time of catheterization she had been on Plavix because of the history of stroke, but her PT Y 12 was normal. CABG x4 was performed 6/29. Postop she was extubated the evening of surgery without difficulty or complication. Diuretics were initiated for hypervolemia. Amlodipine was added for hypertension.  PT/OT recommended SNF placement for rehab.  It was planned to continue aspirin until CVTS follow-up as an outpatient with reinitiation of Plavix at that time. CVTS requested staple removal on Sunday 7/11.  Past medical and surgical history: Includes history of syncope, history of polymyalgia rheumatica (on low-dose prednisone), osteoporosis, DJD, lumbar spinal stenosis, iron deficiency anemia, IBS, dyslipidemia, essential hypertension, GERD, history of gout, history of anxiety/depression, CAD, COPD, CKD, and history of asthma. Surgery seizures include breast biopsy/lumpectomy, esophageal dilation, CABG, right TKA, and vaginal  hysterectomy.  Social history: Nondrinker, former smoker with 40-pack-year history.  Family history: History reviewed; noncontributory due to age.   Review of systems: Her chief complaint is profound fatigue.  She denies any bleeding dyscrasias.  She also describes chronic pain in the right knee on which she had a TKA.  She had planned surgery on the left knee for DJD but is deferring because of her experience following the first surgery.  She also has some soreness at the donor sites in the right lower extremity.  She has intermittent postop chest pain without anginal equivalent.  She also has edema; she denies any associated cardiopulmonary symptoms.  She has chronic weakness and both legs since her stroke in 2020. Despite the temp of 99.3; she denies any infectious symptoms.  Constitutional: No fever, chills,significant weight change Eyes: No redness, discharge, pain, vision change ENT/mouth: No nasal congestion, purulent discharge, earache, change in hearing, sore throat  Cardiovascular: No palpitations, paroxysmal nocturnal dyspnea, claudication Respiratory: No cough, sputum production, hemoptysis,  significant snoring, apnea  Gastrointestinal: No heartburn, dysphagia, abdominal pain, nausea /vomiting, rectal bleeding, melena, change in bowels Genitourinary: No dysuria, hematuria, pyuria, incontinence, nocturia Dermatologic: No rash, pruritus, change in appearance of skin Neurologic: No dizziness, headache, syncope, seizures, numbness, tingling Psychiatric: No significant anxiety, depression, insomnia, anorexia Endocrine: No change in hair/skin/nails, excessive thirst, excessive hunger, excessive urination  Hematologic/lymphatic: No significant bruising, lymphadenopathy, abnormal bleeding Allergy/immunology: No itchy/watery eyes, significant sneezing, urticaria, angioedema  Physical exam:  Pertinent or positive findings: She appears her stated age.  She has complete dentures.  Heart rate  is slow and regular.  Breath sounds are decreased with insignificant bibasilar rales.  Pedal pulses are decreased.  She has 1/2+ edema at the ankle/sock line.  She has marked PIP and DIP joint changes of  the hands.  There is decreased range of motion of the right knee and crepitus in the left knee.  She has bruising at the donor site in the right lower extremity.  There is irregular bruising over the forearms.  General appearance: Adequately nourished; no acute distress, increased work of breathing is present.   Lymphatic: No lymphadenopathy about the head, neck, axilla. Eyes: No conjunctival inflammation or lid edema is present. There is no scleral icterus. Ears:  External ear exam shows no significant lesions or deformities.   Nose:  External nasal examination shows no deformity or inflammation. Nasal mucosa are pink and moist without lesions, exudates Oral exam: Lips and gums are healthy appearing.There is no oropharyngeal erythema or exudate. Neck:  No thyromegaly, masses, tenderness noted.    Heart:  No gallop, murmur, click, rub.  Lungs:  without wheezes, rhonchi, rubs. Abdomen: Bowel sounds are normal.  Abdomen is soft and nontender with no organomegaly, hernias, masses. GU: Deferred  Extremities:  No cyanosis, clubbing. Neurologic exam:  Strength equal  in upper & lower extremities. Balance, Rhomberg, finger to nose testing could not be completed due to clinical state Deep tendon reflexes are equal in UE Skin: Warm & dry w/o tenting. No significant lesions or rash.  See clinical summary under each active problem in the Problem List with associated updated therapeutic plan

## 2019-08-26 NOTE — Assessment & Plan Note (Addendum)
Staple removal 08/29/2019 at SNF CVTS follow-up with plans for reinitiation of Plavix @ that visit

## 2019-08-26 NOTE — Assessment & Plan Note (Signed)
BP controlled; no change in antihypertensive medications  

## 2019-08-27 ENCOUNTER — Encounter: Payer: Self-pay | Admitting: Adult Health

## 2019-08-27 ENCOUNTER — Non-Acute Institutional Stay (SKILLED_NURSING_FACILITY): Payer: Medicare PPO | Admitting: Adult Health

## 2019-08-27 ENCOUNTER — Ambulatory Visit (INDEPENDENT_AMBULATORY_CARE_PROVIDER_SITE_OTHER): Payer: Self-pay | Admitting: Thoracic Surgery (Cardiothoracic Vascular Surgery)

## 2019-08-27 ENCOUNTER — Encounter: Payer: Self-pay | Admitting: Thoracic Surgery (Cardiothoracic Vascular Surgery)

## 2019-08-27 ENCOUNTER — Other Ambulatory Visit: Payer: Self-pay

## 2019-08-27 VITALS — BP 114/74 | HR 75 | Resp 20 | Ht 61.5 in | Wt 159.0 lb

## 2019-08-27 DIAGNOSIS — E875 Hyperkalemia: Secondary | ICD-10-CM

## 2019-08-27 DIAGNOSIS — Z951 Presence of aortocoronary bypass graft: Secondary | ICD-10-CM

## 2019-08-27 DIAGNOSIS — I2510 Atherosclerotic heart disease of native coronary artery without angina pectoris: Secondary | ICD-10-CM

## 2019-08-27 DIAGNOSIS — I25110 Atherosclerotic heart disease of native coronary artery with unstable angina pectoris: Secondary | ICD-10-CM | POA: Diagnosis not present

## 2019-08-27 LAB — BASIC METABOLIC PANEL
BUN: 24 — AB (ref 4–21)
CO2: 20 (ref 13–22)
Chloride: 97 — AB (ref 99–108)
Creatinine: 1.1 (ref 0.5–1.1)
Glucose: 114
Potassium: 6.1 — AB (ref 3.4–5.3)
Sodium: 130 — AB (ref 137–147)

## 2019-08-27 LAB — COMPREHENSIVE METABOLIC PANEL
Calcium: 9.2 (ref 8.7–10.7)
GFR calc Af Amer: 52.76
GFR calc non Af Amer: 45.52

## 2019-08-27 LAB — CBC AND DIFFERENTIAL
HCT: 35 — AB (ref 36–46)
Hemoglobin: 11.8 — AB (ref 12.0–16.0)
Neutrophils Absolute: 6
Platelets: 313 (ref 150–399)
WBC: 7.1

## 2019-08-27 LAB — LIPID PANEL
Cholesterol: 91 (ref 0–200)
HDL: 27 — AB (ref 35–70)
LDL Cholesterol: 33
LDl/HDL Ratio: 3.4
Triglycerides: 154 (ref 40–160)

## 2019-08-27 LAB — CBC: RBC: 3.76 — AB (ref 3.87–5.11)

## 2019-08-27 NOTE — Progress Notes (Signed)
      [address]       [place] [address]             [phone]        Brigitt A Rathje [place] Medical Record #[id number] Date of Birth: [birth year]  Referring: Nahser, Wonda Cheng, MD Primary Care: [name], MD Primary Cardiologist:Philip Nahser, MD  Reason for visit:   follow-up  History of Present Illness:     [name] comes in for her 1 week follow-up appointment.  Overall she is doing well.  She is in a nursing home and states that she wants to be off of the tramadol because it makes her too tired.  She is working occasionally with physical therapy, but is still quite deconditioned.  Physical Exam: BP 114/74   Pulse 75   Resp 20   Ht 5' 1.5" (1.562 m)   Wt 159 lb (72.1 kg)   SpO2 (!) 88% Comment: RA  BMI 29.56 kg/m   Alert NAD Incision clean.  Sternum stable Abdomen soft, ND No peripheral edema.  Staples removed   Assessment / Plan:   79 year old female status post CABG.  Currently doing well. Continue physical therapy at her nursing home. We will follow up in 1 month   [name] 08/27/2019 12:44 PM

## 2019-08-27 NOTE — Progress Notes (Signed)
Location:  [facility] Room Number: 119-A Place of Service:  SNF (31) Provider:  Durenda Age, DNP, FNP-BC  Patient Care Team: [name], MD as PCP - General (Family Medicine) Nahser, Wonda Cheng, MD as PCP - Cardiology (Cardiology) Teena Irani, MD (Inactive) (Gastroenterology) Inda Castle, MD (Inactive) as Consulting Physician (Gastroenterology)  Extended Emergency Contact Information Primary Emergency Contact: Angelly, Spearing Address: [address], [place] [address] Johnnette Litter of [place] Phone: [phone] Mobile Phone: [phone] Relation: Spouse  Code Status:  FULL CODE   Goals of care: Advanced Directive information Advanced Directives 08/25/2019  Does Patient Have a Medical Advance Directive? Yes  Type of Advance Directive (No Data)  Does patient want to make changes to medical advance directive? No - Patient declined  Would patient like information on creating a medical advance directive? -  Pre-existing out of facility DNR order (yellow form or pink MOST form) -     Chief Complaint  Patient presents with   Acute Visit    Patient is seen for increased potassium of 6.1    HPI:  Pt is a 79 y.o. female seen today for elevated K 6.1.  Resident was seen in her room.  She verbalized feeling tired. She stated that she feels so sleepy when she takes the PRN Tramadol, she feels so sleepy.  She is a short-term care resident of [facility].  She has a PMH of syncope, polymyalgia, rheumatica, osteoporosis, DJD, lumbar spinal stenosis,, IBS, dyslipidemia, essential hypertension, GERD, anxiety/depression, CAD, COPD, CKD and asthma.  She was admitted to [facility] on 08/24/2019 post [facility] hospitalization 08/12/19 to 08/24/19 for CAD S/P elective CABG x4 on 08/17/2019.   Past Medical History:  Diagnosis Date   Anxiety    Arthritis    "knees and back" (02/12/2013)     Arthritis    Asthma    no problems recently   Blood dyscrasia    sperocytosis   Cancer ([place]) 04-06-12   skin cancer nasal bridge-no problems now   Chronic kidney disease (CKD), stage III (moderate)    COPD (chronic obstructive pulmonary disease) (HCC)    Coronary artery disease    Depression    Diverticulitis    Fatigue    GERD (gastroesophageal reflux disease)    Gout 04/05/2013   H/O hiatal hernia    History of blood transfusion    "2 units in 12/2012; suppose to get 1 unit today" (02/12/2013)   HTN (hypertension)    Hyperbilirubinemia 05/03/2013   Hypercalcemia    Hypercholesteremia    "above borderline; I don't take RX for it" (12/26/2011)   IBS (irritable bowel syndrome)    ibs   Iron deficiency anemia    [facility]   Leukocytopenia    Lumbar spinal stenosis    Lymphopenia 08/23/2011   Osteoarthritis    Osteoporosis    PMR (polymyalgia rheumatica) (HCC)    pmr   Polymyalgia ([place])    Renal insufficiency    Syncope and collapse 12/24/2011   "loss of consciousness for 3-4 min" (12/26/2011)   UTI (urinary tract infection) 12/26/2011   Past Surgical History:  Procedure Laterality Date   APPENDECTOMY  79   arthroscopic knee surgery-left     BREAST EXCISIONAL BIOPSY Right 1995   BREAST SURGERY  04-06-12   rt. lumpectomy_benign   CARDIAC CATHETERIZATION  08/12/2019   carpal  tunnel right     COLONOSCOPY, ESOPHAGOGASTRODUODENOSCOPY (EGD) AND ESOPHAGEAL DILATION     06/2013   CORONARY ARTERY BYPASS GRAFT N/A 08/17/2019   Procedure: CORONARY ARTERY BYPASS GRAFTING (CABG) x 4 using LIMA to LAD(d) and endscopic right greater saphenous vein harvest: SVG to Diag1; SVG to OM1; SVG to PDA. Flowtrack: Only;  Surgeon: [name], MD;  Location: [facility];  Service: Open Heart Surgery;  Laterality: N/A;   ENDOVEIN HARVEST OF GREATER SAPHENOUS VEIN Right 08/17/2019   Procedure: ENDOVEIN HARVEST OF GREATER SAPHENOUS  VEIN;  Surgeon: [name], MD;  Location: [facility];  Service: Open Heart Surgery;  Laterality: Right;   ESOPHAGOGASTRODUODENOSCOPY  12/26/2011   Procedure: ESOPHAGOGASTRODUODENOSCOPY (EGD);  Surgeon: Inda Castle, MD;  Location: [facility];  Service: Endoscopy;  Laterality: N/A;   EYE SURGERY     cataracts   HERNIA REPAIR  1970's   "stomach"    LAPAROSCOPIC NISSEN FUNDOPLICATION N/A 2/35/5732   Procedure: Laparoscopic Reduction and Repair of Paraesophagel Hiatal Hernia with Nissen;  Surgeon: Adin Hector, MD;  Location: [facility];  Service: General;  Laterality: N/A;  Laparoscopic Reduction and Repair of Paraesophagel Hiatal Hernia with Nissen   LEFT HEART CATH AND CORONARY ANGIOGRAPHY N/A 08/12/2019   Procedure: LEFT HEART CATH AND CORONARY ANGIOGRAPHY;  Surgeon: Jettie Booze, MD;  Location: [facility];  Service: Cardiovascular;  Laterality: N/A;   NISSEN FUNDOPLICATION     03/252   TEE WITHOUT CARDIOVERSION N/A 08/17/2019   Procedure: TRANSESOPHAGEAL ECHOCARDIOGRAM (TEE);  Surgeon: [name], MD;  Location: [facility];  Service: Open Heart Surgery;  Laterality: N/A;   TOTAL KNEE ARTHROPLASTY Right 05/19/2015   Procedure: RIGHT TOTAL KNEE ARTHROPLASTY;  Surgeon: Netta Cedars, MD;  Location: [facility];  Service: Orthopedics;  Laterality: Right;   TUBAL LIGATION  1970's   VAGINAL HYSTERECTOMY  ~ 1980   right ovary    Allergies  Allergen Reactions   Penicillins Hives    Last reaction was at 34 Has patient had a PCN reaction causing immediate rash, facial/tongue/throat swelling, SOB or lightheadedness with hypotension: Yes Has patient had a PCN reaction causing severe rash involving mucus membranes or skin necrosis: No Has patient had a PCN reaction that required hospitalization No Has patient had a PCN reaction occurring within the last 10 years: No If all of the above answers are "NO", then may proceed with Cephalosporin use.    Brovana  [Arformoterol] Other (See Comments)    "makes me nervous"   Paxil [Paroxetine Hcl] Rash    Outpatient Encounter Medications as of 08/27/2019  Medication Sig   albuterol (PROVENTIL HFA;VENTOLIN HFA) 108 (90 Base) MCG/ACT inhaler Inhale 2 puffs into the lungs every 6 (six) hours as needed for wheezing or shortness of breath.   amLODipine (NORVASC) 10 MG tablet Take 1 tablet (10 mg total) by mouth daily.   aspirin EC 325 MG EC tablet Take 1 tablet (325 mg total) by mouth daily.   bisacodyl (DULCOLAX) 10 MG suppository If not relieved by MOM, give 10 mg Bisacodyl suppositiory rectally X 1 dose in 24 hours as needed (Do not use constipation standing orders for residents with renal failure/CFR less than 30. Contact MD for orders) (Physician Order)   Calcium Carbonate-Vitamin D3 (CALCIUM 600/VITAMIN D) 600-400 MG-UNIT TABS Take 2 tablets by mouth daily.   cyanocobalamin 500 MCG tablet Take 500 mcg by mouth daily after breakfast. Vitamin B12   ferrous sulfate 325 (65 FE) MG tablet  Take 325 mg by mouth every Monday, Wednesday, and Friday.   folic acid (FOLVITE) 767 MCG tablet Take 400 mcg by mouth daily after breakfast.    furosemide (LASIX) 40 MG tablet Take 1 tablet (40 mg total) by mouth daily. For 5 days then stop.   magnesium hydroxide (MILK OF MAGNESIA) 400 MG/5ML suspension If no BM in 3 days, give 30 cc Milk of Magnesium p.o. x 1 dose in 24 hours as needed (Do not use standing constipation orders for residents with renal failure CFR less than 30. Contact MD for orders) (Physician Order)   Menthol, Topical Analgesic, (ICY HOT) 7.5 % (Roll) MISC Apply 1 each topically daily as needed (arthritis pain).   metoprolol tartrate (LOPRESSOR) 50 MG tablet Take 1 tablet (50 mg total) by mouth 2 (two) times daily.   Multiple Vitamins-Minerals (PRESERVISION AREDS) TABS Take 1 tablet by mouth daily.   NON FORMULARY Heart healthy diet   omeprazole (PRILOSEC) 20 MG capsule Take 1 capsule (20 mg  total) by mouth daily.   potassium chloride SA (KLOR-CON) 20 MEQ tablet Take 1 tablet (20 mEq total) by mouth daily. For 5 days then stop.   predniSONE (DELTASONE) 5 MG tablet Take 1 tablet (5 mg total) by mouth daily after breakfast.   rosuvastatin (CRESTOR) 20 MG tablet Take 1 tablet (20 mg total) by mouth daily.   Sodium Phosphates (RA SALINE ENEMA RE) If not relieved by Biscodyl suppository, give disposable Saline Enema rectally X 1 dose/24 hrs as needed (Do not use constipation standing orders for residents with renal failure/CFR less than 30. Contact MD for orders)(Physician Or   traMADol (ULTRAM) 50 MG tablet Take 1 tablet (50 mg total) by mouth every 6 (six) hours as needed for moderate pain.   [DISCONTINUED] Calcium Carb-Cholecalciferol (CALCIUM 600 + D PO) Take 2 tablets by mouth daily after breakfast.   [DISCONTINUED] Ferrous Sulfate (IRON PO) Take 65 mg by mouth every Monday, Wednesday, and Friday.    [DISCONTINUED] Multiple Vitamins-Minerals (OCUVITE PO) Take 1 tablet by mouth daily after breakfast.   No facility-administered encounter medications on file as of 08/27/2019.    Review of Systems  GENERAL: No change in appetite, no fatigue, no weight changes, no fever, chills or weakness MOUTH and THROAT: Denies oral discomfort, gingival pain or bleeding RESPIRATORY: no cough, SOB, DOE, wheezing, hemoptysis CARDIAC: No chest pain or palpitations GI: No abdominal pain, diarrhea, constipation, heart burn, nausea or vomiting GU: Denies dysuria, frequency, hematuria, incontinence, or discharge NEUROLOGICAL: Denies dizziness, syncope, numbness, or headache PSYCHIATRIC: Denies feelings of depression or anxiety. No report of hallucinations, insomnia, paranoia, or agitation   Immunization History  Administered Date(s) Administered   Influenza,inj,Quad PF,6+ Mos 11/28/2014   Influenza-Unspecified 11/26/2011, 11/18/2012, 12/03/2013, 11/28/2014   Moderna SARS-COVID-2 Vaccination  07/27/2019   Pertinent  Health Maintenance Due  Topic Date Due   PNA vac Low Risk Adult (1 of 2 - PCV13) Never done   INFLUENZA VACCINE  09/19/2019   DEXA SCAN  Completed   Fall Risk  01/03/2014  Falls in the past year? No     Vitals:   08/27/19 1522  BP: 131/83  Pulse: 66  Resp: 16  Temp: 99.3 F (37.4 C)  TempSrc: Oral  SpO2: 93%  Weight: 159 lb 3.2 oz (72.2 kg)  Height: 5\' 2"  (1.575 m)   Body mass index is 29.12 kg/m.  Physical Exam  GENERAL APPEARANCE: Well nourished. In no acute distress. Obese SKIN: Surgical site with scabs, dry, with  bruising; or surgical incisions on right leg is dry, no erythema, staples have been removed MOUTH and THROAT: Lips are without lesions. Oral mucosa is moist and without lesions. Tongue is normal in shape, size, and color and without lesions RESPIRATORY: Breathing is even & unlabored, BS CTAB CARDIAC: RRR, no murmur,no extra heart sounds, no edema GI: Abdomen soft, normal BS, no masses, no tenderness EXTREMITIES: Able to move x4 extremities NEUROLOGICAL: There is no tremor. Speech is clear. Alert and oriented X 3. PSYCHIATRIC:  Affect and behavior are appropriate  Labs reviewed: Recent Labs    08/17/19 1912 08/17/19 1912 08/18/19 0410 08/18/19 0410 08/18/19 1625 08/19/19 0406 08/21/19 0344 08/21/19 0344 08/22/19 0517 08/24/19 0251 08/27/19 0000  NA 139   141   < > 136   < > 137   < > 135   < > 135 134* 130*  K 4.3   4.0   < > 4.3   < > 5.0   < > 3.8   < > 3.8 3.7 6.1*  CL 108   < > 107   < > 107   < > 102   < > 103 102 97*  CO2 21*   < > 21*   < > 24   < > 22   < > 22 23 20   GLUCOSE 164*   < > 152*   < > 121*   < > 105*  --  106* 127*  --   BUN 18   < > 17   < > 20   < > 35*   < > 32* 24* 24*  CREATININE 1.02*   < > 1.04*   < > 1.37*   < > 1.54*   < > 1.36* 1.24* 1.1  CALCIUM 8.3*   < > 8.2*   < > 8.4*   < > 8.6*   < > 8.7* 8.8* 9.2  MG 3.4*  --  2.5*  --  2.5*  --   --   --   --   --   --    < > = values in this  interval not displayed.   Recent Labs    08/16/19 0930  AST 16  ALT 14  ALKPHOS 54  BILITOT 1.2  PROT 6.6  ALBUMIN 3.4*   Recent Labs    08/19/19 0406 08/19/19 0406 08/21/19 0344 08/22/19 0517 08/27/19 0000  WBC 9.3   < > 6.8 5.8 7.1  NEUTROABS  --   --   --   --  6  HGB 12.0   < > 10.9* 11.2* 11.8*  HCT 36.8   < > 33.3* 34.0* 35*  MCV 94.6  --  94.3 94.7  --   PLT 86*   < > 122* 145* 313   < > = values in this interval not displayed.   Lab Results  Component Value Date   TSH 0.783 08/04/2015   Lab Results  Component Value Date   HGBA1C 5.5 08/17/2019   Lab Results  Component Value Date   CHOL 91 08/27/2019   HDL 27 (A) 08/27/2019   LDLCALC 33 08/27/2019   TRIG 154 08/27/2019   CHOLHDL 2.3 08/14/2019    Significant Diagnostic Results in last 30 days:  DG Chest 2 View  Result Date: 08/22/2019 CLINICAL DATA:  79 year old female under postoperative evaluation. EXAM: CHEST - 2 VIEW COMPARISON:  Chest x-ray 08/19/2019. FINDINGS: Lung volumes are normal. Atelectasis and/or consolidation in the left lower  lobe. Small bilateral pleural effusions. No evidence of pulmonary edema. No pneumothorax. Heart size is normal. Upper mediastinal contours are within normal limits. Aortic atherosclerosis. Status post CABG. IMPRESSION: 1. Persistent atelectasis and/or consolidation in the left lower lobe. 2. Small bilateral pleural effusions. 3. Aortic atherosclerosis. Electronically Signed   By: Vinnie Langton [name]   On: 08/22/2019 08:04   DG Chest 2 View  Result Date: 08/16/2019 CLINICAL DATA:  Preop testing for surgery tomorrow. EXAM: CHEST - 2 VIEW COMPARISON:  02/27/2018 FINDINGS: The heart size and mediastinal contours are within normal limits. Both lungs are clear. The visualized skeletal structures are unremarkable. IMPRESSION: No active cardiopulmonary disease. Electronically Signed   By: Kathreen Devoid   On: 08/16/2019 09:13   CARDIAC CATHETERIZATION  Result Date:  08/12/2019  Prox Cx lesion is 70% stenosed.  Mid LAD lesion is 75% stenosed.  2nd Diag lesion is 95% stenosed.  Prox RCA lesion is 80% stenosed.  Mid RCA lesion is 80% stenosed.  LV end diastolic pressure is normal.  There is no aortic valve stenosis.  Three vessel CAD. Cardiac surgery consult.  Plan to admit since she is having unstable/ accelerating symptoms.  Will need to consider adding IV heparin if radial site is ok, and as plavix wears off.  Will check echo and hydrate post procedure.   DG Chest Port 1 View  Result Date: 08/19/2019 CLINICAL DATA:  CABG.  History pneumothorax. EXAM: PORTABLE CHEST 1 VIEW COMPARISON:  08/18/2019. FINDINGS: Interim removal of right IJ line and mediastinal drainage catheter. Prior CABG. Cardiomegaly. No pulmonary venous congestion. Left base atelectasis/infiltrate again noted. Small left pleural effusion again noted. No pneumothorax identified. IMPRESSION: 1. Interim removal of right IJ line and mediastinal drainage catheter. No pneumothorax. 2. Prior CABG. Stable cardiomegaly. No pulmonary venous congestion. 3. Persistent left base atelectasis/infiltrate small left pleural effusion. Similar findings noted on prior exam. Electronically Signed   By: [place]   On: 08/19/2019 07:06   DG Chest Port 1 View  Result Date: 08/18/2019 CLINICAL DATA:  Bypass surgery. EXAM: PORTABLE CHEST 1 VIEW COMPARISON:  08/17/2019 FINDINGS: The a tracheal tube and NG tubes have been removed. The right IJ catheter is stable. Mediastinal drain tubes are stable. Persistent left basilar atelectasis and probable small left effusion. The right lung remains clear. No pneumothorax is identified. IMPRESSION: 1. Removal of ET and NG tubes. 2. Persistent left basilar atelectasis and probable small left effusion. Electronically Signed   By: Marijo Sanes [name]   On: 08/18/2019 09:04   DG Chest Port 1 View  Result Date: 08/17/2019 CLINICAL DATA:  Status post coronary bypass grafting EXAM:  PORTABLE CHEST 1 VIEW COMPARISON:  08/16/2019 FINDINGS: Postsurgical changes are now seen. Endotracheal tube, gastric catheter and right jugular line are seen. Gastric catheter extends to just above the gastroesophageal junction and could be advanced several cm deeper to reach the stomach. A mediastinal drain and left thoracostomy tube are seen as well. Lungs are well aerated bilaterally. Patchy atelectatic changes in the left base are seen. No pneumothorax is noted. IMPRESSION: Tubes and lines as described above. The gastric catheter could be advanced several cm to reach the stomach. Left basilar airspace opacity likely related atelectasis. Electronically Signed   By: Inez Catalina [name]   On: 08/17/2019 13:24   VAS [place] LOWER EXTREMITY SAPHENOUS VEIN MAPPING  Result Date: 08/17/2019 LOWER EXTREMITY VEIN MAPPING Indications:  Pre-op Risk Factors: Coronary artery disease.  Comparison Study: No prior study on file Performing  Technologist: Sharion Dove RVS  Examination Guidelines: A complete evaluation includes B-mode imaging, spectral Doppler, color Doppler, and power Doppler as needed of all accessible portions of each vessel. Bilateral testing is considered an integral part of a complete examination. Limited examinations for reoccurring indications may be performed as noted. +---------------+-----------+----------------------+---------------+-----------+    RT Diameter   RT Findings          GSV             LT Diameter   LT Findings        (cm)                                               (cm)                    +---------------+-----------+----------------------+---------------+-----------+       0.46                       Saphenofemoral          0.61                                                        Junction                                     +---------------+-----------+----------------------+---------------+-----------+       0.42                       Proximal thigh          0.43                     +---------------+-----------+----------------------+---------------+-----------+       0.37        branching        Mid thigh             0.52                    +---------------+-----------+----------------------+---------------+-----------+       0.24        branching       Distal thigh           0.44                    +---------------+-----------+----------------------+---------------+-----------+       0.30        branching           Knee                                       +---------------+-----------+----------------------+---------------+-----------+       0.31                         Prox calf             0.34                    +---------------+-----------+----------------------+---------------+-----------+  0.26                          Mid calf             0.26        branching   +---------------+-----------+----------------------+---------------+-----------+       0.27                        Distal calf            0.24                    +---------------+-----------+----------------------+---------------+-----------+       0.20        branching          Ankle               0.13                    +---------------+-----------+----------------------+---------------+-----------+ Diagnosing physician: Deitra Mayo MD Electronically signed by Deitra Mayo MD on 08/17/2019 at 8:53:59 AM.    Final    ECHOCARDIOGRAM COMPLETE  Result Date: 08/12/2019    ECHOCARDIOGRAM REPORT   Patient Name:   [name] Date of Exam: 08/12/2019 Medical Rec #:  [id number]         Height:       61.5 in Accession #:    [id number]        Weight:       171.0 lb Date of Birth:  09/12/1940          BSA:          1.778 m Patient Age:    21 years          BP:           136/55 mmHg Patient Gender: F                 HR:           51 bpm. Exam Location:  Inpatient Procedure: 2D Echo Indications:    CAD 414. 01  History:        Patient has prior history of Echocardiogram examinations, most                 recent  02/28/2018. CAD, COPD; Risk Factors:Hypertension,                 Dyslipidemia and Former Smoker.  Sonographer:    Jannett Celestine RDCS (AE) Referring Phys: [place]  1. Left ventricular ejection fraction, by estimation, is 60 to 65%. The left ventricle has normal function. The left ventricle has no regional wall motion abnormalities. There is mild left ventricular hypertrophy. Left ventricular diastolic parameters are consistent with Grade I diastolic dysfunction (impaired relaxation).  2. Right ventricular systolic function is normal. The right ventricular size is normal.  3. The mitral valve is abnormal. Moderate mitral annular calcification. No evidence of mitral valve regurgitation.  4. The aortic valve was not well visualized. Aortic valve regurgitation is not visualized. No aortic stenosis is present.  5. The inferior vena cava is normal in size with greater than 50% respiratory variability, suggesting right atrial pressure of 3 mmHg. FINDINGS  Left Ventricle: Left ventricular ejection fraction, by estimation, is 60 to 65%. The left ventricle has normal function. The left ventricle has no regional wall motion abnormalities. The left ventricular internal cavity size was  normal in size. There is  mild left ventricular hypertrophy. Left ventricular diastolic parameters are consistent with Grade I diastolic dysfunction (impaired relaxation). Right Ventricle: The right ventricular size is normal. Right vetricular wall thickness was not assessed. Right ventricular systolic function is normal. Left Atrium: Left atrial size was normal in size. Right Atrium: Right atrial size was normal in size. Pericardium: There is no evidence of pericardial effusion. Mitral Valve: The mitral valve is abnormal. Moderate mitral annular calcification. No evidence of mitral valve regurgitation. Tricuspid Valve: The tricuspid valve is normal in structure. Tricuspid valve regurgitation is trivial. Aortic Valve:  The aortic valve was not well visualized. Aortic valve regurgitation is not visualized. No aortic stenosis is present. Pulmonic Valve: The pulmonic valve was not well visualized. Pulmonic valve regurgitation is not visualized. Aorta: The aortic root is normal in size and structure. Venous: The inferior vena cava is normal in size with greater than 50% respiratory variability, suggesting right atrial pressure of 3 mmHg. IAS/Shunts: The interatrial septum was not well visualized.  LEFT VENTRICLE PLAX 2D LVIDd:         4.50 cm  Diastology LVIDs:         3.00 cm  LV e' lateral:   8.38 cm/s LV PW:         1.00 cm  LV E/e' lateral: 9.2 LV IVS:        1.00 cm  LV e' medial:    6.20 cm/s LVOT diam:     2.00 cm  LV E/e' medial:  12.4 LV SV:         57 LV SV Index:   32 LVOT Area:     3.14 cm  RIGHT VENTRICLE TAPSE (M-mode): 2.0 cm LEFT ATRIUM             Index       RIGHT ATRIUM           Index LA diam:        4.20 cm 2.36 cm/m  RA Area:     12.50 cm LA Vol (A2C):   64.2 ml 36.11 ml/m RA Volume:   23.20 ml  13.05 ml/m LA Vol (A4C):   33.2 ml 18.67 ml/m LA Biplane Vol: 51.1 ml 28.74 ml/m  AORTIC VALVE LVOT Vmax:   89.70 cm/s LVOT Vmean:  62.300 cm/s LVOT VTI:    0.183 m  AORTA Ao Root diam: 3.30 cm MITRAL VALVE MV Area (PHT): 2.07 cm     SHUNTS MV Decel Time: 366 msec     Systemic VTI:  0.18 m MV E velocity: 77.10 cm/s   Systemic Diam: 2.00 cm MV A velocity: 112.00 cm/s MV E/A ratio:  0.69 Oswaldo Milian MD Electronically signed by Oswaldo Milian MD Signature Date/Time: 08/12/2019/8:03:37 PM    Final    ECHO INTRAOPERATIVE TEE  Result Date: 08/17/2019  *INTRAOPERATIVE TRANSESOPHAGEAL REPORT *  Patient Name:   [name] Date of Exam: 08/17/2019 Medical Rec #:  [id number]         Height:       61.5 in Accession #:    [id number]        Weight:       161.1 lb Date of Birth:  03/22/1940          BSA:          1.73 m Patient Age:    36 years          BP:  171/66 mmHg Patient Gender: F                  HR:           59 bpm. Exam Location:  Anesthesiology Transesophogeal exam was perform intraoperatively during surgical procedure. Patient was closely monitored under general anesthesia during the entirety of examination. Indications:     CAD, CABG Sonographer:     Dustin Flock RDCS Performing Phys: [id number] Lucile Crater LIGHTFOOT Diagnosing Phys: Annye Asa MD Complications: No known complications during this procedure. POST-OP IMPRESSIONS Limited Post- CPB exam: - Left Ventricle: The left ventricular function is unchanged from pre-bypass images. Contractility remains normal, without regional motion abnormalities. Overall EF 50-60%. - Aortic Valve: The aortic valve appears unchanged from pre-bypass. - Mitral Valve: The mitral valve appears unchanged from pre-bypass. There is mild MR. - Tricuspid Valve: The tricuspid valve appears unchanged from pre-bypass. PRE-OP FINDINGS  Left Ventricle: The left ventricle has hyperdynamic systolic function, with an ejection fraction of >65%, calculated 65.6%. The cavity size was normal. There is no increase in left ventricular wall thickness. No evidence of left ventricular regional wall motion abnormalities. Right Ventricle: The right ventricle has normal systolic function. The cavity was normal. There is no increase in right ventricular wall thickness. Right ventricular systolic pressure is normal. Left Atrium: Left atrial size was normal in size. The left atrial appendage is well visualized and there is no evidence of thrombus present. Left atrial appendage velocity is normal at greater than 40 cm/s. Right Atrium: Right atrial size was normal in size. Interatrial Septum: No atrial level shunt detected by color flow Doppler. Pericardium: There is no evidence of pericardial effusion. Mitral Valve: The mitral valve is normal in structure. Mild thickening of the mitral valve leaflet. Mild calcification of the mitral valve leaflet. Mitral valve regurgitation is trivial  by color flow Doppler. There is no evidence of mitral valve vegetation. Pulmonary venous flow is normal. There is no evidence of mitral stenosis, with peak gradient 5 mmHg, mean gradient 1 mmHg. Tricuspid Valve: The tricuspid valve was normal in structure. Tricuspid valve regurgitation is trivial by color flow Doppler. There is no evidence of tricuspid valve vegetation. Aortic Valve: The aortic valve is tricuspid. Aortic valve regurgitation was not visualized by color flow Doppler. There is no evidence of aortic valve stenosis, with peak gradient 8 mmHg. There is no evidence of a vegetation on the aortic valve. Pulmonic Valve: The pulmonic valve was normal in structure, with normal leaflet excursion. No evidence of pulmonic stenosis. Pulmonic valve regurgitation is trivial by color flow Doppler. Aorta: The aortic root, ascending aorta and aortic arch are normal in size and structure. Very little atherosclerosis is visualized. Pulmonary Artery: The pulmonary artery is of normal size. Venous: The inferior vena cava was not well visualized, but appears normal in size. +-------------+--------++  MITRAL VALVE             +-------------+--------++  MV Mean grad: 1.0 mmHg   +-------------+--------++  Annye Asa MD Electronically signed by Annye Asa MD Signature Date/Time: 08/17/2019/5:47:48 PM    Final    Doppler Pre CABG  Result Date: 08/17/2019 PREOPERATIVE VASCULAR EVALUATION  Risk Factors:     Hypertension, hyperlipidemia, coronary artery disease, prior                   CVA. Comparison Study: Prior carotid study from 02/28/18 is available for comparison.  No significant changes noted. Performing Technologist: Sharion Dove RVS  Examination Guidelines: A complete evaluation includes B-mode imaging, spectral Doppler, color Doppler, and power Doppler as needed of all accessible portions of each vessel. Bilateral testing is considered an integral part of a complete examination. Limited  examinations for reoccurring indications may be performed as noted.  Right Carotid Findings: +----------+--------+--------+--------+------------+--------+             PSV cm/s EDV cm/s Stenosis Describe     Comments  +----------+--------+--------+--------+------------+--------+  CCA Prox   92       14                                       +----------+--------+--------+--------+------------+--------+  CCA Distal 81       15                                       +----------+--------+--------+--------+------------+--------+  ICA Prox   105      23                heterogenous           +----------+--------+--------+--------+------------+--------+  ICA Distal 96       19                                       +----------+--------+--------+--------+------------+--------+  ECA        112      11                                       +----------+--------+--------+--------+------------+--------+ Portions of this table do not appear on this page. +----------+--------+-------+--------+------------+             PSV cm/s EDV cms Describe Arm Pressure  +----------+--------+-------+--------+------------+  Subclavian 103                                     +----------+--------+-------+--------+------------+ +---------+--------+--+--------+--+  Vertebral PSV cm/s 44 EDV cm/s 10  +---------+--------+--+--------+--+ Left Carotid Findings: +----------+--------+--------+--------+------------+--------+             PSV cm/s EDV cm/s Stenosis Describe     Comments  +----------+--------+--------+--------+------------+--------+  CCA Prox   111      18                                       +----------+--------+--------+--------+------------+--------+  CCA Distal 91       20                                       +----------+--------+--------+--------+------------+--------+  ICA Prox   90       17                heterogenous           +----------+--------+--------+--------+------------+--------+  ECA        217  0                                         +----------+--------+--------+--------+------------+--------+ +----------+--------+--------+--------+------------+  Subclavian PSV cm/s EDV cm/s Describe Arm Pressure  +----------+--------+--------+--------+------------+             123                                      +----------+--------+--------+--------+------------+ +---------+--------+--+--------+--+  Vertebral PSV cm/s 53 EDV cm/s 14  +---------+--------+--+--------+--+  ABI Findings: +--------+------------------+-----+---------+--------+  Right    Rt Pressure (mmHg) Index Waveform  Comment   +--------+------------------+-----+---------+--------+  Brachial 177                      triphasic           +--------+------------------+-----+---------+--------+  ATA                               triphasic           +--------+------------------+-----+---------+--------+  PTA                               triphasic           +--------+------------------+-----+---------+--------+ +--------+------------------+-----+---------+-------+  Left     Lt Pressure (mmHg) Index Waveform  Comment  +--------+------------------+-----+---------+-------+  Brachial                          triphasic          +--------+------------------+-----+---------+-------+  ATA                               triphasic          +--------+------------------+-----+---------+-------+  PTA                               triphasic          +--------+------------------+-----+---------+-------+  Right Doppler Findings: +--------+--------+-----+---------+--------+  Site     Pressure Index Doppler   Comments  +--------+--------+-----+---------+--------+  Brachial 177            triphasic           +--------+--------+-----+---------+--------+  Radial                  triphasic           +--------+--------+-----+---------+--------+  Ulnar                   triphasic           +--------+--------+-----+---------+--------+  Left Doppler Findings:  +-----------+--------+-----+---------+-----------------------------------------+  Site        Pressure Index Doppler   Comments                                   +-----------+--------+-----+---------+-----------------------------------------+  Brachial                   triphasic                                            +-----------+--------+-----+---------+-----------------------------------------+  Radial                     triphasic                                            +-----------+--------+-----+---------+-----------------------------------------+  Ulnar                      triphasic                                            +-----------+--------+-----+---------+-----------------------------------------+  Palmar Arch                          Doppler signal remains normal with radial                                        compression and reverses with ulnar                                              compression                                +-----------+--------+-----+---------+-----------------------------------------+  Summary: Right Carotid: The extracranial vessels were near-normal with only minimal wall                thickening or plaque. Left Carotid: The extracranial vessels were near-normal with only minimal wall               thickening or plaque. Vertebrals:  Bilateral vertebral arteries demonstrate antegrade flow. Subclavians: Normal flow hemodynamics were seen in bilateral subclavian              arteries. Right Upper Extremity: Doppler waveforms decrease <50% with right radial compression. Doppler waveforms remain within normal limits with right ulnar compression. Left Upper Extremity: Doppler waveforms remain within normal limits with left radial compression. Doppler waveforms remain within normal limits with left ulnar compression.  Electronically signed by Deitra Mayo MD on 08/17/2019 at 8:53:34 AM.    Final     Assessment/Plan  1. Hyperkalemia Lab Results  Component  Value Date   K 6.1 (A) 08/27/2019   -   will give Kayexalate 15 g / 60 mL give 60 mL orally now at 1PM then repeat at 6 PM  2. 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  Labs/tests ordered: Repeat BMP  Goals of care:   Short-term care   Durenda Age, DNP, FNP-BC [facility] [phone] (Monday-Friday 8:00 a.m. - 5:00 p.m.) [phone] (after hours)

## 2019-08-30 LAB — LIPID PANEL
Cholesterol: 97 (ref 0–200)
HDL: 29 — AB (ref 35–70)
LDL Cholesterol: 35
LDl/HDL Ratio: 3.3
Triglycerides: 163 — AB (ref 40–160)

## 2019-08-30 LAB — CBC AND DIFFERENTIAL
HCT: 38 (ref 36–46)
Hemoglobin: 12.4 (ref 12.0–16.0)
Platelets: 372 (ref 150–399)
WBC: 11

## 2019-08-30 LAB — COMPREHENSIVE METABOLIC PANEL
Calcium: 10.1 (ref 8.7–10.7)
GFR calc Af Amer: 46.74
GFR calc non Af Amer: 40.33

## 2019-08-30 LAB — BASIC METABOLIC PANEL
BUN: 32 — AB (ref 4–21)
CO2: 22 (ref 13–22)
Chloride: 94 — AB (ref 99–108)
Creatinine: 1.3 — AB (ref 0.5–1.1)
Glucose: 123
Potassium: 4.2 (ref 3.4–5.3)
Sodium: 131 — AB (ref 137–147)

## 2019-08-30 LAB — CBC: RBC: 4 (ref 3.87–5.11)

## 2019-09-02 ENCOUNTER — Non-Acute Institutional Stay (SKILLED_NURSING_FACILITY): Payer: Medicare PPO | Admitting: Adult Health

## 2019-09-02 ENCOUNTER — Encounter: Payer: Self-pay | Admitting: Adult Health

## 2019-09-02 DIAGNOSIS — Z951 Presence of aortocoronary bypass graft: Secondary | ICD-10-CM | POA: Diagnosis not present

## 2019-09-02 DIAGNOSIS — I10 Essential (primary) hypertension: Secondary | ICD-10-CM

## 2019-09-02 DIAGNOSIS — D649 Anemia, unspecified: Secondary | ICD-10-CM

## 2019-09-02 DIAGNOSIS — J9601 Acute respiratory failure with hypoxia: Secondary | ICD-10-CM

## 2019-09-02 DIAGNOSIS — J438 Other emphysema: Secondary | ICD-10-CM | POA: Diagnosis not present

## 2019-09-02 DIAGNOSIS — I25110 Atherosclerotic heart disease of native coronary artery with unstable angina pectoris: Secondary | ICD-10-CM

## 2019-09-02 DIAGNOSIS — E875 Hyperkalemia: Secondary | ICD-10-CM | POA: Diagnosis not present

## 2019-09-02 DIAGNOSIS — K219 Gastro-esophageal reflux disease without esophagitis: Secondary | ICD-10-CM | POA: Diagnosis not present

## 2019-09-02 DIAGNOSIS — D72829 Elevated white blood cell count, unspecified: Secondary | ICD-10-CM

## 2019-09-02 DIAGNOSIS — E871 Hypo-osmolality and hyponatremia: Secondary | ICD-10-CM

## 2019-09-02 NOTE — Progress Notes (Signed)
Location:  [facility] Room Number: 310-B Place of Service:  SNF (31) Provider:  Durenda Age, MSN, DNP, FNP-BC  Patient Care Team: [name], MD as PCP - General (Family Medicine) Nahser, Wonda Cheng, MD as PCP - Cardiology (Cardiology) Teena Irani, MD (Inactive) (Gastroenterology) Inda Castle, MD (Inactive) as Consulting Physician (Gastroenterology)  Extended Emergency Contact Information Primary Emergency Contact: Tezra, Mahr Address: [address], [place] [address] Johnnette Litter of [place] Phone: [phone] Mobile Phone: [phone] Relation: Spouse  Code Status:  FULL CODE  Goals of care: Advanced Directive information Advanced Directives 08/25/2019  Does Patient Have a Medical Advance Directive? Yes  Type of Advance Directive (No Data)  Does patient want to make changes to medical advance directive? No - Patient declined  Would patient like information on creating a medical advance directive? -  Pre-existing out of facility DNR order (yellow form or pink MOST form) -     Chief Complaint  Patient presents with  . Medical Management of Chronic Issues    Routine short-term rehabilitation visit    HPI:   Pt is an 79 y.o. female seen today for medical management of chronic diseases.  See says short-term rehabilitation resident of [facility]. She has a PMH of syncope, polymyalgia rheumatica, osteoporosis, DJD, lumbar spinal stenosis, IBS, dyslipidemia, essential hypertension, GERD, anxiety/depression, CAD, CVA, chronic kidney disease and asthma. She was seen today and stated that she is still weak. Staff reported that during PT and OT today, her O2 sat went down to 86% while on room air. She was crying during her ADL exercises and stated,"I did not think that it is gong to be this hard. Discussed with medical director. She is an 79 year old female who had a CAD S/P elective CABG X4 on  08/17/19. She is now needing oxygen at 2L/min via [place] PRN for SOB.She needs to have extension of her rehabilitation insurance coverage since she is not yet stable to go home. She still has poor physical endurance as evidenced by limited standing tolerance, desating to 86% during PT/OT. She was recently treated for elevated K 6.1. 08/27/19. Kayexalate was given and repeat K was 4.2. Further review of lab showed that she had an elevation of wbc from 7.1 to 11.0 . There was no reported fever. In addition, her Na 131 (08/30/19) which was 130 on 08/27/19. She is currently not taking any diuretic.   Past Medical History:  Diagnosis Date  . Anxiety   . Arthritis    "knees and back" (02/12/2013)  . Arthritis   . Asthma    no problems recently  . Blood dyscrasia    sperocytosis  . Cancer ([place]) 04-06-12   skin cancer nasal bridge-no problems now  . Chronic kidney disease (CKD), stage III (moderate)   . COPD (chronic obstructive pulmonary disease) ([facility])   . Coronary artery disease   . Depression   . Diverticulitis   . Fatigue   . GERD (gastroesophageal reflux disease)   . Gout 04/05/2013  . H/O hiatal hernia   . History of blood transfusion    "2 units in 12/2012; suppose to get 1 unit today" (02/12/2013)  . HTN (hypertension)   . Hyperbilirubinemia 05/03/2013  . Hypercalcemia   . Hypercholesteremia    "above borderline; I don't take RX for it" (12/26/2011)  . IBS (irritable bowel syndrome)    ibs  . Iron deficiency anemia  [facility]  . Leukocytopenia   . Lumbar spinal stenosis   . Lymphopenia 08/23/2011  . Osteoarthritis   . Osteoporosis   . PMR (polymyalgia rheumatica) ([facility])    pmr  . Polymyalgia ([facility])   . Renal insufficiency   . Syncope and collapse 12/24/2011   "loss of consciousness for 3-4 min" (12/26/2011)  . UTI (urinary tract infection) 12/26/2011   Past Surgical History:  Procedure Laterality Date  . APPENDECTOMY  79  . arthroscopic knee surgery-left     . BREAST EXCISIONAL BIOPSY Right 1995  . BREAST SURGERY  04-06-12   rt. lumpectomy_benign  . CARDIAC CATHETERIZATION  08/12/2019  . carpal tunnel right    . COLONOSCOPY, ESOPHAGOGASTRODUODENOSCOPY (EGD) AND ESOPHAGEAL DILATION     06/2013  . CORONARY ARTERY BYPASS GRAFT N/A 08/17/2019   Procedure: CORONARY ARTERY BYPASS GRAFTING (CABG) x 4 using LIMA to LAD(d) and endscopic right greater saphenous vein harvest: SVG to Diag1; SVG to OM1; SVG to PDA. Flowtrack: Only;  Surgeon: [name], MD;  Location: [facility];  Service: Open Heart Surgery;  Laterality: N/A;  . ENDOVEIN HARVEST OF GREATER SAPHENOUS VEIN Right 08/17/2019   Procedure: ENDOVEIN HARVEST OF GREATER SAPHENOUS VEIN;  Surgeon: [name], MD;  Location: [facility];  Service: Open Heart Surgery;  Laterality: Right;  . ESOPHAGOGASTRODUODENOSCOPY  12/26/2011   Procedure: ESOPHAGOGASTRODUODENOSCOPY (EGD);  Surgeon: Inda Castle, MD;  Location: [facility];  Service: Endoscopy;  Laterality: N/A;  . EYE SURGERY     cataracts  . HERNIA REPAIR  1970's   "stomach"   . LAPAROSCOPIC NISSEN FUNDOPLICATION N/A 5/44/9201   Procedure: Laparoscopic Reduction and Repair of Paraesophagel Hiatal Hernia with Nissen;  Surgeon: Adin Hector, MD;  Location: [facility];  Service: General;  Laterality: N/A;  Laparoscopic Reduction and Repair of Paraesophagel Hiatal Hernia with Nissen  . LEFT HEART CATH AND CORONARY ANGIOGRAPHY N/A 08/12/2019   Procedure: LEFT HEART CATH AND CORONARY ANGIOGRAPHY;  Surgeon: Jettie Booze, MD;  Location: [facility];  Service: Cardiovascular;  Laterality: N/A;  . NISSEN FUNDOPLICATION     0/0712  . TEE WITHOUT CARDIOVERSION N/A 08/17/2019   Procedure: TRANSESOPHAGEAL ECHOCARDIOGRAM (TEE);  Surgeon: [name], MD;  Location: [facility];  Service: Open Heart Surgery;  Laterality: N/A;  . TOTAL KNEE ARTHROPLASTY Right 05/19/2015   Procedure: RIGHT TOTAL KNEE ARTHROPLASTY;  Surgeon: Netta Cedars,  MD;  Location: [facility];  Service: Orthopedics;  Laterality: Right;  . TUBAL LIGATION  1970's  . VAGINAL HYSTERECTOMY  ~ 1980   right ovary    Allergies  Allergen Reactions  . Penicillins Hives    Last reaction was at 34 Has patient had a PCN reaction causing immediate rash, facial/tongue/throat swelling, SOB or lightheadedness with hypotension: Yes Has patient had a PCN reaction causing severe rash involving mucus membranes or skin necrosis: No Has patient had a PCN reaction that required hospitalization No Has patient had a PCN reaction occurring within the last 10 years: No If all of the above answers are "NO", then may proceed with Cephalosporin use.   Garlon Hatchet [Arformoterol] Other (See Comments)    "makes me nervous"  . Paxil [Paroxetine Hcl] Rash    Outpatient Encounter Medications as of 09/02/2019  Medication Sig  . albuterol (PROVENTIL HFA;VENTOLIN HFA) 108 (90 Base) MCG/ACT inhaler Inhale 2 puffs into the lungs every 6 (six) hours as needed for wheezing or shortness of breath.  [name] amLODipine (NORVASC)  10 MG tablet Take 1 tablet (10 mg total) by mouth daily.  [name] aspirin EC 325 MG EC tablet Take 1 tablet (325 mg total) by mouth daily.  . bisacodyl (DULCOLAX) 10 MG suppository If not relieved by MOM, give 10 mg Bisacodyl suppositiory rectally X 1 dose in 24 hours as needed (Do not use constipation standing orders for residents with renal failure/CFR less than 30. Contact MD for orders) (Physician Order)  . Calcium Carbonate-Vitamin D3 (CALCIUM 600/VITAMIN D) 600-400 MG-UNIT TABS Take 2 tablets by mouth daily.  . cyanocobalamin 500 MCG tablet Take 500 mcg by mouth daily after breakfast. Vitamin B12  . ferrous sulfate 325 (65 FE) MG tablet Take 325 mg by mouth every Monday, Wednesday, and Friday.  . folic acid (FOLVITE) 967 MCG tablet Take 400 mcg by mouth daily after breakfast.   . furosemide (LASIX) 40 MG tablet Take 1 tablet (40 mg total) by mouth daily. For 5 days then stop.  .  magnesium hydroxide (MILK OF MAGNESIA) 400 MG/5ML suspension If no BM in 3 days, give 30 cc Milk of Magnesium p.o. x 1 dose in 24 hours as needed (Do not use standing constipation orders for residents with renal failure CFR less than 30. Contact MD for orders) (Physician Order)  . Menthol, Topical Analgesic, (ICY HOT) 7.5 % (Roll) MISC Apply 1 each topically daily as needed (arthritis pain).  . metoprolol tartrate (LOPRESSOR) 50 MG tablet Take 1 tablet (50 mg total) by mouth 2 (two) times daily.  . Multiple Vitamins-Minerals (PRESERVISION AREDS) TABS Take 1 tablet by mouth daily.  . NON FORMULARY Heart healthy diet  . omeprazole (PRILOSEC) 20 MG capsule Take 1 capsule (20 mg total) by mouth daily.  . potassium chloride SA (KLOR-CON) 20 MEQ tablet Take 1 tablet (20 mEq total) by mouth daily. For 5 days then stop.  . predniSONE (DELTASONE) 5 MG tablet Take 1 tablet (5 mg total) by mouth daily after breakfast.  . rosuvastatin (CRESTOR) 20 MG tablet Take 1 tablet (20 mg total) by mouth daily.  . Sodium Phosphates (RA SALINE ENEMA RE) If not relieved by Biscodyl suppository, give disposable Saline Enema rectally X 1 dose/24 hrs as needed (Do not use constipation standing orders for residents with renal failure/CFR less than 30. Contact MD for orders)(Physician Or  . traMADol (ULTRAM) 50 MG tablet Take 1 tablet (50 mg total) by mouth every 6 (six) hours as needed for moderate pain.   No facility-administered encounter medications on file as of 09/02/2019.    Review of Systems  GENERAL: +weakness MOUTH and THROAT: Denies oral discomfort, gingival pain or bleeding RESPIRATORY: +SOB CARDIAC: No chest pain, edema or palpitations GI: No abdominal pain, diarrhea, constipation, heart burn, nausea or vomiting GU: Denies dysuria, frequency, hematuria, incontinence, or discharge NEUROLOGICAL: Denies dizziness, syncope, numbness, or headache PSYCHIATRIC: Denies feelings of depression or anxiety. No report of  hallucinations, insomnia, paranoia, or agitation    Immunization History  Administered Date(s) Administered  . Influenza,inj,Quad PF,6+ Mos 11/28/2014  . Influenza-Unspecified 11/26/2011, 11/18/2012, 12/03/2013, 11/28/2014  . Moderna SARS-COVID-2 Vaccination 07/27/2019   Pertinent  Health Maintenance Due  Topic Date Due  . PNA vac Low Risk Adult (1 of 2 - PCV13) Never done  . INFLUENZA VACCINE  09/19/2019  . DEXA SCAN  Completed   Fall Risk  01/03/2014  Falls in the past year? No     Vitals:   09/02/19 1417  BP: 113/74  Pulse: 84  Resp: 20  Temp: (!) 97.3  F (36.3 C)  TempSrc: Oral  Weight: 153 lb (69.4 kg)  Height: 5\' 2"  (1.575 m)   Body mass index is 27.98 kg/m.  Physical Exam  GENERAL APPEARANCE: Well nourished. Normal body habitus SKIN:  Right leg surgical site with bruising, healing MOUTH and THROAT: Lips are without lesions. Oral mucosa is moist and without lesions. Tongue is normal in shape, size, and color and without lesions RESPIRATORY: Breathing is even & unlabored, BS CTAB CARDIAC: RRR, no murmur,no extra heart sounds, no edema GI: Abdomen soft, normal BS, no masses, no tenderness EXTREMITIES:  Able to move X 4 extremities NEUROLOGICAL: There is no tremor. Speech is clear. Alert and oriented X 3. PSYCHIATRIC:  Affect and behavior are appropriate  Labs reviewed: Recent Labs    08/17/19 1912 08/17/19 1912 08/18/19 0410 08/18/19 0410 08/18/19 1625 08/19/19 0406 08/21/19 0344 08/21/19 0344 08/22/19 0517 08/24/19 0251 08/27/19 0000  NA 139  141   < > 136   < > 137   < > 135   < > 135 134* 130*  K 4.3  4.0   < > 4.3   < > 5.0   < > 3.8   < > 3.8 3.7 6.1*  CL 108   < > 107   < > 107   < > 102   < > 103 102 97*  CO2 21*   < > 21*   < > 24   < > 22   < > 22 23 20   GLUCOSE 164*   < > 152*   < > 121*   < > 105*  --  106* 127*  --   BUN 18   < > 17   < > 20   < > 35*   < > 32* 24* 24*  CREATININE 1.02*   < > 1.04*   < > 1.37*   < > 1.54*   < >  1.36* 1.24* 1.1  CALCIUM 8.3*   < > 8.2*   < > 8.4*   < > 8.6*   < > 8.7* 8.8* 9.2  MG 3.4*  --  2.5*  --  2.5*  --   --   --   --   --   --    < > = values in this interval not displayed.   Recent Labs    08/16/19 0930  AST 16  ALT 14  ALKPHOS 54  BILITOT 1.2  PROT 6.6  ALBUMIN 3.4*   Recent Labs    08/19/19 0406 08/19/19 0406 08/21/19 0344 08/22/19 0517 08/27/19 0000  WBC 9.3   < > 6.8 5.8 7.1  NEUTROABS  --   --   --   --  6  HGB 12.0   < > 10.9* 11.2* 11.8*  HCT 36.8   < > 33.3* 34.0* 35*  MCV 94.6  --  94.3 94.7  --   PLT 86*   < > 122* 145* 313   < > = values in this interval not displayed.   Lab Results  Component Value Date   TSH 0.783 08/04/2015   Lab Results  Component Value Date   HGBA1C 5.5 08/17/2019   Lab Results  Component Value Date   CHOL 91 08/27/2019   HDL 27 (A) 08/27/2019   LDLCALC 33 08/27/2019   TRIG 154 08/27/2019   CHOLHDL 2.3 08/14/2019    Significant Diagnostic Results in last 30 days:  DG Chest 2 View  Result Date:  08/22/2019 CLINICAL DATA:  79 year old female under postoperative evaluation. EXAM: CHEST - 2 VIEW COMPARISON:  Chest x-ray 08/19/2019. FINDINGS: Lung volumes are normal. Atelectasis and/or consolidation in the left lower lobe. Small bilateral pleural effusions. No evidence of pulmonary edema. No pneumothorax. Heart size is normal. Upper mediastinal contours are within normal limits. Aortic atherosclerosis. Status post CABG. IMPRESSION: 1. Persistent atelectasis and/or consolidation in the left lower lobe. 2. Small bilateral pleural effusions. 3. Aortic atherosclerosis. Electronically Signed   By: Vinnie Langton [name]   On: 08/22/2019 08:04   DG Chest 2 View  Result Date: 08/16/2019 CLINICAL DATA:  Preop testing for surgery tomorrow. EXAM: CHEST - 2 VIEW COMPARISON:  02/27/2018 FINDINGS: The heart size and mediastinal contours are within normal limits. Both lungs are clear. The visualized skeletal structures are  unremarkable. IMPRESSION: No active cardiopulmonary disease. Electronically Signed   By: Kathreen Devoid   On: 08/16/2019 09:13   CARDIAC CATHETERIZATION  Result Date: 08/12/2019  Prox Cx lesion is 70% stenosed.  Mid LAD lesion is 75% stenosed.  2nd Diag lesion is 95% stenosed.  Prox RCA lesion is 80% stenosed.  Mid RCA lesion is 80% stenosed.  LV end diastolic pressure is normal.  There is no aortic valve stenosis.  Three vessel CAD. Cardiac surgery consult.  Plan to admit since she is having unstable/ accelerating symptoms.  Will need to consider adding IV heparin if radial site is ok, and as plavix wears off.  Will check echo and hydrate post procedure.   DG Chest Port 1 View  Result Date: 08/19/2019 CLINICAL DATA:  CABG.  History pneumothorax. EXAM: PORTABLE CHEST 1 VIEW COMPARISON:  08/18/2019. FINDINGS: Interim removal of right IJ line and mediastinal drainage catheter. Prior CABG. Cardiomegaly. No pulmonary venous congestion. Left base atelectasis/infiltrate again noted. Small left pleural effusion again noted. No pneumothorax identified. IMPRESSION: 1. Interim removal of right IJ line and mediastinal drainage catheter. No pneumothorax. 2. Prior CABG. Stable cardiomegaly. No pulmonary venous congestion. 3. Persistent left base atelectasis/infiltrate small left pleural effusion. Similar findings noted on prior exam. Electronically Signed   By: [place]   On: 08/19/2019 07:06   DG Chest Port 1 View  Result Date: 08/18/2019 CLINICAL DATA:  Bypass surgery. EXAM: PORTABLE CHEST 1 VIEW COMPARISON:  08/17/2019 FINDINGS: The a tracheal tube and NG tubes have been removed. The right IJ catheter is stable. Mediastinal drain tubes are stable. Persistent left basilar atelectasis and probable small left effusion. The right lung remains clear. No pneumothorax is identified. IMPRESSION: 1. Removal of ET and NG tubes. 2. Persistent left basilar atelectasis and probable small left effusion.  Electronically Signed   By: Marijo Sanes [name]   On: 08/18/2019 09:04   DG Chest Port 1 View  Result Date: 08/17/2019 CLINICAL DATA:  Status post coronary bypass grafting EXAM: PORTABLE CHEST 1 VIEW COMPARISON:  08/16/2019 FINDINGS: Postsurgical changes are now seen. Endotracheal tube, gastric catheter and right jugular line are seen. Gastric catheter extends to just above the gastroesophageal junction and could be advanced several cm deeper to reach the stomach. A mediastinal drain and left thoracostomy tube are seen as well. Lungs are well aerated bilaterally. Patchy atelectatic changes in the left base are seen. No pneumothorax is noted. IMPRESSION: Tubes and lines as described above. The gastric catheter could be advanced several cm to reach the stomach. Left basilar airspace opacity likely related atelectasis. Electronically Signed   By: Inez Catalina [name]   On: 08/17/2019 13:24   VAS  [place] LOWER EXTREMITY SAPHENOUS VEIN MAPPING  Result Date: 08/17/2019 LOWER EXTREMITY VEIN MAPPING Indications:  Pre-op Risk Factors: Coronary artery disease.  Comparison Study: No prior study on file Performing Technologist: Sharion Dove RVS  Examination Guidelines: A complete evaluation includes B-mode imaging, spectral Doppler, color Doppler, and power Doppler as needed of all accessible portions of each vessel. Bilateral testing is considered an integral part of a complete examination. Limited examinations for reoccurring indications may be performed as noted. +---------------+-----------+----------------------+---------------+-----------+   RT Diameter  RT Findings         GSV            LT Diameter  LT Findings      (cm)                                            (cm)                  +---------------+-----------+----------------------+---------------+-----------+      0.46                     Saphenofemoral         0.61                                                   Junction                                   +---------------+-----------+----------------------+---------------+-----------+      0.42                     Proximal thigh         0.43                  +---------------+-----------+----------------------+---------------+-----------+      0.37       branching       Mid thigh            0.52                  +---------------+-----------+----------------------+---------------+-----------+      0.24       branching      Distal thigh          0.44                  +---------------+-----------+----------------------+---------------+-----------+      0.30       branching          Knee                                    +---------------+-----------+----------------------+---------------+-----------+      0.31                       Prox calf            0.34                  +---------------+-----------+----------------------+---------------+-----------+      0.26  Mid calf            0.26       branching  +---------------+-----------+----------------------+---------------+-----------+      0.27                      Distal calf           0.24                  +---------------+-----------+----------------------+---------------+-----------+      0.20       branching         Ankle              0.13                  +---------------+-----------+----------------------+---------------+-----------+ Diagnosing physician: Deitra Mayo MD Electronically signed by Deitra Mayo MD on 08/17/2019 at 8:53:59 AM.    Final    ECHOCARDIOGRAM COMPLETE  Result Date: 08/12/2019    ECHOCARDIOGRAM REPORT   Patient Name:   [name] Date of Exam: 08/12/2019 Medical Rec #:  [id number]         Height:       61.5 in Accession #:    [id number]        Weight:       171.0 lb Date of Birth:  05/12/1940          BSA:          1.778 m Patient Age:    4 years          BP:           136/55 mmHg Patient Gender: F                 HR:           51 bpm.  Exam Location:  Inpatient Procedure: 2D Echo Indications:    CAD 414. 01  History:        Patient has prior history of Echocardiogram examinations, most                 recent 02/28/2018. CAD, COPD; Risk Factors:Hypertension,                 Dyslipidemia and Former Smoker.  Sonographer:    Jannett Celestine RDCS (AE) Referring Phys: [place]  1. Left ventricular ejection fraction, by estimation, is 60 to 65%. The left ventricle has normal function. The left ventricle has no regional wall motion abnormalities. There is mild left ventricular hypertrophy. Left ventricular diastolic parameters are consistent with Grade I diastolic dysfunction (impaired relaxation).  2. Right ventricular systolic function is normal. The right ventricular size is normal.  3. The mitral valve is abnormal. Moderate mitral annular calcification. No evidence of mitral valve regurgitation.  4. The aortic valve was not well visualized. Aortic valve regurgitation is not visualized. No aortic stenosis is present.  5. The inferior vena cava is normal in size with greater than 50% respiratory variability, suggesting right atrial pressure of 3 mmHg. FINDINGS  Left Ventricle: Left ventricular ejection fraction, by estimation, is 60 to 65%. The left ventricle has normal function. The left ventricle has no regional wall motion abnormalities. The left ventricular internal cavity size was normal in size. There is  mild left ventricular hypertrophy. Left ventricular diastolic parameters are consistent with Grade I diastolic dysfunction (impaired relaxation). Right Ventricle: The right ventricular size is normal. Right vetricular wall thickness was not assessed. Right ventricular systolic  function is normal. Left Atrium: Left atrial size was normal in size. Right Atrium: Right atrial size was normal in size. Pericardium: There is no evidence of pericardial effusion. Mitral Valve: The mitral valve is abnormal. Moderate mitral annular  calcification. No evidence of mitral valve regurgitation. Tricuspid Valve: The tricuspid valve is normal in structure. Tricuspid valve regurgitation is trivial. Aortic Valve: The aortic valve was not well visualized. Aortic valve regurgitation is not visualized. No aortic stenosis is present. Pulmonic Valve: The pulmonic valve was not well visualized. Pulmonic valve regurgitation is not visualized. Aorta: The aortic root is normal in size and structure. Venous: The inferior vena cava is normal in size with greater than 50% respiratory variability, suggesting right atrial pressure of 3 mmHg. IAS/Shunts: The interatrial septum was not well visualized.  LEFT VENTRICLE PLAX 2D LVIDd:         4.50 cm  Diastology LVIDs:         3.00 cm  LV e' lateral:   8.38 cm/s LV PW:         1.00 cm  LV E/e' lateral: 9.2 LV IVS:        1.00 cm  LV e' medial:    6.20 cm/s LVOT diam:     2.00 cm  LV E/e' medial:  12.4 LV SV:         57 LV SV Index:   32 LVOT Area:     3.14 cm  RIGHT VENTRICLE TAPSE (M-mode): 2.0 cm LEFT ATRIUM             Index       RIGHT ATRIUM           Index LA diam:        4.20 cm 2.36 cm/m  RA Area:     12.50 cm LA Vol (A2C):   64.2 ml 36.11 ml/m RA Volume:   23.20 ml  13.05 ml/m LA Vol (A4C):   33.2 ml 18.67 ml/m LA Biplane Vol: 51.1 ml 28.74 ml/m  AORTIC VALVE LVOT Vmax:   89.70 cm/s LVOT Vmean:  62.300 cm/s LVOT VTI:    0.183 m  AORTA Ao Root diam: 3.30 cm MITRAL VALVE MV Area (PHT): 2.07 cm     SHUNTS MV Decel Time: 366 msec     Systemic VTI:  0.18 m MV E velocity: 77.10 cm/s   Systemic Diam: 2.00 cm MV A velocity: 112.00 cm/s MV E/A ratio:  0.69 Oswaldo Milian MD Electronically signed by Oswaldo Milian MD Signature Date/Time: 08/12/2019/8:03:37 PM    Final    ECHO INTRAOPERATIVE TEE  Result Date: 08/17/2019  *INTRAOPERATIVE TRANSESOPHAGEAL REPORT *  Patient Name:   [name] Date of Exam: 08/17/2019 Medical Rec #:  [id number]         Height:       61.5 in Accession #:    [id number]         Weight:       161.1 lb Date of Birth:  May 09, 1940          BSA:          1.73 m Patient Age:    38 years          BP:           171/66 mmHg Patient Gender: F                 HR:           59 bpm. Exam Location:  Anesthesiology Transesophogeal exam was perform intraoperatively during surgical procedure. Patient was closely monitored under general anesthesia during the entirety of examination. Indications:     CAD, CABG Sonographer:     Dustin Flock RDCS Performing Phys: [id number] Lucile Crater LIGHTFOOT Diagnosing Phys: Annye Asa MD Complications: No known complications during this procedure. POST-OP IMPRESSIONS Limited Post- CPB exam: - Left Ventricle: The left ventricular function is unchanged from pre-bypass images. Contractility remains normal, without regional motion abnormalities. Overall EF 50-60%. - Aortic Valve: The aortic valve appears unchanged from pre-bypass. - Mitral Valve: The mitral valve appears unchanged from pre-bypass. There is mild MR. - Tricuspid Valve: The tricuspid valve appears unchanged from pre-bypass. PRE-OP FINDINGS  Left Ventricle: The left ventricle has hyperdynamic systolic function, with an ejection fraction of >65%, calculated 65.6%. The cavity size was normal. There is no increase in left ventricular wall thickness. No evidence of left ventricular regional wall motion abnormalities. Right Ventricle: The right ventricle has normal systolic function. The cavity was normal. There is no increase in right ventricular wall thickness. Right ventricular systolic pressure is normal. Left Atrium: Left atrial size was normal in size. The left atrial appendage is well visualized and there is no evidence of thrombus present. Left atrial appendage velocity is normal at greater than 40 cm/s. Right Atrium: Right atrial size was normal in size. Interatrial Septum: No atrial level shunt detected by color flow Doppler. Pericardium: There is no evidence of pericardial effusion. Mitral  Valve: The mitral valve is normal in structure. Mild thickening of the mitral valve leaflet. Mild calcification of the mitral valve leaflet. Mitral valve regurgitation is trivial by color flow Doppler. There is no evidence of mitral valve vegetation. Pulmonary venous flow is normal. There is no evidence of mitral stenosis, with peak gradient 5 mmHg, mean gradient 1 mmHg. Tricuspid Valve: The tricuspid valve was normal in structure. Tricuspid valve regurgitation is trivial by color flow Doppler. There is no evidence of tricuspid valve vegetation. Aortic Valve: The aortic valve is tricuspid. Aortic valve regurgitation was not visualized by color flow Doppler. There is no evidence of aortic valve stenosis, with peak gradient 8 mmHg. There is no evidence of a vegetation on the aortic valve. Pulmonic Valve: The pulmonic valve was normal in structure, with normal leaflet excursion. No evidence of pulmonic stenosis. Pulmonic valve regurgitation is trivial by color flow Doppler. Aorta: The aortic root, ascending aorta and aortic arch are normal in size and structure. Very little atherosclerosis is visualized. Pulmonary Artery: The pulmonary artery is of normal size. Venous: The inferior vena cava was not well visualized, but appears normal in size. +-------------+--------++ MITRAL VALVE          +-------------+--------++ MV Mean grad:1.0 mmHg +-------------+--------++  Annye Asa MD Electronically signed by Annye Asa MD Signature Date/Time: 08/17/2019/5:47:48 PM    Final    Doppler Pre CABG  Result Date: 08/17/2019 PREOPERATIVE VASCULAR EVALUATION  Risk Factors:     Hypertension, hyperlipidemia, coronary artery disease, prior                   CVA. Comparison Study: Prior carotid study from 02/28/18 is available for comparison.                   No significant changes noted. Performing Technologist: Sharion Dove RVS  Examination Guidelines: A complete evaluation includes B-mode imaging, spectral  Doppler, color Doppler, and power Doppler as needed of all accessible portions of each vessel. Bilateral testing is considered an integral  part of a complete examination. Limited examinations for reoccurring indications may be performed as noted.  Right Carotid Findings: +----------+--------+--------+--------+------------+--------+           PSV cm/sEDV cm/sStenosisDescribe    Comments +----------+--------+--------+--------+------------+--------+ CCA Prox  92      14                                   +----------+--------+--------+--------+------------+--------+ CCA Distal81      15                                   +----------+--------+--------+--------+------------+--------+ ICA Prox  105     23              heterogenous         +----------+--------+--------+--------+------------+--------+ ICA Distal96      19                                   +----------+--------+--------+--------+------------+--------+ ECA       112     11                                   +----------+--------+--------+--------+------------+--------+ Portions of this table do not appear on this page. +----------+--------+-------+--------+------------+           PSV cm/sEDV cmsDescribeArm Pressure +----------+--------+-------+--------+------------+ Subclavian103                                 +----------+--------+-------+--------+------------+ +---------+--------+--+--------+--+ VertebralPSV cm/s44EDV cm/s10 +---------+--------+--+--------+--+ Left Carotid Findings: +----------+--------+--------+--------+------------+--------+           PSV cm/sEDV cm/sStenosisDescribe    Comments +----------+--------+--------+--------+------------+--------+ CCA Prox  111     18                                   +----------+--------+--------+--------+------------+--------+ CCA Distal91      20                                    +----------+--------+--------+--------+------------+--------+ ICA Prox  90      17              heterogenous         +----------+--------+--------+--------+------------+--------+ ECA       217     0                                    +----------+--------+--------+--------+------------+--------+ +----------+--------+--------+--------+------------+ SubclavianPSV cm/sEDV cm/sDescribeArm Pressure +----------+--------+--------+--------+------------+           123                                  +----------+--------+--------+--------+------------+ +---------+--------+--+--------+--+ VertebralPSV cm/s53EDV cm/s14 +---------+--------+--+--------+--+  ABI Findings: +--------+------------------+-----+---------+--------+ Right   Rt Pressure (mmHg)IndexWaveform Comment  +--------+------------------+-----+---------+--------+ [id number]                    triphasic         +--------+------------------+-----+---------+--------+  ATA                            triphasic         +--------+------------------+-----+---------+--------+ PTA                            triphasic         +--------+------------------+-----+---------+--------+ +--------+------------------+-----+---------+-------+ Left    Lt Pressure (mmHg)IndexWaveform Comment +--------+------------------+-----+---------+-------+ Brachial                       triphasic        +--------+------------------+-----+---------+-------+ ATA                            triphasic        +--------+------------------+-----+---------+-------+ PTA                            triphasic        +--------+------------------+-----+---------+-------+  Right Doppler Findings: +--------+--------+-----+---------+--------+ Site    PressureIndexDoppler  Comments +--------+--------+-----+---------+--------+ [id number]          triphasic         +--------+--------+-----+---------+--------+ Radial                triphasic         +--------+--------+-----+---------+--------+ Ulnar                triphasic         +--------+--------+-----+---------+--------+  Left Doppler Findings: +-----------+--------+-----+---------+-----------------------------------------+ Site       PressureIndexDoppler  Comments                                  +-----------+--------+-----+---------+-----------------------------------------+ Brachial                triphasic                                          +-----------+--------+-----+---------+-----------------------------------------+ Radial                  triphasic                                          +-----------+--------+-----+---------+-----------------------------------------+ Ulnar                   triphasic                                          +-----------+--------+-----+---------+-----------------------------------------+ Palmar Arch                      Doppler signal remains normal with radial                                  compression and reverses with ulnar  compression                               +-----------+--------+-----+---------+-----------------------------------------+  Summary: Right Carotid: The extracranial vessels were near-normal with only minimal wall                thickening or plaque. Left Carotid: The extracranial vessels were near-normal with only minimal wall               thickening or plaque. Vertebrals:  Bilateral vertebral arteries demonstrate antegrade flow. Subclavians: Normal flow hemodynamics were seen in bilateral subclavian              arteries. Right Upper Extremity: Doppler waveforms decrease <50% with right radial compression. Doppler waveforms remain within normal limits with right ulnar compression. Left Upper Extremity: Doppler waveforms remain within normal limits with left radial compression. Doppler waveforms remain within normal limits  with left ulnar compression.  Electronically signed by Deitra Mayo MD on 08/17/2019 at 8:53:34 AM.    Final     Assessment/Plan  1. Acute respiratory failure with hypoxia (HCC) - O2 sat went down to 86% during PT and OT, will start O2 @ 2L/min via [place] -  Since there is an elevation in wbc to 11.0, will order chest x-ray to rule out pneumonina  2. Leukocytosis, unspecified type -  WBC 11.0, 08/30/19, no reported fever nor coughing -   With episode of desaturation to 86%, will get chest x-ray done to rule out     pneumonia  3. Hyperkalemia Lab Results  Component Value Date   K 6.1 (A) 08/27/2019   Was given Kayexalate x2 on 08/27/19 -  K 4.2, 08/30/19  4. Atherosclerosis of native coronary artery of native heart with unstable angina pectoris (HCC)  S/P CABG x 4 -  Feels weak and has poor physical endurance -   Still needing continued PT and OT, for therapeutic strengthening exercises to      build up endurance for activities of daily living  5. Essential hypertension -   Stable, continue Lopressor and Norvasc  6. Other emphysema ([place]) -   Had O2 desaturation to 86% during PT/OT -    O2 at 2 L/minute via [place] PRN started -    Continue prednisone 5 mg daily and PRN albuterol  7. Chronic anemia Lab Results  Component Value Date   WBC 7.1 08/27/2019   HGB 11.8 (A) 08/27/2019   HCT 35 (A) 08/27/2019   MCV 94.7 08/22/2019   PLT 313 08/27/2019   -  hgb 12.4, 08/30/19, improved -  Discontinue ferrous sulfate  8. Gastroesophageal reflux disease without esophagitis -  Stable, Continue Prilosec  9. Hyponatremia Lab Results  Component Value Date   NA 130 (A) 08/27/2019   K 6.1 (A) 08/27/2019   CO2 20 08/27/2019   GLUCOSE 127 (H) 08/24/2019   BUN 24 (A) 08/27/2019   CREATININE 1.1 08/27/2019   CALCIUM 9.2 08/27/2019   GFRNONAA 45.52 08/27/2019   GFRAA 52.76 08/27/2019   -  Na 131, 08/30/19 - will monitor    Family/ staff Communication: Discussed plan of care with  resident and charge nurse.  Labs/tests ordered:  Chest x-ray today and BMP/CBC in 1 week  Goals of care:   Short-term care   Durenda Age, DNP, FNP-BC [facility] [phone] (Monday-Friday 8:00 a.m. - 5:00 p.m.) [phone] (after hours)

## 2019-09-03 NOTE — Progress Notes (Signed)
Cardiology Office Note:    Date:  09/06/2019   ID:  [name], DOB 12-06-1940, MRN [id number]  PCP:  [name], MD  Cardiologist:  Mertie Moores, MD  Electrophysiologist:  None   Referring MD: [name], MD   Chief Complaint:  Hospitalization Follow-up (S/p CABG)    Patient Profile:    Nicole Meyer is a 79 y.o. female with:   Coronary artery disease   S/p CABG 07/2019  Hypertension   Hyperlipidemia   Chronic kidney disease   Hx of CVA  COPD  PMR   Prior CV studies: Pre-CABG Dopplers 08/17/2019 Minimal plaque without significant stenosis  Echocardiogram 08/12/2019 EF 60-65, no RWMA, mild LVH, GR 1 DD, normal RV SF, moderate MAC  Cardiac catheterization 08/12/2019  Prox Cx lesion is 70% stenosed.  Mid LAD lesion is 75% stenosed.  2nd Diag lesion is 95% stenosed.  Prox RCA lesion is 80% stenosed.  Mid RCA lesion is 80% stenosed.  LV end diastolic pressure is normal.  There is no aortic valve stenosis.   History of Present Illness:    Ms. Truby was evaluated by Dr. Acie Fredrickson last month for chest pain.  She was set up for cardiac catheterization as her symptoms were c/w unstable angina.  Cardiac catheterization demonstrated 3v CAD and she was referred for CABG.  She had a L-LAD, S-PDA, S-OM2 and S-D1 with Dr. Kipp Brood. her post op course was fairly uneventful and she was DC to SNF.  She returns for follow up.  She is here alone.  She is currently staying at [facility].  She had recently been noted to have hyperkalemia and was treated with Kayexalate.  She also had some hypoxia during rehab and had a chest x-ray obtained.  She tells me that the chest x-ray was okay.  Since then, she has been doing well without recurrent episodes of hypoxia.  Her chest is sore but improving.  Her leg is also sore but improving.  She has not had recurrent anginal symptoms.  She has not had orthopnea, paroxysmal nocturnal dyspnea, syncope.  Her appetite is  good.  Past Medical History:  Diagnosis Date  . Anxiety   . Arthritis    "knees and back" (02/12/2013)  . Arthritis   . Asthma    no problems recently  . Blood dyscrasia    sperocytosis  . Cancer ([place]) 04-06-12   skin cancer nasal bridge-no problems now  . Chronic kidney disease (CKD), stage III (moderate)   . COPD (chronic obstructive pulmonary disease) ([place])   . Coronary artery disease   . Depression   . Diverticulitis   . Fatigue   . GERD (gastroesophageal reflux disease)   . Gout 04/05/2013  . H/O hiatal hernia   . History of blood transfusion    "2 units in 12/2012; suppose to get 1 unit today" (02/12/2013)  . HTN (hypertension)   . Hyperbilirubinemia 05/03/2013  . Hypercalcemia   . Hypercholesteremia    "above borderline; I don't take RX for it" (12/26/2011)  . IBS (irritable bowel syndrome)    ibs  . Iron deficiency anemia    [facility]  . Leukocytopenia   . Lumbar spinal stenosis   . Lymphopenia 08/23/2011  . Osteoarthritis   . Osteoporosis   . PMR (polymyalgia rheumatica) (HCC)    pmr  . Polymyalgia ([place])   . Renal insufficiency   . Syncope and collapse 12/24/2011   "loss of consciousness for 3-4 min" (  12/26/2011)  . UTI (urinary tract infection) 12/26/2011    Current Medications: Current Meds  Medication Sig  . acetaminophen (TYLENOL) 325 MG tablet Take 650 mg by mouth every 6 (six) hours as needed for mild pain or moderate pain.  [name] albuterol (PROVENTIL HFA;VENTOLIN HFA) 108 (90 Base) MCG/ACT inhaler Inhale 2 puffs into the lungs every 6 (six) hours as needed for wheezing or shortness of breath.  [name] amLODipine (NORVASC) 10 MG tablet Take 1 tablet (10 mg total) by mouth daily.  [name] aspirin EC 325 MG EC tablet Take 1 tablet (325 mg total) by mouth daily.  . bisacodyl (DULCOLAX) 10 MG suppository If not relieved by MOM, give 10 mg Bisacodyl suppositiory rectally X 1 dose in 24 hours as needed (Do not use constipation standing orders for  residents with renal failure/CFR less than 30. Contact MD for orders) (Physician Order)  . Calcium Carbonate-Vitamin D3 (CALCIUM 600/VITAMIN D) 600-400 MG-UNIT TABS Take 2 tablets by mouth daily.  . cyanocobalamin 500 MCG tablet Take 500 mcg by mouth daily after breakfast. Vitamin S06  . folic acid (FOLVITE) 301 MCG tablet Take 400 mcg by mouth daily after breakfast.   . magnesium hydroxide (MILK OF MAGNESIA) 400 MG/5ML suspension If no BM in 3 days, give 30 cc Milk of Magnesium p.o. x 1 dose in 24 hours as needed (Do not use standing constipation orders for residents with renal failure CFR less than 30. Contact MD for orders) (Physician Order)  . Menthol, Topical Analgesic, (ICY HOT) 7.5 % (Roll) MISC Apply 1 each topically daily as needed (arthritis pain).  . metoprolol tartrate (LOPRESSOR) 50 MG tablet Take 1 tablet (50 mg total) by mouth 2 (two) times daily.  . Multiple Vitamins-Minerals (PRESERVISION AREDS) TABS Take 1 tablet by mouth daily.  . NON FORMULARY Heart healthy diet  . omeprazole (PRILOSEC) 20 MG capsule Take 1 capsule (20 mg total) by mouth daily.  . potassium chloride SA (KLOR-CON) 20 MEQ tablet Take 1 tablet (20 mEq total) by mouth daily. For 5 days then stop.  . predniSONE (DELTASONE) 5 MG tablet Take 1 tablet (5 mg total) by mouth daily after breakfast.  . rosuvastatin (CRESTOR) 20 MG tablet Take 1 tablet (20 mg total) by mouth daily.     Allergies:   Penicillins, Brovana [arformoterol], and Paxil [paroxetine hcl]   Social History   Tobacco Use  . Smoking status: Former Smoker    Packs/day: 1.00    Years: 40.00    Pack years: 40.00    Types: Cigarettes    Quit date: 02/19/2000    Years since quitting: 19.5  . Smokeless tobacco: Never Used  Vaping Use  . Vaping Use: Never used  Substance Use Topics  . Alcohol use: No  . Drug use: No     Family Hx: The patient's family history includes Breast cancer in her maternal aunt; Diabetes in her father; Heart disease in  her father.  Review of Systems  Constitutional: Negative for decreased appetite.     EKGs/Labs/Other Test Reviewed:    EKG:  EKG is  ordered today.  The ekg ordered today demonstrates normal sinus rhythm, heart rate 72, left axis deviation, T wave inversions 1, aVL, QTC 405  Recent Labs: 08/16/2019: ALT 14 08/18/2019: Magnesium 2.5 08/27/2019: BUN 24; Creatinine 1.1; Hemoglobin 11.8; Platelets 313; Potassium 6.1; Sodium 130   Recent Lipid Panel Lab Results  Component Value Date/Time   CHOL 91 08/27/2019 12:00 AM   TRIG 154 08/27/2019 12:00 AM  HDL 27 (A) 08/27/2019 12:00 AM   CHOLHDL 2.3 08/14/2019 08:37 AM   LDLCALC 33 08/27/2019 12:00 AM    Physical Exam:    VS:  BP (!) 143/60   Pulse 75   Ht _0  (1.575 m)   Wt 152 lb 6.4 oz (69.1 kg)   SpO2 93%   BMI 27.87 kg/m     Wt Readings from Last 3 Encounters:  09/06/19 152 lb 6.4 oz (69.1 kg)  09/02/19 153 lb (69.4 kg)  08/27/19 159 lb 3.2 oz (72.2 kg)     Constitutional:      Appearance: Healthy appearance. Not in distress.  Pulmonary:     Effort: Pulmonary effort is normal.     Breath sounds: No wheezing. No rales.  Chest:     Comments: Median sternotomy well-healed without erythema or discharge; chest tube wound stitch intact Cardiovascular:     Normal rate. Regular rhythm. Normal S1. Normal S2.     Murmurs: There is no murmur.  Edema:    Peripheral edema absent.  Abdominal:     Palpations: Abdomen is soft.  Musculoskeletal:     Cervical back: Neck supple.     Comments: Right leg SVG harvesting site wounds without erythema or discharge Skin:    General: Skin is warm and dry.  Neurological:     Mental Status: Alert and oriented to person, place and time.     Cranial Nerves: Cranial nerves are intact.      ASSESSMENT & PLAN:    1. Coronary artery disease involving native coronary artery of native heart without angina pectoris She is s/p recent CABG.  She is slowly progressing and is currently in SNF at  [facility].  Overall, she appears to be stable.  Continue aspirin 325, rosuvastatin 20 mg, metoprolol tartrate 50 mg twice daily.  She will ultimately need to enroll in cardiac rehabilitation when she is released from physical therapy.  She has an appointment with Dr. Acie Fredrickson in August.  She will follow-up as planned.  2. Essential hypertension Blood pressure somewhat above target.  However, she remains weak.  I will hold off on adjusting her medications any further.  If her pressure remains above target at follow-up, consider adding an ACE inhibitor or ARB.  3. Mixed hyperlipidemia LDL optimal on most recent lab work.  Continue current Rx.      Dispo:  Return in 5 weeks (on 10/12/2019) for Scheduled Follow Up, w/ Dr. Acie Fredrickson.   Medication Adjustments/Labs and Tests Ordered: Current medicines are reviewed at length with the patient today.  Concerns regarding medicines are outlined above.  Tests Ordered: Orders Placed This Encounter  Procedures  . EKG 12-Lead   Medication Changes: No orders of the defined types were placed in this encounter.   Signed, Richardson Dopp, PA-C  09/06/2019 9:17 AM    [facility] [address] [place], [place], [place]  [address] Phone: [phone]; Fax: [phone]

## 2019-09-06 ENCOUNTER — Ambulatory Visit (INDEPENDENT_AMBULATORY_CARE_PROVIDER_SITE_OTHER): Payer: Medicare PPO | Admitting: Physician Assistant

## 2019-09-06 ENCOUNTER — Encounter: Payer: Self-pay | Admitting: Physician Assistant

## 2019-09-06 ENCOUNTER — Other Ambulatory Visit: Payer: Self-pay

## 2019-09-06 VITALS — BP 143/60 | HR 75 | Ht 62.0 in | Wt 152.4 lb

## 2019-09-06 DIAGNOSIS — I10 Essential (primary) hypertension: Secondary | ICD-10-CM

## 2019-09-06 DIAGNOSIS — E782 Mixed hyperlipidemia: Secondary | ICD-10-CM | POA: Diagnosis not present

## 2019-09-06 DIAGNOSIS — I2510 Atherosclerotic heart disease of native coronary artery without angina pectoris: Secondary | ICD-10-CM | POA: Diagnosis not present

## 2019-09-06 NOTE — Patient Instructions (Addendum)
Medication Instructions:  Your physician recommends that you continue on your current medications as directed. Please refer to the Current Medication list given to you today.  *If you need a refill on your cardiac medications before your next appointment, please call your pharmacy*  Lab Work: None ordered today  If you have labs (blood work) drawn today and your tests are completely normal, you will receive your results only by: [name] MyChart Message (if you have MyChart) OR . A paper copy in the mail If you have any lab test that is abnormal or we need to change your treatment, we will call you to review the results.  Testing/Procedures: None ordered today  Follow-Up:  Keep follow up on 10/12/19 at 9:40AM with Mertie Moores, MD

## 2019-09-07 ENCOUNTER — Encounter: Payer: Self-pay | Admitting: Internal Medicine

## 2019-09-07 ENCOUNTER — Non-Acute Institutional Stay (SKILLED_NURSING_FACILITY): Payer: Medicare PPO | Admitting: Internal Medicine

## 2019-09-07 DIAGNOSIS — R0902 Hypoxemia: Secondary | ICD-10-CM | POA: Diagnosis not present

## 2019-09-07 DIAGNOSIS — R5381 Other malaise: Secondary | ICD-10-CM | POA: Diagnosis not present

## 2019-09-07 DIAGNOSIS — I25700 Atherosclerosis of coronary artery bypass graft(s), unspecified, with unstable angina pectoris: Secondary | ICD-10-CM | POA: Diagnosis not present

## 2019-09-07 DIAGNOSIS — M79661 Pain in right lower leg: Secondary | ICD-10-CM

## 2019-09-07 NOTE — Patient Instructions (Signed)
See assessment and plan under each diagnosis in the problem list and acutely for this visit 

## 2019-09-07 NOTE — Assessment & Plan Note (Addendum)
Clinically this patient is extremely high risk were she to return home and attempt ADLs, much less try to care for her husband who has had midfoot amputation & is yet ambulatory & independent.

## 2019-09-07 NOTE — Assessment & Plan Note (Signed)
Postoperative state precluded adequate Homans maneuver D-dimer

## 2019-09-07 NOTE — Progress Notes (Signed)
NURSING HOME LOCATION:  [facility] ROOM NUMBER:  [id number]  CODE STATUS:  FULL CODE  PCP:  [name], MD  [address] [place] [address]  This is a nursing facility follow up for specific acute issues of profound debilitation and exertional dyspnea with hypoxemia.  Interim medical record and care since last [place] visit was updated with review of diagnostic studies and change in clinical status since last visit were documented.  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  O2 sats were 88% on room air @ rest.  They recommended continued physical therapy at the SNF for profound debilitation. Here at the SNF PT/OT report that mobilization requires at least 1 person assist even to sit up or stand from the bed.  Her endurance is profoundly compromised and she must rest within 5 minutes.  With exertion O2 sats dropped to 84%. The debilitation and decreased endurance persist despite an improvement in her anemia with hemoglobin rising from 11.8 to 12.4.  She has had minimal deterioration of CKD stage III with a creatinine of 1.3 and GFR of 40.   On 7/4 CXR revealed atelectasis or obstruction of the left lower lobe, small bilateral effusions, and of course aortic atherosclerosis. Her insurance had declined further rehab on 7/16 but this was extended until 7/19. Her husband is at the facility and actually in the same room with [facility] here @ SNF 7/15 was personally reviewed. This revealed slight obscuration of the left CPA suggesting effusion versus pleural thickening. There did appear to be a remote third left posterior rib fracture. Slight subsegmental atelectasis was suggested in the right lower lobe. Vertebra were markedly osteopenic. Her husband is here also for rehab following right midfoot amputation.  Unfortunately wound care nurse has noted some malodorous character necessitating removal of the wound VAC.  Additionally she is  concerned about localized cellulitis and ecchymosis versus ischemic change.  He has an orthopedic follow-up on 7/22.  We have contacted that office with this information to see if he can be seen sooner.  Her husband has made the statement if she is discharged from the SNF he will go home as well even though he is not ambulatory.  Despite her condition she has stated she plans to care for him as much as she can.  Review of systems: She is alert and oriented.  She states that her biggest problem is profound tiredness even with "the least little thing I do".  She does describes intermittent paroxysmal nocturnal dyspnea.  She has some edema.  She has pain at the donor sites in the right lower extremity medially.  She denies anginal type pain. She states that last night oxygen was applied when her O2 sats dropped to 82%.  Constitutional: No fever, significant weight change  Eyes: No redness, discharge, pain, vision change ENT/mouth: No nasal congestion,  purulent discharge, earache, change in hearing, sore throat  Cardiovascular: No chest pain, palpitations  Respiratory: No cough, sputum production, hemoptysis, significant snoring, apnea   Gastrointestinal: No heartburn, dysphagia, abdominal pain, nausea /vomiting, rectal bleeding, melena, change in bowels Genitourinary: No dysuria, hematuria, pyuria, incontinence, nocturia Dermatologic: No rash, pruritus Neurologic: No dizziness, headache, syncope Psychiatric: No significant anxiety, depression, insomnia, anorexia Endocrine: No change in hair/skin/nails, excessive thirst, excessive hunger, excessive urination  Hematologic/lymphatic: No significant bruising, lymphadenopathy, abnormal bleeding  Physical exam:  Pertinent or positive findings: She does appear profoundly fatigued.  She shows minimal emotion during the  interview and exam.  Slight ptosis is present bilaterally.  She has complete dentures.  Slight S4  is present.  She has minor lower lobe  low-grade rhonchi.  Pedal pulses are palpable.  She has 1/2+ edema at the sock line.  There is bruising at the right lower extremity medially at the donor sites.  There is tenderness to even light palpation.  Homans maneuver was not attempted.  General appearance:  no acute distress, increased work of breathing is present.   Lymphatic: No lymphadenopathy about the head, neck, axilla. Eyes: No conjunctival inflammation or lid edema is present. There is no scleral icterus. Ears:  External ear exam shows no significant lesions or deformities.   Nose:  External nasal examination shows no deformity or inflammation. Nasal mucosa are pink and moist without lesions, exudates Oral exam:  Lips and gums are healthy appearing. There is no oropharyngeal erythema or exudate. Neck:  No thyromegaly, masses, tenderness noted.    Heart:  Normal rate and regular rhythm without definite gallop, murmur, click, rub .  Lungs:  without wheezes, rales, rubs. Abdomen: Bowel sounds are normal. Abdomen is soft and nontender with no organomegaly, hernias, masses. GU: Deferred  Extremities:  No cyanosis, clubbing Neurologic exam :Balance, Rhomberg, finger to nose testing could not be completed due to clinical state Skin: Warm & dry w/o tenting. No significant rash.  See summary under each active problem in the Problem List with associated updated therapeutic plan

## 2019-09-07 NOTE — Assessment & Plan Note (Addendum)
She does describe intermittent PND: Clinically volume overload is not suggested.  BNP & D-dimer will be ordered.

## 2019-09-08 NOTE — Assessment & Plan Note (Signed)
She is at high risk for anginal equivalent recurrence with hypoxemia.  Supplemental oxygen will be provided with exertion as well as at night.

## 2019-09-09 ENCOUNTER — Non-Acute Institutional Stay (SKILLED_NURSING_FACILITY): Payer: Medicare PPO | Admitting: Adult Health

## 2019-09-09 ENCOUNTER — Ambulatory Visit: Payer: Medicare Other

## 2019-09-09 ENCOUNTER — Encounter: Payer: Self-pay | Admitting: Adult Health

## 2019-09-09 DIAGNOSIS — J9601 Acute respiratory failure with hypoxia: Secondary | ICD-10-CM

## 2019-09-09 DIAGNOSIS — Z951 Presence of aortocoronary bypass graft: Secondary | ICD-10-CM

## 2019-09-09 DIAGNOSIS — J438 Other emphysema: Secondary | ICD-10-CM | POA: Diagnosis not present

## 2019-09-09 DIAGNOSIS — I2510 Atherosclerotic heart disease of native coronary artery without angina pectoris: Secondary | ICD-10-CM | POA: Diagnosis not present

## 2019-09-09 DIAGNOSIS — R5381 Other malaise: Secondary | ICD-10-CM | POA: Diagnosis not present

## 2019-09-09 DIAGNOSIS — R7989 Other specified abnormal findings of blood chemistry: Secondary | ICD-10-CM

## 2019-09-09 DIAGNOSIS — I25700 Atherosclerosis of coronary artery bypass graft(s), unspecified, with unstable angina pectoris: Secondary | ICD-10-CM

## 2019-09-09 DIAGNOSIS — D72810 Lymphocytopenia: Secondary | ICD-10-CM | POA: Diagnosis not present

## 2019-09-09 DIAGNOSIS — K219 Gastro-esophageal reflux disease without esophagitis: Secondary | ICD-10-CM | POA: Diagnosis not present

## 2019-09-09 DIAGNOSIS — E538 Deficiency of other specified B group vitamins: Secondary | ICD-10-CM

## 2019-09-09 DIAGNOSIS — I10 Essential (primary) hypertension: Secondary | ICD-10-CM

## 2019-09-09 DIAGNOSIS — D649 Anemia, unspecified: Secondary | ICD-10-CM | POA: Diagnosis not present

## 2019-09-09 DIAGNOSIS — E875 Hyperkalemia: Secondary | ICD-10-CM

## 2019-09-09 DIAGNOSIS — R0602 Shortness of breath: Secondary | ICD-10-CM | POA: Diagnosis not present

## 2019-09-09 DIAGNOSIS — Z48812 Encounter for surgical aftercare following surgery on the circulatory system: Secondary | ICD-10-CM | POA: Diagnosis not present

## 2019-09-09 LAB — CBC AND DIFFERENTIAL
HCT: 34 — AB (ref 36–46)
Hemoglobin: 12 (ref 12.0–16.0)
Platelets: 169 (ref 150–399)
WBC: 4.9

## 2019-09-09 LAB — BASIC METABOLIC PANEL WITH GFR
BUN: 17 (ref 4–21)
CO2: 22 (ref 13–22)
Chloride: 101 (ref 99–108)
Creatinine: 0.9 (ref 0.5–1.1)
Glucose: 99
Potassium: 3.8 (ref 3.4–5.3)
Sodium: 137 (ref 137–147)

## 2019-09-09 LAB — COMPREHENSIVE METABOLIC PANEL WITH GFR
Calcium: 8.9 (ref 8.7–10.7)
GFR calc Af Amer: 70.19
GFR calc non Af Amer: 60.56

## 2019-09-09 LAB — CBC: RBC: 3.67 — AB (ref 3.87–5.11)

## 2019-09-09 NOTE — Addendum Note (Signed)
Addended by: Durenda Age C on: 09/09/2019 05:38 PM   Modules accepted: Level of Service

## 2019-09-09 NOTE — Progress Notes (Addendum)
Location:  [facility] Room Number: 310/A Place of Service:  SNF (31) Provider:  Durenda Age, DNP, FNP-BC  Patient Care Team: [name], MD as PCP - General (Family Medicine) Nahser, Wonda Cheng, MD as PCP - Cardiology (Cardiology) Teena Irani, MD (Inactive) (Gastroenterology) Inda Castle, MD (Inactive) as Consulting Physician (Gastroenterology)  Extended Emergency Contact Information Primary Emergency Contact: Johanne, [place] Address: [address], [place] [address] Johnnette Litter of [place] Phone: [phone] Mobile Phone: [phone] Relation: Spouse  Code Status:  Full Code   Goals of care: Advanced Directive information Advanced Directives 09/09/2019  Does Patient Have a Medical Advance Directive? Yes  Type of Advance Directive (No Data)  Does patient want to make changes to medical advance directive? No - Patient declined  Would patient like information on creating a medical advance directive? -  Pre-existing out of facility DNR order (yellow form or pink MOST form) -     Chief Complaint  Patient presents with  . Medical Management of Chronic Issues    Short Term Rehab    HPI:   Pt is a 79 y.o. female seen today for medical management of chronic diseases. She has a PMH of syncope, polymyalgia rheumatica, osteoporosis, DJD, lumbar spinal stenosis, IBS, dyslipidemia, essential hypertension, GERD, anxiety/depression, CAD, CVA, chronic kidney disease and asthma. She was seen today for a routine visit. She is currently on O2 @2L /min via [place]. She takes Prednisone and PRN Albuterol for COPD. Patient had O2 sat of 92%.She denies having chest pains. She was admitted to [facility] on 08/24/19 post hospitalization 08/12/19 to 08/24/19 for CAD S/P elective CABG X4 on 08/17/19. SBPs ranging from 108 to 147. She takes Lopressor and Norvasc for hypertension.   Patient and husband plans to go home against  medical advice tomorrow, 09/10/19. Medical director, administrator and NP discussed with them what other options they can do so they could stay at facility while care is needed.   Past Medical History:  Diagnosis Date  . Anxiety   . Arthritis    "knees and back" (02/12/2013)  . Arthritis   . Asthma    no problems recently  . Blood dyscrasia    sperocytosis  . Cancer ([facility]) 04-06-12   skin cancer nasal bridge-no problems now  . Chronic kidney disease (CKD), stage III (moderate)   . COPD (chronic obstructive pulmonary disease) ([facility])   . Coronary artery disease   . Depression   . Diverticulitis   . Fatigue   . GERD (gastroesophageal reflux disease)   . Gout 04/05/2013  . H/O hiatal hernia   . History of blood transfusion    "2 units in 12/2012; suppose to get 1 unit today" (02/12/2013)  . HTN (hypertension)   . Hyperbilirubinemia 05/03/2013  . Hypercalcemia   . Hypercholesteremia    "above borderline; I don't take RX for it" (12/26/2011)  . IBS (irritable bowel syndrome)    ibs  . Iron deficiency anemia    [facility]  . Leukocytopenia   . Lumbar spinal stenosis   . Lymphopenia 08/23/2011  . Osteoarthritis   . Osteoporosis   . PMR (polymyalgia rheumatica) ([facility])    pmr  . Renal insufficiency   . Syncope and collapse 12/24/2011   "loss of consciousness for 3-4 min" (12/26/2011)  . UTI (urinary tract infection) 12/26/2011   Past Surgical History:  Procedure Laterality Date  .  APPENDECTOMY  79  . arthroscopic knee surgery-left    . BREAST EXCISIONAL BIOPSY Right 1995  . BREAST SURGERY  04-06-12   rt. lumpectomy_benign  . CARDIAC CATHETERIZATION  08/12/2019  . carpal tunnel right    . COLONOSCOPY, ESOPHAGOGASTRODUODENOSCOPY (EGD) AND ESOPHAGEAL DILATION     06/2013  . CORONARY ARTERY BYPASS GRAFT N/A 08/17/2019   Procedure: CORONARY ARTERY BYPASS GRAFTING (CABG) x 4 using LIMA to LAD(d) and endscopic right greater saphenous vein harvest: SVG to Diag1;  SVG to OM1; SVG to PDA. Flowtrack: Only;  Surgeon: [name], MD;  Location: [facility];  Service: Open Heart Surgery;  Laterality: N/A;  . ENDOVEIN HARVEST OF GREATER SAPHENOUS VEIN Right 08/17/2019   Procedure: ENDOVEIN HARVEST OF GREATER SAPHENOUS VEIN;  Surgeon: [name], MD;  Location: [facility];  Service: Open Heart Surgery;  Laterality: Right;  . ESOPHAGOGASTRODUODENOSCOPY  12/26/2011   Procedure: ESOPHAGOGASTRODUODENOSCOPY (EGD);  Surgeon: Inda Castle, MD;  Location: [facility];  Service: Endoscopy;  Laterality: N/A;  . EYE SURGERY     cataracts  . HERNIA REPAIR  1970's   "stomach"   . LAPAROSCOPIC NISSEN FUNDOPLICATION N/A 6/76/1950   Procedure: Laparoscopic Reduction and Repair of Paraesophagel Hiatal Hernia with Nissen;  Surgeon: Adin Hector, MD;  Location: [facility];  Service: General;  Laterality: N/A;  Laparoscopic Reduction and Repair of Paraesophagel Hiatal Hernia with Nissen  . LEFT HEART CATH AND CORONARY ANGIOGRAPHY N/A 08/12/2019   Procedure: LEFT HEART CATH AND CORONARY ANGIOGRAPHY;  Surgeon: Jettie Booze, MD;  Location: [facility];  Service: Cardiovascular;  Laterality: N/A;  . NISSEN FUNDOPLICATION     10/3265  . TEE WITHOUT CARDIOVERSION N/A 08/17/2019   Procedure: TRANSESOPHAGEAL ECHOCARDIOGRAM (TEE);  Surgeon: [name], MD;  Location: [facility];  Service: Open Heart Surgery;  Laterality: N/A;  . TOTAL KNEE ARTHROPLASTY Right 05/19/2015   Procedure: RIGHT TOTAL KNEE ARTHROPLASTY;  Surgeon: Netta Cedars, MD;  Location: [facility];  Service: Orthopedics;  Laterality: Right;  . TUBAL LIGATION  1970's  . VAGINAL HYSTERECTOMY  ~ 1980   right ovary    Allergies  Allergen Reactions  . Penicillins Hives    Last reaction was at 34 Has patient had a PCN reaction causing immediate rash, facial/tongue/throat swelling, SOB or lightheadedness with hypotension: Yes Has patient had a PCN reaction causing severe rash involving mucus membranes or  skin necrosis: No Has patient had a PCN reaction that required hospitalization No Has patient had a PCN reaction occurring within the last 10 years: No If all of the above answers are "NO", then may proceed with Cephalosporin use.   Garlon Hatchet [Arformoterol] Other (See Comments)    "makes me nervous"  . Paxil [Paroxetine Hcl] Rash    Outpatient Encounter Medications as of 09/09/2019  Medication Sig  . acetaminophen (TYLENOL) 325 MG tablet Take 650 mg by mouth every 6 (six) hours as needed for mild pain or moderate pain.  [name] albuterol (PROVENTIL HFA;VENTOLIN HFA) 108 (90 Base) MCG/ACT inhaler Inhale 2 puffs into the lungs every 6 (six) hours as needed for wheezing or shortness of breath.  [name] amLODipine (NORVASC) 10 MG tablet Take 1 tablet (10 mg total) by mouth daily.  [name] aspirin EC 325 MG EC tablet Take 1 tablet (325 mg total) by mouth daily.  . bisacodyl (DULCOLAX) 10 MG suppository If not relieved by MOM, give 10 mg Bisacodyl suppositiory rectally X 1 dose in 24 hours as needed (Do not  use constipation standing orders for residents with renal failure/CFR less than 30. Contact MD for orders) (Physician Order)  . Calcium Carbonate-Vitamin D3 (CALCIUM 600/VITAMIN D) 600-400 MG-UNIT TABS Take 2 tablets by mouth daily.  . cyanocobalamin 500 MCG tablet Take 500 mcg by mouth daily after breakfast. Vitamin E70  . folic acid (FOLVITE) 350 MCG tablet Take 400 mcg by mouth daily after breakfast.   . magnesium hydroxide (MILK OF MAGNESIA) 400 MG/5ML suspension If no BM in 3 days, give 30 cc Milk of Magnesium p.o. x 1 dose in 24 hours as needed (Do not use standing constipation orders for residents with renal failure CFR less than 30. Contact MD for orders) (Physician Order)  . Menthol, Topical Analgesic, (ICY HOT) 7.5 % (Roll) MISC Apply 1 each topically daily as needed (arthritis pain).  . metoprolol tartrate (LOPRESSOR) 50 MG tablet Take 1 tablet (50 mg total) by mouth 2 (two) times daily.  . Multiple  Vitamins-Minerals (PRESERVISION AREDS) TABS Take 1 tablet by mouth daily.  . NON FORMULARY Heart healthy diet  . NON FORMULARY Give 147mls Med-Pass Q daily to help prevent weight loss  . NON FORMULARY Give one Magic Cup Q daily to help prevent weight loss  . ondansetron (ZOFRAN) 4 MG tablet Take 4 mg by mouth every 8 (eight) hours as needed for nausea or vomiting.  . predniSONE (DELTASONE) 5 MG tablet Take 1 tablet (5 mg total) by mouth daily after breakfast.  . rosuvastatin (CRESTOR) 20 MG tablet Take 1 tablet (20 mg total) by mouth daily.  . Sodium Phosphates (RA SALINE ENEMA RE) Place rectally. If not relieved by Biscodyl suppository, give disposable Saline Enema rectally X 1 dose/24 hrs as needed (Do not use constipation standing orders for residents with renal failure/CFR less than 30. Contact MD for orders)(Physician Or  . [DISCONTINUED] pentoxifylline (TRENTAL) 400 MG CR tablet Take 400 mg by mouth 3 (three) times daily with meals.  . [DISCONTINUED] omeprazole (PRILOSEC) 20 MG capsule Take 1 capsule (20 mg total) by mouth daily.  . [DISCONTINUED] OXYGEN Inhale 2 L/min into the lungs as needed (SOB).   No facility-administered encounter medications on file as of 09/09/2019.    Review of Systems  GENERAL: No change in appetite, no fatigue, no weight changes, no fever, chills or weakness MOUTH and THROAT: Denies oral discomfort, gingival pain or bleeding RESPIRATORY: no cough, SOB, DOE, wheezing, hemoptysis CARDIAC: No chest pain or palpitations GI: No abdominal pain, diarrhea, constipation, heart burn, nausea or vomiting GU: Denies dysuria, frequency, hematuria, incontinence, or discharge NEUROLOGICAL: Denies dizziness, syncope, numbness, or headache PSYCHIATRIC: Denies feelings of depression or anxiety. No report of hallucinations, insomnia, paranoia, or agitation   Immunization History  Administered Date(s) Administered  . Influenza,inj,Quad PF,6+ Mos 11/28/2014  .  Influenza-Unspecified 11/26/2011, 11/18/2012, 12/03/2013, 11/28/2014  . Moderna SARS-COVID-2 Vaccination 06/29/2019, 07/27/2019   Pertinent  Health Maintenance Due  Topic Date Due  . PNA vac Low Risk Adult (1 of 2 - PCV13) Never done  . INFLUENZA VACCINE  09/19/2019  . DEXA SCAN  Completed   Fall Risk  01/03/2014  Falls in the past year? No     Vitals:   09/09/19 1430  BP: 108/70  Pulse: 78  Resp: 18  Temp: (!) 97.4 F (36.3 C)  TempSrc: Oral  SpO2: 96%  Weight: 155 lb 9.6 oz (70.6 kg)  Height: 5\' 2"  (1.575 m)   Body mass index is 28.46 kg/m.  Physical Exam  GENERAL APPEARANCE: Well nourished. In  no acute distress. Normal body habitus SKIN:  Midline chest and inner right leg surgical sites healed with bruising MOUTH and THROAT: Lips are without lesions. Oral mucosa is moist and without lesions. Tongue is normal in shape, size, and color and without lesions RESPIRATORY: Breathing is even & unlabored, BS CTAB CARDIAC: RRR, no murmur,no extra heart sounds, RLE trace edema GI: Abdomen soft, normal BS, no masses, no tenderness EXTREMITIES:  Able to move X 4 extremities NEUROLOGICAL: There is no tremor. Speech is clear. Alert and oriented X 3. PSYCHIATRIC:  Affect and behavior are appropriate  Labs reviewed: Recent Labs    08/17/19 1912 08/17/19 1912 08/18/19 0410 08/18/19 0410 08/18/19 1625 08/19/19 0406 08/21/19 0344 08/21/19 0344 08/22/19 0517 08/22/19 0517 08/24/19 0251 08/24/19 0251 08/27/19 0000 08/30/19 0000 09/09/19 0000  NA 139  141   < > 136   < > 137   < > 135   < > 135   < > 134*  --  130* 131* 137  K 4.3  4.0   < > 4.3   < > 5.0   < > 3.8   < > 3.8   < > 3.7   < > 6.1* 4.2 3.8  CL 108   < > 107   < > 107   < > 102   < > 103   < > 102   < > 97* 94* 101  CO2 21*   < > 21*   < > 24   < > 22   < > 22   < > 23   < > 20 22 22   GLUCOSE 164*   < > 152*   < > 121*   < > 105*  --  106*  --  127*  --   --   --   --   BUN 18   < > 17   < > 20   < > 35*   <  > 32*   < > 24*  --  24* 32* 17  CREATININE 1.02*   < > 1.04*   < > 1.37*   < > 1.54*   < > 1.36*   < > 1.24*  --  1.1 1.3* 0.9  CALCIUM 8.3*   < > 8.2*   < > 8.4*   < > 8.6*   < > 8.7*   < > 8.8*   < > 9.2 10.1 8.9  MG 3.4*  --  2.5*  --  2.5*  --   --   --   --   --   --   --   --   --   --    < > = values in this interval not displayed.   Recent Labs    08/16/19 0930  AST 16  ALT 14  ALKPHOS 54  BILITOT 1.2  PROT 6.6  ALBUMIN 3.4*   Recent Labs    08/10/19 1501 08/19/19 0406 08/19/19 0406 08/21/19 0344 08/21/19 0344 08/22/19 0517 08/27/19 0000 08/30/19 0000 09/09/19 0000  WBC   < > 9.3   < > 6.8   < > 5.8 7.1 11.0 4.9  NEUTROABS  --   --   --   --   --   --  6  --   --   HGB   < > 12.0   < > 10.9*   < > 11.2* 11.8* 12.4 12.0  HCT   < > 36.8   < >  33.3*   < > 34.0* 35* 38 34*  MCV  --  94.6  --  94.3  --  94.7  --   --   --   PLT   < > 86*   < > 122*   < > 145* 313 372 169   < > = values in this interval not displayed.   Lab Results  Component Value Date   TSH 0.783 08/04/2015   Lab Results  Component Value Date   HGBA1C 5.5 08/17/2019   Lab Results  Component Value Date   CHOL 97 08/30/2019   HDL 29 (A) 08/30/2019   LDLCALC 35 08/30/2019   TRIG 163 (A) 08/30/2019   CHOLHDL 2.3 08/14/2019    Significant Diagnostic Results in last 30 days:  DG Chest 2 View  Result Date: 08/22/2019 CLINICAL DATA:  79 year old female under postoperative evaluation. EXAM: CHEST - 2 VIEW COMPARISON:  Chest x-ray 08/19/2019. FINDINGS: Lung volumes are normal. Atelectasis and/or consolidation in the left lower lobe. Small bilateral pleural effusions. No evidence of pulmonary edema. No pneumothorax. Heart size is normal. Upper mediastinal contours are within normal limits. Aortic atherosclerosis. Status post CABG. IMPRESSION: 1. Persistent atelectasis and/or consolidation in the left lower lobe. 2. Small bilateral pleural effusions. 3. Aortic atherosclerosis. Electronically Signed    By: Vinnie Langton [name]   On: 08/22/2019 08:04   DG Chest 2 View  Result Date: 08/16/2019 CLINICAL DATA:  Preop testing for surgery tomorrow. EXAM: CHEST - 2 VIEW COMPARISON:  02/27/2018 FINDINGS: The heart size and mediastinal contours are within normal limits. Both lungs are clear. The visualized skeletal structures are unremarkable. IMPRESSION: No active cardiopulmonary disease. Electronically Signed   By: Kathreen Devoid   On: 08/16/2019 09:13   CARDIAC CATHETERIZATION  Result Date: 08/12/2019  Prox Cx lesion is 70% stenosed.  Mid LAD lesion is 75% stenosed.  2nd Diag lesion is 95% stenosed.  Prox RCA lesion is 80% stenosed.  Mid RCA lesion is 80% stenosed.  LV end diastolic pressure is normal.  There is no aortic valve stenosis.  Three vessel CAD. Cardiac surgery consult.  Plan to admit since she is having unstable/ accelerating symptoms.  Will need to consider adding IV heparin if radial site is ok, and as plavix wears off.  Will check echo and hydrate post procedure.   DG Chest Port 1 View  Result Date: 08/19/2019 CLINICAL DATA:  CABG.  History pneumothorax. EXAM: PORTABLE CHEST 1 VIEW COMPARISON:  08/18/2019. FINDINGS: Interim removal of right IJ line and mediastinal drainage catheter. Prior CABG. Cardiomegaly. No pulmonary venous congestion. Left base atelectasis/infiltrate again noted. Small left pleural effusion again noted. No pneumothorax identified. IMPRESSION: 1. Interim removal of right IJ line and mediastinal drainage catheter. No pneumothorax. 2. Prior CABG. Stable cardiomegaly. No pulmonary venous congestion. 3. Persistent left base atelectasis/infiltrate small left pleural effusion. Similar findings noted on prior exam. Electronically Signed   By: [place]   On: 08/19/2019 07:06   DG Chest Port 1 View  Result Date: 08/18/2019 CLINICAL DATA:  Bypass surgery. EXAM: PORTABLE CHEST 1 VIEW COMPARISON:  08/17/2019 FINDINGS: The a tracheal tube and NG tubes have been  removed. The right IJ catheter is stable. Mediastinal drain tubes are stable. Persistent left basilar atelectasis and probable small left effusion. The right lung remains clear. No pneumothorax is identified. IMPRESSION: 1. Removal of ET and NG tubes. 2. Persistent left basilar atelectasis and probable small left effusion. Electronically Signed   By: Mamie Nick.  Gallerani [name]   On: 08/18/2019 09:04   DG Chest Port 1 View  Result Date: 08/17/2019 CLINICAL DATA:  Status post coronary bypass grafting EXAM: PORTABLE CHEST 1 VIEW COMPARISON:  08/16/2019 FINDINGS: Postsurgical changes are now seen. Endotracheal tube, gastric catheter and right jugular line are seen. Gastric catheter extends to just above the gastroesophageal junction and could be advanced several cm deeper to reach the stomach. A mediastinal drain and left thoracostomy tube are seen as well. Lungs are well aerated bilaterally. Patchy atelectatic changes in the left base are seen. No pneumothorax is noted. IMPRESSION: Tubes and lines as described above. The gastric catheter could be advanced several cm to reach the stomach. Left basilar airspace opacity likely related atelectasis. Electronically Signed   By: Inez Catalina [name]   On: 08/17/2019 13:24   VAS [place] LOWER EXTREMITY SAPHENOUS VEIN MAPPING  Result Date: 08/17/2019 LOWER EXTREMITY VEIN MAPPING Indications:  Pre-op Risk Factors: Coronary artery disease.  Comparison Study: No prior study on file Performing Technologist: Sharion Dove RVS  Examination Guidelines: A complete evaluation includes B-mode imaging, spectral Doppler, color Doppler, and power Doppler as needed of all accessible portions of each vessel. Bilateral testing is considered an integral part of a complete examination. Limited examinations for reoccurring indications may be performed as noted. +---------------+-----------+----------------------+---------------+-----------+   RT Diameter  RT Findings         GSV            LT  Diameter  LT Findings      (cm)                                            (cm)                  +---------------+-----------+----------------------+---------------+-----------+      0.46                     Saphenofemoral         0.61                                                   Junction                                  +---------------+-----------+----------------------+---------------+-----------+      0.42                     Proximal thigh         0.43                  +---------------+-----------+----------------------+---------------+-----------+      0.37       branching       Mid thigh            0.52                  +---------------+-----------+----------------------+---------------+-----------+      0.24       branching      Distal thigh          0.44                  +---------------+-----------+----------------------+---------------+-----------+  0.30       branching          Knee                                    +---------------+-----------+----------------------+---------------+-----------+      0.31                       Prox calf            0.34                  +---------------+-----------+----------------------+---------------+-----------+      0.26                        Mid calf            0.26       branching  +---------------+-----------+----------------------+---------------+-----------+      0.27                      Distal calf           0.24                  +---------------+-----------+----------------------+---------------+-----------+      0.20       branching         Ankle              0.13                  +---------------+-----------+----------------------+---------------+-----------+ Diagnosing physician: Deitra Mayo MD Electronically signed by Deitra Mayo MD on 08/17/2019 at 8:53:59 AM.    Final    ECHOCARDIOGRAM COMPLETE  Result Date: 08/12/2019    ECHOCARDIOGRAM REPORT   Patient  Name:   [name] Date of Exam: 08/12/2019 Medical Rec #:  [id number]         Height:       61.5 in Accession #:    [id number]        Weight:       171.0 lb Date of Birth:  02/25/1940          BSA:          1.778 m Patient Age:    74 years          BP:           136/55 mmHg Patient Gender: F                 HR:           51 bpm. Exam Location:  Inpatient Procedure: 2D Echo Indications:    CAD 414. 01  History:        Patient has prior history of Echocardiogram examinations, most                 recent 02/28/2018. CAD, COPD; Risk Factors:Hypertension,                 Dyslipidemia and Former Smoker.  Sonographer:    Jannett Celestine RDCS (AE) Referring Phys: [place]  1. Left ventricular ejection fraction, by estimation, is 60 to 65%. The left ventricle has normal function. The left ventricle has no regional wall motion abnormalities. There is mild left ventricular hypertrophy. Left ventricular diastolic parameters are consistent with Grade I diastolic dysfunction (impaired relaxation).  2. Right ventricular systolic function is normal.  The right ventricular size is normal.  3. The mitral valve is abnormal. Moderate mitral annular calcification. No evidence of mitral valve regurgitation.  4. The aortic valve was not well visualized. Aortic valve regurgitation is not visualized. No aortic stenosis is present.  5. The inferior vena cava is normal in size with greater than 50% respiratory variability, suggesting right atrial pressure of 3 mmHg. FINDINGS  Left Ventricle: Left ventricular ejection fraction, by estimation, is 60 to 65%. The left ventricle has normal function. The left ventricle has no regional wall motion abnormalities. The left ventricular internal cavity size was normal in size. There is  mild left ventricular hypertrophy. Left ventricular diastolic parameters are consistent with Grade I diastolic dysfunction (impaired relaxation). Right Ventricle: The right ventricular size  is normal. Right vetricular wall thickness was not assessed. Right ventricular systolic function is normal. Left Atrium: Left atrial size was normal in size. Right Atrium: Right atrial size was normal in size. Pericardium: There is no evidence of pericardial effusion. Mitral Valve: The mitral valve is abnormal. Moderate mitral annular calcification. No evidence of mitral valve regurgitation. Tricuspid Valve: The tricuspid valve is normal in structure. Tricuspid valve regurgitation is trivial. Aortic Valve: The aortic valve was not well visualized. Aortic valve regurgitation is not visualized. No aortic stenosis is present. Pulmonic Valve: The pulmonic valve was not well visualized. Pulmonic valve regurgitation is not visualized. Aorta: The aortic root is normal in size and structure. Venous: The inferior vena cava is normal in size with greater than 50% respiratory variability, suggesting right atrial pressure of 3 mmHg. IAS/Shunts: The interatrial septum was not well visualized.  LEFT VENTRICLE PLAX 2D LVIDd:         4.50 cm  Diastology LVIDs:         3.00 cm  LV e' lateral:   8.38 cm/s LV PW:         1.00 cm  LV E/e' lateral: 9.2 LV IVS:        1.00 cm  LV e' medial:    6.20 cm/s LVOT diam:     2.00 cm  LV E/e' medial:  12.4 LV SV:         57 LV SV Index:   32 LVOT Area:     3.14 cm  RIGHT VENTRICLE TAPSE (M-mode): 2.0 cm LEFT ATRIUM             Index       RIGHT ATRIUM           Index LA diam:        4.20 cm 2.36 cm/m  RA Area:     12.50 cm LA Vol (A2C):   64.2 ml 36.11 ml/m RA Volume:   23.20 ml  13.05 ml/m LA Vol (A4C):   33.2 ml 18.67 ml/m LA Biplane Vol: 51.1 ml 28.74 ml/m  AORTIC VALVE LVOT Vmax:   89.70 cm/s LVOT Vmean:  62.300 cm/s LVOT VTI:    0.183 m  AORTA Ao Root diam: 3.30 cm MITRAL VALVE MV Area (PHT): 2.07 cm     SHUNTS MV Decel Time: 366 msec     Systemic VTI:  0.18 m MV E velocity: 77.10 cm/s   Systemic Diam: 2.00 cm MV A velocity: 112.00 cm/s MV E/A ratio:  0.69 Oswaldo Milian MD  Electronically signed by Oswaldo Milian MD Signature Date/Time: 08/12/2019/8:03:37 PM    Final    ECHO INTRAOPERATIVE TEE  Result Date: 08/17/2019  *INTRAOPERATIVE TRANSESOPHAGEAL REPORT *  Patient Name:  [name] Date of Exam: 08/17/2019 Medical Rec #:  [id number]         Height:       61.5 in Accession #:    [id number]        Weight:       161.1 lb Date of Birth:  1940/08/15          BSA:          1.73 m Patient Age:    35 years          BP:           171/66 mmHg Patient Gender: F                 HR:           59 bpm. Exam Location:  Anesthesiology Transesophogeal exam was perform intraoperatively during surgical procedure. Patient was closely monitored under general anesthesia during the entirety of examination. Indications:     CAD, CABG Sonographer:     Dustin Flock RDCS Performing Phys: [id number] Lucile Crater LIGHTFOOT Diagnosing Phys: Annye Asa MD Complications: No known complications during this procedure. POST-OP IMPRESSIONS Limited Post- CPB exam: - Left Ventricle: The left ventricular function is unchanged from pre-bypass images. Contractility remains normal, without regional motion abnormalities. Overall EF 50-60%. - Aortic Valve: The aortic valve appears unchanged from pre-bypass. - Mitral Valve: The mitral valve appears unchanged from pre-bypass. There is mild MR. - Tricuspid Valve: The tricuspid valve appears unchanged from pre-bypass. PRE-OP FINDINGS  Left Ventricle: The left ventricle has hyperdynamic systolic function, with an ejection fraction of >65%, calculated 65.6%. The cavity size was normal. There is no increase in left ventricular wall thickness. No evidence of left ventricular regional wall motion abnormalities. Right Ventricle: The right ventricle has normal systolic function. The cavity was normal. There is no increase in right ventricular wall thickness. Right ventricular systolic pressure is normal. Left Atrium: Left atrial size was normal in size. The left  atrial appendage is well visualized and there is no evidence of thrombus present. Left atrial appendage velocity is normal at greater than 40 cm/s. Right Atrium: Right atrial size was normal in size. Interatrial Septum: No atrial level shunt detected by color flow Doppler. Pericardium: There is no evidence of pericardial effusion. Mitral Valve: The mitral valve is normal in structure. Mild thickening of the mitral valve leaflet. Mild calcification of the mitral valve leaflet. Mitral valve regurgitation is trivial by color flow Doppler. There is no evidence of mitral valve vegetation. Pulmonary venous flow is normal. There is no evidence of mitral stenosis, with peak gradient 5 mmHg, mean gradient 1 mmHg. Tricuspid Valve: The tricuspid valve was normal in structure. Tricuspid valve regurgitation is trivial by color flow Doppler. There is no evidence of tricuspid valve vegetation. Aortic Valve: The aortic valve is tricuspid. Aortic valve regurgitation was not visualized by color flow Doppler. There is no evidence of aortic valve stenosis, with peak gradient 8 mmHg. There is no evidence of a vegetation on the aortic valve. Pulmonic Valve: The pulmonic valve was normal in structure, with normal leaflet excursion. No evidence of pulmonic stenosis. Pulmonic valve regurgitation is trivial by color flow Doppler. Aorta: The aortic root, ascending aorta and aortic arch are normal in size and structure. Very little atherosclerosis is visualized. Pulmonary Artery: The pulmonary artery is of normal size. Venous: The inferior vena cava was not well visualized, but appears normal in size. +-------------+--------++ MITRAL VALVE          +-------------+--------++  MV Mean grad:1.0 mmHg +-------------+--------++  Annye Asa MD Electronically signed by Annye Asa MD Signature Date/Time: 08/17/2019/5:47:48 PM    Final    Doppler Pre CABG  Result Date: 08/17/2019 PREOPERATIVE VASCULAR EVALUATION  Risk Factors:      Hypertension, hyperlipidemia, coronary artery disease, prior                   CVA. Comparison Study: Prior carotid study from 02/28/18 is available for comparison.                   No significant changes noted. Performing Technologist: Sharion Dove RVS  Examination Guidelines: A complete evaluation includes B-mode imaging, spectral Doppler, color Doppler, and power Doppler as needed of all accessible portions of each vessel. Bilateral testing is considered an integral part of a complete examination. Limited examinations for reoccurring indications may be performed as noted.  Right Carotid Findings: +----------+--------+--------+--------+------------+--------+           PSV cm/sEDV cm/sStenosisDescribe    Comments +----------+--------+--------+--------+------------+--------+ CCA Prox  92      14                                   +----------+--------+--------+--------+------------+--------+ CCA Distal81      15                                   +----------+--------+--------+--------+------------+--------+ ICA Prox  105     23              heterogenous         +----------+--------+--------+--------+------------+--------+ ICA Distal96      19                                   +----------+--------+--------+--------+------------+--------+ ECA       112     11                                   +----------+--------+--------+--------+------------+--------+ Portions of this table do not appear on this page. +----------+--------+-------+--------+------------+           PSV cm/sEDV cmsDescribeArm Pressure +----------+--------+-------+--------+------------+ Subclavian103                                 +----------+--------+-------+--------+------------+ +---------+--------+--+--------+--+ VertebralPSV cm/s44EDV cm/s10 +---------+--------+--+--------+--+ Left Carotid Findings: +----------+--------+--------+--------+------------+--------+           PSV cm/sEDV  cm/sStenosisDescribe    Comments +----------+--------+--------+--------+------------+--------+ CCA Prox  111     18                                   +----------+--------+--------+--------+------------+--------+ CCA Distal91      20                                   +----------+--------+--------+--------+------------+--------+ ICA Prox  90      17              heterogenous         +----------+--------+--------+--------+------------+--------+ ECA  217     0                                    +----------+--------+--------+--------+------------+--------+ +----------+--------+--------+--------+------------+ SubclavianPSV cm/sEDV cm/sDescribeArm Pressure +----------+--------+--------+--------+------------+           123                                  +----------+--------+--------+--------+------------+ +---------+--------+--+--------+--+ VertebralPSV cm/s53EDV cm/s14 +---------+--------+--+--------+--+  ABI Findings: +--------+------------------+-----+---------+--------+ Right   Rt Pressure (mmHg)IndexWaveform Comment  +--------+------------------+-----+---------+--------+ [id number]                    triphasic         +--------+------------------+-----+---------+--------+ ATA                            triphasic         +--------+------------------+-----+---------+--------+ PTA                            triphasic         +--------+------------------+-----+---------+--------+ +--------+------------------+-----+---------+-------+ Left    Lt Pressure (mmHg)IndexWaveform Comment +--------+------------------+-----+---------+-------+ Brachial                       triphasic        +--------+------------------+-----+---------+-------+ ATA                            triphasic        +--------+------------------+-----+---------+-------+ PTA                            triphasic         +--------+------------------+-----+---------+-------+  Right Doppler Findings: +--------+--------+-----+---------+--------+ Site    PressureIndexDoppler  Comments +--------+--------+-----+---------+--------+ [id number]          triphasic         +--------+--------+-----+---------+--------+ Radial               triphasic         +--------+--------+-----+---------+--------+ Ulnar                triphasic         +--------+--------+-----+---------+--------+  Left Doppler Findings: +-----------+--------+-----+---------+-----------------------------------------+ Site       PressureIndexDoppler  Comments                                  +-----------+--------+-----+---------+-----------------------------------------+ Brachial                triphasic                                          +-----------+--------+-----+---------+-----------------------------------------+ Radial                  triphasic                                          +-----------+--------+-----+---------+-----------------------------------------+ Ulnar  triphasic                                          +-----------+--------+-----+---------+-----------------------------------------+ Palmar Arch                      Doppler signal remains normal with radial                                  compression and reverses with ulnar                                        compression                               +-----------+--------+-----+---------+-----------------------------------------+  Summary: Right Carotid: The extracranial vessels were near-normal with only minimal wall                thickening or plaque. Left Carotid: The extracranial vessels were near-normal with only minimal wall               thickening or plaque. Vertebrals:  Bilateral vertebral arteries demonstrate antegrade flow. Subclavians: Normal flow hemodynamics were seen in bilateral subclavian               arteries. Right Upper Extremity: Doppler waveforms decrease <50% with right radial compression. Doppler waveforms remain within normal limits with right ulnar compression. Left Upper Extremity: Doppler waveforms remain within normal limits with left radial compression. Doppler waveforms remain within normal limits with left ulnar compression.  Electronically signed by Deitra Mayo MD on 08/17/2019 at 8:53:34 AM.    Final     Assessment/Plan  1. 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 Acute respiratory failure with hypoxia (HCC) - O2 sat decreased to 92%, continue O2 at 2 L/minute via [place] and PRN albuterol  3. Hyperkalemia Lab Results  Component Value Date   K 3.8 09/09/2019   -  resolved  4. Other emphysema ([place]) -  O2 sat drops to 86-92%, continue O2/minute via , prednisone and PRN albuterol  5. Essential hypertension -Controlled, continue Norvasc and Lopressor  6. Vitamin B12 deficiency Lab Results  Component Value Date   VITAMINB12 388 01/28/2013   -Continue vitamin B12 500 mcg daily  7. Gastroesophageal reflux disease without esophagitis -Stable, continue Prilosec  8.  Physical deconditioning -Continue PT and OT, for therapeutic strengthening exercises -Fall precautions  9. Elevated D-dimer -  D-dimer 2,730, elevated -   Complained of pain on RLE and has been having decreased O2 sats -  Will order venous doppler ultrasound to RLE to rule out DVT -  After the ultrasound, will discontinue ASA then start Eliquis 10 mg BID X 7 days then 5 mg BID -  Market researcher, NP and administrator talked to patient and husband 3 times today and discussed that Ms. Clarida is not medically stable to go home and discussed the elevated D-dimer result. Husband does not want to stay at  facility and  plans to go home AMA with patient tomorrow, 09/10/19  09/10/19 Addendum: Venous Doppler ultrasound on RLE was negative for DVT. Eliquis will be discontinued and will continue aspirin EC 325 mg daily. Patient needs to follow-up with cardiology ASAP.  Discussed with patient and charge nurse.   Family/ staff Communication:  Discussed plan of care with resident and charge nurse. Spent >45 minutes discussing with patient, Contractor about patient's condition.  Labs/tests ordered:  Venous doppler ultrasound of RLE  Goals of care:  Short-term care   Durenda Age, DNP, FNP-BC [facility] [phone] (Monday-Friday 8:00 a.m. - 5:00 p.m.) [phone] (after hours) .

## 2019-09-10 DIAGNOSIS — R062 Wheezing: Secondary | ICD-10-CM | POA: Diagnosis not present

## 2019-09-10 DIAGNOSIS — R0902 Hypoxemia: Secondary | ICD-10-CM | POA: Diagnosis not present

## 2019-09-10 DIAGNOSIS — M79661 Pain in right lower leg: Secondary | ICD-10-CM | POA: Diagnosis not present

## 2019-09-28 ENCOUNTER — Other Ambulatory Visit: Payer: Self-pay

## 2019-09-28 ENCOUNTER — Ambulatory Visit (INDEPENDENT_AMBULATORY_CARE_PROVIDER_SITE_OTHER): Payer: Self-pay | Admitting: Thoracic Surgery (Cardiothoracic Vascular Surgery)

## 2019-09-28 ENCOUNTER — Encounter: Payer: Self-pay | Admitting: Thoracic Surgery (Cardiothoracic Vascular Surgery)

## 2019-09-28 VITALS — BP 140/88 | HR 104 | Temp 97.2°F | Resp 18 | Ht 62.0 in | Wt 158.2 lb

## 2019-09-28 DIAGNOSIS — Z951 Presence of aortocoronary bypass graft: Secondary | ICD-10-CM

## 2019-09-28 DIAGNOSIS — I25110 Atherosclerotic heart disease of native coronary artery with unstable angina pectoris: Secondary | ICD-10-CM

## 2019-09-28 NOTE — Progress Notes (Signed)
      [address]       [place] [address]             [phone]        Cyndy A Platz [place] Medical Record #[id number] Date of Birth: 27-Sep-1940  Referring: Nahser, Wonda Cheng, MD Primary Care: [name], MD Primary Cardiologist:Philip Nahser, MD  Reason for visit:   follow-up  History of Present Illness:     Nicole Meyer comes in for her second follow-up appointment. She complains of continued exertional dyspnea. #She remains on supplemental oxygen but does not bring it with her today in clinic. She has not been very active since being home and has very little assistance as well. Her neighbor who brought her today informed me that neither her nor her husband are able to to fully take care of themselves, and that she may have been discharged from the nursing home too early.  Physical Exam: BP 140/88 (BP Location: Left Arm, Patient Position: Sitting, Cuff Size: Normal)   Pulse (!) 104   Temp (!) 97.2 F (36.2 C)   Resp 18   Ht 5\' 2"  (1.575 m)   Wt 158 lb 3.2 oz (71.8 kg)   SpO2 (!) 89% Comment: RA...on O2 but didn't bring d/t being too heavy  BMI 28.94 kg/m   Alert NAD Incision clean.  Sternum stable Abdomen soft, ND No peripheral edema     Assessment / Plan:   79 year old female status post CABG. She is quite deconditioned prior to surgery and remained so afterwards. She will require cardiac rehab but I am somewhat concerned that she may not be able to make her appointments. I will touch base with her family to see if they can assist with better home care.  Follow-up as needed    [name] 09/28/2019 12:23 PM

## 2019-10-01 ENCOUNTER — Encounter: Payer: Medicare PPO | Admitting: Thoracic Surgery (Cardiothoracic Vascular Surgery)

## 2019-10-11 DIAGNOSIS — R062 Wheezing: Secondary | ICD-10-CM | POA: Diagnosis not present

## 2019-10-11 DIAGNOSIS — R0902 Hypoxemia: Secondary | ICD-10-CM | POA: Diagnosis not present

## 2019-10-12 ENCOUNTER — Ambulatory Visit: Payer: Medicare PPO | Admitting: Cardiovascular Disease

## 2019-10-12 ENCOUNTER — Other Ambulatory Visit: Payer: Self-pay

## 2019-10-12 ENCOUNTER — Encounter: Payer: Self-pay | Admitting: Cardiovascular Disease

## 2019-10-12 VITALS — BP 140/70 | HR 66 | Ht 62.0 in | Wt 155.2 lb

## 2019-10-12 DIAGNOSIS — I739 Peripheral vascular disease, unspecified: Secondary | ICD-10-CM

## 2019-10-12 DIAGNOSIS — Z951 Presence of aortocoronary bypass graft: Secondary | ICD-10-CM | POA: Diagnosis not present

## 2019-10-12 DIAGNOSIS — I200 Unstable angina: Secondary | ICD-10-CM

## 2019-10-12 DIAGNOSIS — E782 Mixed hyperlipidemia: Secondary | ICD-10-CM

## 2019-10-12 DIAGNOSIS — I2510 Atherosclerotic heart disease of native coronary artery without angina pectoris: Secondary | ICD-10-CM | POA: Diagnosis not present

## 2019-10-12 DIAGNOSIS — I10 Essential (primary) hypertension: Secondary | ICD-10-CM

## 2019-10-12 MED ORDER — HYDROCHLOROTHIAZIDE 25 MG PO TABS
25.0000 mg | ORAL_TABLET | Freq: Every day | ORAL | 11 refills | Status: DC
Start: 2019-10-12 — End: 2020-06-02

## 2019-10-12 MED ORDER — POTASSIUM CHLORIDE ER 10 MEQ PO TBCR
20.0000 meq | EXTENDED_RELEASE_TABLET | Freq: Every day | ORAL | 11 refills | Status: DC
Start: 2019-10-12 — End: 2020-06-02

## 2019-10-12 NOTE — Progress Notes (Signed)
Cardiology Office Note:    Date:  10/12/2019   ID:  Nicole Meyer, DOB [birth year], MRN [id number]  PCP:  [name], MD  [facility] Cardiologist:  [facility] Electrophysiologist:  None   Referring MD: [name], MD   Chief Complaint  Patient presents with  . Coronary Artery Disease        Nicole Meyer is a 79 y.o. female with a hx of DOE for the past 6 months or so .  She has a hx of a CVA , hyperlipidemia   typcially very active.   Feeds her feral cats.  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  The DOE has worsened gradually  Watches her salt to some degree.  Occasional chest pain ,  Worse with exertion.   Worse with walking  Is associated with the DOE .  No diaphoresis,  No dizziness.   + family hx of CAD Mother and father , both grandfathers had CAD Grandmother had a CVA  She had a heart cath by Cherly Hensen, MD about 20 years ago  Echo from Jan. 2020 shows normal LV function with EF 55-60%. Grade 1 diastolic dysfunction   Dr. Marisue Humble changed her BP med to amlodipine which has helped her chest tightness.   Aug. 24, 2021:  Nicole Meyer is seen today for follow up .  She was having episodes of unstable angina when I saw her several months ago.  We referred her for heart catheterization.  She has found to have three-vessel coronary artery disease and had coronary artery bypass grafting. ( LIMA - LAD , SVG - PDA, SVG - OM2, SVG -D1) on August 17, 2019  Recovering well.  Gets tired quickly . Is sore.  Is now on home O2.   Is walking some,   Went to SNF after surgery .      Past Medical History:  Diagnosis Date  . Anxiety   . Arthritis    "knees and back" (02/12/2013)  . Arthritis   . Asthma    no problems recently  . Blood dyscrasia    sperocytosis  . Cancer ([place]) 04-06-12   skin cancer nasal bridge-no problems now  . Chronic kidney disease (CKD), stage III (moderate)   . COPD (chronic obstructive pulmonary disease) ([place])    . Coronary artery disease   . Depression   . Diverticulitis   . Fatigue   . GERD (gastroesophageal reflux disease)   . Gout 04/05/2013  . H/O hiatal hernia   . History of blood transfusion    "2 units in 12/2012; suppose to get 1 unit today" (02/12/2013)  . HTN (hypertension)   . Hyperbilirubinemia 05/03/2013  . Hypercalcemia   . Hypercholesteremia    "above borderline; I don't take RX for it" (12/26/2011)  . IBS (irritable bowel syndrome)    ibs  . Iron deficiency anemia    [facility]  . Leukocytopenia   . Lumbar spinal stenosis   . Lymphopenia 08/23/2011  . Osteoarthritis   . Osteoporosis   . PMR (polymyalgia rheumatica) (HCC)    pmr  . Renal insufficiency   . Syncope and collapse 12/24/2011   "loss of consciousness for 3-4 min" (12/26/2011)  . UTI (urinary tract infection) 12/26/2011    Past Surgical History:  Procedure Laterality Date  . APPENDECTOMY  80  . arthroscopic knee surgery-left    . BREAST EXCISIONAL BIOPSY Right 1995  . BREAST SURGERY  04-06-12  rt. lumpectomy_benign  . CARDIAC CATHETERIZATION  08/12/2019  . carpal tunnel right    . COLONOSCOPY, ESOPHAGOGASTRODUODENOSCOPY (EGD) AND ESOPHAGEAL DILATION     06/2013  . CORONARY ARTERY BYPASS GRAFT N/A 08/17/2019   Procedure: CORONARY ARTERY BYPASS GRAFTING (CABG) x 4 using LIMA to LAD(d) and endscopic right greater saphenous vein harvest: SVG to Diag1; SVG to OM1; SVG to PDA. Flowtrack: Only;  Surgeon: [name], MD;  Location: [facility];  Service: Open Heart Surgery;  Laterality: N/A;  . ENDOVEIN HARVEST OF GREATER SAPHENOUS VEIN Right 08/17/2019   Procedure: ENDOVEIN HARVEST OF GREATER SAPHENOUS VEIN;  Surgeon: [name], MD;  Location: [place];  Service: Open Heart Surgery;  Laterality: Right;  . ESOPHAGOGASTRODUODENOSCOPY  12/26/2011   Procedure: ESOPHAGOGASTRODUODENOSCOPY (EGD);  Surgeon: Inda Castle, MD;  Location: [place];  Service: Endoscopy;  Laterality:  N/A;  . EYE SURGERY     cataracts  . HERNIA REPAIR  1970's   "stomach"   . LAPAROSCOPIC NISSEN FUNDOPLICATION N/A 0/25/8527   Procedure: Laparoscopic Reduction and Repair of Paraesophagel Hiatal Hernia with Nissen;  Surgeon: Adin Hector, MD;  Location: [facility];  Service: General;  Laterality: N/A;  Laparoscopic Reduction and Repair of Paraesophagel Hiatal Hernia with Nissen  . LEFT HEART CATH AND CORONARY ANGIOGRAPHY N/A 08/12/2019   Procedure: LEFT HEART CATH AND CORONARY ANGIOGRAPHY;  Surgeon: Jettie Booze, MD;  Location: [place] [facility];  Service: Cardiovascular;  Laterality: N/A;  . NISSEN FUNDOPLICATION     08/8240  . TEE WITHOUT CARDIOVERSION N/A 08/17/2019   Procedure: TRANSESOPHAGEAL ECHOCARDIOGRAM (TEE);  Surgeon: [name], MD;  Location: [place];  Service: Open Heart Surgery;  Laterality: N/A;  . TOTAL KNEE ARTHROPLASTY Right 05/19/2015   Procedure: RIGHT TOTAL KNEE ARTHROPLASTY;  Surgeon: Netta Cedars, MD;  Location: [facility];  Service: Orthopedics;  Laterality: Right;  . TUBAL LIGATION  1970's  . VAGINAL HYSTERECTOMY  ~ 1980   right ovary    Current Medications: Current Meds  Medication Sig  . acetaminophen (TYLENOL) 325 MG tablet Take 650 mg by mouth every 6 (six) hours as needed for mild pain or moderate pain.  [name] albuterol (PROVENTIL HFA;VENTOLIN HFA) 108 (90 Base) MCG/ACT inhaler Inhale 2 puffs into the lungs every 6 (six) hours as needed for wheezing or shortness of breath.  [name] amLODipine (NORVASC) 10 MG tablet Take 1 tablet (10 mg total) by mouth daily.  . bisacodyl (DULCOLAX) 10 MG suppository If not relieved by MOM, give 10 mg Bisacodyl suppositiory rectally X 1 dose in 24 hours as needed (Do not use constipation standing orders for residents with renal failure/CFR less than 30. Contact MD for orders) (Physician Order)  . Calcium Carbonate-Vitamin D3 (CALCIUM 600/VITAMIN D) 600-400 MG-UNIT TABS Take 2 tablets by mouth daily.  . cyanocobalamin 500 MCG  tablet Take 500 mcg by mouth daily after breakfast. Vitamin P53  . folic acid (FOLVITE) 614 MCG tablet Take 400 mcg by mouth daily after breakfast.   . magnesium hydroxide (MILK OF MAGNESIA) 400 MG/5ML suspension If no BM in 3 days, give 30 cc Milk of Magnesium p.o. x 1 dose in 24 hours as needed (Do not use standing constipation orders for residents with renal failure CFR less than 30. Contact MD for orders) (Physician Order)  . Menthol, Topical Analgesic, (ICY HOT) 7.5 % (Roll) MISC Apply 1 each topically daily as needed (arthritis pain).  . metoprolol tartrate (LOPRESSOR) 50 MG tablet Take 1 tablet (50 mg  total) by mouth 2 (two) times daily.  . Multiple Vitamins-Minerals (PRESERVISION AREDS) TABS Take 1 tablet by mouth daily.  . NON FORMULARY Heart healthy diet  . NON FORMULARY Give 170mls Med-Pass Q daily to help prevent weight loss  . NON FORMULARY Give one Magic Cup Q daily to help prevent weight loss  . ondansetron (ZOFRAN) 4 MG tablet Take 4 mg by mouth every 8 (eight) hours as needed for nausea or vomiting.  . predniSONE (DELTASONE) 5 MG tablet Take 1 tablet (5 mg total) by mouth daily after breakfast.  . rosuvastatin (CRESTOR) 20 MG tablet Take 1 tablet (20 mg total) by mouth daily.  . Sodium Phosphates (RA SALINE ENEMA RE) Place rectally. If not relieved by Biscodyl suppository, give disposable Saline Enema rectally X 1 dose/24 hrs as needed (Do not use constipation standing orders for residents with renal failure/CFR less than 30. Contact MD for orders)(Physician Or  . [DISCONTINUED] clopidogrel (PLAVIX) 75 MG tablet Take 75 mg by mouth daily.     Allergies:   Penicillins, Brovana [arformoterol], and Paxil [paroxetine hcl]   Social History   Socioeconomic History  . Marital status: Married    Spouse name: Not on file  . Number of children: 2  . Years of education: Not on file  . Highest education level: Not on file  Occupational History    Comment: retired Interior and spatial designer  Tobacco Use  . Smoking status: Former Smoker    Packs/day: 1.00    Years: 40.00    Pack years: 40.00    Types: Cigarettes    Quit date: 02/19/2000    Years since quitting: 19.6  . Smokeless tobacco: Never Used  Vaping Use  . Vaping Use: Never used  Substance and Sexual Activity  . Alcohol use: No  . Drug use: No  . Sexual activity: Not Currently    Birth control/protection: Post-menopausal  Other Topics Concern  . Not on file  Social History Narrative  . Not on file   Social Determinants of Health   Financial Resource Strain:   . Difficulty of Paying Living Expenses: Not on file  Food Insecurity:   . Worried About Charity fundraiser in the Last Year: Not on file  . Ran Out of Food in the Last Year: Not on file  Transportation Needs:   . Lack of Transportation (Medical): Not on file  . Lack of Transportation (Non-Medical): Not on file  Physical Activity:   . Days of Exercise per Week: Not on file  . Minutes of Exercise per Session: Not on file  Stress:   . Feeling of Stress : Not on file  Social Connections:   . Frequency of Communication with Friends and Family: Not on file  . Frequency of Social Gatherings with Friends and Family: Not on file  . Attends Religious Services: Not on file  . Active Member of Clubs or Organizations: Not on file  . Attends Archivist Meetings: Not on file  . Marital Status: Not on file     Family History: The patient's family history includes Breast cancer in her maternal aunt; Diabetes in her father; Heart disease in her father.  ROS:   Please see the history of present illness.     All other systems reviewed and are negative.  EKGs/Labs/Other Studies Reviewed:    The following studies were reviewed today:   EKG:    Recent Labs: 08/16/2019: ALT 14 08/18/2019: Magnesium 2.5 09/09/2019: BUN 17; Creatinine  0.9; Hemoglobin 12.0; Platelets 169; Potassium 3.8; Sodium 137  Recent Lipid Panel    Component Value  Date/Time   CHOL 97 08/30/2019 0000   TRIG 163 (A) 08/30/2019 0000   HDL 29 (A) 08/30/2019 0000   CHOLHDL 2.3 08/14/2019 0837   VLDL 27 08/14/2019 0837   LDLCALC 35 08/30/2019 0000    Physical Exam:    Physical Exam: Blood pressure 140/70, pulse 66, height 5\' 2"  (1.575 m), weight 155 lb 3.2 oz (70.4 kg), SpO2 95 %.  GEN:  Well nourished, well developed in no acute distress HEENT: Normal NECK: No JVD; No carotid bruits LYMPHATICS: No lymphadenopathy CARDIAC: RRR , no murmurs, rubs, gallops.  Sternotomy is well healed,  thorocostomy sites are healed.  RESPIRATORY:  Clear to auscultation without rales, wheezing or rhonchi  ABDOMEN: Soft, non-tender, non-distended MUSCULOSKELETAL:  No edema; No deformity  SKIN: Warm and dry NEUROLOGIC:  Alert and oriented x 3    ASSESSMENT:    1. S/P CABG x 4   2. Coronary artery disease involving native coronary artery of native heart without angina pectoris   3. Essential hypertension   4. Mixed hyperlipidemia   5. PAD (peripheral artery disease) (HCC)    PLAN:    In order of problems listed above:  Unstable angina:  S/p CABG.   Healing well.   Still gets short of breath with waking   2.  Hyperlipidemia:   Cont meds  3.  Hypertension:  BP is mildly elevated.  Start HCTZ 25 mg a day ,  Kdur 10 meq - 2 tabs daily . Bmp in 3 weeks Follow up with APP in 3 months    Medication Adjustments/Labs and Tests Ordered: Current medicines are reviewed at length with the patient today.  Concerns regarding medicines are outlined above.  Orders Placed This Encounter  Procedures  . Basic Metabolic Panel (BMET)   Meds ordered this encounter  Medications  . hydrochlorothiazide (HYDRODIURIL) 25 MG tablet    Sig: Take 1 tablet (25 mg total) by mouth daily.    Dispense:  30 tablet    Refill:  11  . potassium chloride (KLOR-CON) 10 MEQ tablet    Sig: Take 2 tablets (20 mEq total) by mouth daily.    Dispense:  60 tablet    Refill:  11     Patient Instructions  Medication Instructions:  Your physician has recommended you make the following change in your medication:  STOP Clopidogrel (Plavix) STOP Aspirin after September 29 START HCTZ (Hydrochlorothiazide) 25 mg once daily in the morning START Kdur (Potassium chloride) 20 mEq (2 10 mEq tabs) once daily  *If you need a refill on your cardiac medications before your next appointment, please call your pharmacy*   Lab Work: Your physician recommends that you return for lab work in: 3 weeks for basic metabolic panel  If you have labs (blood work) drawn today and your tests are completely normal, you will receive your results only by: [name] MyChart Message (if you have MyChart) OR . A paper copy in the mail If you have any lab test that is abnormal or we need to change your treatment, we will call you to review the results.   Testing/Procedures: None Ordered   Follow-Up: At [facility], you and your health needs are our priority.  As part of our continuing mission to provide you with exceptional heart care, we have created designated Provider Care Teams.  These Care Teams include your primary Cardiologist (physician) and  Advanced Practice Providers (APPs -  Physician Assistants and Nurse Practitioners) who all work together to provide you with the care you need, when you need it.   Your next appointment:   3 month(s)  The format for your next appointment:   In Person  Provider:   You may see Mertie Moores, MD or one of the following Advanced Practice Providers on your designated Care Team:    Richardson Dopp, PA-C  [place], [place]    Other Instructions  For your  leg edema you  should do  the following 1. Leg elevation - I recommend the Lounge Dr. Leg rest.  See below for details  2. Salt restriction  -  Use potassium chloride instead of regular salt as a salt substitute. 3. Walk regularly 4. Compression hose - [facility] 5. Weight loss     Available on [email or web address] Or  Go to [email or web address]         Signed, Mertie Moores, MD  10/12/2019 10:29 AM    [facility]

## 2019-10-12 NOTE — Patient Instructions (Addendum)
Medication Instructions:  Your physician has recommended you make the following change in your medication:  STOP Clopidogrel (Plavix) STOP Aspirin after September 29 START HCTZ (Hydrochlorothiazide) 25 mg once daily in the morning START Kdur (Potassium chloride) 20 mEq (2 10 mEq tabs) once daily  *If you need a refill on your cardiac medications before your next appointment, please call your pharmacy*   Lab Work: Your physician recommends that you return for lab work in: 3 weeks for basic metabolic panel  If you have labs (blood work) drawn today and your tests are completely normal, you will receive your results only by: [name] MyChart Message (if you have MyChart) OR . A paper copy in the mail If you have any lab test that is abnormal or we need to change your treatment, we will call you to review the results.   Testing/Procedures: None Ordered   Follow-Up: At [facility], you and your health needs are our priority.  As part of our continuing mission to provide you with exceptional heart care, we have created designated Provider Care Teams.  These Care Teams include your primary Cardiologist (physician) and Advanced Practice Providers (APPs -  Physician Assistants and Nurse Practitioners) who all work together to provide you with the care you need, when you need it.   Your next appointment:   3 month(s)  The format for your next appointment:   In Person  Provider:   You may see Mertie Moores, MD or one of the following Advanced Practice Providers on your designated Care Team:    Richardson Dopp, PA-C  [place], [place]    Other Instructions  For your  leg edema you  should do  the following 1. Leg elevation - I recommend the Lounge Dr. Leg rest.  See below for details  2. Salt restriction  -  Use potassium chloride instead of regular salt as a salt substitute. 3. Walk regularly 4. Compression hose - [facility] 5. Weight loss    Available on [email or web address] Or    Go to [email or web address]

## 2019-10-14 ENCOUNTER — Telehealth (HOSPITAL_COMMUNITY): Payer: Self-pay | Admitting: *Deleted

## 2019-10-14 NOTE — Telephone Encounter (Signed)
-----   Message from [place], MD sent at 10/14/2019  6:41 AM EDT ----- Regarding: RE: ok to titrate O2 therapy at cardiac rehab Yes, Please continue home O2 and titrate to maintain O2 sat of > 90% during exercise    Mertie Moores, MD  10/14/2019 [facility] [address]  [place], [place]  [address] Phone: [phone]; Fax: [phone]    ----- Message ----- From: Rowe Pavy, RN Sent: 10/13/2019   3:23 PM EDT To: [place] Head[place], MD Subject: ok to titrate O2 therapy at cardiac rehab      Dr. Acie Fredrickson,  The above pt s.p CABG x 4 on 6/29 completed her follow up with you on 8/24.  Noted the progress note, pt uses oxygen at home.  What is an acceptable o2 saturation for this pt?   Okay to titrate to maintain this saturation?   Thank you for the advisement.  Cherre Huger, BSN Cardiac and Training and development officer

## 2019-10-27 ENCOUNTER — Encounter (HOSPITAL_COMMUNITY): Payer: Self-pay

## 2019-10-27 ENCOUNTER — Telehealth (HOSPITAL_COMMUNITY): Payer: Self-pay

## 2019-10-27 NOTE — Telephone Encounter (Signed)
Attempted to call patient in regards to Cardiac Rehab - LM on VM Mailed letter 

## 2019-11-05 ENCOUNTER — Other Ambulatory Visit: Payer: Self-pay

## 2019-11-05 ENCOUNTER — Other Ambulatory Visit: Payer: Medicare PPO | Admitting: *Deleted

## 2019-11-05 DIAGNOSIS — I739 Peripheral vascular disease, unspecified: Secondary | ICD-10-CM | POA: Diagnosis not present

## 2019-11-05 DIAGNOSIS — I10 Essential (primary) hypertension: Secondary | ICD-10-CM | POA: Diagnosis not present

## 2019-11-05 DIAGNOSIS — Z951 Presence of aortocoronary bypass graft: Secondary | ICD-10-CM | POA: Diagnosis not present

## 2019-11-05 DIAGNOSIS — E782 Mixed hyperlipidemia: Secondary | ICD-10-CM | POA: Diagnosis not present

## 2019-11-05 DIAGNOSIS — I2510 Atherosclerotic heart disease of native coronary artery without angina pectoris: Secondary | ICD-10-CM

## 2019-11-05 LAB — BASIC METABOLIC PANEL
BUN/Creatinine Ratio: 13 (ref 12–28)
BUN: 15 mg/dL (ref 8–27)
CO2: 23 mmol/L (ref 20–29)
Calcium: 9.5 mg/dL (ref 8.7–10.3)
Chloride: 103 mmol/L (ref 96–106)
Creatinine, Ser: 1.16 mg/dL — ABNORMAL HIGH (ref 0.57–1.00)
GFR calc Af Amer: 51 mL/min/{1.73_m2} — ABNORMAL LOW (ref >59)
GFR calc non Af Amer: 45 mL/min/{1.73_m2} — ABNORMAL LOW (ref >59)
Glucose: 89 mg/dL (ref 65–99)
Potassium: 4.1 mmol/L (ref 3.5–5.2)
Sodium: 140 mmol/L (ref 134–144)

## 2019-11-11 ENCOUNTER — Telehealth (HOSPITAL_COMMUNITY): Payer: Self-pay

## 2019-11-11 DIAGNOSIS — R0902 Hypoxemia: Secondary | ICD-10-CM | POA: Diagnosis not present

## 2019-11-11 DIAGNOSIS — R062 Wheezing: Secondary | ICD-10-CM | POA: Diagnosis not present

## 2019-11-11 NOTE — Telephone Encounter (Signed)
No response from pt regarding CR.  Closed referral.  

## 2019-12-11 DIAGNOSIS — R062 Wheezing: Secondary | ICD-10-CM | POA: Diagnosis not present

## 2019-12-11 DIAGNOSIS — R0902 Hypoxemia: Secondary | ICD-10-CM | POA: Diagnosis not present

## 2020-01-05 ENCOUNTER — Encounter: Payer: Self-pay | Admitting: Cardiovascular Disease

## 2020-01-05 NOTE — Progress Notes (Signed)
This encounter was created in error - please disregard.

## 2020-01-07 ENCOUNTER — Encounter: Payer: Medicare PPO | Admitting: Cardiovascular Disease

## 2020-01-08 DIAGNOSIS — J112 Influenza due to unidentified influenza virus with gastrointestinal manifestations: Secondary | ICD-10-CM | POA: Diagnosis not present

## 2020-01-08 DIAGNOSIS — R112 Nausea with vomiting, unspecified: Secondary | ICD-10-CM | POA: Diagnosis not present

## 2020-01-11 DIAGNOSIS — R062 Wheezing: Secondary | ICD-10-CM | POA: Diagnosis not present

## 2020-01-11 DIAGNOSIS — R0902 Hypoxemia: Secondary | ICD-10-CM | POA: Diagnosis not present

## 2020-02-10 DIAGNOSIS — R0902 Hypoxemia: Secondary | ICD-10-CM | POA: Diagnosis not present

## 2020-02-10 DIAGNOSIS — R062 Wheezing: Secondary | ICD-10-CM | POA: Diagnosis not present

## 2020-03-12 DIAGNOSIS — R0902 Hypoxemia: Secondary | ICD-10-CM | POA: Diagnosis not present

## 2020-03-12 DIAGNOSIS — R062 Wheezing: Secondary | ICD-10-CM | POA: Diagnosis not present

## 2020-04-12 DIAGNOSIS — R062 Wheezing: Secondary | ICD-10-CM | POA: Diagnosis not present

## 2020-04-12 DIAGNOSIS — R0902 Hypoxemia: Secondary | ICD-10-CM | POA: Diagnosis not present

## 2020-05-10 DIAGNOSIS — R0902 Hypoxemia: Secondary | ICD-10-CM | POA: Diagnosis not present

## 2020-05-10 DIAGNOSIS — R062 Wheezing: Secondary | ICD-10-CM | POA: Diagnosis not present

## 2020-05-22 DIAGNOSIS — E7800 Pure hypercholesterolemia, unspecified: Secondary | ICD-10-CM | POA: Diagnosis not present

## 2020-05-22 DIAGNOSIS — H40033 Anatomical narrow angle, bilateral: Secondary | ICD-10-CM | POA: Diagnosis not present

## 2020-05-22 DIAGNOSIS — R7301 Impaired fasting glucose: Secondary | ICD-10-CM | POA: Diagnosis not present

## 2020-05-22 DIAGNOSIS — Z1389 Encounter for screening for other disorder: Secondary | ICD-10-CM | POA: Diagnosis not present

## 2020-05-22 DIAGNOSIS — M353 Polymyalgia rheumatica: Secondary | ICD-10-CM | POA: Diagnosis not present

## 2020-05-22 DIAGNOSIS — I129 Hypertensive chronic kidney disease with stage 1 through stage 4 chronic kidney disease, or unspecified chronic kidney disease: Secondary | ICD-10-CM | POA: Diagnosis not present

## 2020-05-22 DIAGNOSIS — H43393 Other vitreous opacities, bilateral: Secondary | ICD-10-CM | POA: Diagnosis not present

## 2020-05-22 DIAGNOSIS — Z0000 Encounter for general adult medical examination without abnormal findings: Secondary | ICD-10-CM | POA: Diagnosis not present

## 2020-05-22 DIAGNOSIS — N183 Chronic kidney disease, stage 3 unspecified: Secondary | ICD-10-CM | POA: Diagnosis not present

## 2020-05-22 DIAGNOSIS — M1990 Unspecified osteoarthritis, unspecified site: Secondary | ICD-10-CM | POA: Diagnosis not present

## 2020-05-22 DIAGNOSIS — D649 Anemia, unspecified: Secondary | ICD-10-CM | POA: Diagnosis not present

## 2020-06-01 ENCOUNTER — Encounter: Payer: Self-pay | Admitting: Cardiovascular Disease

## 2020-06-01 NOTE — Progress Notes (Signed)
Cardiology Office Note:    Date:  06/02/2020   ID:  Nicole Meyer, DOB 11-17-1940, MRN [id number]  PCP:  [name], MD  [facility] Cardiologist:  [facility] Electrophysiologist:  None   Referring MD: [name], MD   Chief Complaint  Patient presents with  . Coronary Artery Disease        Nicole Meyer is a 80 y.o. female with a hx of DOE for the past 6 months or so .  She has a hx of a CVA , hyperlipidemia   typcially very active.   Feeds her feral cats.  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  The DOE has worsened gradually  Watches her salt to some degree.  Occasional chest pain ,  Worse with exertion.   Worse with walking  Is associated with the DOE .  No diaphoresis,  No dizziness.   + family hx of CAD Mother and father , both grandfathers had CAD Grandmother had a CVA  She had a heart cath by Cherly Hensen, MD about 20 years ago  Echo from Jan. 2020 shows normal LV function with EF 55-60%. Grade 1 diastolic dysfunction   Dr. Marisue Humble changed her BP med to amlodipine which has helped her chest tightness.   Aug. 24, 2021:  Nicole Meyer is seen today for follow up .  She was having episodes of unstable angina when I saw her several months ago.  We referred her for heart catheterization.  She has found to have three-vessel coronary artery disease and had coronary artery bypass grafting. ( LIMA - LAD , SVG - PDA, SVG - OM2, SVG -D1) on August 17, 2019  Recovering well.  Gets tired quickly . Is sore.  Is now on home O2.   Is walking some,   Went to SNF after surgery .   June 02, 2020: Nicole Meyer is seen for follow up of her CAD,  She is s/p CABG ,   ( LIMA - LAD , SVG - PDA, SVG - OM2, SVG -D1) on August 17, 2019, Her presenting symptoms ws worsening dyspnea ,  Her dyspnea has improved   Stays so tired.  Does not do any exercise .  Has discussed with Dr. Marisue Humble.   Shops once a week .  Gets worn out shopping  Her creatinine was 0.9  and was normal prior to to her heart cath and bypass surgery.  Her last creatinine is 1.8.   Her HCTZ was stopped .   Her Kdur was not stopped.  She drinks water regulaly      Past Medical History:  Diagnosis Date  . Anxiety   . Arthritis    "knees and back" (02/12/2013)  . Arthritis   . Asthma    no problems recently  . Blood dyscrasia    sperocytosis  . Cancer ([place]) 04-06-12   skin cancer nasal bridge-no problems now  . Chronic kidney disease (CKD), stage III (moderate) (HCC)   . COPD (chronic obstructive pulmonary disease) ([place])   . Coronary artery disease   . Depression   . Diverticulitis   . Fatigue   . GERD (gastroesophageal reflux disease)   . Gout 04/05/2013  . H/O hiatal hernia   . History of blood transfusion    "2 units in 12/2012; suppose to get 1 unit today" (02/12/2013)  . HTN (hypertension)   . Hyperbilirubinemia 05/03/2013  . Hypercalcemia   . Hypercholesteremia    "above borderline; I don't  take RX for it" (12/26/2011)  . IBS (irritable bowel syndrome)    ibs  . Iron deficiency anemia    [facility]  . Leukocytopenia   . Lumbar spinal stenosis   . Lymphopenia 08/23/2011  . Osteoarthritis   . Osteoporosis   . PMR (polymyalgia rheumatica) (HCC)    pmr  . Renal insufficiency   . Syncope and collapse 12/24/2011   "loss of consciousness for 3-4 min" (12/26/2011)  . UTI (urinary tract infection) 12/26/2011    Past Surgical History:  Procedure Laterality Date  . APPENDECTOMY  80  . arthroscopic knee surgery-left    . BREAST EXCISIONAL BIOPSY Right 1995  . BREAST SURGERY  04-06-12   rt. lumpectomy_benign  . CARDIAC CATHETERIZATION  08/12/2019  . carpal tunnel right    . COLONOSCOPY, ESOPHAGOGASTRODUODENOSCOPY (EGD) AND ESOPHAGEAL DILATION     06/2013  . CORONARY ARTERY BYPASS GRAFT N/A 08/17/2019   Procedure: CORONARY ARTERY BYPASS GRAFTING (CABG) x 4 using LIMA to LAD(d) and endscopic right greater saphenous vein harvest: SVG to  Diag1; SVG to OM1; SVG to PDA. Flowtrack: Only;  Surgeon: [name], MD;  Location: [place];  Service: Open Heart Surgery;  Laterality: N/A;  . ENDOVEIN HARVEST OF GREATER SAPHENOUS VEIN Right 08/17/2019   Procedure: ENDOVEIN HARVEST OF GREATER SAPHENOUS VEIN;  Surgeon: [name], MD;  Location: [place];  Service: Open Heart Surgery;  Laterality: Right;  . ESOPHAGOGASTRODUODENOSCOPY  12/26/2011   Procedure: ESOPHAGOGASTRODUODENOSCOPY (EGD);  Surgeon: Inda Castle, MD;  Location: [place];  Service: Endoscopy;  Laterality: N/A;  . EYE SURGERY     cataracts  . HERNIA REPAIR  1970's   "stomach"   . LAPAROSCOPIC NISSEN FUNDOPLICATION N/A 9/56/2130   Procedure: Laparoscopic Reduction and Repair of Paraesophagel Hiatal Hernia with Nissen;  Surgeon: Adin Hector, MD;  Location: [facility];  Service: General;  Laterality: N/A;  Laparoscopic Reduction and Repair of Paraesophagel Hiatal Hernia with Nissen  . LEFT HEART CATH AND CORONARY ANGIOGRAPHY N/A 08/12/2019   Procedure: LEFT HEART CATH AND CORONARY ANGIOGRAPHY;  Surgeon: Jettie Booze, MD;  Location: [place] [facility];  Service: Cardiovascular;  Laterality: N/A;  . NISSEN FUNDOPLICATION     09/6576  . TEE WITHOUT CARDIOVERSION N/A 08/17/2019   Procedure: TRANSESOPHAGEAL ECHOCARDIOGRAM (TEE);  Surgeon: [name], MD;  Location: [place];  Service: Open Heart Surgery;  Laterality: N/A;  . TOTAL KNEE ARTHROPLASTY Right 05/19/2015   Procedure: RIGHT TOTAL KNEE ARTHROPLASTY;  Surgeon: Netta Cedars, MD;  Location: [place];  Service: Orthopedics;  Laterality: Right;  . TUBAL LIGATION  1970's  . VAGINAL HYSTERECTOMY  ~ 1980   right ovary    Current Medications: Current Meds  Medication Sig  . acetaminophen (TYLENOL) 325 MG tablet Take 650 mg by mouth every 6 (six) hours as needed for mild pain or moderate pain.  [name] albuterol (PROVENTIL HFA;VENTOLIN HFA) 108 (90 Base) MCG/ACT inhaler Inhale 2 puffs into the lungs every 6  (six) hours as needed for wheezing or shortness of breath.  . bisacodyl (DULCOLAX) 10 MG suppository If not relieved by MOM, give 10 mg Bisacodyl suppositiory rectally X 1 dose in 24 hours as needed (Do not use constipation standing orders for residents with renal failure/CFR less than 30. Contact MD for orders) (Physician Order)  . Calcium Carbonate-Vitamin D3 600-400 MG-UNIT TABS Take 2 tablets by mouth daily.  . cyanocobalamin 500 MCG tablet Take 500 mcg by mouth daily  after breakfast. Vitamin O53  . folic acid (FOLVITE) 664 MCG tablet Take 400 mcg by mouth daily after breakfast.   . isosorbide dinitrate (ISORDIL) 30 MG tablet Take 30 mg by mouth daily.  . Menthol, Topical Analgesic, (ICY HOT) 7.5 % (Roll) MISC Apply 1 each topically daily as needed (arthritis pain).  . metoprolol succinate (TOPROL-XL) 25 MG 24 hr tablet Take 25 mg by mouth daily.  . Multiple Vitamins-Minerals (PRESERVISION AREDS) TABS Take 1 tablet by mouth daily.  . NON FORMULARY Heart healthy diet  . ondansetron (ZOFRAN) 4 MG tablet Take 4 mg by mouth every 8 (eight) hours as needed for nausea or vomiting.  . predniSONE (DELTASONE) 5 MG tablet Take 1 tablet (5 mg total) by mouth daily after breakfast.  . rosuvastatin (CRESTOR) 20 MG tablet Take 1 tablet (20 mg total) by mouth daily.  . Sodium Phosphates (RA SALINE ENEMA RE) Place rectally. If not relieved by Biscodyl suppository, give disposable Saline Enema rectally X 1 dose/24 hrs as needed (Do not use constipation standing orders for residents with renal failure/CFR less than 30. Contact MD for orders)(Physician Or  . [DISCONTINUED] amLODipine (NORVASC) 10 MG tablet Take 1 tablet (10 mg total) by mouth daily.  . [DISCONTINUED] hydrochlorothiazide (HYDRODIURIL) 25 MG tablet Take 1 tablet (25 mg total) by mouth daily.  . [DISCONTINUED] magnesium hydroxide (MILK OF MAGNESIA) 400 MG/5ML suspension If no BM in 3 days, give 30 cc Milk of Magnesium p.o. x 1 dose in 24 hours as  needed (Do not use standing constipation orders for residents with renal failure CFR less than 30. Contact MD for orders) (Physician Order)  . [DISCONTINUED] NON FORMULARY Give 144mls Med-Pass Q daily to help prevent weight loss  . [DISCONTINUED] NON FORMULARY Give one Magic Cup Q daily to help prevent weight loss  . [DISCONTINUED] potassium chloride (KLOR-CON) 10 MEQ tablet Take 2 tablets (20 mEq total) by mouth daily.     Allergies:   Penicillins, Brovana [arformoterol], and Paxil [paroxetine hcl]   Social History   Socioeconomic History  . Marital status: Married    Spouse name: Not on file  . Number of children: 2  . Years of education: Not on file  . Highest education level: Not on file  Occupational History    Comment: retired Automotive engineer  Tobacco Use  . Smoking status: Former Smoker    Packs/day: 1.00    Years: 40.00    Pack years: 40.00    Types: Cigarettes    Quit date: 02/19/2000    Years since quitting: 20.2  . Smokeless tobacco: Never Used  Vaping Use  . Vaping Use: Never used  Substance and Sexual Activity  . Alcohol use: No  . Drug use: No  . Sexual activity: Not Currently    Birth control/protection: Post-menopausal  Other Topics Concern  . Not on file  Social History Narrative  . Not on file   Social Determinants of Health   Financial Resource Strain: Not on file  Food Insecurity: Not on file  Transportation Needs: Not on file  Physical Activity: Not on file  Stress: Not on file  Social Connections: Not on file     Family History: The patient's family history includes Breast cancer in her maternal aunt; Diabetes in her father; Heart disease in her father.  ROS:   Please see the history of present illness.     All other systems reviewed and are negative.  EKGs/Labs/Other Studies Reviewed:    The  following studies were reviewed today:    Recent Labs: 08/16/2019: ALT 14 08/18/2019: Magnesium 2.5 09/09/2019: Hemoglobin 12.0;  Platelets 169 11/05/2019: BUN 15; Creatinine, Ser 1.16; Potassium 4.1; Sodium 140  Recent Lipid Panel    Component Value Date/Time   CHOL 97 08/30/2019 0000   TRIG 163 (A) 08/30/2019 0000   HDL 29 (A) 08/30/2019 0000   CHOLHDL 2.3 08/14/2019 0837   VLDL 27 08/14/2019 0837   LDLCALC 35 08/30/2019 0000    Physical Exam:     Physical Exam: Blood pressure 120/82, pulse 61, height 5' 2.5" (1.588 m), weight 143 lb (64.9 kg), SpO2 95 %.  GEN:  Well nourished, well developed in no acute distress HEENT: Normal NECK: No JVD; No carotid bruits LYMPHATICS: No lymphadenopathy CARDIAC: RRR , no murmurs, rubs, gallops RESPIRATORY:  Clear to auscultation without rales, wheezing or rhonchi  ABDOMEN: Soft, non-tender, non-distended MUSCULOSKELETAL:  No edema; No deformity  SKIN: Warm and dry NEUROLOGIC:  Alert and oriented x 3  EKG:     ASSESSMENT:    1. Fatigue, unspecified type   2. Atherosclerosis of coronary artery bypass graft of native heart with unstable angina pectoris ([place])   3. Coronary artery disease involving native coronary artery of native heart with unstable angina pectoris ([place])   4. Primary hypertension    PLAN:      Unstable angina:  S/p CABG.     her shortness of breath seems to be better.  She still remains very weak.  I would like to get an echocardiogram because of her generalized fatigue and weakness.  We will need to make sure that her LV function has remained normal.   2.  Hyperlipidemia:   Cont meds  3.  Hypertension: Blood pressures fairly well controlled.  She is developed chronic renal insufficiency since last year.  Her previous creatinine was 0.9.  Is now 1.8.  Her HCTZ was stopped.  Her potassium was continued but we will stop it today.  I will check a basic metabolic profile today.  4.  Generalized weakness and fatigue.  She has absolutely no energy.  We will check a TSH, CBC, basic metabolic profile.    Medication Adjustments/Labs and Tests  Ordered: Current medicines are reviewed at length with the patient today.  Concerns regarding medicines are outlined above.  Orders Placed This Encounter  Procedures  . Basic metabolic panel  . CBC  . TSH  . ECHOCARDIOGRAM COMPLETE   No orders of the defined types were placed in this encounter.   Patient Instructions  Medication Instructions:  Your physician has recommended you make the following change in your medication:   STOP potassium  *If you need a refill on your cardiac medications before your next appointment, please call your pharmacy*   Lab Work: TODAY:CBC, BMP, TSH If you have labs (blood work) drawn today and your tests are completely normal, you will receive your results only by: [name] MyChart Message (if you have MyChart) OR . A paper copy in the mail If you have any lab test that is abnormal or we need to change your treatment, we will call you to review the results.   Testing/Procedures: Your physician has requested that you have an echocardiogram. Echocardiography is a painless test that uses sound waves to create images of your heart. It provides your doctor with information about the size and shape of your heart and how well your heart's chambers and valves are working. This procedure takes approximately one hour. There  are no restrictions for this procedure.   Follow-Up: At [facility], you and your health needs are our priority.  As part of our continuing mission to provide you with exceptional heart care, we have created designated Provider Care Teams.  These Care Teams include your primary Cardiologist (physician) and Advanced Practice Providers (APPs -  Physician Assistants and Nurse Practitioners) who all work together to provide you with the care you need, when you need it.   Your next appointment:   6 month(s)  The format for your next appointment:   In Person  Provider:   You will see one of the following Advanced Practice Providers on your  designated Care Team:    Richardson Dopp, PA-C  Robbie Lis, [place]       Signed, Mertie Moores, MD  06/02/2020 12:11 PM    [place]

## 2020-06-02 ENCOUNTER — Ambulatory Visit: Payer: Medicare PPO | Admitting: Cardiovascular Disease

## 2020-06-02 ENCOUNTER — Encounter: Payer: Self-pay | Admitting: Cardiovascular Disease

## 2020-06-02 ENCOUNTER — Other Ambulatory Visit: Payer: Self-pay

## 2020-06-02 VITALS — BP 120/82 | HR 61 | Ht 62.5 in | Wt 143.0 lb

## 2020-06-02 DIAGNOSIS — I10 Essential (primary) hypertension: Secondary | ICD-10-CM

## 2020-06-02 DIAGNOSIS — R5383 Other fatigue: Secondary | ICD-10-CM | POA: Diagnosis not present

## 2020-06-02 DIAGNOSIS — I25700 Atherosclerosis of coronary artery bypass graft(s), unspecified, with unstable angina pectoris: Secondary | ICD-10-CM

## 2020-06-02 DIAGNOSIS — I25110 Atherosclerotic heart disease of native coronary artery with unstable angina pectoris: Secondary | ICD-10-CM | POA: Diagnosis not present

## 2020-06-02 NOTE — Patient Instructions (Signed)
Medication Instructions:  Your physician has recommended you make the following change in your medication:   STOP potassium  *If you need a refill on your cardiac medications before your next appointment, please call your pharmacy*   Lab Work: TODAY:CBC, BMP, TSH If you have labs (blood work) drawn today and your tests are completely normal, you will receive your results only by: [name] MyChart Message (if you have MyChart) OR . A paper copy in the mail If you have any lab test that is abnormal or we need to change your treatment, we will call you to review the results.   Testing/Procedures: Your physician has requested that you have an echocardiogram. Echocardiography is a painless test that uses sound waves to create images of your heart. It provides your doctor with information about the size and shape of your heart and how well your heart's chambers and valves are working. This procedure takes approximately one hour. There are no restrictions for this procedure.   Follow-Up: At [facility], you and your health needs are our priority.  As part of our continuing mission to provide you with exceptional heart care, we have created designated Provider Care Teams.  These Care Teams include your primary Cardiologist (physician) and Advanced Practice Providers (APPs -  Physician Assistants and Nurse Practitioners) who all work together to provide you with the care you need, when you need it.   Your next appointment:   6 month(s)  The format for your next appointment:   In Person  Provider:   You will see one of the following Advanced Practice Providers on your designated Care Team:    Richardson Dopp, PA-C  Vin Bassett, [place]

## 2020-06-03 LAB — CBC
Hematocrit: 34.7 % (ref 34.0–46.6)
Hemoglobin: 11.2 g/dL (ref 11.1–15.9)
MCH: 29.6 pg (ref 26.6–33.0)
MCHC: 32.3 g/dL (ref 31.5–35.7)
MCV: 92 fL (ref 79–97)
Platelets: 210 10*3/uL (ref 150–450)
RBC: 3.78 x10E6/uL (ref 3.77–5.28)
RDW: 13.1 % (ref 11.7–15.4)
WBC: 5.9 10*3/uL (ref 3.4–10.8)

## 2020-06-03 LAB — TSH: TSH: 2.93 u[IU]/mL (ref 0.450–4.500)

## 2020-06-03 LAB — BASIC METABOLIC PANEL
BUN/Creatinine Ratio: 11 — ABNORMAL LOW (ref 12–28)
BUN: 17 mg/dL (ref 8–27)
CO2: 20 mmol/L (ref 20–29)
Calcium: 9.7 mg/dL (ref 8.7–10.3)
Chloride: 102 mmol/L (ref 96–106)
Creatinine, Ser: 1.58 mg/dL — ABNORMAL HIGH (ref 0.57–1.00)
Glucose: 141 mg/dL — ABNORMAL HIGH (ref 65–99)
Potassium: 4.7 mmol/L (ref 3.5–5.2)
Sodium: 137 mmol/L (ref 134–144)
eGFR: 33 mL/min/{1.73_m2} — ABNORMAL LOW (ref >59)

## 2020-06-10 DIAGNOSIS — R062 Wheezing: Secondary | ICD-10-CM | POA: Diagnosis not present

## 2020-06-10 DIAGNOSIS — R0902 Hypoxemia: Secondary | ICD-10-CM | POA: Diagnosis not present

## 2020-07-07 ENCOUNTER — Other Ambulatory Visit: Payer: Self-pay | Admitting: Family Medicine

## 2020-07-07 ENCOUNTER — Other Ambulatory Visit: Payer: Self-pay

## 2020-07-07 ENCOUNTER — Ambulatory Visit
Admission: RE | Admit: 2020-07-07 | Discharge: 2020-07-07 | Disposition: A | Discharge status: Home / Self Care | Payer: Medicare PPO | Source: Ambulatory Visit | Attending: Family Medicine | Admitting: Family Medicine

## 2020-07-07 ENCOUNTER — Ambulatory Visit (HOSPITAL_COMMUNITY): Payer: Medicare PPO | Attending: Internal Medicine

## 2020-07-07 DIAGNOSIS — M545 Low back pain, unspecified: Secondary | ICD-10-CM | POA: Diagnosis not present

## 2020-07-07 DIAGNOSIS — Z8673 Personal history of transient ischemic attack (TIA), and cerebral infarction without residual deficits: Secondary | ICD-10-CM | POA: Diagnosis not present

## 2020-07-07 DIAGNOSIS — M549 Dorsalgia, unspecified: Secondary | ICD-10-CM | POA: Diagnosis not present

## 2020-07-07 DIAGNOSIS — I25110 Atherosclerotic heart disease of native coronary artery with unstable angina pectoris: Secondary | ICD-10-CM | POA: Diagnosis not present

## 2020-07-07 DIAGNOSIS — Z87891 Personal history of nicotine dependence: Secondary | ICD-10-CM | POA: Diagnosis not present

## 2020-07-07 DIAGNOSIS — N183 Chronic kidney disease, stage 3 unspecified: Secondary | ICD-10-CM | POA: Insufficient documentation

## 2020-07-07 DIAGNOSIS — I2510 Atherosclerotic heart disease of native coronary artery without angina pectoris: Secondary | ICD-10-CM | POA: Diagnosis not present

## 2020-07-07 DIAGNOSIS — E669 Obesity, unspecified: Secondary | ICD-10-CM | POA: Insufficient documentation

## 2020-07-07 DIAGNOSIS — I739 Peripheral vascular disease, unspecified: Secondary | ICD-10-CM | POA: Diagnosis not present

## 2020-07-07 DIAGNOSIS — I1310 Hypertensive heart and chronic kidney disease without heart failure, with stage 1 through stage 4 chronic kidney disease, or unspecified chronic kidney disease: Secondary | ICD-10-CM | POA: Insufficient documentation

## 2020-07-07 DIAGNOSIS — R5383 Other fatigue: Secondary | ICD-10-CM | POA: Diagnosis not present

## 2020-07-07 DIAGNOSIS — D649 Anemia, unspecified: Secondary | ICD-10-CM | POA: Diagnosis not present

## 2020-07-07 DIAGNOSIS — J449 Chronic obstructive pulmonary disease, unspecified: Secondary | ICD-10-CM | POA: Diagnosis not present

## 2020-07-07 LAB — ECHOCARDIOGRAM COMPLETE
Area-P 1/2: 4.68 cm2
S' Lateral: 2.6 cm

## 2020-07-10 DIAGNOSIS — R062 Wheezing: Secondary | ICD-10-CM | POA: Diagnosis not present

## 2020-07-10 DIAGNOSIS — R0902 Hypoxemia: Secondary | ICD-10-CM | POA: Diagnosis not present

## 2020-07-15 ENCOUNTER — Other Ambulatory Visit: Payer: Self-pay

## 2020-07-15 ENCOUNTER — Emergency Department (HOSPITAL_COMMUNITY)
Admission: EM | Admit: 2020-07-15 | Discharge: 2020-07-16 | Disposition: A | Discharge status: Home / Self Care | Payer: Medicare PPO | Attending: Emergency Medicine | Admitting: Emergency Medicine

## 2020-07-15 ENCOUNTER — Emergency Department (HOSPITAL_COMMUNITY): Payer: Medicare PPO

## 2020-07-15 ENCOUNTER — Encounter (HOSPITAL_COMMUNITY): Payer: Self-pay | Admitting: Emergency Medicine

## 2020-07-15 DIAGNOSIS — R112 Nausea with vomiting, unspecified: Secondary | ICD-10-CM | POA: Diagnosis not present

## 2020-07-15 DIAGNOSIS — R21 Rash and other nonspecific skin eruption: Secondary | ICD-10-CM | POA: Diagnosis not present

## 2020-07-15 DIAGNOSIS — N3000 Acute cystitis without hematuria: Secondary | ICD-10-CM

## 2020-07-15 DIAGNOSIS — R059 Cough, unspecified: Secondary | ICD-10-CM | POA: Diagnosis present

## 2020-07-15 DIAGNOSIS — J90 Pleural effusion, not elsewhere classified: Secondary | ICD-10-CM | POA: Diagnosis not present

## 2020-07-15 DIAGNOSIS — I129 Hypertensive chronic kidney disease with stage 1 through stage 4 chronic kidney disease, or unspecified chronic kidney disease: Secondary | ICD-10-CM | POA: Insufficient documentation

## 2020-07-15 DIAGNOSIS — N183 Chronic kidney disease, stage 3 unspecified: Secondary | ICD-10-CM | POA: Insufficient documentation

## 2020-07-15 DIAGNOSIS — R231 Pallor: Secondary | ICD-10-CM | POA: Diagnosis not present

## 2020-07-15 DIAGNOSIS — J45909 Unspecified asthma, uncomplicated: Secondary | ICD-10-CM | POA: Insufficient documentation

## 2020-07-15 DIAGNOSIS — Z79899 Other long term (current) drug therapy: Secondary | ICD-10-CM | POA: Insufficient documentation

## 2020-07-15 DIAGNOSIS — Z96651 Presence of right artificial knee joint: Secondary | ICD-10-CM | POA: Insufficient documentation

## 2020-07-15 DIAGNOSIS — J449 Chronic obstructive pulmonary disease, unspecified: Secondary | ICD-10-CM | POA: Insufficient documentation

## 2020-07-15 DIAGNOSIS — U071 COVID-19: Secondary | ICD-10-CM | POA: Insufficient documentation

## 2020-07-15 DIAGNOSIS — R0602 Shortness of breath: Secondary | ICD-10-CM | POA: Diagnosis not present

## 2020-07-15 DIAGNOSIS — I25110 Atherosclerotic heart disease of native coronary artery with unstable angina pectoris: Secondary | ICD-10-CM | POA: Diagnosis not present

## 2020-07-15 DIAGNOSIS — R531 Weakness: Secondary | ICD-10-CM | POA: Diagnosis not present

## 2020-07-15 DIAGNOSIS — A419 Sepsis, unspecified organism: Secondary | ICD-10-CM | POA: Diagnosis not present

## 2020-07-15 DIAGNOSIS — I959 Hypotension, unspecified: Secondary | ICD-10-CM | POA: Diagnosis not present

## 2020-07-15 DIAGNOSIS — R0902 Hypoxemia: Secondary | ICD-10-CM | POA: Diagnosis not present

## 2020-07-15 DIAGNOSIS — R0689 Other abnormalities of breathing: Secondary | ICD-10-CM | POA: Diagnosis not present

## 2020-07-15 DIAGNOSIS — Z951 Presence of aortocoronary bypass graft: Secondary | ICD-10-CM | POA: Insufficient documentation

## 2020-07-15 DIAGNOSIS — Z87891 Personal history of nicotine dependence: Secondary | ICD-10-CM | POA: Diagnosis not present

## 2020-07-15 LAB — CBC WITH DIFFERENTIAL/PLATELET
Abs Immature Granulocytes: 0.02 10*3/uL (ref 0.00–0.07)
Basophils Absolute: 0 10*3/uL (ref 0.0–0.1)
Basophils Relative: 1 %
Eosinophils Absolute: 0.1 10*3/uL (ref 0.0–0.5)
Eosinophils Relative: 3 %
HCT: 35.9 % — ABNORMAL LOW (ref 36.0–46.0)
Hemoglobin: 11.3 g/dL — ABNORMAL LOW (ref 12.0–15.0)
Immature Granulocytes: 1 %
Lymphocytes Relative: 6 %
Lymphs Abs: 0.3 10*3/uL — ABNORMAL LOW (ref 0.7–4.0)
MCH: 29.7 pg (ref 26.0–34.0)
MCHC: 31.5 g/dL (ref 30.0–36.0)
MCV: 94.5 fL (ref 80.0–100.0)
Monocytes Absolute: 0.4 10*3/uL (ref 0.1–1.0)
Monocytes Relative: 8 %
Neutro Abs: 3.4 10*3/uL (ref 1.7–7.7)
Neutrophils Relative %: 81 %
Platelets: 187 10*3/uL (ref 150–400)
RBC: 3.8 MIL/uL — ABNORMAL LOW (ref 3.87–5.11)
RDW: 14 % (ref 11.5–15.5)
WBC: 4.2 10*3/uL (ref 4.0–10.5)
nRBC: 0 % (ref 0.0–0.2)

## 2020-07-15 LAB — COMPREHENSIVE METABOLIC PANEL
ALT: 9 U/L (ref 0–44)
AST: 21 U/L (ref 15–41)
Albumin: 3.1 g/dL — ABNORMAL LOW (ref 3.5–5.0)
Alkaline Phosphatase: 73 U/L (ref 38–126)
Anion gap: 7 (ref 5–15)
BUN: 13 mg/dL (ref 8–23)
CO2: 24 mmol/L (ref 22–32)
Calcium: 9.6 mg/dL (ref 8.9–10.3)
Chloride: 104 mmol/L (ref 98–111)
Creatinine, Ser: 1.3 mg/dL — ABNORMAL HIGH (ref 0.44–1.00)
GFR, Estimated: 42 mL/min — ABNORMAL LOW (ref >60)
Glucose, Bld: 102 mg/dL — ABNORMAL HIGH (ref 70–99)
Potassium: 4.2 mmol/L (ref 3.5–5.1)
Sodium: 135 mmol/L (ref 135–145)
Total Bilirubin: 1.1 mg/dL (ref 0.3–1.2)
Total Protein: 7.2 g/dL (ref 6.5–8.1)

## 2020-07-15 LAB — CBC
HCT: 36 % (ref 36.0–46.0)
Hemoglobin: 11.4 g/dL — ABNORMAL LOW (ref 12.0–15.0)
MCH: 29.2 pg (ref 26.0–34.0)
MCHC: 31.7 g/dL (ref 30.0–36.0)
MCV: 92.1 fL (ref 80.0–100.0)
Platelets: 207 10*3/uL (ref 150–400)
RBC: 3.91 MIL/uL (ref 3.87–5.11)
RDW: 14 % (ref 11.5–15.5)
WBC: 4.1 10*3/uL (ref 4.0–10.5)
nRBC: 0 % (ref 0.0–0.2)

## 2020-07-15 LAB — PROTIME-INR
INR: 1.1 (ref 0.8–1.2)
Prothrombin Time: 14.1 seconds (ref 11.4–15.2)

## 2020-07-15 LAB — LACTIC ACID, PLASMA
Lactic Acid, Venous: 1.2 mmol/L (ref 0.5–1.9)
Lactic Acid, Venous: 1.3 mmol/L (ref 0.5–1.9)

## 2020-07-15 LAB — RESP PANEL BY RT-PCR (FLU A&B, COVID) ARPGX2
Influenza A by PCR: NEGATIVE
Influenza B by PCR: NEGATIVE
SARS Coronavirus 2 by RT PCR: POSITIVE — AB

## 2020-07-15 LAB — LIPASE, BLOOD: Lipase: 20 U/L (ref 11–51)

## 2020-07-15 MED ORDER — ONDANSETRON HCL 4 MG/2ML IJ SOLN
4.0000 mg | Freq: Once | INTRAMUSCULAR | Status: AC
  Administered 2020-07-15: 4 mg via INTRAVENOUS
  Filled 2020-07-15: qty 2

## 2020-07-15 MED ORDER — ACETAMINOPHEN 325 MG PO TABS
650.0000 mg | ORAL_TABLET | Freq: Once | ORAL | Status: AC | PRN
  Administered 2020-07-15: 650 mg via ORAL
  Filled 2020-07-15: qty 2

## 2020-07-15 MED ORDER — LACTATED RINGERS IV BOLUS
500.0000 mL | Freq: Once | INTRAVENOUS | Status: AC
  Administered 2020-07-15: 500 mL via INTRAVENOUS

## 2020-07-15 MED ORDER — ONDANSETRON 4 MG PO TBDP: 4.0000 mg | ORAL_TABLET | Freq: Three times a day (TID) | ORAL | 0 refills | Status: AC | PRN

## 2020-07-15 MED ORDER — LIDOCAINE VISCOUS HCL 2 % MT SOLN
15.0000 mL | Freq: Once | OROMUCOSAL | Status: AC
  Administered 2020-07-15: 15 mL via OROMUCOSAL
  Filled 2020-07-15: qty 15

## 2020-07-15 MED ORDER — PREDNISONE 10 MG PO TABS: ORAL_TABLET | ORAL | 0 refills | Status: AC

## 2020-07-15 MED ORDER — MOLNUPIRAVIR 200 MG PO CAPS: 800.0000 mg | ORAL_CAPSULE | Freq: Two times a day (BID) | ORAL | 0 refills | Status: AC

## 2020-07-15 NOTE — ED Provider Notes (Signed)
Nicole Meyer   CSN: [id number] Arrival date & time: 07/15/20  1829     History Chief Complaint  Patient presents with  . Vomiting  . Weakness    Nicole Meyer is a 80 y.o. female.  HPI      Nicole Meyer is a 79 y.o. female, with a history of asthma, COPD, anxiety, CKD stage III, CAD, hypercholesterolemia, presenting to the ED with cough, shortness of breath, N/V beginning today.  Nonbloody, nonbilious emesis. Also endorses chills and a fever was noted by the patient's daughter prior to calling EMS. EMS noted a tick on the patient's right lower leg.  Patient does not know when this tick attached itself.  She states she does not spend much time outside, however, she does have cats that do go outside.  Patient uses 2 L supplemental O2, as needed.  Today, she has had to use this consistently. She has had 2 COVID vaccination injections. Denies abdominal pain, chest pain, back pain, diarrhea, hematochezia/melena, urinary symptoms, or any other complaints.  Past Medical History:  Diagnosis Date  . Anxiety   . Arthritis    "knees and back" (02/12/2013)  . Arthritis   . Asthma    no problems recently  . Blood dyscrasia    sperocytosis  . Cancer ([place]) 04-06-12   skin cancer nasal bridge-no problems now  . Chronic kidney disease (CKD), stage III (moderate) (HCC)   . COPD (chronic obstructive pulmonary disease) ([place])   . Coronary artery disease   . Depression   . Diverticulitis   . Fatigue   . GERD (gastroesophageal reflux disease)   . Gout 04/05/2013  . H/O hiatal hernia   . History of blood transfusion    "2 units in 12/2012; suppose to get 1 unit today" (02/12/2013)  . HTN (hypertension)   . Hyperbilirubinemia 05/03/2013  . Hypercalcemia   . Hypercholesteremia    "above borderline; I don't take RX for it" (12/26/2011)  . IBS (irritable bowel syndrome)    ibs  . Iron deficiency anemia    [facility]  . Leukocytopenia   . Lumbar spinal stenosis   . Lymphopenia 08/23/2011  . Osteoarthritis   . Osteoporosis   . PMR (polymyalgia rheumatica) (HCC)    pmr  . Renal insufficiency   . Syncope and collapse 12/24/2011   "loss of consciousness for 3-4 min" (12/26/2011)  . UTI (urinary tract infection) 12/26/2011    Patient Active Problem List   Diagnosis Date Noted  . Physical deconditioning 09/07/2019  . Right calf pain 09/07/2019  . Hypoxemia 09/07/2019  . Coronary artery disease 08/17/2019  . Atherosclerosis of coronary artery of native heart with unstable angina pectoris ([place]) 08/12/2019  . Unstable angina ([place]) 08/10/2019  . PAD (peripheral artery disease) ([place]) 02/28/2018  . Lumbar stenosis 02/28/2018  . Chronic anemia 02/28/2018  . Acute CVA (cerebrovascular accident) ([place]) 02/27/2018  . S/P total knee replacement using cement 05/19/2015  . Chronic joint pain 08/30/2013  . Serum total bilirubin elevated 05/03/2013  . Abdominal pain 01/03/2013  . AKI (acute kidney injury) ([place]) 01/03/2013  . Protein-calorie malnutrition, moderate ([place]) 04/10/2012  . Umbilical hernia s/p primary repair 04/08/2012 03/30/2012  . Obesity (BMI 30-39.9) 01/08/2012  . 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 04/08/2012 12/26/2011  . UTI (urinary tract infection) 12/26/2011  . Chest pain, noncardiac - probably due to giant hiatal hernia 12/25/2011  . COPD (chronic obstructive  pulmonary disease) ([place]) 12/24/2011  . Lymphopenia 08/23/2011  . 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 Leukocytopenia   . HTN (hypertension)   . Asthma   . IBS (irritable bowel syndrome)   . Arthritis   . Anxiety   . Chronic kidney disease (CKD), stage III (moderate) (HCC)   . Chronic fatigue     Past Surgical History:  Procedure Laterality Date  . APPENDECTOMY  79  . arthroscopic knee surgery-left    . BREAST EXCISIONAL BIOPSY Right 1995  . BREAST SURGERY  04-06-12   rt. lumpectomy_benign  .  CARDIAC CATHETERIZATION  08/12/2019  . carpal tunnel right    . COLONOSCOPY, ESOPHAGOGASTRODUODENOSCOPY (EGD) AND ESOPHAGEAL DILATION     06/2013  . CORONARY ARTERY BYPASS GRAFT N/A 08/17/2019   Procedure: CORONARY ARTERY BYPASS GRAFTING (CABG) x 4 using LIMA to LAD(d) and endscopic right greater saphenous vein harvest: SVG to Diag1; SVG to OM1; SVG to PDA. Flowtrack: Only;  Surgeon: [name], MD;  Location: [facility];  Service: Open Heart Surgery;  Laterality: N/A;  . ENDOVEIN HARVEST OF GREATER SAPHENOUS VEIN Right 08/17/2019   Procedure: ENDOVEIN HARVEST OF GREATER SAPHENOUS VEIN;  Surgeon: [name], MD;  Location: [facility];  Service: Open Heart Surgery;  Laterality: Right;  . ESOPHAGOGASTRODUODENOSCOPY  12/26/2011   Procedure: ESOPHAGOGASTRODUODENOSCOPY (EGD);  Surgeon: Inda Castle, MD;  Location: [facility];  Service: Endoscopy;  Laterality: N/A;  . EYE SURGERY     cataracts  . HERNIA REPAIR  1970's   "stomach"   . LAPAROSCOPIC NISSEN FUNDOPLICATION N/A [id number]/[id number]   Procedure: Laparoscopic Reduction and Repair of Paraesophagel Hiatal Hernia with Nissen;  Surgeon: Adin Hector, MD;  Location: [facility];  Service: General;  Laterality: N/A;  Laparoscopic Reduction and Repair of Paraesophagel Hiatal Hernia with Nissen  . LEFT HEART CATH AND CORONARY ANGIOGRAPHY N/A 08/12/2019   Procedure: LEFT HEART CATH AND CORONARY ANGIOGRAPHY;  Surgeon: Jettie Booze, MD;  Location: [facility];  Service: Cardiovascular;  Laterality: N/A;  . NISSEN FUNDOPLICATION     03/6946  . TEE WITHOUT CARDIOVERSION N/A 08/17/2019   Procedure: TRANSESOPHAGEAL ECHOCARDIOGRAM (TEE);  Surgeon: [name], MD;  Location: [facility];  Service: Open Heart Surgery;  Laterality: N/A;  . TOTAL KNEE ARTHROPLASTY Right 05/19/2015   Procedure: RIGHT TOTAL KNEE ARTHROPLASTY;  Surgeon: Netta Cedars, MD;  Location: [facility];  Service: Orthopedics;  Laterality: Right;  . TUBAL LIGATION  1970's  .  VAGINAL HYSTERECTOMY  ~ 1980   right ovary     OB History   No obstetric history on file.     Family History  Problem Relation Age of Onset  . Diabetes Father   . Heart disease Father   . Breast cancer Maternal Aunt        in 68's    Social History   Tobacco Use  . Smoking status: Former Smoker    Packs/day: 1.00    Years: 40.00    Pack years: 40.00    Types: Cigarettes    Quit date: 02/19/2000    Years since quitting: 20.4  . Smokeless tobacco: Never Used  Vaping Use  . Vaping Use: Never used  Substance Use Topics  . Alcohol use: No  . Drug use: No    Home Medications Prior to Admission medications   Medication Sig Start Date End Date Taking? Authorizing Provider  Molnupiravir 200 MG CAPS Take 4 capsules (800 mg total) by mouth every 12 (twelve)  hours for 5 days. 07/15/20 07/20/20 Yes Kaleel Schmieder C, PA-C  ondansetron (ZOFRAN ODT) 4 MG disintegrating tablet Take 1 tablet (4 mg total) by mouth every 8 (eight) hours as needed for nausea or vomiting. 07/15/20  Yes Daquann Merriott C, PA-C  predniSONE (DELTASONE) 10 MG tablet Take 4 tablets (40 mg total) by mouth daily for 5 days, THEN 3 tablets (30 mg total) daily for 1 day, THEN 2 tablets (20 mg total) daily for 1 day, THEN 1 tablet (10 mg total) daily for 1 day. 07/15/20 07/23/20 Yes Shiree Altemus C, PA-C  acetaminophen (TYLENOL) 325 MG tablet Take 650 mg by mouth every 6 (six) hours as needed for mild pain or moderate pain.    [provider]  albuterol (PROVENTIL HFA;VENTOLIN HFA) 108 (90 Base) MCG/ACT inhaler Inhale 2 puffs into the lungs every 6 (six) hours as needed for wheezing or shortness of breath.    [provider]  bisacodyl (DULCOLAX) 10 MG suppository If not relieved by MOM, give 10 mg Bisacodyl suppositiory rectally X 1 dose in 24 hours as needed (Do not use constipation standing orders for residents with renal failure/CFR less than 30. Contact MD for orders) (Physician Order) 08/24/19   [provider]   Calcium Carbonate-Vitamin D3 600-400 MG-UNIT TABS Take 2 tablets by mouth daily.    [provider]  cyanocobalamin 500 MCG tablet Take 500 mcg by mouth daily after breakfast. Vitamin B12    [provider]  folic acid (FOLVITE) 462 MCG tablet Take 400 mcg by mouth daily after breakfast.     [provider]  isosorbide dinitrate (ISORDIL) 30 MG tablet Take 30 mg by mouth daily.    [provider]  Menthol, Topical Analgesic, (ICY HOT) 7.5 % (Roll) MISC Apply 1 each topically daily as needed (arthritis pain).    [provider]  metoprolol succinate (TOPROL-XL) 25 MG 24 hr tablet Take 25 mg by mouth daily.    [provider]  Multiple Vitamins-Minerals (PRESERVISION AREDS) TABS Take 1 tablet by mouth daily.    [provider]  NON FORMULARY Heart healthy diet 08/24/19   [provider]  ondansetron (ZOFRAN) 4 MG tablet Take 4 mg by mouth every 8 (eight) hours as needed for nausea or vomiting.    [provider]  rosuvastatin (CRESTOR) 20 MG tablet Take 1 tablet (20 mg total) by mouth daily. 09/09/18   Frann Rider, NP  Sodium Phosphates (RA SALINE ENEMA RE) Place rectally. If not relieved by Biscodyl suppository, give disposable Saline Enema rectally X 1 dose/24 hrs as needed (Do not use constipation standing orders for residents with renal failure/CFR less than 30. Contact MD for orders)(Physician Or 08/24/19   [provider]    Allergies    Penicillins, Brovana [arformoterol], and Paxil [paroxetine hcl]  Review of Systems   Review of Systems  Constitutional: Positive for chills and fever.  Gastrointestinal: Positive for nausea and vomiting. Negative for abdominal pain and diarrhea.  Musculoskeletal: Negative for back pain, neck pain and neck stiffness.  Skin: Negative for rash.  Neurological: Positive for weakness (generalized).  All other systems reviewed and are negative.   Physical Exam Updated  Vital Signs BP (!) 164/88 (BP Location: Left Arm)   Pulse 86   Temp (!) 102.4 F (39.1 C)   Resp (!) 26   SpO2 98%   Physical Exam Vitals and nursing Meyer reviewed.  Constitutional:      General: She is not in  acute distress.    Appearance: She is well-developed. She is not diaphoretic.  HENT:     Head: Normocephalic and atraumatic.     Mouth/Throat:     Mouth: Mucous membranes are moist.     Pharynx: Oropharynx is clear.  Eyes:     Conjunctiva/sclera: Conjunctivae normal.  Cardiovascular:     Rate and Rhythm: Normal rate and regular rhythm.     Pulses: Normal pulses.          Radial pulses are 2+ on the right side and 2+ on the left side.       Posterior tibial pulses are 2+ on the right side and 2+ on the left side.     Heart sounds: Normal heart sounds.     Comments: Tactile temperature in the extremities appropriate and equal bilaterally. Pulmonary:     Effort: Pulmonary effort is normal. No respiratory distress.     Breath sounds: Normal breath sounds.  Abdominal:     Palpations: Abdomen is soft.     Tenderness: There is no abdominal tenderness. There is no guarding.  Musculoskeletal:     Cervical back: Neck supple.     Right lower leg: No edema.     Left lower leg: No edema.     Comments: Small tick attached to the right anterior lower leg.  Does not appear engorged.  No surrounding swelling, erythema, tenderness.  Lymphadenopathy:     Cervical: No cervical adenopathy.  Skin:    General: Skin is warm and dry.  Neurological:     Mental Status: She is alert.  Psychiatric:        Mood and Affect: Mood and affect normal.        Speech: Speech normal.        Behavior: Behavior normal.            ED Results / Procedures / Treatments   Labs (all labs ordered are listed, but only abnormal results are displayed) Labs Reviewed  RESP PANEL BY RT-PCR (FLU A&B, COVID) ARPGX2 - Abnormal; Notable for the following components:      Result Value   SARS Coronavirus  2 by RT PCR POSITIVE (*)    All other components within normal limits  COMPREHENSIVE METABOLIC PANEL - Abnormal; Notable for the following components:   Glucose, Bld 102 (*)    Creatinine, Ser 1.30 (*)    Albumin 3.1 (*)    GFR, Estimated 42 (*)    All other components within normal limits  CBC - Abnormal; Notable for the following components:   Hemoglobin 11.4 (*)    All other components within normal limits  CBC WITH DIFFERENTIAL/PLATELET - Abnormal; Notable for the following components:   RBC 3.80 (*)    Hemoglobin 11.3 (*)    HCT 35.9 (*)    Lymphs Abs 0.3 (*)    All other components within normal limits  CULTURE, BLOOD (ROUTINE X 2)  CULTURE, BLOOD (ROUTINE X 2)  URINE CULTURE  LIPASE, BLOOD  LACTIC ACID, PLASMA  LACTIC ACID, PLASMA  PROTIME-INR    EKG None  Radiology DG Chest 2 View  Result Date: 07/15/2020 CLINICAL DATA:  80 year old female with sepsis. EXAM: CHEST - 2 VIEW COMPARISON:  Chest radiograph dated 08/22/2019. FINDINGS: No focal consolidation, pleural effusion, or pneumothorax. Near complete resolution of the previously seen left pleural effusion. Stable cardiac silhouette. Median sternotomy wires and CABG vascular clips. No acute osseous pathology. IMPRESSION: No active cardiopulmonary disease. Electronically Signed  By: Anner Crete [name]   On: 07/15/2020 19:35    Procedures .Foreign Body Removal  Date/Time: 07/15/2020 9:30 PM Performed by: Lorayne Bender, PA-C Authorized by: Lorayne Bender, PA-C  Consent: Verbal consent obtained. Risks and benefits: risks, benefits and alternatives were discussed Consent given by: patient Patient identity confirmed: verbally with patient and provided demographic data Body area: skin General location: lower extremity Location details: right lower leg  Anesthesia: Local anesthetic: Viscous lidocaine.  Sedation: Patient sedated: no  Patient restrained: no Patient cooperative: yes Localization method:  visualized Removal mechanism: forceps Tendon involvement: none Depth: subcutaneous Complexity: simple 1 objects recovered. Objects recovered: Tick Post-procedure assessment: foreign body removed Patient tolerance: patient tolerated the procedure well with no immediate complications Comments: Tick was removed with mouthparts appearing to be intact on the tick.     Medications Ordered in ED Medications  acetaminophen (TYLENOL) tablet 650 mg (650 mg Oral Given 07/15/20 2000)  lidocaine (XYLOCAINE) 2 % viscous mouth solution 15 mL (15 mLs Mouth/Throat Given 07/15/20 2035)  lactated ringers bolus 500 mL (500 mLs Intravenous New Bag/Given 07/15/20 2327)  ondansetron (ZOFRAN) injection 4 mg (4 mg Intravenous Given 07/15/20 2322)    ED Course  I have reviewed the triage vital signs and the nursing notes.  Pertinent labs & imaging results that were available during my care of the patient were reviewed by me and considered in my medical decision making (see chart for details).  Clinical Course as of 07/16/20 0055  Sat Jul 15, 2020  1900 SpO2: 98 % On room air. Home 2 L supplemental O2 was added at patient request. [SJ]  2150 Patient reassessed. States she "feels fine." Denies nausea, shortness of breath.  [SJ]  2335 Attempted to call patient's husband per his request. No answer. No VM set up.  [SJ]    Clinical Course User Index [SJ] Jaykwon Morones, Helane Gunther, PA-C   MDM Rules/Calculators/A&P                          Patient presents with some shortness of breath, nausea, vomiting, generalized weakness. Patient is nontoxic appearing, not tachycardic, not tachypneic, not hypotensive, maintains excellent SPO2 on room air, and is in no apparent distress.  Initially febrile.  Improved with antipyretics. I have reviewed the patient's chart to obtain more information.   I reviewed and interpreted the patient's labs and radiological studies. Patient is COVID-positive here in the ED. Although a tick was  removed from the patient today, my suspicion for tickborne illness is low.  I find it much more likely the patient's symptoms are caused by COVID-19. The patient was given instructions for home care as well as return precautions. Patient voices understanding of these instructions, accepts the plan, and is comfortable with discharge.  Findings and plan of care discussed with attending physician, Theotis Burrow, MD. Dr. Rex Kras personally evaluated and examined this patient.  End of shift patient care handoff report given to Antonietta Breach, PA-C. Plan: Awaiting UA results.  Discharge.  Vitals:   07/15/20 2330 07/16/20 0000 07/16/20 0014 07/16/20 0045  BP: (!) 144/72 (!) 154/73  138/71  Pulse: 78 96 82 84  Resp: 16 (!) 26 17 16   Temp:      TempSrc:      SpO2: 98% 99% 100% 99%     Final Clinical Impression(s) / ED Diagnoses Final diagnoses:  COVID-19    Rx / DC Orders ED Discharge Orders  Ordered    Molnupiravir 200 MG CAPS  Every 12 hours        07/15/20 2343    ondansetron (ZOFRAN ODT) 4 MG disintegrating tablet  Every 8 hours PRN        07/15/20 2345    predniSONE (DELTASONE) 10 MG tablet        07/15/20 2345           Lorayne Bender, PA-C 07/16/20 0100    Little, Wenda Overland, MD 07/16/20 1416

## 2020-07-15 NOTE — ED Notes (Signed)
Patient transported to X-ray 

## 2020-07-15 NOTE — ED Notes (Signed)
Mother of Nicole Meyer called update her at [phone]

## 2020-07-15 NOTE — Discharge Instructions (Addendum)
COVID-19 Home Management:  You have had a positive test for COVID-19. COVID-19 is caused by a virus. Viruses do not require or respond to antibiotics. Treatment is symptomatic care and it is important to note that these symptoms may last for 7-14 days.   Hand washing: Wash your hands throughout the day, but especially before and after touching the face, using the restroom, sneezing, coughing, or touching surfaces that have been coughed or sneezed upon. Hydration: Symptoms of most illnesses will be intensified and complicated by dehydration. Dehydration can also extend the duration of symptoms. Drink plenty of fluids and get plenty of rest. You should be drinking at least half a liter of water an hour to stay hydrated. Electrolyte drinks (ex. Gatorade, Powerade, Pedialyte) are also encouraged. You should be drinking enough fluids to make your urine light yellow, almost clear. If this is not the case, you are not drinking enough water. Please note that some of the treatments indicated below will not be effective if you are not adequately hydrated. Diet: Please concentrate on hydration, however, you may introduce food slowly.  Start with a clear liquid diet, progressed to a full liquid diet, and then bland solids as you are able. Pain or fever: Ibuprofen, Naproxen, or acetaminophen (generic for Tylenol) for pain or fever.  Nausea/vomiting: Use the ondansetron (generic for Zofran) for nausea or vomiting.  This medication may not prevent all vomiting or nausea, but can help facilitate better hydration. Things that can help with nausea/vomiting also include peppermint/menthol candies, vitamin B12, and ginger. Diarrhea: May use medications such as loperamide (Imodium) or Bismuth subsalicylate (Pepto-Bismol). Cough: Teas, warm liquids, broths, and honey can help with cough. Prednisone: Take the prednisone, as directed, in its entirety. Zyrtec or Claritin: May add these medication daily to control underlying  symptoms of congestion, sneezing, and other signs of allergies.  These medications are available over-the-counter. Generics: Cetirizine (generic for Zyrtec) and loratadine (generic for Claritin). Fluticasone: Use fluticasone (generic for Flonase), as directed, for nasal and sinus congestion.  This medication is available over-the-counter. Congestion: Plain guaifenesin (generic for plain Mucinex) may help relieve congestion. Saline sinus rinses and saline nasal sprays may also help relieve congestion.  Sore throat: Warm liquids or Chloraseptic spray may help soothe a sore throat. Gargle twice a day with a salt water solution made from a half teaspoon of salt in a cup of warm water.  Follow up: Follow up with a primary care provider within the next two weeks should symptoms fail to resolve. Return: Return to the ED for significantly worsening symptoms, shortness of breath, persistent/worsening chest pain, persistent vomiting, large amounts of blood in stool, worsening/localized abdominal pain, or any other major concerns.  For prescription assistance, may try using prescription discount sites or apps, such as goodrx.com  COVID-19 isolation recommendations  Patients who have symptoms consistent with COVID-19 should self isolate until: At least 3 days (72 hours) have passed since recovery, defined as resolution of fever without the use of fever reducing medications and improvement in respiratory symptoms (e.g., cough, shortness of breath), and At least 7 days have passed since symptoms first appeared. Retesting is not required and not recommended as patients can continue to test positive for several weeks despite lack of symptoms.

## 2020-07-15 NOTE — ED Notes (Signed)
Husband paul Hoos [phone] would like an update asap

## 2020-07-15 NOTE — ED Triage Notes (Addendum)
Pt to triage via [facility].  Reports nausea, vomiting, and generalized weakness x 2 days.  Denies pain.  18g R AC.  NS 100cc PTA.   Wears 2 liters oxygen as needed at home.   Daughter PAM [phone]

## 2020-07-16 LAB — URINALYSIS, ROUTINE W REFLEX MICROSCOPIC
Bilirubin Urine: NEGATIVE
Glucose, UA: NEGATIVE mg/dL
Hgb urine dipstick: NEGATIVE
Ketones, ur: NEGATIVE mg/dL
Nitrite: NEGATIVE
Protein, ur: NEGATIVE mg/dL
Specific Gravity, Urine: 1.012 (ref 1.005–1.030)
WBC, UA: >50 WBC/hpf — ABNORMAL HIGH (ref 0–5)
pH: 6 (ref 5.0–8.0)

## 2020-07-16 MED ORDER — CEPHALEXIN 250 MG PO CAPS
500.0000 mg | ORAL_CAPSULE | Freq: Once | ORAL | Status: AC
  Administered 2020-07-16: 500 mg via ORAL
  Filled 2020-07-16: qty 2

## 2020-07-16 MED ORDER — ACETAMINOPHEN 325 MG PO TABS
650.0000 mg | ORAL_TABLET | Freq: Once | ORAL | Status: AC
  Administered 2020-07-16: 650 mg via ORAL
  Filled 2020-07-16: qty 2

## 2020-07-16 MED ORDER — CEPHALEXIN 500 MG PO CAPS: 500.0000 mg | ORAL_CAPSULE | Freq: Two times a day (BID) | ORAL | 0 refills | Status: AC

## 2020-07-16 NOTE — ED Provider Notes (Signed)
1:06 AM Care assumed from [place], PA-C at shift change.  Urinalysis reviewed which is concerning for urinary tract infection.  Patient also diagnosed with COVID while in the ED today.  Her fever has defervesced with antipyretics.  She does have a chronic oxygen requirement without increased demand.  Sats remained at 99% on 2 L via nasal cannula.  She is in no distress while breathing, on my assessment.  While at bedside, no tachypnea or tachycardia noted.  No leukocytosis or other criteria for SIRS/sepsis at this time.  Plan for discharge on Keflex for UTI in addition to Molnupiravir, Prednisone, Zofran.  Patient encouraged to follow-up with her primary care doctor and to return for new or concerning symptoms.  Discharged in stable condition with no unaddressed concerns.   Antonietta Breach, PA-C 07/16/20 [place], April, MD 07/16/20 [id number]

## 2020-07-16 NOTE — ED Notes (Signed)
Patient verbalizes understanding of discharge instructions. Opportunity for questioning and answers were provided. Armband removed by staff, pt discharged from ED via wheelchair.  

## 2020-07-16 NOTE — ED Notes (Signed)
This RN spoke with pt daughter Jeannene Patella and informed her that pt would be waiting in the waiting room (covid box)

## 2020-07-18 LAB — URINE CULTURE: Culture: >=100000 — AB

## 2020-07-19 ENCOUNTER — Telehealth: Payer: Self-pay | Admitting: *Deleted

## 2020-07-19 NOTE — Progress Notes (Signed)
ED Antimicrobial Stewardship Positive Culture Follow Up   Nicole Meyer is an 80 y.o. female who presented to [facility] on 07/15/2020 with a chief complaint of cough, SOB, chills/fever and nausea/vomiting. Was diagnosed with COVID during ED visit. Urine tested and found to have >100k EColi (ESBL).  Chief Complaint  Patient presents with  . Vomiting  . Weakness    Recent Results (from the past 720 hour(s))  Culture, blood (Routine x 2)     Status: None (Preliminary result)   Collection Time: 07/15/20  7:05 PM   Specimen: BLOOD  Result Value Ref Range Status   Specimen Description BLOOD LEFT ANTECUBITAL  Final   Special Requests   Final    BOTTLES DRAWN AEROBIC AND ANAEROBIC Blood Culture results may not be optimal due to an inadequate volume of blood received in culture bottles   Culture   Final    NO GROWTH 4 DAYS Performed at [facility], [place] [address] [place], [place] [address]    Report Status PENDING  Incomplete  Culture, blood (Routine x 2)     Status: None (Preliminary result)   Collection Time: 07/15/20  8:22 PM   Specimen: BLOOD RIGHT WRIST  Result Value Ref Range Status   Specimen Description BLOOD RIGHT WRIST  Final   Special Requests   Final    BOTTLES DRAWN AEROBIC AND ANAEROBIC Blood Culture results may not be optimal due to an inadequate volume of blood received in culture bottles   Culture   Final    NO GROWTH 4 DAYS Performed at [facility], [place] [address], [place], [place] [address]    Report Status PENDING  Incomplete  Resp Panel by RT-PCR (Flu A&B, Covid) Nasopharyngeal Swab     Status: Abnormal   Collection Time: 07/15/20  8:22 PM   Specimen: Nasopharyngeal Swab; Nasopharyngeal(NP) swabs in vial transport medium  Result Value Ref Range Status   SARS Coronavirus 2 by RT PCR POSITIVE (A) NEGATIVE Final    Comment: RESULT CALLED TO, READ BACK BY AND VERIFIED WITH: RN SULLIVAN ERIKA BY MESSAN H. AT 2226 ON 07/15/2020 (NOTE) SARS-CoV-2  target nucleic acids are DETECTED.  The SARS-CoV-2 RNA is generally detectable in upper respiratory specimens during the acute phase of infection. Positive results are indicative of the presence of the identified virus, but do not rule out bacterial infection or co-infection with other pathogens not detected by the test. Clinical correlation with patient history and other diagnostic information is necessary to determine patient infection status. The expected result is Negative.  Fact Sheet for Patients: EntrepreneurPulse.com.au  Fact Sheet for Healthcare Providers: IncredibleEmployment.be  This test is not yet approved or cleared by the [place] FDA and  has been authorized for detection and/or diagnosis of SARS-CoV-2 by FDA under an Emergency Use Authorization (EUA).  This EUA will remain in effect (meanin g this test can be used) for the duration of  the COVID-19 declaration under Section 564(b)(1) of the Act, 21 U.S.C. section 360bbb-3(b)(1), unless the authorization is terminated or revoked sooner.     Influenza A by PCR NEGATIVE NEGATIVE Final   Influenza B by PCR NEGATIVE NEGATIVE Final    Comment: (NOTE) The Xpert Xpress SARS-CoV-2/FLU/RSV plus assay is intended as an aid in the diagnosis of influenza from Nasopharyngeal swab specimens and should not be used as a sole basis for treatment. Nasal washings and aspirates are unacceptable for Xpert Xpress SARS-CoV-2/FLU/RSV testing.  Fact Sheet for Patients: EntrepreneurPulse.com.au  Fact Sheet for Healthcare Providers: IncredibleEmployment.be  This test is not yet approved or cleared by the [place] FDA and has been authorized for detection and/or diagnosis of SARS-CoV-2 by FDA under an Emergency Use Authorization (EUA). This EUA will remain in effect (meaning this test can be used) for the duration of the COVID-19 declaration under Section  564(b)(1) of the Act, 21 U.S.C. section 360bbb-3(b)(1), unless the authorization is terminated or revoked.  Performed at [place] [facility], [place] [address] [place], [place] [address]   Urine culture     Status: Abnormal   Collection Time: 07/16/20 12:15 AM   Specimen: Urine, Catheterized  Result Value Ref Range Status   Specimen Description URINE, CATHETERIZED  Final   Special Requests   Final    NONE Performed at [facility], [place] [address], [place], [place] [address]    Culture (A)  Final    >=100,000 COLONIES/mL ESCHERICHIA COLI Confirmed Extended Spectrum Beta-Lactamase Producer (ESBL).  In bloodstream infections from ESBL organisms, carbapenems are preferred over piperacillin/tazobactam. They are shown to have a lower risk of mortality.    Report Status 07/18/2020 FINAL  Final   Organism ID, Bacteria ESCHERICHIA COLI (A)  Final      Susceptibility   Escherichia coli - MIC*    AMPICILLIN >=32 RESISTANT Resistant     CEFAZOLIN >=64 RESISTANT Resistant     CEFEPIME 16 RESISTANT Resistant     CEFTRIAXONE >=64 RESISTANT Resistant     CIPROFLOXACIN >=4 RESISTANT Resistant     GENTAMICIN <=1 SENSITIVE Sensitive     IMIPENEM <=0.25 SENSITIVE Sensitive     NITROFURANTOIN <=16 SENSITIVE Sensitive     TRIMETH/SULFA >=320 RESISTANT Resistant     AMPICILLIN/SULBACTAM 16 INTERMEDIATE Intermediate     PIP/TAZO <=4 SENSITIVE Sensitive     * >=100,000 COLONIES/mL ESCHERICHIA COLI    [x]  Treated with cephalexin 500mg  BID PO BID x 7 days, organism resistant to prescribed antimicrobial []  Patient discharged originally without antimicrobial agent and treatment is now indicated  New antibiotic prescription: Call and assess for symptom check of pyelonephritis (urgency, frequency and painful urination). If any symptoms, please ask patient to return to ED for IV antibiotics. -If no symptoms as described above, then stop cephalexin and prescribe Fosfomycin 3g PO x 2 doses. Take 1 dose  every 3 days.  ED Provider: Dene Gentry, MD  Joetta Manners, PharmD, [facility] Clinical Pharmacist  Please check AMION for all [facility] phone numbers After 10:00 PM, call [place] [phone] Monday - Friday phone -  [phone] Saturday - Sunday phone - [phone]

## 2020-07-19 NOTE — Telephone Encounter (Signed)
Post ED Visit - Positive Culture Follow-up: Successful Patient Follow-Up  Culture assessed and recommendations reviewed by:  []  Elenor Quinones, Pharm.D. []  Heide Guile, Pharm.D., BCPS AQ-ID []  Parks Neptune, Pharm.D., BCPS []  Alycia Rossetti, Pharm.D., BCPS []  [place], [place].D., BCPS, AAHIVP []  Legrand Como, Pharm.D., BCPS, AAHIVP []  Salome Arnt, PharmD, BCPS []  Johnnette Gourd, PharmD, BCPS []  Hughes Better, PharmD, BCPS []  Leeroy Cha, PharmD  Positive urine culture  []  Patient discharged without antimicrobial prescription and treatment is now indicated [x]  Organism is resistant to prescribed ED discharge antimicrobial []  Patient with positive blood cultures  Contacted pt who states she is asymptomatic. Will can in presciption provided by Dr. Francia Greaves.  Changes discussed with ED provider Dene Gentry, [facility] New antibiotic prescription Fosfomycin 3g PO x 2 doses.  Take 1 dose every 3 days Called to [facility] [phone]  Contacted patient, date 07/19/2020, time [place], [place] 07/19/2020, 8:42 AM

## 2020-07-20 LAB — CULTURE, BLOOD (ROUTINE X 2)
Culture: NO GROWTH
Culture: NO GROWTH

## 2020-07-22 ENCOUNTER — Other Ambulatory Visit: Payer: Self-pay

## 2020-07-22 ENCOUNTER — Emergency Department (HOSPITAL_COMMUNITY): Payer: Medicare PPO

## 2020-07-22 ENCOUNTER — Encounter (HOSPITAL_COMMUNITY): Payer: Self-pay

## 2020-07-22 ENCOUNTER — Emergency Department (HOSPITAL_COMMUNITY)
Admission: EM | Admit: 2020-07-22 | Discharge: 2020-07-23 | Disposition: A | Discharge status: Home / Self Care | Payer: Medicare PPO | Attending: Emergency Medicine | Admitting: Emergency Medicine

## 2020-07-22 DIAGNOSIS — K760 Fatty (change of) liver, not elsewhere classified: Secondary | ICD-10-CM | POA: Diagnosis not present

## 2020-07-22 DIAGNOSIS — R0600 Dyspnea, unspecified: Secondary | ICD-10-CM | POA: Diagnosis not present

## 2020-07-22 DIAGNOSIS — R0602 Shortness of breath: Secondary | ICD-10-CM | POA: Insufficient documentation

## 2020-07-22 DIAGNOSIS — I25110 Atherosclerotic heart disease of native coronary artery with unstable angina pectoris: Secondary | ICD-10-CM | POA: Diagnosis not present

## 2020-07-22 DIAGNOSIS — R1084 Generalized abdominal pain: Secondary | ICD-10-CM | POA: Insufficient documentation

## 2020-07-22 DIAGNOSIS — I129 Hypertensive chronic kidney disease with stage 1 through stage 4 chronic kidney disease, or unspecified chronic kidney disease: Secondary | ICD-10-CM | POA: Insufficient documentation

## 2020-07-22 DIAGNOSIS — J449 Chronic obstructive pulmonary disease, unspecified: Secondary | ICD-10-CM | POA: Diagnosis not present

## 2020-07-22 DIAGNOSIS — J45909 Unspecified asthma, uncomplicated: Secondary | ICD-10-CM | POA: Diagnosis not present

## 2020-07-22 DIAGNOSIS — Z87891 Personal history of nicotine dependence: Secondary | ICD-10-CM | POA: Diagnosis not present

## 2020-07-22 DIAGNOSIS — U099 Post covid-19 condition, unspecified: Secondary | ICD-10-CM | POA: Diagnosis not present

## 2020-07-22 DIAGNOSIS — R531 Weakness: Secondary | ICD-10-CM | POA: Insufficient documentation

## 2020-07-22 DIAGNOSIS — R109 Unspecified abdominal pain: Secondary | ICD-10-CM | POA: Diagnosis not present

## 2020-07-22 DIAGNOSIS — Z96651 Presence of right artificial knee joint: Secondary | ICD-10-CM | POA: Insufficient documentation

## 2020-07-22 DIAGNOSIS — Z79899 Other long term (current) drug therapy: Secondary | ICD-10-CM | POA: Insufficient documentation

## 2020-07-22 DIAGNOSIS — N183 Chronic kidney disease, stage 3 unspecified: Secondary | ICD-10-CM | POA: Insufficient documentation

## 2020-07-22 DIAGNOSIS — R5381 Other malaise: Secondary | ICD-10-CM | POA: Diagnosis not present

## 2020-07-22 DIAGNOSIS — U071 COVID-19: Secondary | ICD-10-CM | POA: Diagnosis not present

## 2020-07-22 DIAGNOSIS — I10 Essential (primary) hypertension: Secondary | ICD-10-CM | POA: Diagnosis not present

## 2020-07-22 LAB — CBC WITH DIFFERENTIAL/PLATELET
Abs Immature Granulocytes: 0.01 10*3/uL (ref 0.00–0.07)
Basophils Absolute: 0 10*3/uL (ref 0.0–0.1)
Basophils Relative: 0 %
Eosinophils Absolute: 0.1 10*3/uL (ref 0.0–0.5)
Eosinophils Relative: 2 %
HCT: 37.8 % (ref 36.0–46.0)
Hemoglobin: 12.6 g/dL (ref 12.0–15.0)
Immature Granulocytes: 0 %
Lymphocytes Relative: 8 %
Lymphs Abs: 0.5 10*3/uL — ABNORMAL LOW (ref 0.7–4.0)
MCH: 29 pg (ref 26.0–34.0)
MCHC: 33.3 g/dL (ref 30.0–36.0)
MCV: 87.1 fL (ref 80.0–100.0)
Monocytes Absolute: 0.4 10*3/uL (ref 0.1–1.0)
Monocytes Relative: 6 %
Neutro Abs: 4.8 10*3/uL (ref 1.7–7.7)
Neutrophils Relative %: 84 %
Platelets: 171 10*3/uL (ref 150–400)
RBC: 4.34 MIL/uL (ref 3.87–5.11)
RDW: 13.7 % (ref 11.5–15.5)
WBC: 5.7 10*3/uL (ref 4.0–10.5)
nRBC: 0 % (ref 0.0–0.2)

## 2020-07-22 LAB — COMPREHENSIVE METABOLIC PANEL WITH GFR
ALT: 17 U/L (ref 0–44)
AST: 22 U/L (ref 15–41)
Albumin: 3.2 g/dL — ABNORMAL LOW (ref 3.5–5.0)
Alkaline Phosphatase: 57 U/L (ref 38–126)
Anion gap: 8 (ref 5–15)
BUN: 18 mg/dL (ref 8–23)
CO2: 24 mmol/L (ref 22–32)
Calcium: 8.8 mg/dL — ABNORMAL LOW (ref 8.9–10.3)
Chloride: 104 mmol/L (ref 98–111)
Creatinine, Ser: 1.17 mg/dL — ABNORMAL HIGH (ref 0.44–1.00)
GFR, Estimated: 47 mL/min — ABNORMAL LOW
Glucose, Bld: 125 mg/dL — ABNORMAL HIGH (ref 70–99)
Potassium: 3.1 mmol/L — ABNORMAL LOW (ref 3.5–5.1)
Sodium: 136 mmol/L (ref 135–145)
Total Bilirubin: 1.7 mg/dL — ABNORMAL HIGH (ref 0.3–1.2)
Total Protein: 6.7 g/dL (ref 6.5–8.1)

## 2020-07-22 LAB — LIPASE, BLOOD: Lipase: 22 U/L (ref 11–51)

## 2020-07-22 MED ORDER — SODIUM CHLORIDE 0.9 % IV BOLUS
500.0000 mL | Freq: Once | INTRAVENOUS | Status: AC
  Administered 2020-07-22: 500 mL via INTRAVENOUS

## 2020-07-22 NOTE — ED Provider Notes (Signed)
Uncertain DEPT Provider Note   CSN: [id number] Arrival date & time: 07/22/20  2042     History Chief Complaint  Patient presents with  . Cough    Nicole Meyer is a 80 y.o. female with a hx of CKD stage III, COPD with chronic respiratory failure on 2L via [place] PRN at baseline, hypercholesterolemia, IBS, PMR, prior CVA, & CAD who presents to the ED via EMS with complaints of continued dyspnea & weakness x 1 week. Patient states she has continued to feel poorly since her last ED visit, she completed all prescribed medications with one day with an additional day of steroids remaining. She states she feels persistently weak, short of breath, has had a dry cough, and today started to have constant generalized abdominal pain. Dyspnea is worse with activity. No other alleviating/aggravating factors. Has had some continued subjective fevers. States she has poor appetite. Denies chest pain, vomiting, diarrhea, syncope, hemoptysis, dysuria, or unilateral leg pain/swelling.    HPI     Past Medical History:  Diagnosis Date  . Anxiety   . Arthritis    "knees and back" (02/12/2013)  . Arthritis   . Asthma    no problems recently  . Blood dyscrasia    sperocytosis  . Cancer ([place]) 04-06-12   skin cancer nasal bridge-no problems now  . Chronic kidney disease (CKD), stage III (moderate) (HCC)   . COPD (chronic obstructive pulmonary disease) ([place])   . Coronary artery disease   . Depression   . Diverticulitis   . Fatigue   . GERD (gastroesophageal reflux disease)   . Gout 04/05/2013  . H/O hiatal hernia   . History of blood transfusion    "2 units in 12/2012; suppose to get 1 unit today" (02/12/2013)  . HTN (hypertension)   . Hyperbilirubinemia 05/03/2013  . Hypercalcemia   . Hypercholesteremia    "above borderline; I don't take RX for it" (12/26/2011)  . IBS (irritable bowel syndrome)    ibs  . Iron deficiency anemia    [facility]  . Leukocytopenia   . Lumbar spinal stenosis   . Lymphopenia 08/23/2011  . Osteoarthritis   . Osteoporosis   . PMR (polymyalgia rheumatica) (HCC)    pmr  . Renal insufficiency   . Syncope and collapse 12/24/2011   "loss of consciousness for 3-4 min" (12/26/2011)  . UTI (urinary tract infection) 12/26/2011    Patient Active Problem List   Diagnosis Date Noted  . Physical deconditioning 09/07/2019  . Right calf pain 09/07/2019  . Hypoxemia 09/07/2019  . Coronary artery disease 08/17/2019  . Atherosclerosis of coronary artery of native heart with unstable angina pectoris ([place]) 08/12/2019  . Unstable angina ([place]) 08/10/2019  . PAD (peripheral artery disease) ([place]) 02/28/2018  . Lumbar stenosis 02/28/2018  . Chronic anemia 02/28/2018  . Acute CVA (cerebrovascular accident) ([place]) 02/27/2018  . S/P total knee replacement using cement 05/19/2015  . Chronic joint pain 08/30/2013  . Serum total bilirubin elevated 05/03/2013  . Abdominal pain 01/03/2013  . AKI (acute kidney injury) ([place]) 01/03/2013  . Protein-calorie malnutrition, moderate ([place]) 04/10/2012  . Umbilical hernia s/p primary repair 04/08/2012 03/30/2012  . Obesity (BMI 30-39.9) 01/08/2012  . 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 04/08/2012 12/26/2011  . UTI (urinary tract infection) 12/26/2011  . Chest pain, noncardiac - probably due to giant hiatal hernia 12/25/2011  . COPD (chronic obstructive pulmonary disease) ([place]) 12/24/2011  . Lymphopenia 08/23/2011  .  Hemolytic anemia ([place])   . Leukocytopenia   . HTN (hypertension)   . Asthma   . IBS (irritable bowel syndrome)   . Arthritis   . Anxiety   . Chronic kidney disease (CKD), stage III (moderate) (HCC)   . Chronic fatigue     Past Surgical History:  Procedure Laterality Date  . APPENDECTOMY  80  . arthroscopic knee surgery-left    . BREAST EXCISIONAL BIOPSY Right 1995  . BREAST SURGERY  04-06-12   rt. lumpectomy_benign  . CARDIAC  CATHETERIZATION  08/12/2019  . carpal tunnel right    . COLONOSCOPY, ESOPHAGOGASTRODUODENOSCOPY (EGD) AND ESOPHAGEAL DILATION     06/2013  . CORONARY ARTERY BYPASS GRAFT N/A 08/17/2019   Procedure: CORONARY ARTERY BYPASS GRAFTING (CABG) x 4 using LIMA to LAD(d) and endscopic right greater saphenous vein harvest: SVG to Diag1; SVG to OM1; SVG to PDA. Flowtrack: Only;  Surgeon: [name], MD;  Location: [facility];  Service: Open Heart Surgery;  Laterality: N/A;  . ENDOVEIN HARVEST OF GREATER SAPHENOUS VEIN Right 08/17/2019   Procedure: ENDOVEIN HARVEST OF GREATER SAPHENOUS VEIN;  Surgeon: [name], MD;  Location: [facility];  Service: Open Heart Surgery;  Laterality: Right;  . ESOPHAGOGASTRODUODENOSCOPY  12/26/2011   Procedure: ESOPHAGOGASTRODUODENOSCOPY (EGD);  Surgeon: Inda Castle, MD;  Location: [facility];  Service: Endoscopy;  Laterality: N/A;  . EYE SURGERY     cataracts  . HERNIA REPAIR  1970's   "stomach"   . LAPAROSCOPIC NISSEN FUNDOPLICATION N/A 06/06/6220   Procedure: Laparoscopic Reduction and Repair of Paraesophagel Hiatal Hernia with Nissen;  Surgeon: Adin Hector, MD;  Location: [facility];  Service: General;  Laterality: N/A;  Laparoscopic Reduction and Repair of Paraesophagel Hiatal Hernia with Nissen  . LEFT HEART CATH AND CORONARY ANGIOGRAPHY N/A 08/12/2019   Procedure: LEFT HEART CATH AND CORONARY ANGIOGRAPHY;  Surgeon: Jettie Booze, MD;  Location: [facility];  Service: Cardiovascular;  Laterality: N/A;  . NISSEN FUNDOPLICATION     10/7987  . TEE WITHOUT CARDIOVERSION N/A 08/17/2019   Procedure: TRANSESOPHAGEAL ECHOCARDIOGRAM (TEE);  Surgeon: [name], MD;  Location: [facility];  Service: Open Heart Surgery;  Laterality: N/A;  . TOTAL KNEE ARTHROPLASTY Right 05/19/2015   Procedure: RIGHT TOTAL KNEE ARTHROPLASTY;  Surgeon: Netta Cedars, MD;  Location: [facility];  Service: Orthopedics;  Laterality: Right;  . TUBAL LIGATION  1970's  . VAGINAL  HYSTERECTOMY  ~ 1980   right ovary     OB History   No obstetric history on file.     Family History  Problem Relation Age of Onset  . Diabetes Father   . Heart disease Father   . Breast cancer Maternal Aunt        in 41's    Social History   Tobacco Use  . Smoking status: Former Smoker    Packs/day: 1.00    Years: 40.00    Pack years: 40.00    Types: Cigarettes    Quit date: 02/19/2000    Years since quitting: 20.4  . Smokeless tobacco: Never Used  Vaping Use  . Vaping Use: Never used  Substance Use Topics  . Alcohol use: No  . Drug use: No    Home Medications Prior to Admission medications   Medication Sig Start Date End Date Taking? Authorizing Provider  acetaminophen (TYLENOL) 325 MG tablet Take 650 mg by mouth every 6 (six) hours as needed for mild pain or moderate pain.  [provider]  albuterol (PROVENTIL HFA;VENTOLIN HFA) 108 (90 Base) MCG/ACT inhaler Inhale 2 puffs into the lungs every 6 (six) hours as needed for wheezing or shortness of breath.    [provider]  bisacodyl (DULCOLAX) 10 MG suppository If not relieved by MOM, give 10 mg Bisacodyl suppositiory rectally X 1 dose in 24 hours as needed (Do not use constipation standing orders for residents with renal failure/CFR less than 30. Contact MD for orders) (Physician Order) 08/24/19   [provider]  Calcium Carbonate-Vitamin D3 600-400 MG-UNIT TABS Take 2 tablets by mouth daily.    [provider]  cephALEXin (KEFLEX) 500 MG capsule Take 1 capsule (500 mg total) by mouth 2 (two) times daily for 7 days. 07/16/20 07/23/20  Antonietta Breach, PA-C  cyanocobalamin 500 MCG tablet Take 500 mcg by mouth daily after breakfast. Vitamin B12    [provider]  folic acid (FOLVITE) 062 MCG tablet Take 400 mcg by mouth daily after breakfast.     [provider]  isosorbide dinitrate (ISORDIL) 30 MG tablet Take 30 mg by mouth daily.    [provider]  Menthol,  Topical Analgesic, (ICY HOT) 7.5 % (Roll) MISC Apply 1 each topically daily as needed (arthritis pain).    [provider]  metoprolol succinate (TOPROL-XL) 25 MG 24 hr tablet Take 25 mg by mouth daily.    [provider]  Multiple Vitamins-Minerals (PRESERVISION AREDS) TABS Take 1 tablet by mouth daily.    [provider]  NON FORMULARY Heart healthy diet 08/24/19   [provider]  ondansetron (ZOFRAN ODT) 4 MG disintegrating tablet Take 1 tablet (4 mg total) by mouth every 8 (eight) hours as needed for nausea or vomiting. 07/15/20   Joy, Shawn C, PA-C  ondansetron (ZOFRAN) 4 MG tablet Take 4 mg by mouth every 8 (eight) hours as needed for nausea or vomiting.    [provider]  predniSONE (DELTASONE) 10 MG tablet Take 4 tablets (40 mg total) by mouth daily for 5 days, THEN 3 tablets (30 mg total) daily for 1 day, THEN 2 tablets (20 mg total) daily for 1 day, THEN 1 tablet (10 mg total) daily for 1 day. 07/15/20 07/23/20  Joy, Shawn C, PA-C  rosuvastatin (CRESTOR) 20 MG tablet Take 1 tablet (20 mg total) by mouth daily. 09/09/18   Frann Rider, NP  Sodium Phosphates (RA SALINE ENEMA RE) Place rectally. If not relieved by Biscodyl suppository, give disposable Saline Enema rectally X 1 dose/24 hrs as needed (Do not use constipation standing orders for residents with renal failure/CFR less than 30. Contact MD for orders)(Physician Or 08/24/19   [provider]    Allergies    Penicillins, Brovana [arformoterol], and Paxil [paroxetine hcl]  Review of Systems   Review of Systems  Constitutional: Positive for appetite change, fatigue and fever.  Respiratory: Positive for cough and shortness of breath.   Cardiovascular: Negative for chest pain.  Gastrointestinal: Positive for abdominal pain. Negative for blood in stool, nausea and vomiting.  Genitourinary: Negative for dysuria.  Neurological: Negative for syncope.  All other systems reviewed and are  negative.   Physical Exam Updated Vital Signs Ht 5\' 2"  (1.575 m)   Wt 65 kg   SpO2 98%   BMI 26.21 kg/m   Physical Exam Vitals and nursing note reviewed.  Constitutional:      General: She is not in acute distress.    Appearance: She is well-developed. She is not  toxic-appearing.  HENT:     Head: Normocephalic and atraumatic.     Mouth/Throat:     Mouth: Mucous membranes are dry.  Eyes:     General:        Right eye: No discharge.        Left eye: No discharge.     Conjunctiva/sclera: Conjunctivae normal.  Cardiovascular:     Rate and Rhythm: Normal rate and regular rhythm.  Pulmonary:     Effort: Pulmonary effort is normal. No respiratory distress.     Breath sounds: Normal breath sounds. No wheezing, rhonchi or rales.     Comments: Resting comfortably on 2L via [place].  Abdominal:     General: There is no distension.     Palpations: Abdomen is soft.     Tenderness: There is abdominal tenderness (generalized). There is no guarding or rebound.  Musculoskeletal:     Cervical back: Neck supple.     Right lower leg: No edema.     Left lower leg: No edema.  Skin:    General: Skin is warm and dry.     Findings: No rash.  Neurological:     Mental Status: She is alert.     Comments: Clear speech.   Psychiatric:        Behavior: Behavior normal.     ED Results / Procedures / Treatments   Labs (all labs ordered are listed, but only abnormal results are displayed) Labs Reviewed  COMPREHENSIVE METABOLIC PANEL - Abnormal; Notable for the following components:      Result Value   Potassium 3.1 (*)    Glucose, Bld 125 (*)    Creatinine, Ser 1.17 (*)    Calcium 8.8 (*)    Albumin 3.2 (*)    Total Bilirubin 1.7 (*)    GFR, Estimated 47 (*)    All other components within normal limits  CBC WITH DIFFERENTIAL/PLATELET - Abnormal; Notable for the following components:   Lymphs Abs 0.5 (*)    All other components within normal limits  URINALYSIS, ROUTINE W REFLEX MICROSCOPIC  - Abnormal; Notable for the following components:   APPearance HAZY (*)    Specific Gravity, Urine 1.035 (*)    Leukocytes,Ua TRACE (*)    Bacteria, UA MANY (*)    All other components within normal limits  URINE CULTURE  LIPASE, BLOOD    EKG None  Radiology CT Abdomen Pelvis W Contrast  Result Date: 07/23/2020 CLINICAL DATA:  Cough, dyspnea, weakness, acute nonlocalized abdominal pain EXAM: CT ABDOMEN AND PELVIS WITH CONTRAST TECHNIQUE: Multidetector CT imaging of the abdomen and pelvis was performed using the standard protocol following bolus administration of intravenous contrast. CONTRAST:  111mL OMNIPAQUE IOHEXOL 300 MG/ML  SOLN COMPARISON:  07/21/2015 FINDINGS: Lower chest: The visualized lung bases are clear. Mild right coronary artery calcification. Global cardiac size within normal limits. Small hiatal hernia. Circumferential thickening of the distal esophagus is nonspecific but may reflect changes of esophagitis such as reflux esophagitis. Hepatobiliary: Cholelithiasis without pericholecystic inflammatory change noted. Mild hepatic steatosis. No enhancing intrahepatic mass identified. No intra or extrahepatic biliary ductal dilation. Pancreas: Unremarkable Spleen: Unremarkable Adrenals/Urinary Tract: The adrenal glands are unremarkable. The kidneys are normal in position. Mild left asymmetric renal cortical scarring and atrophy noted. No enhancing intrarenal masses. No intrarenal or ureteral calculi. No hydronephrosis. Gas is seen within the bladder lumen non dependently, possibly related to recent catheterization. A bladder diverticulum is again seen along the left lateral wall of the bladder, enlarged  since prior examination and suggestive of chronic bladder outlet obstruction. Stomach/Bowel: Extensive sigmoid diverticulosis. The stomach, small bowel, and large bowel are otherwise unremarkable. No evidence of obstruction or focal inflammation. The appendix is absent. No free  intraperitoneal gas or fluid. Vascular/Lymphatic: There is extensive aortoiliac atherosclerotic calcification. Particularly prominent atherosclerotic calcification likely results in greater than 50% stenoses of the renal arteries bilaterally, though the degree of stenosis is not optimally assessed on this non arteriographic study. No aortic aneurysm. No pathologic adenopathy within the abdomen and pelvis. Reproductive: Status post hysterectomy. No adnexal masses. Other: Tiny fat containing right inguinal hernia. Rectum is unremarkable. Musculoskeletal: Degenerative changes are seen within the lumbar spine. No lytic or blastic bone lesions are identified. There is extensive soft tissue thickening involving the proximal myotendinous junction of the hamstring, best seen on axial image # 84/2 without associated cortical disruption of the ischial tuberosity. This is most in keeping with a a proximal hamstring tendon strain or tear. IMPRESSION: No acute intra-abdominal pathology identified. Stable circumferential thickening of the distal esophagus, possibly related to changes of esophagitis such as reflux esophagitis. Small associated hiatal hernia again noted. Cholelithiasis. Mild hepatic steatosis. Small, but enlarging bladder diverticulum suggesting changes of a chronic bladder outlet obstruction. Gas within the bladder lumen may relate to recent catheterization. Extensive sigmoid diverticulosis. Peripheral vascular disease. Probable hemodynamically significant stenoses of the renal arteries bilaterally. Clinical correlation for signs and symptoms of clinically significant renal artery stenosis (hypertension, renal insufficiency, etc.) May be helpful for further management. If present, this could be better assessed with dedicated CT arteriography. Marked thickening and hyperemia involving the proximal myotendinous junction of the hamstring tendon suggesting strain or tear in this location. Aortic Atherosclerosis  (ICD10-I70.0). Electronically Signed   By: Fidela Salisbury MD   On: 07/23/2020 04:11   DG Chest Portable 1 View  Result Date: 07/22/2020 CLINICAL DATA:  Dyspnea EXAM: PORTABLE CHEST 1 VIEW COMPARISON:  07/15/2020 FINDINGS: The heart size and mediastinal contours are within normal limits. Both lungs are clear. The visualized skeletal structures are unremarkable. IMPRESSION: No active disease. Electronically Signed   By: Ulyses Jarred [name]   On: 07/22/2020 23:32    Procedures Procedures   Medications Ordered in ED Medications - No data to display  ED Course  I have reviewed the triage vital signs and the nursing notes.  Pertinent labs & imaging results that were available during my care of the patient were reviewed by me and considered in my medical decision making (see chart for details).    Nicole Meyer was evaluated in Emergency Department on 07/22/2020 for the symptoms described in the history of present illness. He/she was evaluated in the context of the global COVID-19 pandemic, which necessitated consideration that the patient might be at risk for infection with the SARS-CoV-2 virus that causes COVID-19. Institutional protocols and algorithms that pertain to the evaluation of patients at risk for COVID-19 are in a state of rapid change based on information released by regulatory bodies including the CDC and federal and state organizations. These policies and algorithms were followed during the patient's care in the ED.  MDM Rules/Calculators/A&P                          Patient presents to the ED with complaints of weakness/dyspnea in the setting of covid 19. Saturating appropriately on her baseline O2. Nontoxic, vitals w/ mildly elevated BP otherwise unremarkable.  Mild generalized abdominal tenderness noted.  Additional history obtained:  Additional history obtained from chart review & nursing note review.   Seen in the ED 07/15/20- found to have UTI & COVID, no increase in  baseline O2 requirement and was disharged home on Keflex for UTI in addition to Molnupiravir, Prednisone, Zofran.   Lab Tests:  I Ordered, reviewed, and interpreted labs, which included:  CBC: Unremarkable CMP: Mild hypokalemia.  Creatinine improved from prior. Lipase: Within normal limits Urinalysis: Many bacteria, however nitrite negative, no current urinary symptoms, given patient's age this was sent for culture.  Imaging Studies ordered:  I ordered imaging studies which included chest x-ray and CT abdomen/pelvis, I independently reviewed, formal radiology impression shows:  Chest x-ray: No active disease CT abdomen/pelvis:No acute intra-abdominal pathology identified. Stable circumferential thickening of the distal esophagus, possibly related to changes of esophagitis such as reflux esophagitis. Small associated hiatal hernia again noted. Cholelithiasis. Mild hepatic steatosis. Small, but enlarging bladder diverticulum suggesting changes of a chronic bladder outlet obstruction. Gas within the bladder lumen may relate to recent catheterization. Extensive sigmoid diverticulosis. Peripheral vascular disease. Probable hemodynamically significant stenoses of the renal arteries bilaterally. Clinical correlation for signs and symptoms of clinically significant renal artery stenosis (hypertension, renal insufficiency, etc.) May be helpful for further management. If present, this could be better assessed with dedicated CT arteriography. Marked thickening and hyperemia involving the proximal myotendinous junction of the hamstring tendon suggesting strain or tear in this location. Aortic Atherosclerosis  ED Course:  Multiple abnormalities noted on CT, no acute intra-abdominal pathology identified.  Abdomen is without focal tenderness or peritoneal signs.  Patient is tolerating p.o. in the emergency department.  I overall have a low suspicion for acute surgical abdominal pathology at this time.  Patient's  chest x-ray is clear.  She is not hypoxic on her baseline 2 L via nasal cannula, she is able to move throughout exam room hesitantly but with SPO2 maintaining greater than 95% on this oxygen requirement.  She is low risk [place], feel that a pulmonary embolism would be less likely at this time.  Her labs not show findings of significant electrolyte derangement, mild hypokalemia will be orally replaced, renal function without acute kidney injury.  Patient received some fluids and analgesics in the emergency department with some mild improvement.  Given she is able to ambulate, maintain her SPO2, and has had a overall reassuring work-up she appears appropriate for discharge.  She has received oral treatment for COVID-19 therefore no further intervention in this regard.  She still has 1 more day of steroid treatment, she is not currently wheezing on exam therefore do not feel that further steroids are necessary at this time.  We discussed close PCP follow-up and very strict return precautions. I discussed results, treatment plan, need for follow-up, and return precautions with the patient. Provided opportunity for questions, patient confirmed understanding and is in agreement with plan.   This is a shared visit with supervising physician Dr. Betsey Holiday who has independently evaluated patient & provided guidance, in agreement with care   Portions of this note were generated with Dragon dictation software. Dictation errors may occur despite best attempts at proofreading.  Final Clinical Impression(s) / ED Diagnoses Final diagnoses:  Ongoing symptomatic disease due to COVID-19 virus    Rx / DC Orders ED Discharge Orders         Ordered    benzonatate (TESSALON) 100 MG capsule  Every 8 hours        07/23/20 0617  famotidine (PEPCID) 10 MG tablet  2 times daily PRN        07/23/20 0617           Tyona Nilsen, Glynda Jaeger, PA-C 07/23/20 0618    Orpah Greek, MD 07/23/20 [phone]

## 2020-07-22 NOTE — ED Notes (Signed)
[phone] pts husband Eddie Dibbles will come pick up pt when she is ready for d/c

## 2020-07-22 NOTE — ED Triage Notes (Signed)
Pt to ED by EMS from home with c/o cough, sob and weakness onset of a week ago. Pt was diagnosed with covid 1 week ago, SOB began this evening per EMS. Arrives on2LNC. Arrives A+O, VSS, NADN.

## 2020-07-23 ENCOUNTER — Encounter (HOSPITAL_COMMUNITY): Payer: Self-pay

## 2020-07-23 ENCOUNTER — Emergency Department (HOSPITAL_COMMUNITY): Payer: Medicare PPO

## 2020-07-23 DIAGNOSIS — K760 Fatty (change of) liver, not elsewhere classified: Secondary | ICD-10-CM | POA: Diagnosis not present

## 2020-07-23 DIAGNOSIS — R109 Unspecified abdominal pain: Secondary | ICD-10-CM | POA: Diagnosis not present

## 2020-07-23 LAB — URINALYSIS, ROUTINE W REFLEX MICROSCOPIC
Bilirubin Urine: NEGATIVE
Glucose, UA: NEGATIVE mg/dL
Hgb urine dipstick: NEGATIVE
Ketones, ur: NEGATIVE mg/dL
Nitrite: NEGATIVE
Protein, ur: NEGATIVE mg/dL
Specific Gravity, Urine: 1.035 — ABNORMAL HIGH (ref 1.005–1.030)
pH: 6 (ref 5.0–8.0)

## 2020-07-23 MED ORDER — POTASSIUM CHLORIDE CRYS ER 20 MEQ PO TBCR
40.0000 meq | EXTENDED_RELEASE_TABLET | Freq: Once | ORAL | Status: AC
  Administered 2020-07-23: 40 meq via ORAL
  Filled 2020-07-23: qty 2

## 2020-07-23 MED ORDER — FENTANYL CITRATE (PF) 100 MCG/2ML IJ SOLN
50.0000 ug | Freq: Once | INTRAMUSCULAR | Status: AC
  Administered 2020-07-23: 50 ug via INTRAVENOUS
  Filled 2020-07-23: qty 2

## 2020-07-23 MED ORDER — IOHEXOL 300 MG/ML  SOLN
100.0000 mL | Freq: Once | INTRAMUSCULAR | Status: AC | PRN
  Administered 2020-07-23: 100 mL via INTRAVENOUS

## 2020-07-23 MED ORDER — SODIUM CHLORIDE (PF) 0.9 % IJ SOLN
INTRAMUSCULAR | Status: AC
  Filled 2020-07-23: qty 50

## 2020-07-23 MED ORDER — FAMOTIDINE 10 MG PO TABS: 10.0000 mg | ORAL_TABLET | Freq: Two times a day (BID) | ORAL | 0 refills | Status: AC | PRN

## 2020-07-23 MED ORDER — BENZONATATE 100 MG PO CAPS: 100.0000 mg | ORAL_CAPSULE | Freq: Three times a day (TID) | ORAL | 0 refills | Status: AC

## 2020-07-23 NOTE — Discharge Instructions (Addendum)
You were seen in the emergency department today due to shortness of breath, weakness, and abdominal pain.  Your work-up was overall reassuring.  Your potassium was mildly low, please see attached diet guidelines to incorporate potassium rich foods into your diet.  Otherwise your blood work was fairly similar to prior labs you have had done.  Your CT scan showed several findings that have been seen on prior imaging, you do have some thickening of your esophagus, gallstones, fatty liver changes, a diverticulum (outpouching) of your bladder, some plaque in your blood vessels, as well as some irritation/injury to your hamstring.  Please discuss your CT scan with your primary care provider.  We are sending home with Tessalon to take every 8 hours as needed for coughing as well as Pepcid to take every 12 hours as needed for abdominal discomfort.  Please continue to use your inhalers at home as needed.   We have prescribed you new medication(s) today. Discuss the medications prescribed today with your pharmacist as they can have adverse effects and interactions with your other medicines including over the counter and prescribed medications. Seek medical evaluation if you start to experience new or abnormal symptoms after taking one of these medicines, seek care immediately if you start to experience difficulty breathing, feeling of your throat closing, facial swelling, or rash as these could be indications of a more serious allergic reaction   Please follow up with your primary care provider or [facility] within the next 1 to 3 days.  Return to the ER for new or worsening symptoms or any other concerns.

## 2020-07-23 NOTE — ED Notes (Signed)
Attempted to call pt's daughter who will be picking pt up x 3, no answer.

## 2020-07-23 NOTE — ED Notes (Signed)
Patient transported to CT 

## 2020-07-23 NOTE — ED Notes (Signed)
Ambulated pt to the beside commode. Pt became dizzy and lightheaded. Pts O2 was about 95 the whole time.  Was unable to ambulate in the room with walker.

## 2020-07-23 NOTE — ED Notes (Signed)
Spoke with pt's husband. States pt's daughter is on the way to pick her up in about 30 minutes

## 2020-07-25 LAB — URINE CULTURE: Culture: >=100000 — AB

## 2020-07-26 ENCOUNTER — Telehealth: Payer: Self-pay | Admitting: Emergency Medicine

## 2020-07-26 NOTE — Telephone Encounter (Signed)
Post ED Visit - Positive Culture Follow-up: Successful Patient Follow-Up  Culture assessed and recommendations reviewed by:  []  Elenor Quinones, Pharm.D. []  Heide Guile, Pharm.D., BCPS AQ-ID []  Parks Neptune, Pharm.D., BCPS []  Alycia Rossetti, Pharm.D., BCPS []  Robbins, Pharm.D., BCPS, AAHIVP []  Legrand Como, Pharm.D., BCPS, AAHIVP []  Salome Arnt, PharmD, BCPS []  Johnnette Gourd, PharmD, BCPS []  Hughes Better, PharmD, BCPS []  Leeroy Cha, PharmD  Positive urine culture  []  Patient discharged without antimicrobial prescription and treatment is now indicated []  Organism is resistant to prescribed ED discharge antimicrobial []  Patient with positive blood cultures  Changes discussed with ED provider: Suella Broad PA New antibiotic prescription symptom check, if reports symptoms, give nitrofurantoin 100 mg po x 5 days If no urinary symptoms, no treatment necessary Denies urinary symptoms  Contacted patient   Hazle Nordmann 07/26/2020, 10:02 AM

## 2020-07-26 NOTE — Progress Notes (Signed)
ED Antimicrobial Stewardship Positive Culture Follow Up   Nicole Meyer is an 80 y.o. female who presented to [facility] on 07/22/2020 with a chief complaint of weakness Chief Complaint  Patient presents with  . Weakness    Recent Results (from the past 720 hour(s))  Culture, blood (Routine x 2)     Status: None   Collection Time: 07/15/20  7:05 PM   Specimen: BLOOD  Result Value Ref Range Status   Specimen Description BLOOD LEFT ANTECUBITAL  Final   Special Requests   Final    BOTTLES DRAWN AEROBIC AND ANAEROBIC Blood Culture results may not be optimal due to an inadequate volume of blood received in culture bottles   Culture   Final    NO GROWTH 5 DAYS Performed at [facility], [place] [name], [place], [place] [address]    Report Status 07/20/2020 FINAL  Final  Culture, blood (Routine x 2)     Status: None   Collection Time: 07/15/20  8:22 PM   Specimen: BLOOD RIGHT WRIST  Result Value Ref Range Status   Specimen Description BLOOD RIGHT WRIST  Final   Special Requests   Final    BOTTLES DRAWN AEROBIC AND ANAEROBIC Blood Culture results may not be optimal due to an inadequate volume of blood received in culture bottles   Culture   Final    NO GROWTH 5 DAYS Performed at [facility], [place] [address] [place], [place] [address]    Report Status 07/20/2020 FINAL  Final  Resp Panel by RT-PCR (Flu A&B, Covid) Nasopharyngeal Swab     Status: Abnormal   Collection Time: 07/15/20  8:22 PM   Specimen: Nasopharyngeal Swab; Nasopharyngeal(NP) swabs in vial transport medium  Result Value Ref Range Status   SARS Coronavirus 2 by RT PCR POSITIVE (A) NEGATIVE Final    Comment: RESULT CALLED TO, READ BACK BY AND VERIFIED WITH: RN SULLIVAN ERIKA BY MESSAN H. AT 2226 ON 07/15/2020 (NOTE) SARS-CoV-2 target nucleic acids are DETECTED.  The SARS-CoV-2 RNA is generally detectable in upper respiratory specimens during the acute phase of infection. Positive results are indicative  of the presence of the identified virus, but do not rule out bacterial infection or co-infection with other pathogens not detected by the test. Clinical correlation with patient history and other diagnostic information is necessary to determine patient infection status. The expected result is Negative.  Fact Sheet for Patients: EntrepreneurPulse.com.au  Fact Sheet for Healthcare Providers: IncredibleEmployment.be  This test is not yet approved or cleared by the [place] FDA and  has been authorized for detection and/or diagnosis of SARS-CoV-2 by FDA under an Emergency Use Authorization (EUA).  This EUA will remain in effect (meanin g this test can be used) for the duration of  the COVID-19 declaration under Section 564(b)(1) of the Act, 21 U.S.C. section 360bbb-3(b)(1), unless the authorization is terminated or revoked sooner.     Influenza A by PCR NEGATIVE NEGATIVE Final   Influenza B by PCR NEGATIVE NEGATIVE Final    Comment: (NOTE) The Xpert Xpress SARS-CoV-2/FLU/RSV plus assay is intended as an aid in the diagnosis of influenza from Nasopharyngeal swab specimens and should not be used as a sole basis for treatment. Nasal washings and aspirates are unacceptable for Xpert Xpress SARS-CoV-2/FLU/RSV testing.  Fact Sheet for Patients: EntrepreneurPulse.com.au  Fact Sheet for Healthcare Providers: IncredibleEmployment.be  This test is not yet approved or cleared by the [place] and has been authorized for  detection and/or diagnosis of SARS-CoV-2 by FDA under an Emergency Use Authorization (EUA). This EUA will remain in effect (meaning this test can be used) for the duration of the COVID-19 declaration under Section 564(b)(1) of the Act, 21 U.S.C. section 360bbb-3(b)(1), unless the authorization is terminated or revoked.  Performed at [facility], [place] [address] [place],  [place] [address]   Urine culture     Status: Abnormal   Collection Time: 07/16/20 12:15 AM   Specimen: Urine, Catheterized  Result Value Ref Range Status   Specimen Description URINE, CATHETERIZED  Final   Special Requests   Final    NONE Performed at [facility], [place] [address] [place], [place] [address]    Culture (A)  Final    >=100,000 COLONIES/mL ESCHERICHIA COLI Confirmed Extended Spectrum Beta-Lactamase Producer (ESBL).  In bloodstream infections from ESBL organisms, carbapenems are preferred over piperacillin/tazobactam. They are shown to have a lower risk of mortality.    Report Status 07/18/2020 FINAL  Final   Organism ID, Bacteria ESCHERICHIA COLI (A)  Final      Susceptibility   Escherichia coli - MIC*    AMPICILLIN >=32 RESISTANT Resistant     CEFAZOLIN >=64 RESISTANT Resistant     CEFEPIME 16 RESISTANT Resistant     CEFTRIAXONE >=64 RESISTANT Resistant     CIPROFLOXACIN >=4 RESISTANT Resistant     GENTAMICIN <=1 SENSITIVE Sensitive     IMIPENEM <=0.25 SENSITIVE Sensitive     NITROFURANTOIN <=16 SENSITIVE Sensitive     TRIMETH/SULFA >=320 RESISTANT Resistant     AMPICILLIN/SULBACTAM 16 INTERMEDIATE Intermediate     PIP/TAZO <=4 SENSITIVE Sensitive     * >=100,000 COLONIES/mL ESCHERICHIA COLI  Urine culture     Status: Abnormal   Collection Time: 07/23/20  5:10 AM   Specimen: Urine, Clean Catch  Result Value Ref Range Status   Specimen Description   Final    URINE, CLEAN CATCH Performed at [facility], [place] [address], [place], [place] [address]    Special Requests   Final    NONE Performed at [facility], [place] [address], [place], [place] [address]    Culture (A)  Final    >=100,000 COLONIES/mL ESCHERICHIA COLI Confirmed Extended Spectrum Beta-Lactamase Producer (ESBL).  In bloodstream infections from ESBL organisms, carbapenems are preferred over piperacillin/tazobactam. They are shown to have a lower risk of  mortality.    Report Status 07/25/2020 FINAL  Final   Organism ID, Bacteria ESCHERICHIA COLI (A)  Final      Susceptibility   Escherichia coli - MIC*    AMPICILLIN >=32 RESISTANT Resistant     CEFAZOLIN >=64 RESISTANT Resistant     CEFEPIME >=32 RESISTANT Resistant     CEFTRIAXONE >=64 RESISTANT Resistant     CIPROFLOXACIN >=4 RESISTANT Resistant     GENTAMICIN <=1 SENSITIVE Sensitive     IMIPENEM <=0.25 SENSITIVE Sensitive     NITROFURANTOIN <=16 SENSITIVE Sensitive     TRIMETH/SULFA >=320 RESISTANT Resistant     AMPICILLIN/SULBACTAM >=32 RESISTANT Resistant     PIP/TAZO >=128 RESISTANT Resistant     * >=100,000 COLONIES/mL ESCHERICHIA COLI   79 YOF presented w/ complaints of continued dyspea (worse w/ activity)/weakness x1 wk. Feels persistently weak, has SOB, has dry cough, fevers, and started having constant generalized abdominal pain. Denies vomiting or dysuria. COVID positive. UA: trace leukocytes, WBC 11-20  Presented to ED on 5/28 with similar symptoms and tested positive for COVID. Denied  any urinary symptoms. D/c on Keflex. Culture came back as ESBL E. Coli. Symptom check performed 6/1 and pt reported no symptoms. Switched to fosfomycin 3 g PO x2 doses.    Plan: Call patient for second symptom check (pain while peeing, increased frequency/urgency). If reported symptoms, give nitrofurantoin 100 mg PO BID x5 days. If no reported symptoms, no treatment necessary.  ED Provider: Suella Broad, PA-C  Debria Garret, Student Pharmacist 07/26/2020, 8:51 AM

## 2020-07-27 ENCOUNTER — Telehealth: Payer: Self-pay

## 2020-07-27 ENCOUNTER — Telehealth: Payer: Self-pay | Admitting: *Deleted

## 2020-07-28 ENCOUNTER — Emergency Department (HOSPITAL_COMMUNITY)
Admission: EM | Admit: 2020-07-28 | Discharge: 2020-07-31 | Disposition: A | Discharge status: Home / Self Care | Payer: Medicare PPO | Attending: Emergency Medicine | Admitting: Emergency Medicine

## 2020-07-28 ENCOUNTER — Emergency Department (HOSPITAL_COMMUNITY): Payer: Medicare PPO

## 2020-07-28 ENCOUNTER — Other Ambulatory Visit: Payer: Self-pay

## 2020-07-28 DIAGNOSIS — J449 Chronic obstructive pulmonary disease, unspecified: Secondary | ICD-10-CM | POA: Insufficient documentation

## 2020-07-28 DIAGNOSIS — Z87891 Personal history of nicotine dependence: Secondary | ICD-10-CM | POA: Diagnosis not present

## 2020-07-28 DIAGNOSIS — I700 Atherosclerosis of aorta: Secondary | ICD-10-CM | POA: Diagnosis not present

## 2020-07-28 DIAGNOSIS — M545 Low back pain, unspecified: Secondary | ICD-10-CM | POA: Diagnosis not present

## 2020-07-28 DIAGNOSIS — J45909 Unspecified asthma, uncomplicated: Secondary | ICD-10-CM | POA: Insufficient documentation

## 2020-07-28 DIAGNOSIS — I25119 Atherosclerotic heart disease of native coronary artery with unspecified angina pectoris: Secondary | ICD-10-CM | POA: Insufficient documentation

## 2020-07-28 DIAGNOSIS — M25551 Pain in right hip: Secondary | ICD-10-CM | POA: Insufficient documentation

## 2020-07-28 DIAGNOSIS — N183 Chronic kidney disease, stage 3 unspecified: Secondary | ICD-10-CM | POA: Insufficient documentation

## 2020-07-28 DIAGNOSIS — Z79899 Other long term (current) drug therapy: Secondary | ICD-10-CM | POA: Diagnosis not present

## 2020-07-28 DIAGNOSIS — W19XXXA Unspecified fall, initial encounter: Secondary | ICD-10-CM | POA: Insufficient documentation

## 2020-07-28 DIAGNOSIS — Z951 Presence of aortocoronary bypass graft: Secondary | ICD-10-CM | POA: Insufficient documentation

## 2020-07-28 DIAGNOSIS — M546 Pain in thoracic spine: Secondary | ICD-10-CM | POA: Diagnosis not present

## 2020-07-28 DIAGNOSIS — Z85828 Personal history of other malignant neoplasm of skin: Secondary | ICD-10-CM | POA: Insufficient documentation

## 2020-07-28 DIAGNOSIS — U071 COVID-19: Secondary | ICD-10-CM | POA: Insufficient documentation

## 2020-07-28 DIAGNOSIS — M25511 Pain in right shoulder: Secondary | ICD-10-CM | POA: Diagnosis not present

## 2020-07-28 DIAGNOSIS — I10 Essential (primary) hypertension: Secondary | ICD-10-CM | POA: Diagnosis not present

## 2020-07-28 DIAGNOSIS — I129 Hypertensive chronic kidney disease with stage 1 through stage 4 chronic kidney disease, or unspecified chronic kidney disease: Secondary | ICD-10-CM | POA: Insufficient documentation

## 2020-07-28 DIAGNOSIS — Z96651 Presence of right artificial knee joint: Secondary | ICD-10-CM | POA: Insufficient documentation

## 2020-07-28 DIAGNOSIS — M25552 Pain in left hip: Secondary | ICD-10-CM | POA: Diagnosis not present

## 2020-07-28 DIAGNOSIS — M549 Dorsalgia, unspecified: Secondary | ICD-10-CM | POA: Diagnosis not present

## 2020-07-28 DIAGNOSIS — Z043 Encounter for examination and observation following other accident: Secondary | ICD-10-CM | POA: Diagnosis not present

## 2020-07-28 LAB — URINALYSIS, ROUTINE W REFLEX MICROSCOPIC
Bacteria, UA: NONE SEEN
Bilirubin Urine: NEGATIVE
Glucose, UA: NEGATIVE mg/dL
Ketones, ur: NEGATIVE mg/dL
Nitrite: NEGATIVE
Protein, ur: 30 mg/dL — AB
Specific Gravity, Urine: 1.014 (ref 1.005–1.030)
pH: 6 (ref 5.0–8.0)

## 2020-07-28 LAB — CBC WITH DIFFERENTIAL/PLATELET
Abs Immature Granulocytes: 0.1 10*3/uL — ABNORMAL HIGH (ref 0.00–0.07)
Basophils Absolute: 0 10*3/uL (ref 0.0–0.1)
Basophils Relative: 0 %
Eosinophils Absolute: 0.1 10*3/uL (ref 0.0–0.5)
Eosinophils Relative: 1 %
HCT: 36.7 % (ref 36.0–46.0)
Hemoglobin: 12.2 g/dL (ref 12.0–15.0)
Immature Granulocytes: 1 %
Lymphocytes Relative: 5 %
Lymphs Abs: 0.4 10*3/uL — ABNORMAL LOW (ref 0.7–4.0)
MCH: 28.9 pg (ref 26.0–34.0)
MCHC: 33.2 g/dL (ref 30.0–36.0)
MCV: 87 fL (ref 80.0–100.0)
Monocytes Absolute: 0.3 10*3/uL (ref 0.1–1.0)
Monocytes Relative: 4 %
Neutro Abs: 8 10*3/uL — ABNORMAL HIGH (ref 1.7–7.7)
Neutrophils Relative %: 89 %
Platelets: 180 10*3/uL (ref 150–400)
RBC: 4.22 MIL/uL (ref 3.87–5.11)
RDW: 13.8 % (ref 11.5–15.5)
WBC: 8.9 10*3/uL (ref 4.0–10.5)
nRBC: 0 % (ref 0.0–0.2)

## 2020-07-28 LAB — BASIC METABOLIC PANEL
Anion gap: 11 (ref 5–15)
BUN: 15 mg/dL (ref 8–23)
CO2: 22 mmol/L (ref 22–32)
Calcium: 9.4 mg/dL (ref 8.9–10.3)
Chloride: 101 mmol/L (ref 98–111)
Creatinine, Ser: 1.18 mg/dL — ABNORMAL HIGH (ref 0.44–1.00)
GFR, Estimated: 47 mL/min — ABNORMAL LOW (ref >60)
Glucose, Bld: 125 mg/dL — ABNORMAL HIGH (ref 70–99)
Potassium: 3.6 mmol/L (ref 3.5–5.1)
Sodium: 134 mmol/L — ABNORMAL LOW (ref 135–145)

## 2020-07-28 MED ORDER — ACETAMINOPHEN 325 MG PO TABS
650.0000 mg | ORAL_TABLET | Freq: Once | ORAL | Status: AC
  Administered 2020-07-28: 650 mg via ORAL
  Filled 2020-07-28: qty 2

## 2020-07-28 NOTE — Progress Notes (Signed)
..  Transition of Care [facility]) - Emergency Department Mini Assessment   Patient Details  Name: Nicole Meyer MRN: [id number] Date of Birth: Jun 18, 1940  Transition of Care [facility]) CM/SW Contact:    Joson Sapp C Tarpley-Carter, [place] Phone Number: 07/28/2020, 11:58 AM   Clinical Narrative: TOC CSW spoke with pt at bedside.  Pt is in need of SNF.  Pt is unable to care for herself.  Pts husband/Paul Henneman lives in the home with pt, but is unable to care for pt due to his age and medical conditions.  Pt and pts husband agrees that SNF would be best.   Fajr Fife Tarpley-Carter, MSW, LCSW-A Pronouns:  She, Her, [place] ED Transitions of [place] Worker [email or web address] [phone]    ED Mini Assessment: What brought you to the Emergency Department? : Frequent falls  Barriers to Discharge: No Barriers Identified     Means of departure: Not know  Interventions which prevented an admission or readmission: SNF Placement    Patient Contact and Communications Key Contact 1: [place] with: [name] Contact Date: 07/28/20,     Contact Phone Number: [phone] Call outcome: Pt is in need of a SNF to assist with care.  Husband lives in the house with pt, but unable to care for her due to his age.  Patient states their goals for this hospitalization and ongoing recovery are:: Pt would like to go to SNF. CMS Medicare.gov Compare Post Acute Care list provided to:: Patient Choice offered to / list presented to : Patient  Admission diagnosis:  Back Pain Patient Active Problem List   Diagnosis Date Noted   Physical deconditioning 09/07/2019   Right calf pain 09/07/2019   Hypoxemia 09/07/2019   Coronary artery disease 08/17/2019   Atherosclerosis of coronary artery of native heart with unstable angina pectoris ([place]) 08/12/2019   Unstable angina ([place]) 08/10/2019   PAD (peripheral artery disease) ([place]) 02/28/2018    Lumbar stenosis 02/28/2018   Chronic anemia 02/28/2018   Acute CVA (cerebrovascular accident) ([place]) 02/27/2018   S/P total knee replacement using cement 05/19/2015   Chronic joint pain 08/30/2013   Serum total bilirubin elevated 05/03/2013   Abdominal pain 01/03/2013   AKI (acute kidney injury) ([place]) 01/03/2013   Protein-calorie malnutrition, moderate ([place]) 65/99/3570   Umbilical hernia s/p primary repair 04/08/2012 03/30/2012   Obesity (BMI 30-39.9) 01/08/2012   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 04/08/2012 12/26/2011   UTI (urinary tract infection) 12/26/2011   Chest pain, noncardiac - probably due to giant hiatal hernia 12/25/2011   COPD (chronic obstructive pulmonary disease) ([place]) 12/24/2011   Lymphopenia 08/23/2011   Hemolytic anemia (HCC)    Leukocytopenia    HTN (hypertension)    Asthma    IBS (irritable bowel syndrome)    Arthritis    Anxiety    Chronic kidney disease (CKD), stage III (moderate) ([place])    Chronic fatigue    PCP:  [name], MD Pharmacy:   [facility] [address] [place] [address] Phone: [phone] Fax: [phone]

## 2020-07-28 NOTE — Evaluation (Signed)
Physical Therapy Evaluation Patient Details Name: Nicole Meyer MRN: [id number] DOB: 1940/09/07 Today's Date: 07/28/2020   History of Present Illness  Pt admitted s/p falls at home with c/o R back pain - no fx per imaging.  Pt with recent hx of COVID and reports weakness and falls at home since.  Pt with additional hx of CKD, COPD, CAD< CABG and R TKR  Clinical Impression  Pt admitted as above and presenting with functional mobility limitations 2* generalized weakness and severe low R back pain with attempts to mobilize.  Pt currently requiring significant assist for performance of all mobility tasks.  This date, pt to EOB sitting only but unable to proceed further 2* elevated pain level.  Pt  would greatly benefit from follow up rehab at SNF level to maximize IND and safety prior to return home with limited assist.    Follow Up Recommendations SNF    Equipment Recommendations  None recommended by PT    Recommendations for Other Services       Precautions / Restrictions Precautions Precautions: Fall Restrictions Weight Bearing Restrictions: No Other Position/Activity Restrictions: WBAT      Mobility  Bed Mobility Overal bed mobility: Needs Assistance Bed Mobility: Rolling;Sidelying to Sit;Sit to Sidelying Rolling: Min assist;Mod assist Sidelying to sit: Mod assist;+2 for physical assistance;+2 for safety/equipment     Sit to sidelying: Mod assist;+2 for physical assistance;+2 for safety/equipment General bed mobility comments: CUes for log roll technique and physical assist to manage LEs on/off bed and control trunk    Transfers                 General transfer comment: NT - pt unable to continue beyond sitting EOB 2* pain  Ambulation/Gait                Stairs            Wheelchair Mobility    Modified Rankin (Stroke Patients Only)       Balance Overall balance assessment: Needs assistance Sitting-balance support: Bilateral upper  extremity supported Sitting balance-Leahy Scale: Fair                                       Pertinent Vitals/Pain Pain Assessment: 0-10 Pain Score: 8  Pain Location: back Pain Descriptors / Indicators: Grimacing;Guarding;Other (Comment) (tearful) Pain Intervention(s): Limited activity within patient's tolerance;Monitored during session;Patient requesting pain meds-RN notified    Home Living Family/patient expects to be discharged to:: Private residence Living Arrangements: Spouse/significant other Available Help at Discharge: Family;Available 24 hours/day Type of Home: House Home Access: Ramped entrance     Home Layout: One level Home Equipment: Walker - 4 wheels;Cane - single point;Tub bench Additional Comments: Pt home with spouse and states he has only one leg    Prior Function Level of Independence: Independent with assistive device(s)         Comments: using rollator for support     Hand Dominance   Dominant Hand: Right    Extremity/Trunk Assessment   Upper Extremity Assessment Upper Extremity Assessment: Generalized weakness    Lower Extremity Assessment Lower Extremity Assessment: Generalized weakness    Cervical / Trunk Assessment Cervical / Trunk Assessment: Kyphotic  Communication   Communication: No difficulties  Cognition Arousal/Alertness: Awake/alert Behavior During Therapy: WFL for tasks assessed/performed Overall Cognitive Status: Within Functional Limits for tasks assessed  General Comments      Exercises     Assessment/Plan    PT Assessment Patient needs continued PT services  PT Problem List Decreased strength;Decreased activity tolerance;Decreased balance;Decreased mobility;Decreased knowledge of use of DME;Pain       PT Treatment Interventions DME instruction;Gait training;Functional mobility training;Therapeutic activities;Therapeutic exercise;Balance  training;Patient/family education    PT Goals (Current goals can be found in the Care Plan section)  Acute Rehab PT Goals Patient Stated Goal: Regain IND PT Goal Formulation: With patient Time For Goal Achievement: 08/11/20 Potential to Achieve Goals: Fair    Frequency Min 3X/week   Barriers to discharge Decreased caregiver support Home with elderly spouse who is LE amputee`    Co-evaluation               AM-PAC PT "6 Clicks" Mobility  Outcome Measure Help needed turning from your back to your side while in a flat bed without using bedrails?: A Lot Help needed moving from lying on your back to sitting on the side of a flat bed without using bedrails?: A Lot Help needed moving to and from a bed to a chair (including a wheelchair)?: A Lot Help needed standing up from a chair using your arms (e.g., wheelchair or bedside chair)?: A Lot Help needed to walk in hospital room?: Total Help needed climbing 3-5 steps with a railing? : Total 6 Click Score: 10    End of Session   Activity Tolerance: Patient limited by pain Patient left: in bed;with call bell/phone within reach Nurse Communication: Mobility status;Patient requests pain meds PT Visit Diagnosis: Muscle weakness (generalized) (M62.81);Difficulty in walking, not elsewhere classified (R26.2);Pain;History of falling (Z91.81) Pain - Right/Left: Right Pain - part of body:  (back)    Time: 3545-6256 PT Time Calculation (min) (ACUTE ONLY): 21 min   Charges:   PT Evaluation $PT Eval Low Complexity: 1 Low          [place] Pager [phone] Office [phone]   Sherilynn Dieu 07/28/2020, 1:12 PM

## 2020-07-28 NOTE — ED Notes (Signed)
Pt. Did not want lunch

## 2020-07-28 NOTE — ED Provider Notes (Signed)
[facility] DEPT Provider Note   CSN: [id number] Arrival date & time: 07/28/20  4098     History Chief Complaint  Patient presents with   Nicole Meyer is a 80 y.o. female.  HPI 80 year old female with sperocytosis, CKD stage III, COPD, GERD, gout, hypertension, hypercholesteremia with a recent COVID diagnosis 12 days ago presents to the ER with complaints of a fall.  Patient states that she fell onto her right hip.  Has pain in her right hip and low back.  Denies any dizziness or chest pain at the time of the fall.  Denies hitting her head.  She states that she has been falling frequently due to significant weakness.  She lives at home with her husband.  She denies any numbness or tingling her extremities.  No fevers or chills.  She complains of significant weakness.    Past Medical History:  Diagnosis Date   Anxiety    Arthritis    "knees and back" (02/12/2013)   Arthritis    Asthma    no problems recently   Blood dyscrasia    sperocytosis   Cancer ([place]) 04-06-12   skin cancer nasal bridge-no problems now   Chronic kidney disease (CKD), stage III (moderate) (HCC)    COPD (chronic obstructive pulmonary disease) (HCC)    Coronary artery disease    Depression    Diverticulitis    Fatigue    GERD (gastroesophageal reflux disease)    Gout 04/05/2013   H/O hiatal hernia    History of blood transfusion    "2 units in 12/2012; suppose to get 1 unit today" (02/12/2013)   HTN (hypertension)    Hyperbilirubinemia 05/03/2013   Hypercalcemia    Hypercholesteremia    "above borderline; I don't take RX for it" (12/26/2011)   IBS (irritable bowel syndrome)    ibs   Iron deficiency anemia    [facility]   Leukocytopenia    Lumbar spinal stenosis    Lymphopenia 08/23/2011   Osteoarthritis    Osteoporosis    PMR (polymyalgia rheumatica) (HCC)    pmr   Renal insufficiency    Syncope and collapse 12/24/2011    "loss of consciousness for 3-4 min" (12/26/2011)   UTI (urinary tract infection) 12/26/2011    Patient Active Problem List   Diagnosis Date Noted   Physical deconditioning 09/07/2019   Right calf pain 09/07/2019   Hypoxemia 09/07/2019   Coronary artery disease 08/17/2019   Atherosclerosis of coronary artery of native heart with unstable angina pectoris ([place]) 08/12/2019   Unstable angina ([place]) 08/10/2019   PAD (peripheral artery disease) ([place]) 02/28/2018   Lumbar stenosis 02/28/2018   Chronic anemia 02/28/2018   Acute CVA (cerebrovascular accident) ([place]) 02/27/2018   S/P total knee replacement using cement 05/19/2015   Chronic joint pain 08/30/2013   Serum total bilirubin elevated 05/03/2013   Abdominal pain 01/03/2013   AKI (acute kidney injury) ([place]) 01/03/2013   Protein-calorie malnutrition, moderate ([place]) 11/91/4782   Umbilical hernia s/p primary repair 04/08/2012 03/30/2012   Obesity (BMI 30-39.9) 01/08/2012   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 04/08/2012 12/26/2011   UTI (urinary tract infection) 12/26/2011   Chest pain, noncardiac - probably due to giant hiatal hernia 12/25/2011   COPD (chronic obstructive pulmonary disease) ([place]) 12/24/2011   Lymphopenia 08/23/2011   Hemolytic anemia (HCC)    Leukocytopenia    HTN (hypertension)    Asthma  IBS (irritable bowel syndrome)    Arthritis    Anxiety    Chronic kidney disease (CKD), stage III (moderate) (HCC)    Chronic fatigue     Past Surgical History:  Procedure Laterality Date   APPENDECTOMY  79   arthroscopic knee surgery-left     BREAST EXCISIONAL BIOPSY Right 1995   BREAST SURGERY  04-06-12   rt. lumpectomy_benign   CARDIAC CATHETERIZATION  08/12/2019   carpal tunnel right     COLONOSCOPY, ESOPHAGOGASTRODUODENOSCOPY (EGD) AND ESOPHAGEAL DILATION     06/2013   CORONARY ARTERY BYPASS GRAFT N/A 08/17/2019   Procedure: CORONARY ARTERY BYPASS GRAFTING (CABG) x 4 using LIMA to LAD(d) and  endscopic right greater saphenous vein harvest: SVG to Diag1; SVG to OM1; SVG to PDA. Flowtrack: Only;  Surgeon: [name], MD;  Location: [facility];  Service: Open Heart Surgery;  Laterality: N/A;   ENDOVEIN HARVEST OF GREATER SAPHENOUS VEIN Right 08/17/2019   Procedure: ENDOVEIN HARVEST OF GREATER SAPHENOUS VEIN;  Surgeon: [name], MD;  Location: [facility];  Service: Open Heart Surgery;  Laterality: Right;   ESOPHAGOGASTRODUODENOSCOPY  12/26/2011   Procedure: ESOPHAGOGASTRODUODENOSCOPY (EGD);  Surgeon: Inda Castle, MD;  Location: [facility];  Service: Endoscopy;  Laterality: N/A;   EYE SURGERY     cataracts   HERNIA REPAIR  1970's   "stomach"    LAPAROSCOPIC NISSEN FUNDOPLICATION N/A 07/05/6158   Procedure: Laparoscopic Reduction and Repair of Paraesophagel Hiatal Hernia with Nissen;  Surgeon: Adin Hector, MD;  Location: [facility];  Service: General;  Laterality: N/A;  Laparoscopic Reduction and Repair of Paraesophagel Hiatal Hernia with Nissen   LEFT HEART CATH AND CORONARY ANGIOGRAPHY N/A 08/12/2019   Procedure: LEFT HEART CATH AND CORONARY ANGIOGRAPHY;  Surgeon: Jettie Booze, MD;  Location: [facility];  Service: Cardiovascular;  Laterality: N/A;   NISSEN FUNDOPLICATION     08/3708   TEE WITHOUT CARDIOVERSION N/A 08/17/2019   Procedure: TRANSESOPHAGEAL ECHOCARDIOGRAM (TEE);  Surgeon: [name], MD;  Location: [facility];  Service: Open Heart Surgery;  Laterality: N/A;   TOTAL KNEE ARTHROPLASTY Right 05/19/2015   Procedure: RIGHT TOTAL KNEE ARTHROPLASTY;  Surgeon: Netta Cedars, MD;  Location: [facility];  Service: Orthopedics;  Laterality: Right;   TUBAL LIGATION  1970's   VAGINAL HYSTERECTOMY  ~ 1980   right ovary     OB History   No obstetric history on file.     Family History  Problem Relation Age of Onset   Diabetes Father    Heart disease Father    Breast cancer Maternal Aunt        in 64's    Social History   Tobacco Use   Smoking  status: Former    Packs/day: 1.00    Years: 40.00    Pack years: 40.00    Types: Cigarettes    Quit date: 02/19/2000    Years since quitting: 20.4   Smokeless tobacco: Never  Vaping Use   Vaping Use: Never used  Substance Use Topics   Alcohol use: No   Drug use: No    Home Medications Prior to Admission medications   Medication Sig Start Date End Date Taking? Authorizing Provider  acetaminophen (TYLENOL) 325 MG tablet Take 650 mg by mouth every 6 (six) hours as needed for mild pain or moderate pain.    [provider]  albuterol (PROVENTIL HFA;VENTOLIN HFA) 108 (90 Base) MCG/ACT inhaler Inhale 2 puffs into the lungs every 6 (  six) hours as needed for wheezing or shortness of breath.    [provider]  benzonatate (TESSALON) 100 MG capsule Take 1 capsule (100 mg total) by mouth every 8 (eight) hours. 07/23/20   Petrucelli, Samantha R, PA-C  bisacodyl (DULCOLAX) 10 MG suppository If not relieved by MOM, give 10 mg Bisacodyl suppositiory rectally X 1 dose in 24 hours as needed (Do not use constipation standing orders for residents with renal failure/CFR less than 30. Contact MD for orders) (Physician Order) 08/24/19   [provider]  buPROPion (WELLBUTRIN XL) 150 MG 24 hr tablet Take 150 mg by mouth every morning. 05/22/20   [provider]  Calcium Carbonate-Vitamin D3 600-400 MG-UNIT TABS Take 2 tablets by mouth daily.    [provider]  cyanocobalamin 500 MCG tablet Take 500 mcg by mouth daily after breakfast. Vitamin B12    [provider]  famotidine (PEPCID) 10 MG tablet Take 1 tablet (10 mg total) by mouth 2 (two) times daily as needed for heartburn or indigestion. 07/23/20   Petrucelli, Glynda Jaeger, PA-C  folic acid (FOLVITE) 485 MCG tablet Take 400 mcg by mouth daily after breakfast.     [provider]  isosorbide dinitrate (ISORDIL) 30 MG tablet Take 30 mg by mouth daily.    [provider]  Menthol, Topical Analgesic,  (ICY HOT) 7.5 % (Roll) MISC Apply 1 each topically daily as needed (arthritis pain).    [provider]  metoprolol succinate (TOPROL-XL) 25 MG 24 hr tablet Take 25 mg by mouth daily.    [provider]  Multiple Vitamins-Minerals (PRESERVISION AREDS) TABS Take 1 tablet by mouth daily.    [provider]  NON FORMULARY Heart healthy diet 08/24/19   [provider]  ondansetron (ZOFRAN ODT) 4 MG disintegrating tablet Take 1 tablet (4 mg total) by mouth every 8 (eight) hours as needed for nausea or vomiting. 07/15/20   Joy, Shawn C, PA-C  ondansetron (ZOFRAN) 4 MG tablet Take 4 mg by mouth every 8 (eight) hours as needed for nausea or vomiting.    [provider]  rosuvastatin (CRESTOR) 20 MG tablet Take 1 tablet (20 mg total) by mouth daily. 09/09/18   Frann Rider, NP  Sodium Phosphates (RA SALINE ENEMA RE) Place rectally. If not relieved by Biscodyl suppository, give disposable Saline Enema rectally X 1 dose/24 hrs as needed (Do not use constipation standing orders for residents with renal failure/CFR less than 30. Contact MD for orders)(Physician Or 08/24/19   [provider]    Allergies    Penicillins, Brovana [arformoterol], and Paxil [paroxetine hcl]  Review of Systems   Review of Systems  Physical Exam Updated Vital Signs BP (!) 117/54   Pulse (!) 52   Temp 97.9 F (36.6 C) (Oral)   Resp (!) 25   SpO2 99%   Physical Exam Vitals reviewed.  Constitutional:      Appearance: Normal appearance.  HENT:     Head: Normocephalic and atraumatic.     Comments: No of hemotympanum, raccoon eyes, battle sign.  No mastoid tenderness.  No malocclusion.  No evidence of lacerations, cranial deformities. Full range of motion of head and neck    Eyes:     General:        Right eye: No discharge.        Left eye: No discharge.     Extraocular Movements: Extraocular movements intact.     Conjunctiva/sclera: Conjunctivae normal.   Cardiovascular:  Rate and Rhythm: Normal rate and regular rhythm.     Pulses: Normal pulses.     Heart sounds: Normal heart sounds.  Abdominal:     General: Abdomen is flat.     Tenderness: There is no abdominal tenderness.  Musculoskeletal:        General: Tenderness present. No swelling. Normal range of motion.     Comments: Bony tenderness over the right ASIS/iliac crest of the pelvis.  No visible leg shortening, internal or external rotation.  Full range of motion of the right left hip, with 5/5 strength in upper and lower extremities bilaterally.  No midline tenderness to the C, T,  mild L-spine tenderness.  Mild right flank/musculature tenderness. No visible deformities to the rib cage bilaterally, no flail chest.   Neurological:     General: No focal deficit present.     Mental Status: She is alert and oriented to person, place, and time.  Psychiatric:        Mood and Affect: Mood normal.        Behavior: Behavior normal.    ED Results / Procedures / Treatments   Labs (all labs ordered are listed, but only abnormal results are displayed) Labs Reviewed  CBC WITH DIFFERENTIAL/PLATELET - Abnormal; Notable for the following components:      Result Value   Neutro Abs 8.0 (*)    Lymphs Abs 0.4 (*)    Abs Immature Granulocytes 0.10 (*)    All other components within normal limits  BASIC METABOLIC PANEL - Abnormal; Notable for the following components:   Sodium 134 (*)    Glucose, Bld 125 (*)    Creatinine, Ser 1.18 (*)    GFR, Estimated 47 (*)    All other components within normal limits  URINALYSIS, ROUTINE W REFLEX MICROSCOPIC - Abnormal; Notable for the following components:   Hgb urine dipstick MODERATE (*)    Protein, ur 30 (*)    Leukocytes,Ua TRACE (*)    All other components within normal limits    EKG EKG Interpretation  Date/Time:  Friday July 28 2020 08:06:53 EDT Ventricular Rate:  68 PR Interval:  182 QRS Duration: 97 QT Interval:  403 QTC  Calculation: 429 R Axis:   -47 Text Interpretation: Sinus rhythm Left anterior fascicular block Borderline low voltage, extremity leads Abnormal R-wave progression, late transition Nonspecific T abnormalities, lateral leads No significant change since last tracing Confirmed by Calvert Cantor [phone]) on 07/28/2020 8:11:48 AM  Radiology DG Chest 2 View  Result Date: 07/28/2020 CLINICAL DATA:  Fall with hip pain EXAM: CHEST - 2 VIEW COMPARISON:  07/22/2020 FINDINGS: Previous median sternotomy and CABG. Heart size is normal. Ordinary aortic atherosclerotic calcification. Patient has not taken a deep inspiration. Allowing for that, the lungs are clear. No pulmonary edema. No pleural effusion. Question partial compression fracture of a lower thoracic vertebral body. IMPRESSION: No active cardiopulmonary disease. Previous CABG. Question partial compression fracture of a lower thoracic vertebral body. Does the patient have back pain? Electronically Signed   By: Nelson Chimes [name]   On: 07/28/2020 07:49   DG Thoracic Spine 2 View  Result Date: 07/28/2020 CLINICAL DATA:  Fall with diffuse back pain EXAM: THORACIC SPINE 2 VIEWS COMPARISON:  None similar FINDINGS: Allowing for obliquity, no convincing compression fracture. Ordinary spondylitic changes. Diffusely faint bones due to technique and osteopenia. Maintained posterior mediastinal fat planes. IMPRESSION: No convincing fracture. Electronically Signed   By: Monte Fantasia [name]   On: 07/28/2020 09:52  DG Lumbar Spine Complete  Result Date: 07/28/2020 CLINICAL DATA:  Pain following fall EXAM: LUMBAR SPINE - COMPLETE 4+ VIEW COMPARISON:  Jul 07, 2020 FINDINGS: Frontal, lateral, spot lumbosacral lateral, and bilateral oblique views were obtained. There are 5 non-rib-bearing lumbar type vertebral bodies. There is stable lumbar levoscoliosis. There is no fracture or spondylolisthesis. There is disc space narrowing at L3-4, L4-5, and L5-S1. There is facet  osteoarthritic change at L3-4, L4-5, and L5-S1 bilaterally. Degenerative type changes in the endplates at L3, L4, and L5 remains stable. There is aortic atherosclerosis. IMPRESSION: Osteoarthritic change at L3-4, L4-5, and L5-S1 remains stable. No fracture or spondylolisthesis. Mild scoliosis. Aortic Atherosclerosis (ICD10-I70.0). Electronically Signed   By: Lowella Grip III [name]   On: 07/28/2020 08:20   DG Hip Unilat W or Wo Pelvis 2-3 Views Left  Result Date: 07/28/2020 CLINICAL DATA:  Fall with bilateral hip pain EXAM: DG HIP (WITH OR WITHOUT PELVIS) 2-3V LEFT COMPARISON:  None. FINDINGS: There is no evidence of hip fracture or dislocation. There is no evidence of arthropathy or other focal bone abnormality. Pelvis in general does not show any fracture or focal lesion. IMPRESSION: Negative. Electronically Signed   By: Nelson Chimes [name]   On: 07/28/2020 07:48   DG Hip Unilat W or Wo Pelvis 2-3 Views Right  Result Date: 07/28/2020 CLINICAL DATA:  Fall with bilateral hip pain EXAM: DG HIP (WITH OR WITHOUT PELVIS) 2-3V RIGHT COMPARISON:  None. FINDINGS: There is no evidence of hip fracture or dislocation. There is no evidence of arthropathy or other focal bone abnormality. Vascular clips incidentally noted. IMPRESSION: Negative. Electronically Signed   By: Nelson Chimes [name]   On: 07/28/2020 07:47    Procedures Procedures   Medications Ordered in ED Medications  acetaminophen (TYLENOL) tablet 650 mg (650 mg Oral Given 07/28/20 4696)    ED Course  I have reviewed the triage vital signs and the nursing notes.  Pertinent labs & imaging results that were available during my care of the patient were reviewed by me and considered in my medical decision making (see chart for details).  Clinical Course as of 07/28/20 1450  Fri Jul 28, 2833  150 80 year old female who presents to the ER with complaints of a fall.  On arrival, she is weak appearing, however nontoxic, no acute distress, resting  comfortably in ER bed.  Vitals overall reassuring.  No evidence of hypoxia.  Physical exam with no visible deformities, no leg shortening, and internal or external rotation.  She does have some bony tenderness over the right iliac crest and ASIS.  No midline tenderness to the C, T, L-spine.  No visible deformities to the right chest wall, some mild tenderness to the paraspinal musculature of the lumbar spine. [MB]  0808 Plain films of the right and left hip negative.  Chest x-ray with questionable lower thoracic compression fracture [MB]  0844 On re-evaluation, patient is complaining of mid back pain. Did not complain of that at presentation. Plan for thoracic plain films  [MB]  0908 UA with moderate hemoglobin but no RBCs.  Not consistent with UTI, patient recently treated for UTI [MB]  [phone] Thoracic plain films without concrete fracture. [MB]  [id number] Plan for PT eval, transition of care consult for placement [MB]  1447 Pt to board until SNF placement. Pharmacy tech to review meds  [MB]    Clinical Course User Index [MB] Lyndel Safe   MDM Rules/Calculators/A&P  Final Clinical Impression(s) / ED Diagnoses Final diagnoses:  Fall, initial encounter    Rx / DC Orders ED Discharge Orders     None        Lyndel Safe 07/28/20 1450    Truddie Hidden, MD 07/31/20 1506

## 2020-07-28 NOTE — ED Triage Notes (Signed)
Pt arrives from home via EMS. Pt has been falling frequently and having difficulty taking care of home. DX Covid two weeks ago. Pt feel today on the right side of her back, reporting right back pain. No LOC.

## 2020-07-28 NOTE — ED Notes (Signed)
No urine sample in canister at this time.

## 2020-07-28 NOTE — NC FL2 (Signed)
[place] LEVEL OF CARE SCREENING TOOL     IDENTIFICATION  Patient Name: Nicole Meyer Birthdate: [birth year] Sex: female Admission Date (Current Location): 07/28/2020  [facility] and [place] Number:  Herbalist and Address:  [facility],  [place], [place]      Provider Number: [id number]  Attending Physician Name and Address:  Default, Provider, MD  Relative Name and Phone Number:  Haleema Vanderheyden #[phone]    Current Level of Care: Hospital Recommended Level of Care: [place] Prior Approval Number:    Date Approved/Denied:   PASRR Number: [id number]  Discharge Plan: SNF    Current Diagnoses: Patient Active Problem List   Diagnosis Date Noted   Physical deconditioning 09/07/2019   Right calf pain 09/07/2019   Hypoxemia 09/07/2019   Coronary artery disease 08/17/2019   Atherosclerosis of coronary artery of native heart with unstable angina pectoris ([place]) 08/12/2019   Unstable angina ([place]) 08/10/2019   PAD (peripheral artery disease) ([place]) 02/28/2018   Lumbar stenosis 02/28/2018   Chronic anemia 02/28/2018   Acute CVA (cerebrovascular accident) ([place]) 02/27/2018   S/P total knee replacement using cement 05/19/2015   Chronic joint pain 08/30/2013   Serum total bilirubin elevated 05/03/2013   Abdominal pain 01/03/2013   AKI (acute kidney injury) ([place]) 01/03/2013   Protein-calorie malnutrition, moderate ([place]) 29/93/7169   Umbilical hernia s/p primary repair 04/08/2012 03/30/2012   Obesity (BMI 30-39.9) 01/08/2012   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 04/08/2012 12/26/2011   UTI (urinary tract infection) 12/26/2011   Chest pain, noncardiac - probably due to giant hiatal hernia 12/25/2011   COPD (chronic obstructive pulmonary disease) ([place]) 12/24/2011   Lymphopenia 08/23/2011   Hemolytic anemia (HCC)    Leukocytopenia    HTN (hypertension)    Asthma    IBS (irritable bowel  syndrome)    Arthritis    Anxiety    Chronic kidney disease (CKD), stage III (moderate) (HCC)    Chronic fatigue     Orientation RESPIRATION BLADDER Height & Weight     Self, Time, Situation, Place  Normal Continent Weight:  65 kg Height:   5'2"  BEHAVIORAL SYMPTOMS/MOOD NEUROLOGICAL BOWEL NUTRITION STATUS      Continent Diet  AMBULATORY STATUS COMMUNICATION OF NEEDS Skin   Extensive Assist Verbally Normal                       Personal Care Assistance Level of Assistance  Bathing, Feeding, Dressing Bathing Assistance: Limited assistance Feeding assistance: Limited assistance Dressing Assistance: Maximum assistance     Functional Limitations Info  Sight, Hearing, Speech Sight Info: Adequate Hearing Info: Adequate Speech Info: Adequate    SPECIAL CARE FACTORS FREQUENCY  PT (By licensed PT), OT (By licensed OT)     PT Frequency: 5x per week OT Frequency: 5x per week            Contractures Contractures Info: Present    Additional Factors Info  Code Status, Allergies Code Status Info: Full Code Allergies Info: Penicillins, Brovana           Current Medications (07/28/2020):  This is the current hospital active medication list No current facility-administered medications for this encounter.   Current Outpatient Medications  Medication Sig Dispense Refill   acetaminophen (TYLENOL) 325 MG tablet Take 650 mg by mouth every 6 (six) hours as needed for mild pain or moderate pain.     albuterol (PROVENTIL HFA;VENTOLIN HFA)  108 (90 Base) MCG/ACT inhaler Inhale 2 puffs into the lungs every 6 (six) hours as needed for wheezing or shortness of breath.     benzonatate (TESSALON) 100 MG capsule Take 1 capsule (100 mg total) by mouth every 8 (eight) hours. 21 capsule 0   bisacodyl (DULCOLAX) 10 MG suppository If not relieved by MOM, give 10 mg Bisacodyl suppositiory rectally X 1 dose in 24 hours as needed (Do not use constipation standing orders for residents with  renal failure/CFR less than 30. Contact MD for orders) (Physician Order)     buPROPion (WELLBUTRIN XL) 150 MG 24 hr tablet Take 150 mg by mouth every morning.     Calcium Carbonate-Vitamin D3 600-400 MG-UNIT TABS Take 2 tablets by mouth daily.     cyanocobalamin 500 MCG tablet Take 500 mcg by mouth daily after breakfast. Vitamin B12     famotidine (PEPCID) 10 MG tablet Take 1 tablet (10 mg total) by mouth 2 (two) times daily as needed for heartburn or indigestion. 20 tablet 0   folic acid (FOLVITE) 791 MCG tablet Take 400 mcg by mouth daily after breakfast.      isosorbide dinitrate (ISORDIL) 30 MG tablet Take 30 mg by mouth daily.     Menthol, Topical Analgesic, (ICY HOT) 7.5 % (Roll) MISC Apply 1 each topically daily as needed (arthritis pain).     metoprolol succinate (TOPROL-XL) 25 MG 24 hr tablet Take 25 mg by mouth daily.     Multiple Vitamins-Minerals (PRESERVISION AREDS) TABS Take 1 tablet by mouth daily.     NON FORMULARY Heart healthy diet     ondansetron (ZOFRAN ODT) 4 MG disintegrating tablet Take 1 tablet (4 mg total) by mouth every 8 (eight) hours as needed for nausea or vomiting. 20 tablet 0   ondansetron (ZOFRAN) 4 MG tablet Take 4 mg by mouth every 8 (eight) hours as needed for nausea or vomiting.     rosuvastatin (CRESTOR) 20 MG tablet Take 1 tablet (20 mg total) by mouth daily. 30 tablet 3   Sodium Phosphates (RA SALINE ENEMA RE) Place rectally. If not relieved by Biscodyl suppository, give disposable Saline Enema rectally X 1 dose/24 hrs as needed (Do not use constipation standing orders for residents with renal failure/CFR less than 30. Contact MD for orders)(Physician Or       Discharge Medications: Please see discharge summary for a list of discharge medications.  Relevant Imaging Results:  Relevant Lab Results:   Additional Information SSN# [id number]  Dillion Stowers, Rexene Alberts, RN

## 2020-07-29 NOTE — Progress Notes (Signed)
.  Transition of Care [facility]) - Emergency Department Mini Assessment   Patient Details  Name: Nicole Meyer MRN: [id number] Date of Birth: 1940-02-20  Transition of Care [facility]) CM/SW Contact:    Erenest Rasher, RN Phone Number: [phone] 07/29/2020, 7:59 AM   Clinical Narrative: TOC CM spoke to pt husband and states pt has been to [facility] for rehab. He would like for her to go to [facility] . Agreeable to [facility] and fax referral to SNF rehab. Pt has received her Covid vaccine. Will need COVID test prior to dc to SNF. Faxed referral waiting bed offers and will start [place].    ED Mini Assessment: What brought you to the Emergency Department? : falls  Barriers to Discharge: SNF Pending bed offer  Barrier interventions: SNF rehab will need insurance auth and bed offer  Means of departure: Not know  Interventions which prevented an admission or readmission: SNF Placement    Patient Contact and Communications Key Contact 1: Cynde Menard husband   Spoke with: husband Contact Date: 07/28/20,   Contact time: 33 Contact Phone Number: [phone] Call outcome: Pt is in need of a SNF to assist with care.  Husband lives in the house with pt, but unable to care for her due to his age.  Patient states their goals for this hospitalization and ongoing recovery are:: Pt would like to go to SNF. CMS Medicare.gov Compare Post Acute Care list provided to:: Patient Represenative (must comment) (husband-Paul) Choice offered to / list presented to : Spouse  Admission diagnosis:  Back Pain Patient Active Problem List   Diagnosis Date Noted   Physical deconditioning 09/07/2019   Right calf pain 09/07/2019   Hypoxemia 09/07/2019   Coronary artery disease 08/17/2019   Atherosclerosis of coronary artery of native heart with unstable angina pectoris ([place]) 08/12/2019   Unstable angina ([place]) 08/10/2019   PAD (peripheral artery disease) ([employer]) 02/28/2018   Lumbar stenosis 02/28/2018    Chronic anemia 02/28/2018   Acute CVA (cerebrovascular accident) ([place]) 02/27/2018   S/P total knee replacement using cement 05/19/2015   Chronic joint pain 08/30/2013   Serum total bilirubin elevated 05/03/2013   Abdominal pain 01/03/2013   AKI (acute kidney injury) ([place]) 01/03/2013   Protein-calorie malnutrition, moderate ([place]) 87/86/7672   Umbilical hernia s/p primary repair 04/08/2012 03/30/2012   Obesity (BMI 30-39.9) 01/08/2012   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 04/08/2012 12/26/2011   UTI (urinary tract infection) 12/26/2011   Chest pain, noncardiac - probably due to giant hiatal hernia 12/25/2011   COPD (chronic obstructive pulmonary disease) ([place]) 12/24/2011   Lymphopenia 08/23/2011   Hemolytic anemia (HCC)    Leukocytopenia    HTN (hypertension)    Asthma    IBS (irritable bowel syndrome)    Arthritis    Anxiety    Chronic kidney disease (CKD), stage III (moderate) ([place])    Chronic fatigue    PCP:  [name], MD Pharmacy:   [facility] [address] [place] [address] Phone: [phone] Fax: [phone]

## 2020-07-29 NOTE — Progress Notes (Signed)
CSW contacted several SNFs either by phone or text.  [facility] will have a bed available on Monday.  [facility] -requested that they review Pt in hub.  [facility]-declined due to lack of booster/recent Covid diagnosis.  [facility]- will review tomorrow.   Pt is still waiting for insurance authorization that was initiated on Friday by RNCM.  CSW met with Pt at bedside and updated about SNF bed process.

## 2020-07-30 ENCOUNTER — Encounter (HOSPITAL_COMMUNITY): Payer: Self-pay | Admitting: Emergency Medicine

## 2020-07-30 NOTE — Progress Notes (Addendum)
07/30/2020  1036  Paged PT [phone] is see when they would be able to come and evaluate patient. Spoke with Yong Channel, PT he states he saw patient on 6/10 and note is in.

## 2020-07-30 NOTE — Progress Notes (Signed)
CSW spoke with patient and updated her about going to [facility]. CSW let patient know her insurance is reviewing her information. Patient stated she had covid 2 weeks ago. CSW is unsure if provider will order a covid test tomorrow with patient having covid two weeks ago.

## 2020-07-30 NOTE — Progress Notes (Signed)
CSW contacted [facility] to follow-up on insurance authorization. CSW was told that they do no see anywhere in the system that an authorization was started. CSW started authorization and was provided with a reference number. [id number]). CSW also faxed over clinicals to [phone] for review.

## 2020-07-30 NOTE — ED Notes (Signed)
Patient cleaned, new sheets, new brief.

## 2020-07-30 NOTE — Progress Notes (Signed)
CSW contacted [facility] to see if they had a chance to review patients referral. CSW spoke with April in admissions and she stated that patient should be able to come tomorrow based on insurance authorization approval. CSW stated the authorization was started Friday per the notes in epic. CSW stated that she would call [employer] to see if patient was approved. April stated patient will also need a covid test but advised CSW not to put one in now because the test has to be within 24 hours. CSW stated that she would leave a note for the social worker tomorrow about patient bed offer.

## 2020-07-30 NOTE — ED Notes (Signed)
Pt. Received breakfast tray. Nurse aware.

## 2020-07-30 NOTE — Progress Notes (Signed)
07/30/2020  0945  Patient holding for placement. Asked MD if patient will need a PT consult for placement.

## 2020-07-31 DIAGNOSIS — W19XXXA Unspecified fall, initial encounter: Secondary | ICD-10-CM | POA: Diagnosis not present

## 2020-07-31 DIAGNOSIS — R0902 Hypoxemia: Secondary | ICD-10-CM | POA: Diagnosis not present

## 2020-07-31 DIAGNOSIS — M545 Low back pain, unspecified: Secondary | ICD-10-CM | POA: Diagnosis not present

## 2020-07-31 DIAGNOSIS — S7001XA Contusion of right hip, initial encounter: Secondary | ICD-10-CM | POA: Diagnosis not present

## 2020-07-31 DIAGNOSIS — Z743 Need for continuous supervision: Secondary | ICD-10-CM | POA: Diagnosis not present

## 2020-07-31 DIAGNOSIS — J45909 Unspecified asthma, uncomplicated: Secondary | ICD-10-CM | POA: Diagnosis not present

## 2020-07-31 DIAGNOSIS — R531 Weakness: Secondary | ICD-10-CM | POA: Diagnosis not present

## 2020-07-31 DIAGNOSIS — M24551 Contracture, right hip: Secondary | ICD-10-CM | POA: Diagnosis not present

## 2020-07-31 DIAGNOSIS — I25119 Atherosclerotic heart disease of native coronary artery with unspecified angina pectoris: Secondary | ICD-10-CM | POA: Diagnosis not present

## 2020-07-31 DIAGNOSIS — R0602 Shortness of breath: Secondary | ICD-10-CM | POA: Diagnosis not present

## 2020-07-31 DIAGNOSIS — N390 Urinary tract infection, site not specified: Secondary | ICD-10-CM | POA: Diagnosis not present

## 2020-07-31 DIAGNOSIS — U071 COVID-19: Secondary | ICD-10-CM | POA: Diagnosis not present

## 2020-07-31 DIAGNOSIS — R7989 Other specified abnormal findings of blood chemistry: Secondary | ICD-10-CM | POA: Diagnosis not present

## 2020-07-31 DIAGNOSIS — M25551 Pain in right hip: Secondary | ICD-10-CM | POA: Diagnosis not present

## 2020-07-31 DIAGNOSIS — I700 Atherosclerosis of aorta: Secondary | ICD-10-CM | POA: Diagnosis not present

## 2020-07-31 DIAGNOSIS — I129 Hypertensive chronic kidney disease with stage 1 through stage 4 chronic kidney disease, or unspecified chronic kidney disease: Secondary | ICD-10-CM | POA: Diagnosis not present

## 2020-07-31 DIAGNOSIS — N183 Chronic kidney disease, stage 3 unspecified: Secondary | ICD-10-CM | POA: Diagnosis not present

## 2020-07-31 DIAGNOSIS — J449 Chronic obstructive pulmonary disease, unspecified: Secondary | ICD-10-CM | POA: Diagnosis not present

## 2020-07-31 DIAGNOSIS — R062 Wheezing: Secondary | ICD-10-CM | POA: Diagnosis not present

## 2020-07-31 DIAGNOSIS — Z85828 Personal history of other malignant neoplasm of skin: Secondary | ICD-10-CM | POA: Diagnosis not present

## 2020-07-31 DIAGNOSIS — R0689 Other abnormalities of breathing: Secondary | ICD-10-CM | POA: Diagnosis not present

## 2020-07-31 LAB — RESP PANEL BY RT-PCR (FLU A&B, COVID) ARPGX2
Influenza A by PCR: NEGATIVE
Influenza B by PCR: NEGATIVE
SARS Coronavirus 2 by RT PCR: POSITIVE — AB

## 2020-07-31 NOTE — Progress Notes (Signed)
TOC CSW attempted to contact Tammy/[facility] [phone].  CSW left HIPPA compliant message with my contact information.    Vianny Schraeder Tarpley-Carter, MSW, LCSW-A Pronouns:  She, Her, Hers                  [place] ED Transitions of CareClinical Social Worker [email or web address] [phone]

## 2020-07-31 NOTE — Progress Notes (Signed)
TOC CSW received a call from [facility] with the following information for approval.  Auth #:  [id number]  Reference #:  [id number]    Start Date:  07/31/2020  Review Date:  08/02/2020  Reviewer:  Fredric Mare  Fax #:  [phone]  Glendell Fouse Tarpley-Carter, MSW, LCSW-A Pronouns:  She, Her, Satira Mccallum ED Transitions of CareClinical Social Worker [email or web address] [phone]

## 2020-07-31 NOTE — Progress Notes (Signed)
Date and time results received: 07/31/20 1307 (use smartphrase ".now" to insert current time)  Test: COVID Critical Value: POSITIVE  Name of Provider Notified: ALLEN  Orders Received? Or Actions Taken?:

## 2020-07-31 NOTE — Progress Notes (Addendum)
07/31/2020  1315  Spoke with Ricquita to inform her that patient COVID test came back Positive. Waiting to hear back if patient can still be transported to [facility]. Ricquita called back and stated that since patient was positive on 5/28 [facility] is still willing to take patient.

## 2020-07-31 NOTE — Progress Notes (Signed)
07/31/2020  1233  Called report to [facility] [phone]. Report given to [facility].

## 2020-07-31 NOTE — Progress Notes (Signed)
TOC CSW attempted to contact [facility] [phone].  CSW left HIPPA compliant message with my contact information.   Jeromy Borcherding Tarpley-Carter, MSW, LCSW-A Pronouns:  She, Her, Hers                  [place] ED Transitions of CareClinical Social Worker [email or web address] [phone]

## 2020-07-31 NOTE — ED Notes (Addendum)
Patient was given her lunch tray. 

## 2020-08-01 DIAGNOSIS — W19XXXA Unspecified fall, initial encounter: Secondary | ICD-10-CM | POA: Diagnosis not present

## 2020-08-01 DIAGNOSIS — M24551 Contracture, right hip: Secondary | ICD-10-CM | POA: Diagnosis not present

## 2020-08-01 DIAGNOSIS — J449 Chronic obstructive pulmonary disease, unspecified: Secondary | ICD-10-CM | POA: Diagnosis not present

## 2020-08-01 DIAGNOSIS — S7001XA Contusion of right hip, initial encounter: Secondary | ICD-10-CM | POA: Diagnosis not present

## 2020-08-01 DIAGNOSIS — I700 Atherosclerosis of aorta: Secondary | ICD-10-CM | POA: Diagnosis not present

## 2020-08-19 DIAGNOSIS — I700 Atherosclerosis of aorta: Secondary | ICD-10-CM | POA: Diagnosis not present

## 2020-08-19 DIAGNOSIS — J449 Chronic obstructive pulmonary disease, unspecified: Secondary | ICD-10-CM | POA: Diagnosis not present

## 2020-08-19 DIAGNOSIS — M24551 Contracture, right hip: Secondary | ICD-10-CM | POA: Diagnosis not present

## 2020-08-19 DIAGNOSIS — S7001XA Contusion of right hip, initial encounter: Secondary | ICD-10-CM | POA: Diagnosis not present

## 2021-04-18 DEATH — deceased

## 2021-10-23 ENCOUNTER — Encounter: Payer: Self-pay | Admitting: Cardiovascular Disease
